# Patient Record
Sex: Female | Born: 1955
Health system: Southern US, Community
[De-identification: ages and names within clinical notes are randomized; demographics above are authoritative.]

## PROBLEM LIST (undated history)

## (undated) DIAGNOSIS — F329 Major depressive disorder, single episode, unspecified: Secondary | ICD-10-CM

## (undated) DIAGNOSIS — R51 Headache: Secondary | ICD-10-CM

## (undated) DIAGNOSIS — Z9889 Other specified postprocedural states: Secondary | ICD-10-CM

## (undated) DIAGNOSIS — F419 Anxiety disorder, unspecified: Secondary | ICD-10-CM

## (undated) DIAGNOSIS — Z9861 Coronary angioplasty status: Principal | ICD-10-CM

## (undated) DIAGNOSIS — Z8489 Family history of other specified conditions: Secondary | ICD-10-CM

## (undated) DIAGNOSIS — J381 Polyp of vocal cord and larynx: Secondary | ICD-10-CM

## (undated) DIAGNOSIS — E785 Hyperlipidemia, unspecified: Secondary | ICD-10-CM

## (undated) DIAGNOSIS — T4145XA Adverse effect of unspecified anesthetic, initial encounter: Secondary | ICD-10-CM

## (undated) DIAGNOSIS — R112 Nausea with vomiting, unspecified: Secondary | ICD-10-CM

## (undated) DIAGNOSIS — K219 Gastro-esophageal reflux disease without esophagitis: Secondary | ICD-10-CM

## (undated) DIAGNOSIS — G4700 Insomnia, unspecified: Secondary | ICD-10-CM

## (undated) DIAGNOSIS — T8859XA Other complications of anesthesia, initial encounter: Secondary | ICD-10-CM

## (undated) DIAGNOSIS — I2510 Atherosclerotic heart disease of native coronary artery without angina pectoris: Secondary | ICD-10-CM

## (undated) DIAGNOSIS — M1990 Unspecified osteoarthritis, unspecified site: Secondary | ICD-10-CM

## (undated) DIAGNOSIS — R519 Headache, unspecified: Secondary | ICD-10-CM

## (undated) DIAGNOSIS — D509 Iron deficiency anemia, unspecified: Secondary | ICD-10-CM

## (undated) DIAGNOSIS — G629 Polyneuropathy, unspecified: Secondary | ICD-10-CM

## (undated) DIAGNOSIS — I10 Essential (primary) hypertension: Secondary | ICD-10-CM

## (undated) DIAGNOSIS — I2119 ST elevation (STEMI) myocardial infarction involving other coronary artery of inferior wall: Secondary | ICD-10-CM

## (undated) HISTORY — DX: Insomnia, unspecified: G47.00

## (undated) HISTORY — DX: Essential (primary) hypertension: I10

## (undated) HISTORY — DX: Hyperlipidemia, unspecified: E78.5

## (undated) HISTORY — DX: ST elevation (STEMI) myocardial infarction involving other coronary artery of inferior wall: I21.19

## (undated) HISTORY — PX: BACK SURGERY: SHX140

## (undated) HISTORY — DX: Polyp of vocal cord and larynx: J38.1

## (undated) HISTORY — PX: JOINT REPLACEMENT: SHX530

## (undated) HISTORY — DX: Coronary angioplasty status: Z98.61

## (undated) HISTORY — PX: ABDOMINAL HYSTERECTOMY: SHX81

## (undated) HISTORY — DX: Atherosclerotic heart disease of native coronary artery without angina pectoris: I25.10

## (undated) HISTORY — PX: EYE SURGERY: SHX253

## (undated) HISTORY — PX: BREAST EXCISIONAL BIOPSY: SUR124

---

## 1998-03-25 HISTORY — PX: CHOLECYSTECTOMY: SHX55

## 1999-01-31 HISTORY — PX: BREAST BIOPSY: SHX20

## 1999-09-11 HISTORY — PX: OTHER SURGICAL HISTORY: SHX169

## 2000-07-04 HISTORY — PX: APPENDECTOMY: SHX54

## 2003-02-22 HISTORY — PX: UPPER GI ENDOSCOPY: SHX6162

## 2003-04-10 DIAGNOSIS — J381 Polyp of vocal cord and larynx: Secondary | ICD-10-CM

## 2003-04-10 HISTORY — DX: Polyp of vocal cord and larynx: J38.1

## 2003-04-28 ENCOUNTER — Other Ambulatory Visit: Payer: Self-pay

## 2004-01-12 ENCOUNTER — Ambulatory Visit: Payer: Self-pay | Admitting: General Surgery

## 2004-01-14 ENCOUNTER — Ambulatory Visit: Payer: Self-pay | Admitting: General Surgery

## 2004-09-07 ENCOUNTER — Ambulatory Visit: Payer: Self-pay | Admitting: Physician Assistant

## 2004-09-20 ENCOUNTER — Other Ambulatory Visit: Payer: Self-pay

## 2004-09-21 ENCOUNTER — Inpatient Hospital Stay: Payer: Self-pay | Admitting: Unknown Physician Specialty

## 2005-01-10 ENCOUNTER — Ambulatory Visit: Payer: Self-pay | Admitting: General Surgery

## 2006-01-11 ENCOUNTER — Ambulatory Visit: Payer: Self-pay | Admitting: General Surgery

## 2007-02-11 ENCOUNTER — Ambulatory Visit: Payer: Self-pay | Admitting: General Surgery

## 2007-11-03 ENCOUNTER — Emergency Department: Payer: Self-pay | Admitting: Emergency Medicine

## 2007-11-04 ENCOUNTER — Emergency Department: Payer: Self-pay | Admitting: Emergency Medicine

## 2008-02-11 ENCOUNTER — Ambulatory Visit: Payer: Self-pay | Admitting: General Surgery

## 2008-04-09 HISTORY — PX: KNEE SURGERY: SHX244

## 2008-12-14 DIAGNOSIS — IMO0002 Reserved for concepts with insufficient information to code with codable children: Secondary | ICD-10-CM | POA: Insufficient documentation

## 2008-12-14 DIAGNOSIS — M9979 Connective tissue and disc stenosis of intervertebral foramina of abdomen and other regions: Secondary | ICD-10-CM | POA: Insufficient documentation

## 2009-01-05 DIAGNOSIS — E7800 Pure hypercholesterolemia, unspecified: Secondary | ICD-10-CM | POA: Insufficient documentation

## 2009-01-05 DIAGNOSIS — I10 Essential (primary) hypertension: Secondary | ICD-10-CM | POA: Insufficient documentation

## 2009-01-05 DIAGNOSIS — K219 Gastro-esophageal reflux disease without esophagitis: Secondary | ICD-10-CM | POA: Insufficient documentation

## 2009-01-05 DIAGNOSIS — Z0000 Encounter for general adult medical examination without abnormal findings: Secondary | ICD-10-CM | POA: Insufficient documentation

## 2009-02-11 ENCOUNTER — Ambulatory Visit: Payer: Self-pay | Admitting: General Surgery

## 2009-02-28 ENCOUNTER — Ambulatory Visit: Payer: Self-pay | Admitting: Unknown Physician Specialty

## 2009-03-07 DIAGNOSIS — L080 Pyoderma: Secondary | ICD-10-CM | POA: Insufficient documentation

## 2009-03-10 ENCOUNTER — Ambulatory Visit: Payer: Self-pay | Admitting: Unknown Physician Specialty

## 2009-03-17 ENCOUNTER — Ambulatory Visit: Payer: Self-pay | Admitting: Unknown Physician Specialty

## 2009-09-19 DIAGNOSIS — L03319 Cellulitis of trunk, unspecified: Secondary | ICD-10-CM | POA: Insufficient documentation

## 2009-10-15 DIAGNOSIS — B999 Unspecified infectious disease: Secondary | ICD-10-CM | POA: Insufficient documentation

## 2010-04-09 DIAGNOSIS — E785 Hyperlipidemia, unspecified: Secondary | ICD-10-CM

## 2010-04-09 HISTORY — DX: Hyperlipidemia, unspecified: E78.5

## 2011-03-07 ENCOUNTER — Other Ambulatory Visit: Payer: Self-pay

## 2011-03-07 ENCOUNTER — Encounter: Payer: Self-pay | Admitting: *Deleted

## 2011-03-07 ENCOUNTER — Inpatient Hospital Stay (HOSPITAL_COMMUNITY)
Admission: EM | Admit: 2011-03-07 | Discharge: 2011-03-11 | DRG: 853 | Disposition: A | Discharge status: Home / Self Care | Payer: BC Managed Care – PPO | Source: Ambulatory Visit | Attending: Cardiology | Admitting: Cardiology

## 2011-03-07 ENCOUNTER — Emergency Department (HOSPITAL_COMMUNITY): Payer: BC Managed Care – PPO

## 2011-03-07 ENCOUNTER — Encounter (HOSPITAL_COMMUNITY)
Admission: EM | Disposition: A | Discharge status: Home / Self Care | Payer: Self-pay | Source: Ambulatory Visit | Attending: Cardiology

## 2011-03-07 DIAGNOSIS — I2510 Atherosclerotic heart disease of native coronary artery without angina pectoris: Secondary | ICD-10-CM | POA: Diagnosis present

## 2011-03-07 DIAGNOSIS — I2119 ST elevation (STEMI) myocardial infarction involving other coronary artery of inferior wall: Principal | ICD-10-CM | POA: Diagnosis present

## 2011-03-07 DIAGNOSIS — F32A Depression, unspecified: Secondary | ICD-10-CM | POA: Diagnosis present

## 2011-03-07 DIAGNOSIS — I10 Essential (primary) hypertension: Secondary | ICD-10-CM | POA: Diagnosis present

## 2011-03-07 DIAGNOSIS — F329 Major depressive disorder, single episode, unspecified: Secondary | ICD-10-CM | POA: Diagnosis present

## 2011-03-07 DIAGNOSIS — Z862 Personal history of diseases of the blood and blood-forming organs and certain disorders involving the immune mechanism: Secondary | ICD-10-CM | POA: Diagnosis present

## 2011-03-07 DIAGNOSIS — E785 Hyperlipidemia, unspecified: Secondary | ICD-10-CM | POA: Diagnosis present

## 2011-03-07 DIAGNOSIS — D509 Iron deficiency anemia, unspecified: Secondary | ICD-10-CM | POA: Diagnosis present

## 2011-03-07 DIAGNOSIS — I219 Acute myocardial infarction, unspecified: Secondary | ICD-10-CM | POA: Insufficient documentation

## 2011-03-07 DIAGNOSIS — F17200 Nicotine dependence, unspecified, uncomplicated: Secondary | ICD-10-CM | POA: Diagnosis present

## 2011-03-07 DIAGNOSIS — I213 ST elevation (STEMI) myocardial infarction of unspecified site: Secondary | ICD-10-CM

## 2011-03-07 DIAGNOSIS — Z23 Encounter for immunization: Secondary | ICD-10-CM

## 2011-03-07 DIAGNOSIS — F3289 Other specified depressive episodes: Secondary | ICD-10-CM | POA: Diagnosis present

## 2011-03-07 DIAGNOSIS — Z955 Presence of coronary angioplasty implant and graft: Secondary | ICD-10-CM

## 2011-03-07 HISTORY — DX: Major depressive disorder, single episode, unspecified: F32.9

## 2011-03-07 HISTORY — DX: Unspecified osteoarthritis, unspecified site: M19.90

## 2011-03-07 HISTORY — DX: Gastro-esophageal reflux disease without esophagitis: K21.9

## 2011-03-07 HISTORY — DX: Atherosclerotic heart disease of native coronary artery without angina pectoris: I25.10

## 2011-03-07 HISTORY — DX: Polyneuropathy, unspecified: G62.9

## 2011-03-07 HISTORY — PX: CORONARY ANGIOPLASTY WITH STENT PLACEMENT: SHX49

## 2011-03-07 HISTORY — DX: Anxiety disorder, unspecified: F41.9

## 2011-03-07 HISTORY — DX: Other specified postprocedural states: Z98.890

## 2011-03-07 HISTORY — PX: LEFT HEART CATHETERIZATION WITH CORONARY ANGIOGRAM: SHX5451

## 2011-03-07 HISTORY — DX: ST elevation (STEMI) myocardial infarction involving other coronary artery of inferior wall: I21.19

## 2011-03-07 HISTORY — DX: Iron deficiency anemia, unspecified: D50.9

## 2011-03-07 HISTORY — DX: Essential (primary) hypertension: I10

## 2011-03-07 SURGERY — LEFT HEART CATHETERIZATION WITH CORONARY ANGIOGRAM
Anesthesia: LOCAL

## 2011-03-07 MED ORDER — MIDAZOLAM HCL 2 MG/2ML IJ SOLN
INTRAMUSCULAR | Status: AC
  Filled 2011-03-07: qty 2

## 2011-03-07 MED ORDER — NITROGLYCERIN 0.2 MG/ML ON CALL CATH LAB
INTRAVENOUS | Status: AC
  Filled 2011-03-07: qty 1

## 2011-03-07 MED ORDER — HEPARIN SODIUM (PORCINE) 5000 UNIT/ML IJ SOLN
4000.0000 [IU] | INTRAMUSCULAR | Status: AC
  Administered 2011-03-07: 4000 [IU] via INTRAVENOUS

## 2011-03-07 MED ORDER — FENTANYL CITRATE 0.05 MG/ML IJ SOLN
INTRAMUSCULAR | Status: AC
  Filled 2011-03-07: qty 2

## 2011-03-07 MED ORDER — HEPARIN SODIUM (PORCINE) 5000 UNIT/ML IJ SOLN
INTRAMUSCULAR | Status: AC
  Filled 2011-03-07: qty 1

## 2011-03-07 MED ORDER — LIDOCAINE HCL (PF) 1 % IJ SOLN
INTRAMUSCULAR | Status: AC
  Filled 2011-03-07: qty 30

## 2011-03-07 MED ORDER — PRASUGREL HCL 10 MG PO TABS
ORAL_TABLET | ORAL | Status: AC
  Filled 2011-03-07: qty 6

## 2011-03-07 MED ORDER — BIVALIRUDIN 250 MG IV SOLR
INTRAVENOUS | Status: AC
  Filled 2011-03-07: qty 250

## 2011-03-07 MED ORDER — HEPARIN (PORCINE) IN NACL 2-0.9 UNIT/ML-% IJ SOLN
INTRAMUSCULAR | Status: AC
  Filled 2011-03-07: qty 2000

## 2011-03-07 MED ORDER — SODIUM CHLORIDE 0.9 % IV SOLN
30.0000 mL | INTRAVENOUS | Status: DC
  Administered 2011-03-07: 30 mL via INTRAVENOUS

## 2011-03-07 NOTE — ED Notes (Signed)
Pt transported to cath lab at this time.

## 2011-03-07 NOTE — H&P (Signed)
Jessica Landry is an 55 y.o. female.   Chief Complaint: Chest pain. HPI: 55 year old white, married, female presents from [facility] with STEMI.  With EKG changes II,III,AVF and reciprocal changes I, AVL, V2 & V3. Brought emergently to the cath lab for rescue PTCA/Stent.  No prior history of cardiac disease.  Chest pain began @ 2030 after feeding the dogs.  Initially diaphoretic, then  Developed nausea and SOB.   EMS was called once her significant other arrived home and the chest pain had increased in severity.    Other PMH: spinal surgery.  Past Medical History  Diagnosis Date  . Hypertension   . STEMI (ST elevation myocardial infarction) 03/07/2011  . HTN (hypertension) 03/07/2011  . Neuropathy     Past Surgical History  Procedure Date  . Cholecystectomy   . Abdominal hysterectomy     Family History  Problem Relation Age of Onset  . Heart attack Mother   . Coronary artery disease Mother    Social History:  reports that she has been smoking.  She does not have any smokeless tobacco history on file. She reports that she drinks alcohol. She reports that she does not use illicit drugs. She is to have divorce this week.  Currently lives with significant other.  No children.  Allergies:  Allergies  Allergen Reactions  . Sulfa Antibiotics Other (See Comments)    unknown    Medications Prior to Admission  Medication Dose Route Frequency Provider Last Rate Last Dose  . 0.9 %  sodium chloride infusion  30 mL Intravenous Continuous Vida Roller, MD 150 mL/hr at 03/07/11 2307 30 mL at 03/07/11 2307  . bivalirudin (ANGIOMAX) 250 MG injection           . fentaNYL (SUBLIMAZE) 0.05 MG/ML injection           . heparin 2-0.9 UNIT/ML-% infusion           . heparin injection 60 Units/kg  60 Units/kg Intravenous STAT Vida Roller, MD   4,000 Units at 03/07/11 2307  . lidocaine (XYLOCAINE) 1 % injection           . midazolam (VERSED) 2 MG/2ML injection           . nitroGLYCERIN  (NTG ON-CALL) 0.2 mg/mL injection            No current outpatient prescriptions on file as of 03/07/2011.    No results found for this or any previous visit (from the past 48 hour(s)). No results found.  ROS: General: No colds or fevers. Skin:No rashes or ulcers. HEENT: No blurred vision. Cardiovascular:No chest pain until tonight, despite regular exercise. Endocrine:no DM, No thyroid disease. Pulmonary: tobacco use, used to smoke 2ppd now down to half a pack. GI:no diarrhea, constipation, no melena. GU:No dysuria, no hematuria. ZO:XWRU pain, with spinal surgery Neuro:No syncope. PSYCH:  Hx. Of depression, treated.  Blood pressure 152/101, pulse 86, resp. rate 20, height 5\' 8"  (1.727 m), weight 79.379 kg (175 lb), SpO2 99.00%. PE:  GENERAL:Alert, oriented WF, Sleepy from medications, but pleasant affect. SKIN:W & D, brisk capillary refill. HEENT:normocephalic, sclera clear. NECK:supple, no jvd, no carotid bruits. HEART:S1S2, RRR. No obvious Murmur, gallup, rub or click.  LUNGS:clear ant., without rales or wheezes. ABD:+ BS, soft, non tender. EXT:Rt. Groin with sheath, no lower ext. Edema.  1+ pedal pulses bil. NEURO:A & O X 3, MAE, follows commands.    Assessment/Plan Patient Active Problem List  Diagnoses  .  STEMI (ST elevation myocardial infarction)  . HTN (hypertension)    PLAN:  To cath lab emergently. Will admit to 2900 A.  Serial cardiac enzymes, check CXR.  Serial EKGS.  Changed Atenolol to lopressor, is on Crestor, will continue.  Will add low dose ACE.   INGOLD,LAURA R 03/07/2011, 11:45 PM  [name] presented with acute onset lower substernal chest pain that began at ~2030hrs that she though was indigestion.  Sx got progressively worse over the next few hrs.  Called [facility] - who checked ECG on the scene - noted Inferior STEMI.  Code STEMI called.  Paged @[phone] .   Arrived @ [facility] - 22:58 --> cath lab 23:13 with >75mm STE in II,III,aVF with reciprocal  ST depressions c/w Inferior STEMI.  Upon arrival was hypertensive - ~200s/100s with HR ~110s.  10/10 chest pain & nausea. Minimal LCA disease (very small non-dominant LCx.  Mid RCA 100% occluded - PCTA - Promus DES 3.5 mm x 28 mm (post dilated to 3.75 mm distally & 4.12 proximally).  Reperfusion vasovagal response with drop in SBP to 60s & HR to 50s -- bolus given & BP in ~100s upon transfer to CCU in stable condition on Fast-track status.  The patient was examined post-procedure.  I agree with the H&P as outlined by Nada Boozer, FNP.  Marykay Lex, M.D., M.S. [facility] [address] [place], [place]  [address]  [phone]  03/08/2011 1:54 AM

## 2011-03-07 NOTE — ED Notes (Signed)
Per EMS - pt from home - c/o substernal chest pain that began 20:00 after walking her dog - pt w/o hx of cardiac hx

## 2011-03-07 NOTE — ED Provider Notes (Signed)
History     CSN: [id number] Arrival date & time: 03/07/2011 10:58 PM   First MD Initiated Contact with Patient 03/07/11 2304      Chief Complaint  Patient presents with  . Chest Pain    (Consider location/radiation/quality/duration/timing/severity/associated sxs/prior treatment) Patient is a 55 y.o. female presenting with chest pain. The history is provided by the patient and the EMS personnel.  Chest Pain The chest pain began 3 - 5 hours ago. Duration of episode(s) is 3 hours. Chest pain occurs constantly. The chest pain is improving. Associated with: nothing. The pain is currently at 7/10. The severity of the pain is moderate. The quality of the pain is described as heavy. The pain does not radiate. Exacerbated by: nothing. Primary symptoms include nausea. Pertinent negatives for primary symptoms include no shortness of breath.  Pertinent negatives for associated symptoms include no diaphoresis, no lower extremity edema, no near-syncope, no numbness, no orthopnea and no weakness. Treatments tried: EMS gave ASA, nitro, fentanyl. Risk factors: hypertension, smoking.     Past Medical History  Diagnosis Date  . Hypertension     Past Surgical History  Procedure Date  . Cholecystectomy   . Abdominal hysterectomy     Family History  Problem Relation Age of Onset  . Heart attack Mother     History  Substance Use Topics  . Smoking status: Current Some Day Smoker  . Smokeless tobacco: Not on file  . Alcohol Use: Yes     occasional    OB History    Grav Para Term Preterm Abortions TAB SAB Ect Mult Living                  Review of Systems  Constitutional: Negative for diaphoresis.  Respiratory: Negative for shortness of breath.   Cardiovascular: Positive for chest pain. Negative for orthopnea and near-syncope.  Gastrointestinal: Positive for nausea.  Neurological: Negative for weakness and numbness.  All other systems reviewed and are negative.    Allergies    Sulfa antibiotics  Home Medications  No current outpatient prescriptions on file.  There were no vitals taken for this visit.  Physical Exam  Nursing note and vitals reviewed. Constitutional: She appears well-developed and well-nourished. She appears distressed.       Uncomfortable appearing   HENT:  Head: Normocephalic and atraumatic.  Mouth/Throat: Oropharynx is clear and moist. No oropharyngeal exudate.  Eyes: Conjunctivae and EOM are normal. Pupils are equal, round, and reactive to light. Right eye exhibits no discharge. Left eye exhibits no discharge. No scleral icterus.  Neck: Normal range of motion. Neck supple. No JVD present. No thyromegaly present.  Cardiovascular: Normal rate, regular rhythm, normal heart sounds and intact distal pulses.  Exam reveals no gallop and no friction rub.   No murmur heard. Pulmonary/Chest: Effort normal and breath sounds normal. No respiratory distress. She has no wheezes. She has no rales.  Abdominal: Soft. Bowel sounds are normal. She exhibits no distension and no mass. There is no tenderness.  Musculoskeletal: Normal range of motion. She exhibits no edema and no tenderness.  Lymphadenopathy:    She has no cervical adenopathy.  Neurological: She is alert. Coordination normal.  Skin: Skin is warm and dry. No rash noted. No erythema.  Psychiatric: She has a normal mood and affect. Her behavior is normal.    ED Course  Procedures (including critical care time)  Labs Reviewed - No data to display No results found.   No diagnosis found.  MDM  STEMI - code activated from field, Cardiology has been paged prior to pt arrival - confirmed ECG on our tracings.  ASA given pta, heparin ordered, pt uncomfortable.  ED ECG REPORT   Date: 03/07/2011   Rate: 88  Rhythm: normal sinus rhythm  QRS Axis: normal  Intervals: normal  ST/T Wave abnormalities: ST elevations inferiorly  Conduction Disutrbances:none  Narrative Interpretation:   Old  EKG Reviewed: none available  Pt has left ED at this time.  totatl time in ED 11 minutes.       Vida Roller, MD 03/07/11 [phone]

## 2011-03-08 ENCOUNTER — Encounter (HOSPITAL_COMMUNITY)
Admission: EM | Disposition: A | Discharge status: Home / Self Care | Payer: Self-pay | Source: Ambulatory Visit | Attending: Cardiology

## 2011-03-08 ENCOUNTER — Encounter (HOSPITAL_COMMUNITY): Payer: Self-pay | Admitting: Cardiology

## 2011-03-08 ENCOUNTER — Other Ambulatory Visit: Payer: Self-pay

## 2011-03-08 ENCOUNTER — Inpatient Hospital Stay (HOSPITAL_COMMUNITY): Payer: BC Managed Care – PPO

## 2011-03-08 DIAGNOSIS — I2510 Atherosclerotic heart disease of native coronary artery without angina pectoris: Secondary | ICD-10-CM | POA: Diagnosis present

## 2011-03-08 DIAGNOSIS — F32A Depression, unspecified: Secondary | ICD-10-CM | POA: Diagnosis present

## 2011-03-08 DIAGNOSIS — E785 Hyperlipidemia, unspecified: Secondary | ICD-10-CM | POA: Diagnosis present

## 2011-03-08 DIAGNOSIS — Z9889 Other specified postprocedural states: Secondary | ICD-10-CM | POA: Insufficient documentation

## 2011-03-08 DIAGNOSIS — F329 Major depressive disorder, single episode, unspecified: Secondary | ICD-10-CM | POA: Diagnosis present

## 2011-03-08 HISTORY — DX: Depression, unspecified: F32.A

## 2011-03-08 HISTORY — PX: LEFT HEART CATH AND CORONARY ANGIOGRAPHY: CATH118249

## 2011-03-08 HISTORY — PX: LEFT HEART CATHETERIZATION WITH CORONARY ANGIOGRAM: SHX5451

## 2011-03-08 HISTORY — PX: CARDIAC CATHETERIZATION: SHX172

## 2011-03-08 LAB — COMPREHENSIVE METABOLIC PANEL
ALT: 15 U/L (ref 0–35)
AST: 13 U/L (ref 0–37)
AST: 70 U/L — ABNORMAL HIGH (ref 0–37)
Alkaline Phosphatase: 91 U/L (ref 39–117)
Alkaline Phosphatase: 94 U/L (ref 39–117)
BUN: 12 mg/dL (ref 6–23)
CO2: 22 mEq/L (ref 19–32)
CO2: 26 mEq/L (ref 19–32)
Calcium: 9.1 mg/dL (ref 8.4–10.5)
Chloride: 94 mEq/L — ABNORMAL LOW (ref 96–112)
Chloride: 101 mEq/L (ref 96–112)
Creatinine, Ser: 0.65 mg/dL (ref 0.50–1.10)
GFR calc Af Amer: >90 mL/min (ref >90)
GFR calc non Af Amer: >90 mL/min (ref >90)
GFR calc non Af Amer: >90 mL/min (ref >90)
Glucose, Bld: 105 mg/dL — ABNORMAL HIGH (ref 70–99)
Sodium: 136 mEq/L (ref 135–145)
Total Bilirubin: 0.1 mg/dL — ABNORMAL LOW (ref 0.3–1.2)
Total Bilirubin: 0.2 mg/dL — ABNORMAL LOW (ref 0.3–1.2)

## 2011-03-08 LAB — BASIC METABOLIC PANEL
Chloride: 98 mEq/L (ref 96–112)
Creatinine, Ser: 0.67 mg/dL (ref 0.50–1.10)
GFR calc Af Amer: >90 mL/min (ref >90)
GFR calc non Af Amer: >90 mL/min (ref >90)
Potassium: 4 mEq/L (ref 3.5–5.1)

## 2011-03-08 LAB — CBC
HCT: 31.5 % — ABNORMAL LOW (ref 36.0–46.0)
MCV: 87.3 fL (ref 78.0–100.0)
MCV: 87.6 fL (ref 78.0–100.0)
Platelets: 273 10*3/uL (ref 150–400)
Platelets: 268 10*3/uL (ref 150–400)
RBC: 3.61 MIL/uL — ABNORMAL LOW (ref 3.87–5.11)
RDW: 14.2 % (ref 11.5–15.5)
WBC: 9.7 10*3/uL (ref 4.0–10.5)
WBC: 9.1 10*3/uL (ref 4.0–10.5)

## 2011-03-08 LAB — URINALYSIS, ROUTINE W REFLEX MICROSCOPIC
Bilirubin Urine: NEGATIVE
Glucose, UA: NEGATIVE mg/dL
Ketones, ur: NEGATIVE mg/dL
Protein, ur: NEGATIVE mg/dL
pH: 6 (ref 5.0–8.0)

## 2011-03-08 LAB — CARDIAC PANEL(CRET KIN+CKTOT+MB+TROPI)
CK, MB: 110.2 ng/mL (ref 0.3–4.0)
Relative Index: 9.8 — ABNORMAL HIGH (ref 0.0–2.5)
Relative Index: 11.3 — ABNORMAL HIGH (ref 0.0–2.5)
Relative Index: 11.1 — ABNORMAL HIGH (ref 0.0–2.5)
Total CK: 517 U/L — ABNORMAL HIGH (ref 7–177)
Troponin I: >25 ng/mL (ref <0.30)

## 2011-03-08 LAB — LIPID PANEL
Cholesterol: 266 mg/dL — ABNORMAL HIGH (ref 0–200)
LDL Cholesterol: 169 mg/dL — ABNORMAL HIGH (ref 0–99)
VLDL: 23 mg/dL (ref 0–40)

## 2011-03-08 LAB — IRON AND TIBC
Saturation Ratios: 6 % — ABNORMAL LOW (ref 20–55)
TIBC: 386 ug/dL (ref 250–470)
UIBC: 362 ug/dL (ref 125–400)

## 2011-03-08 LAB — PROTIME-INR
INR: 6.81 (ref 0.00–1.49)
INR: 1.52 — ABNORMAL HIGH (ref 0.00–1.49)

## 2011-03-08 LAB — MRSA PCR SCREENING: MRSA by PCR: NEGATIVE

## 2011-03-08 LAB — VITAMIN B12: Vitamin B-12: 308 pg/mL (ref 211–911)

## 2011-03-08 LAB — FOLATE: Folate: 5.2 ng/mL

## 2011-03-08 SURGERY — LEFT HEART CATHETERIZATION WITH CORONARY ANGIOGRAM
Anesthesia: LOCAL

## 2011-03-08 MED ORDER — ASPIRIN EC 81 MG PO TBEC
81.0000 mg | DELAYED_RELEASE_TABLET | Freq: Every day | ORAL | Status: DC
  Filled 2011-03-08: qty 1

## 2011-03-08 MED ORDER — SODIUM CHLORIDE 0.9 % IV SOLN: 250.0000 mL | INTRAVENOUS | Status: DC | PRN

## 2011-03-08 MED ORDER — LIDOCAINE HCL (PF) 1 % IJ SOLN
INTRAMUSCULAR | Status: AC
  Filled 2011-03-08: qty 30

## 2011-03-08 MED ORDER — VENLAFAXINE HCL ER 150 MG PO CP24
150.0000 mg | ORAL_CAPSULE | Freq: Every day | ORAL | Status: DC
  Administered 2011-03-09: 150 mg via ORAL
  Filled 2011-03-08 (×2): qty 1

## 2011-03-08 MED ORDER — MIDAZOLAM HCL 2 MG/2ML IJ SOLN
INTRAMUSCULAR | Status: AC
  Filled 2011-03-08: qty 2

## 2011-03-08 MED ORDER — SODIUM CHLORIDE 0.9 % IV SOLN: 250.0000 mL | INTRAVENOUS | Status: DC

## 2011-03-08 MED ORDER — POLYSACCHARIDE IRON 150 MG PO CAPS
150.0000 mg | ORAL_CAPSULE | Freq: Every day | ORAL | Status: DC
  Administered 2011-03-08 – 2011-03-11 (×4): 150 mg via ORAL
  Filled 2011-03-08 (×4): qty 1

## 2011-03-08 MED ORDER — POTASSIUM CHLORIDE CRYS ER 20 MEQ PO TBCR
40.0000 meq | EXTENDED_RELEASE_TABLET | Freq: Once | ORAL | Status: AC
  Administered 2011-03-08: 40 meq via ORAL
  Filled 2011-03-08: qty 2

## 2011-03-08 MED ORDER — ALPRAZOLAM 0.25 MG PO TABS
0.2500 mg | ORAL_TABLET | Freq: Two times a day (BID) | ORAL | Status: DC | PRN
  Administered 2011-03-08 – 2011-03-10 (×5): 0.25 mg via ORAL
  Filled 2011-03-08 (×5): qty 1

## 2011-03-08 MED ORDER — NITROGLYCERIN 0.2 MG/ML ON CALL CATH LAB
INTRAVENOUS | Status: AC
  Filled 2011-03-08: qty 1

## 2011-03-08 MED ORDER — HEPARIN SOD (PORCINE) IN D5W 100 UNIT/ML IV SOLN: 1100.0000 [IU]/h | INTRAVENOUS | Status: DC

## 2011-03-08 MED ORDER — METOPROLOL TARTRATE 1 MG/ML IV SOLN
INTRAVENOUS | Status: AC
  Administered 2011-03-08: 2.5 mg via INTRAVENOUS
  Filled 2011-03-08: qty 5

## 2011-03-08 MED ORDER — SODIUM CHLORIDE 0.9 % IV SOLN: INTRAVENOUS | Status: DC

## 2011-03-08 MED ORDER — NITROGLYCERIN IN D5W 200-5 MCG/ML-% IV SOLN
5.0000 ug/min | INTRAVENOUS | Status: DC
  Administered 2011-03-08: 5 ug/min via INTRAVENOUS

## 2011-03-08 MED ORDER — PANTOPRAZOLE SODIUM 40 MG PO TBEC
40.0000 mg | DELAYED_RELEASE_TABLET | Freq: Every day | ORAL | Status: DC
  Administered 2011-03-08 – 2011-03-11 (×4): 40 mg via ORAL
  Filled 2011-03-08 (×2): qty 1

## 2011-03-08 MED ORDER — ONDANSETRON HCL 4 MG/2ML IJ SOLN
4.0000 mg | Freq: Four times a day (QID) | INTRAMUSCULAR | Status: DC | PRN
  Administered 2011-03-08: 4 mg via INTRAVENOUS
  Filled 2011-03-08: qty 2

## 2011-03-08 MED ORDER — ROSUVASTATIN CALCIUM 5 MG PO TABS
5.0000 mg | ORAL_TABLET | ORAL | Status: DC
  Filled 2011-03-08: qty 1

## 2011-03-08 MED ORDER — PRASUGREL HCL 10 MG PO TABS
10.0000 mg | ORAL_TABLET | Freq: Every day | ORAL | Status: DC
  Administered 2011-03-09: 10 mg via ORAL
  Filled 2011-03-08: qty 1

## 2011-03-08 MED ORDER — SODIUM CHLORIDE 0.9 % IV SOLN
INTRAVENOUS | Status: DC
  Administered 2011-03-08: 100 mL/h via INTRAVENOUS

## 2011-03-08 MED ORDER — SODIUM CHLORIDE 0.9 % IJ SOLN: 3.0000 mL | INTRAMUSCULAR | Status: DC | PRN

## 2011-03-08 MED ORDER — MORPHINE SULFATE 2 MG/ML IJ SOLN: 1.0000 mg | INTRAMUSCULAR | Status: DC | PRN

## 2011-03-08 MED ORDER — ONDANSETRON HCL 4 MG/2ML IJ SOLN: 4.0000 mg | Freq: Four times a day (QID) | INTRAMUSCULAR | Status: DC | PRN

## 2011-03-08 MED ORDER — SODIUM CHLORIDE 0.9 % IV SOLN
INTRAVENOUS | Status: AC
  Administered 2011-03-08: 12:00:00 via INTRAVENOUS

## 2011-03-08 MED ORDER — FENTANYL CITRATE 0.05 MG/ML IJ SOLN: 25.0000 ug | Freq: Once | INTRAMUSCULAR | Status: DC

## 2011-03-08 MED ORDER — ACETAMINOPHEN 325 MG PO TABS: 650.0000 mg | ORAL_TABLET | ORAL | Status: DC | PRN

## 2011-03-08 MED ORDER — ASPIRIN 300 MG RE SUPP: 300.0000 mg | RECTAL | Status: AC

## 2011-03-08 MED ORDER — METOPROLOL TARTRATE 25 MG PO TABS
25.0000 mg | ORAL_TABLET | Freq: Two times a day (BID) | ORAL | Status: DC
  Administered 2011-03-08 – 2011-03-11 (×8): 25 mg via ORAL
  Filled 2011-03-08 (×9): qty 1

## 2011-03-08 MED ORDER — DIAZEPAM 2 MG PO TABS: 2.0000 mg | ORAL_TABLET | ORAL | Status: DC | PRN

## 2011-03-08 MED ORDER — SODIUM CHLORIDE 0.9 % IJ SOLN
3.0000 mL | Freq: Two times a day (BID) | INTRAMUSCULAR | Status: DC
  Administered 2011-03-08 – 2011-03-10 (×6): 3 mL via INTRAVENOUS

## 2011-03-08 MED ORDER — SODIUM CHLORIDE 0.9 % IJ SOLN: 3.0000 mL | Freq: Two times a day (BID) | INTRAMUSCULAR | Status: DC

## 2011-03-08 MED ORDER — ROSUVASTATIN CALCIUM 5 MG PO TABS
5.0000 mg | ORAL_TABLET | ORAL | Status: DC
  Administered 2011-03-09: 5 mg via ORAL
  Filled 2011-03-08 (×2): qty 1

## 2011-03-08 MED ORDER — ASPIRIN 81 MG PO CHEW: 324.0000 mg | CHEWABLE_TABLET | ORAL | Status: DC

## 2011-03-08 MED ORDER — BUPROPION HCL ER (XL) 150 MG PO TB24
150.0000 mg | ORAL_TABLET | Freq: Every day | ORAL | Status: DC
  Filled 2011-03-08: qty 1

## 2011-03-08 MED ORDER — BUPROPION HCL ER (XL) 150 MG PO TB24
150.0000 mg | ORAL_TABLET | Freq: Every day | ORAL | Status: DC
  Administered 2011-03-08 – 2011-03-11 (×4): 150 mg via ORAL
  Filled 2011-03-08 (×4): qty 1

## 2011-03-08 MED ORDER — VENLAFAXINE HCL ER 150 MG PO CP24
150.0000 mg | ORAL_CAPSULE | Freq: Every day | ORAL | Status: DC
  Filled 2011-03-08: qty 1

## 2011-03-08 MED ORDER — ASPIRIN 81 MG PO CHEW
324.0000 mg | CHEWABLE_TABLET | ORAL | Status: AC
  Administered 2011-03-08: 324 mg via ORAL
  Filled 2011-03-08: qty 4

## 2011-03-08 MED ORDER — PRASUGREL HCL 10 MG PO TABS: 10.0000 mg | ORAL_TABLET | ORAL | Status: DC

## 2011-03-08 MED ORDER — OXYCODONE-ACETAMINOPHEN 5-325 MG PO TABS: 1.0000 | ORAL_TABLET | ORAL | Status: DC | PRN

## 2011-03-08 MED ORDER — INFLUENZA VIRUS VACC SPLIT PF IM SUSP
0.5000 mL | INTRAMUSCULAR | Status: AC
  Administered 2011-03-08: 0.5 mL via INTRAMUSCULAR
  Filled 2011-03-08 (×2): qty 0.5

## 2011-03-08 MED ORDER — GABAPENTIN 300 MG PO CAPS
300.0000 mg | ORAL_CAPSULE | Freq: Two times a day (BID) | ORAL | Status: DC
Start: 2011-03-08 — End: 2011-03-08
  Filled 2011-03-08 (×2): qty 1

## 2011-03-08 MED ORDER — NITROGLYCERIN 0.4 MG SL SUBL
0.4000 mg | SUBLINGUAL_TABLET | SUBLINGUAL | Status: DC | PRN
  Administered 2011-03-08 – 2011-03-10 (×3): 0.4 mg via SUBLINGUAL
  Filled 2011-03-08 (×3): qty 25

## 2011-03-08 MED ORDER — PNEUMOCOCCAL VAC POLYVALENT 25 MCG/0.5ML IJ INJ
0.5000 mL | INJECTION | INTRAMUSCULAR | Status: AC
  Administered 2011-03-08: 0.5 mL via INTRAMUSCULAR
  Filled 2011-03-08 (×2): qty 0.5

## 2011-03-08 MED ORDER — OXYCODONE-ACETAMINOPHEN 5-325 MG PO TABS
1.0000 | ORAL_TABLET | ORAL | Status: DC | PRN
  Administered 2011-03-08: 1 via ORAL
  Filled 2011-03-08: qty 1

## 2011-03-08 MED ORDER — PRASUGREL HCL 10 MG PO TABS
10.0000 mg | ORAL_TABLET | Freq: Every day | ORAL | Status: DC
  Filled 2011-03-08: qty 1

## 2011-03-08 MED ORDER — EZETIMIBE 10 MG PO TABS
10.0000 mg | ORAL_TABLET | Freq: Every day | ORAL | Status: DC
  Administered 2011-03-08 – 2011-03-11 (×4): 10 mg via ORAL
  Filled 2011-03-08 (×4): qty 1

## 2011-03-08 MED ORDER — BIOTENE DRY MOUTH MT LIQD: 15.0000 mL | OROMUCOSAL | Status: DC | PRN

## 2011-03-08 MED ORDER — GABAPENTIN 300 MG PO CAPS
300.0000 mg | ORAL_CAPSULE | Freq: Two times a day (BID) | ORAL | Status: DC
  Administered 2011-03-08 – 2011-03-11 (×7): 300 mg via ORAL
  Filled 2011-03-08 (×8): qty 1

## 2011-03-08 MED ORDER — ALPRAZOLAM 0.25 MG PO TABS: 0.2500 mg | ORAL_TABLET | Freq: Two times a day (BID) | ORAL | Status: DC | PRN

## 2011-03-08 MED ORDER — OMEPRAZOLE MAGNESIUM 20 MG PO TBEC: 20.0000 mg | DELAYED_RELEASE_TABLET | Freq: Every day | ORAL | Status: DC

## 2011-03-08 MED ORDER — ROSUVASTATIN CALCIUM 10 MG PO TABS
10.0000 mg | ORAL_TABLET | Freq: Every day | ORAL | Status: DC
  Administered 2011-03-08: 10 mg via ORAL
  Filled 2011-03-08: qty 1

## 2011-03-08 MED ORDER — NITROGLYCERIN IN D5W 200-5 MCG/ML-% IV SOLN
INTRAVENOUS | Status: AC
  Administered 2011-03-08: 5 ug/min via INTRAVENOUS
  Filled 2011-03-08: qty 250

## 2011-03-08 MED ORDER — METOPROLOL TARTRATE 1 MG/ML IV SOLN
2.5000 mg | Freq: Once | INTRAVENOUS | Status: AC
  Administered 2011-03-08: 2.5 mg via INTRAVENOUS

## 2011-03-08 MED ORDER — HEPARIN (PORCINE) IN NACL 2-0.9 UNIT/ML-% IJ SOLN
INTRAMUSCULAR | Status: AC
  Filled 2011-03-08: qty 1000

## 2011-03-08 MED ORDER — MORPHINE SULFATE 2 MG/ML IJ SOLN
2.0000 mg | INTRAMUSCULAR | Status: DC | PRN
  Administered 2011-03-08: 2 mg via INTRAVENOUS
  Filled 2011-03-08: qty 1

## 2011-03-08 MED ORDER — ATROPINE SULFATE 1 MG/ML IJ SOLN
INTRAMUSCULAR | Status: AC
  Filled 2011-03-08: qty 1

## 2011-03-08 MED ORDER — FENTANYL CITRATE 0.05 MG/ML IJ SOLN
INTRAMUSCULAR | Status: AC
  Filled 2011-03-08: qty 2

## 2011-03-08 MED ORDER — ACETAMINOPHEN 325 MG PO TABS
650.0000 mg | ORAL_TABLET | ORAL | Status: DC | PRN
  Administered 2011-03-09: 650 mg via ORAL
  Filled 2011-03-08: qty 1
  Filled 2011-03-08: qty 2

## 2011-03-08 MED ORDER — ASPIRIN 81 MG PO CHEW
81.0000 mg | CHEWABLE_TABLET | Freq: Every day | ORAL | Status: DC
  Administered 2011-03-09: 81 mg via ORAL
  Filled 2011-03-08: qty 1

## 2011-03-08 MED ORDER — ZOLPIDEM TARTRATE 5 MG PO TABS: 10.0000 mg | ORAL_TABLET | Freq: Every evening | ORAL | Status: DC | PRN

## 2011-03-08 MED FILL — Dextrose Inj 5%: INTRAVENOUS | Qty: 50 | Status: AC

## 2011-03-08 NOTE — Progress Notes (Signed)
Subjective: Chest pain. Off & on since 6AM  Objective: Vital signs in last 24 hours: Temp:  [97.8 F (36.6 C)-98.4 F (36.9 C)] 98.4 F (36.9 C) (11/29 0400) Pulse Rate:  [80-86] 82  (11/29 0500) Resp:  [13-20] 15  (11/29 0500) BP: (107-152)/(66-101) 113/70 mmHg (11/29 0500) SpO2:  [96 %-99 %] 97 % (11/29 0500) Arterial Line BP: (120-138)/(68-80) 124/70 mmHg (11/29 0500) Weight:  [79 kg (174 lb 2.6 oz)-82.3 kg (181 lb 7 oz)] 181 lb 7 oz (82.3 kg) (11/29 0500) Weight change:    Intake/Output from previous day: 11/28 0701 - 11/29 0700 In: 620 [P.O.:120; I.V.:500] Out: 430 [Urine:430] Intake/Output this shift: Total I/O In: 620 [P.O.:120; I.V.:500] Out: 430 [Urine:430]  PE: GENERAL:alert, oriented, but in obvious pain.  HEART:S1S2, rrr, no obvious murmur. LUNGS:clear without rales, rhonchi or wheezes. ABD:+BS, soft, non-tender. EXT:Rt. Groin, sheath in place.no hematoma. 2+ post tib pulses bil.   Lab Results:  [facility] 03/08/11 0215 03/07/11 2305  WBC 9.1 9.7  HGB 9.8* 10.0*  HCT 30.3* 31.5*  PLT 268 273   BMET  Basename 03/08/11 0215 03/07/11 2305  NA 132* 128*  K 4.0 3.2*  CL 98 94*  CO2 25 22  GLUCOSE 142* 142*  BUN 11 12  CREATININE 0.67 0.65  CALCIUM 8.5 7.7*    Basename 03/08/11 0137  TROPONINI 9.69*    Lab Results  Component Value Date   CHOL 266* 03/08/2011   HDL 74 03/08/2011   LDLCALC 169* 03/08/2011   TRIG 113 03/08/2011   CHOLHDL 3.6 03/08/2011   No results found for this basename: HGBA1C     No results found for this basename: TSH    Hepatic Function Panel  Basename 03/07/11 2305  PROT 5.6*  ALBUMIN 2.8*  AST 13  ALT 10  ALKPHOS 91  BILITOT 0.1*  BILIDIR --  IBILI --    Basename 03/08/11 0217  CHOL 266*   No results found for this basename: PROTIME in the last 72 hours    EKG: Orders placed during the hospital encounter of 03/07/11  . EKG 12-LEAD  . EKG 12-LEAD  . EKG 12-LEAD  . EKG 12-LEAD  . EKG 12-LEAD  . EKG  12-LEAD  . EKG 12-LEAD    Studies/Results: No results found.  Medications: I have reviewed the patient's current medications.  Assessment/Plan: Patient Active Problem List  Diagnoses  . STEMI (ST elevation myocardial infarction)  . HTN (hypertension)  . CAD (coronary artery disease), native coronary artery  . Hyperlipemia  . Previous back surgery  . Depression  NSVT  PLAN: EKG much improved from admit. Will keep sheath in place, discussed with Dr. Herbie Baltimore. IV heparin to start.  IV Lopressor given. Will send back to cath lab.     LOS: 1 day   INGOLD,LAURA R 03/08/2011, 6:53 AM  I have seen and examined the patient along with Nada Boozer, NP).  I have reviewed the chart, notes and new data.  I agree with Laura's note.  Key new complaints: recurrent chest ; began @ ~0600 just prior to sheath pull.   Key examination changes: no new exam findings Key new findings / data: ECG with evolving Q waves, minimal inferior ST elevation which could easily be evolving.  Compared to pre-PCI ECG - unremarkable. Several short burst of NSVT followed by one longer run.  PLAN: Based more upon recurrence of severe pain, than ECG findings, I have discussed with her the options & we have agreed to go  back to the cath lab for angiographic evaluation.  The procedure with Risks/Benefits/Alternatives and Indications was reviewed with the patient.  All questions were answered.    Risks / Complications include, but not limited to: Death, MI, CVA/TIA, VF/VT (with defibrillation), Bradycardia (need for temporary pacer placement), contrast induced nephropathy, bleeding / bruising / hematoma / pseudoaneurysm, vascular or coronary injury (with possible emergent CT or Vascular Surgery), adverse medication reactions, infection.    The patient voices understanding and agree to proceed.   I have signed the consent form and placed it on the chart for patient signature and RN witness.     Marykay Lex, M.D.,  M.S. [facility] [address] [place], [place]  [address]  [phone]  03/08/2011 7:17 AM      Marykay Lex, M.D., M.S. [facility] [address] [place], [place]  [address]  [phone]  03/08/2011 7:12 AM

## 2011-03-08 NOTE — Progress Notes (Signed)
Pt. Refusing higher dose of crestor. She stated all statins have caused her to have leg pain.  Will discuss with MD.  Her LDL was 169.   We can discuss lipid therapy as an outpatient.  Marykay Lex, M.D., M.S. [facility] [address] [place], [place]  [address]  [phone]  03/08/2011 4:42 PM

## 2011-03-08 NOTE — Progress Notes (Signed)
Chaplain had brief visit with patient and friend; patient stated that she was exhausted from being awake all night from her admittance to the ED through her catherization procedure. Chaplain introduced self and offered emotional support. Chaplain was not able to identify any particular needs other than her need for rest. Follow up as needed.

## 2011-03-08 NOTE — Progress Notes (Signed)
UR Completed.   Jessica Landry. 03/08/2011 [phone]  

## 2011-03-08 NOTE — Progress Notes (Signed)
ANTICOAGULATION CONSULT NOTE - Initial Consult  Pharmacy Consult for heparin Indication: STEMI  Allergies  Allergen Reactions  . Sulfa Antibiotics Other (See Comments)    unknown    Patient Measurements: Height: 5\' 8"  (172.7 cm) Weight: 181 lb 7 oz (82.3 kg) IBW/kg (Calculated) : 63.9  Adjusted Body Weight: 80.6 kg  Vital Signs: Temp: 98.4 F (36.9 C) (11/29 0400) Temp src: Oral (11/29 0400) BP: 113/70 mmHg (11/29 0500) Pulse Rate: 82  (11/29 0500)  Labs:  Basename 03/08/11 0246 03/08/11 0215 03/08/11 0137 03/07/11 2305  HGB -- 9.8* -- 10.0*  HCT -- 30.3* -- 31.5*  PLT -- 268 -- 273  APTT -- -- -- >200*  LABPROT 18.6* -- -- 60.0*  INR 1.52* -- -- 6.81*  HEPARINUNFRC -- -- -- --  CREATININE -- 0.67 -- 0.65  CKTOTAL -- -- 517* --  CKMB -- -- 50.7* --  TROPONINI -- -- 9.69* --   Estimated Creatinine Clearance: 89.4 ml/min (by C-G formula based on Cr of 0.67).  Medical History: Past Medical History  Diagnosis Date  . Hypertension   . STEMI (ST elevation myocardial infarction) 03/07/2011  . HTN (hypertension) 03/07/2011  . Neuropathy   . CAD (coronary artery disease), native coronary artery 03/08/2011  . Hyperlipemia 03/08/2011  . History of back surgery   . GERD (gastroesophageal reflux disease)   . Anxiety   . Arthritis   . Depression   . Depression 03/08/2011   Medications:  Prescriptions prior to admission  Medication Sig Dispense Refill  . atenolol (TENORMIN) 25 MG tablet Take 25 mg by mouth daily.        Marland Kitchen buPROPion (WELLBUTRIN XL) 150 MG 24 hr tablet Take 150 mg by mouth daily.        Marland Kitchen estrogens, conjugated, (PREMARIN) 0.625 MG tablet Take 0.625 mg by mouth daily. Take daily for 21 days then do not take for 7 days.       Marland Kitchen gabapentin (NEURONTIN) 300 MG capsule Take 300 mg by mouth 2 (two) times daily.        . meloxicam (MOBIC) 15 MG tablet Take 15 mg by mouth daily.        Marland Kitchen omeprazole (PRILOSEC OTC) 20 MG tablet Take 20 mg by mouth daily.          . rosuvastatin (CRESTOR) 5 MG tablet Take 5 mg by mouth 2 (two) times a week. Takes on Tues and Thurs       . venlafaxine (EFFEXOR-XR) 150 MG 24 hr capsule Take 150 mg by mouth daily.         Scheduled:    . aspirin  324 mg Oral NOW   Or  . aspirin  300 mg Rectal NOW  . aspirin EC  81 mg Oral Daily  . atropine      . bivalirudin      . buPROPion  150 mg Oral Daily  . fentaNYL      . gabapentin  300 mg Oral BID  . heparin      . heparin  4,000 Units Intravenous STAT  . influenza  inactive virus vaccine  0.5 mL Intramuscular Tomorrow-1000  . lidocaine      . metoprolol  2.5 mg Intravenous Once  . metoprolol tartrate  25 mg Oral BID  . midazolam      . midazolam      . nitroGLYCERIN      . pantoprazole  40 mg Oral Q1200  . pneumococcal 23 valent  vaccine  0.5 mL Intramuscular Tomorrow-1000  . potassium chloride  40 mEq Oral Once  . prasugrel      . prasugrel  10 mg Oral Daily  . rosuvastatin  5 mg Oral 2 times weekly  . sodium chloride  3 mL Intravenous Q12H  . venlafaxine  150 mg Oral Daily  . DISCONTD: omeprazole  20 mg Oral Daily   Assessment: 55yo female transferred from [facility] w/ STEMI for emergent cath, now experiencing recurrent CP with runs of Vtach, to begin heparin.  Goal of Therapy:  Heparin level 0.3-0.7 units/ml   Plan:  Will begin heparin gtt at 1100 units/hr without bolus given recent cath and Angiomax.  Monitor heparin levels and CBC.  Colleen Can PharmD BCPS 03/08/2011,7:15 AM

## 2011-03-08 NOTE — Progress Notes (Signed)
Pt started complaining of 10/10 chest pain at 0600.  EKG obtained. 1mm ST elevation noted in lead 2,3 and vF. Gave pt 3 sublingual nitro's which did not relieve pain. Pt then given 2mg  I morphine which reduced chest pain to 4/10. During this time, pt had to runs of VTach, 30 beats, where she became SOB. Nada Boozer notified of all above events and came to bedside. Dr. Herbie Baltimore called and decided to re-cath pt to make sure vessels were clean. Ordered to give 2.5mg  IV metoprolol and start nitro drip at and tiltrate to . Pt on way to cath lab now.  [place], [place]

## 2011-03-08 NOTE — Op Note (Signed)
[facility]     CARDIAC CATHETERIZATION REPORT  NAME:  Jessica Landry    MRN: [id number] DATE OF BIRTH:  Sep 15, 1955    ADMIT DATE:  03/07/2011  Performing Cardiologist: Jessica Landry, M.D., M.S. Primary Physician: No primary provider on file. Primary Cardiologist:  Jessica Landry, M.D., M.S  Procedures Performed:  Left Heart Catheterization via 6 Fr Right Common Femoral Artery access  Left Ventriculography, (RAO)  10 ml/sec for  30 ml total contrast  Native Coronary Angiography   Percutaneous Coronary Artery Intervention on  mid RCA with a  3.5 mm x  28 mm  Promus drug-eluting stent (DES); final diameter  3.75 mm proximal,  4.12 mm distal  Indication(s): Inferior ST elevation MI  Hypertension  Smoking history  History: 55 y.o. female  with a history of long-term smoking and hypertension who had been in her usual state of health until he began to develop substernal/epigastric pain after walking her dog this evening roughly 2030 hrs.  The pain initially was mild and associated with nausea that she thought was indigestion. He then began to get progressively worse over the next hour she didn't have diaphoresis and worsening pain, so she called EMS.  Upon arrival [facility] checked ECG that showed significant Inferior ST elevations in II, III, aVF with reciprocal changes anterolateral leads.  Code STEMI was activated by EMS, and the cath lab team was paced at 2239 hours.   She arrived [facility] emergency room at 2258 hours, and was brought to [facility] at 2313 hrs.  Consent:    Risks of procedure as well as the alternatives and risks of each were explained to the (patient/caregiver).  Consent for procedure obtained. Emergency verbal consent  was obtained and witnessed by Cath Lab staff and EMS.   Procedure: The patient was brought to the 2nd Floor [facility] Lab in the fasting state and prepped and draped in the usual  sterile fashion for Right groin access.  Sterile technique was used including antiseptics, cap, gloves, gown, hand hygiene, mask and sheet.  Skin prep: Chlorhexidine;  Time Out: Verified patient identification, verified procedure, site/side was marked, verified correct patient position, special equipment/implants available, medications/allergies/relevent history reviewed, required imaging and test results available.  Performed  The right femoral head was identified using tactile and fluoroscopic technique.  The right groin was anesthetized with 1% subcutaneous Lidocaine.  The initial arterial puncture with a needle lead to the patient bearing-down and the needle exiting the artery while the wire was being advanced. Manual pressure was held for 3 minutes to ensure hemostasis prior to all access. The difficulty in obtaining access proved to be a 5 minute non-system delay.  The right Common Femoral Artery was accessed using the Modified Seldinger Technique with placement of a antimicrobial bonded/coated single lumen (6 Fr) sheath was placed in the Right Common Femoral Artery.  . The sheath was aspirated and flushed.    A 6 Fr JL4 Catheter was advanced of over a Versicore  wire into the ascending Aorta.  The catheter was used to engage the left coronary artery.  Multiple cineangiographic views of the left coronary artery system were performed.   A 6 Fr JR 4 Guide Catheter was advanced of over a standard J-wire into the ascending Aorta.  Attempts to engage the right coronary artery with this catheter were unsuccessful.  The guide cath was exchanged for a 6 [place] No Torque Guiding catheter which was  also not successful in obtaining coaxial engagement of the right coronary artery however a nonselective cineangiographic view demonstrated 100% occluded mid RCA as the culprit lesion.  Hemodynamics:  Central Aortic Pressure / Mean Aortic Pressure: Initial 162/101 mmHg; mean 125 mmHg.  Post intervention:  103/1mmHg; 82 mmHg  LV Pressure / LV End diastolic Pressure:  Post intervention: 101/20 mmHg; LVEDP 24 mmHg  Left Ventriculography:  EF:  55%  Wall Motion: Mild inferobasal hypokinesis  Coronary Angiographic Data: Dominance: Right coronary artery  Left Main:  Moderate to large caliber, bifurcates into a small nondominant circumflex and LAD, angiographically normal  Left Anterior Descending (LAD):  Moderately large caliber vessel courses down to and around the apex. Long tubular 30-40% mid vessel just distal to D1  1st diagonal (D1):  Moderate-caliber branching vessel; angiographically normal  2nd diagonal (D2):  Small vessel, angiographically normal  Circumflex (LCx):  Very small nondominant bifurcated vessel with several small obtuse marginal branches, no significant angiographic evidence disease.  Right Coronary Artery: Large caliber dominant vessel, that begins to taper just at the proximal portion and is 100% occluded, thrombic between 2 small RV marginal branches.; TIMI 0 flow Post intervention - 0% stenosis, TIMI-3 flow- on artery is as follows:  right ventricle branch of right coronary artery: Several small branches  Distal RCA post stenosis Continues to be a large caliber vessel tapering to the bifurcation between the right posterior descending and right posterior atrioventricular groove artery; angiographically normal  posterior descending artery:  Moderate caliber vessel That reaches down to the apex; angiographically normal  posterior lateral branch:  Large-caliber posterior atrioventricular groove artery gives off one small branching posterolateral branch and then terminates into a moderate caliber vessel it tapers to several branches along the posterior lateral margin; angiographically normal  PERCUTANEOUS CORONARY INTERVENTION PROCEDURE  After reviewing the initial cineangiography images, the culprit lesion(s) in the mid RCA was identified, and the decision was made to  proceed with percutaneous coronary intervention.  A weight based bolus of IV Angiomax was administered and the drip was continued until completion of the procedure (with the plan to continue for 2 hours).   Oral 60 mg, Prasugrel was administered.  The No Torque Guide catheter was exchanged for a 6 French Amplatz Left 1 Guide catheter which was used to easily engaged the high anterior takeoff RCA.   An ACT of > 200 Sec was confirmed prior to advancing the Guidewire.  Lesion #1:  Mid RCA, tapering lesion from a 4+ millimeter vessel to 100% occlusion at A 90% stenotic lesion. Total lesion length 24 mm  Pre-PCI Stenosis:  100 % Post-PCI Stenosis:  0 %     TIMI  0 flow       TIMI  3 flow -- restored after wire-passing, therefore thrombectomy was not performed at the 90% stenotic lesion.  Guide Catheter:  6 Fr AL-1    Guidewire:  [place]ater  Pre-Dilitation Balloon: 2.0 mm x 12 mm Emerge compliant balloon.   1st Inflation:    8 Atm for 45 Sec   2nd Inflation:  10 Atm for 50 Sec  Scout angiography did not reveal evidence of dissection or perforation;  TIMI-3 flow preserved  Stent: Promus element DES 3.5 mm x 28 mm   Deployment:  14 Atm for 45 Sec   2nd Inflation:  16 Atm for 45 Sec Scout angiography did not reveal evidence of dissection or perforation; TIMI-3 flow preserved.  Post-Dilitation Balloon: [place] Quantum Apex 4.0 mm x 15 mm;  noncompliant balloon    1st Inflation:   12 Atm for  45 Sec; distal portion of the stent; final distal diameter 3.75 mm   2nd Inflation:  16 Atm for  60 Sec; mid stent at the most significant stenosis -- final diameter 4.12 mm    3rd Inflation:  16 Atm for  60 Sec; proximal stent --  final diameter 4.12 mm    Post deployment angiography did not reveal evidence of dissection or perforation.  There is excellent stent expansion with brisk TIMI-3 flow down a large dominant right coronary artery .  The guide catheter was then exchanged over the standard J wire for an angled  Pigtail catheter That was advanced across the Aortic Valve.  LV hemodynamics were measured and Left Ventriculography was performed in the RAO projection.  LV hemodynamics were then re-sampled, and the catheter was pulled back across the Aortic Valve for measurement of "pull-back" gradient.  The catheter and the wire was removed completely out of the body.  With the wire reinserted through the sheath a selective right common femoral angiogram was performed showing excellent sheath position. The sheath a 6 French sheath was upsized to a 7 [place] sheath to obtain better hemostasis as there was prasugrel moderate amount of tract ooze noted.  With this upsized there was no further oozing and no hematoma.  The patient was transported the  CCU in  Stable condition.   The patient  was stable before, during and following the procedure.   Patient did tolerate procedure well. There were not complications.  EBL: Less than 20 mL  20 mL for blood sample;    Medications:  Sedation:  3 mg IV Versed,  100  mcg IV Fentanyl  Contrast:  250 Omnipaque  Angiomax bolus and drip, drip rate decreased to reduced rate to complete current bag (1-1/2-2 hours) Prasugrel 60 mg by EMS - aspirin 325 mg,  Sublingual nitroglycerin  Impression: 1.  Inferior ST elevation MI, with a 100% mid RCA occlusion as the culprit lesion -reduced to 90% with wire passage. 2.  Successful percutaneous coronary intervention of the mid RCA with A Promus DES 2.5 mm x 28 mm (postdilated proximally to 4.1 2 mm, and distal to 3.75 mm). 3.  Preserved Left Ventricular Ejection Fraction - 55% with mild inferior hypokinesis.  Plan: 1.  Admit to CCU on step-down status for fast-track protocol.  With anticipation of discharge In on December 1st if stable. 2.  Continue Angiomax drip until current back complete. 3.  Dual antiplatelet therapy with aspirin and Prasugrel for A minimum of one year. 4.  Aggressive risk factor modification --smoking cessation  counseling, statin therapy. 5.  Cardiac Rehabilitation Phase I and Phase II consultation.  The case and results was discussed with the patient and significant other. The case and results was not discussed with the patient's PCP. The case and results was discussed with the patient's Cardiologist.  Time Spend Directly with Patient:  90 minutes  Keia Rask W, M.D., M.S. [facility] [address] [place], [place]  [address]  [phone]

## 2011-03-08 NOTE — Progress Notes (Signed)
Provided support to pt upon arrival to ED and transfer to cath lab.  Provided support to pt's sig. Other of 2 years Jonette Pesa) during procedure, meeting with physician and transfer to 2900.    Significant other concerned about privacy / boundaries in who can visit pt.  Pt in final stages of long divorce process.  Sig other mentioned he would be angry if pt's ex-husband were to come to the hospital.     03/07/11 0115  Clinical Encounter Type  Visited With Patient;Family  Visit Type Spiritual support;Psychological support (STEMI)  Referral From (CODE STEMI)  Consult/Referral To Chaplain;Nurse  Spiritual Encounters  Spiritual Needs Emotional  Stress Factors  Patient Stress Factors Family relationships

## 2011-03-08 NOTE — Op Note (Signed)
[facility]     CARDIAC CATHETERIZATION REPORT  Jessica Landry   MRN: [id number] Dec 13, 1955    ADMIT DATE:  03/07/2011  Performing Cardiologist: Marykay Lex, M.D., M.S. Primary Physician: No primary provider on file. Primary Cardiologist:  Marykay Lex, M.D., M.S.  Procedures Performed:  Left Heart Catheterization via 7 Fr Right Femoral Artery access  Native Coronary Angiography  Indication(s): Recurrent angina s/p PCI for Inferior STEMI earlier today.  HTN, Chronic Smoker.  History: 55 y.o. female was admitted early this AM for and received PTCA/stent: right coronary artery treatment.  Chest pain (without additional ECG changes).   We discussed potential options, and decided the best plan would be to perform "re-look" coronary angiography.  Consent: The procedure with Risks/Benefits/Alternatives and Indications was reviewed with the patient.  All questions were answered.    Risks / Complications include, but not limited to: Death, MI, CVA/TIA, VF/VT (with defibrillation), Bradycardia (need for temporary pacer placement), contrast induced nephropathy, bleeding / bruising / hematoma / pseudoaneurysm, vascular or coronary injury (with possible emergent CT or Vascular Surgery), adverse medication reactions, infection.    The patient voice understanding and agree to proceed.    Risks of procedure as well as the alternatives and risks of each were explained to the (patient/caregiver).  Consent for procedure obtained. Consent for signed by MD and patient with RN witness -- placed on chart.  Procedure: The patient was brought to the 2nd Floor [facility] Lab in the fasting state and prepped and draped in the usual sterile fashion for Right groin access. Sterile technique was used including antiseptics, cap, gloves, gown, hand hygiene, mask and sheet.  Skin prep: Chlorhexidine;  Time Out: Verified patient identification, verified  procedure, site/side was marked, verified correct patient position, special equipment/implants available, medications/allergies/relevent history reviewed, required imaging and test results available.  Performed  The right femoral head was identified using tactile and fluoroscopic technique.  The right groin was anesthetized with 1% subcutaneous Lidocaine.  The 7Fr right Common Femoral Artery Sheath was exchanged over a short wire for a new 7Fr sheath using separate sterile draping.  The old sheath & soiled towel drape & gloves were removed and discarded.  With new gloves, the sheath was aspirated and flushed.    A 6 Fr AL1 Catheter was advanced of over a Standard J wire into the ascending Aorta.  The catheter was used to engage the Right Coronary Artery.  Multiple cineangiographic views of the RCA system were performed demonstrating a widely patent stent with a jailed small RV marginal branch. A 6 Fr FL4 Catheter was advanced of over a Standard J wire into the ascending Aorta.  The catheter was used to engage the Left Coronary Artery.  Multiple cineangiographic views of the Left Coronary Artery system were performed.  This catheter was then exchanged over the Standard J wire for 6Fr AL1 catheter that was advanced across the Aortic Valve to sample LV hemodynamics, and the catheter was pulled back across the Aortic Valve for measurement of "pull-back" gradient.  The catheter and the wire was removed completely out of the body.     The patient  was stable before, during and following the procedure and was transported to the holding area in stable condition. Patient did tolerate procedure well. There were not complications.  EBL: <1ml  Medications:  Sedation:  2 mg IV Versed, 25 mcg IV Fentanyl  Contrast:  65 Omnipaque  Hemodynamics: LV End diastolic Pressure:  20 mmHg  Coronary Angiographic Data:  Dominance: Right coronary artery  Left Main: Moderate to large caliber, bifurcates into a small  nondominant circumflex and LAD, angiographically normal  Left Anterior Descending (LAD): Moderately large caliber vessel courses down to and around the apex. Long tubular 30-40% mid vessel just distal to D1  1st diagonal (D1): Moderate-caliber branching vessel; angiographically normal  2nd diagonal (D2): Small vessel, angiographically normal  Circumflex (LCx): Very small nondominant bifurcated vessel with several small obtuse marginal branches, no significant angiographic evidence disease.  Right Coronary Artery: Large caliber dominant vessel, with WIDELY PATENT STENT. right ventricle branch of right coronary artery: Several very small branches -- 2 branches come off from within the stent, the second branch is jailed with a ~90% ostial stenosis with "sluggish" flow that may be the culprit for her pain. Distal RCA post stenosis Continues to be a large caliber vessel tapering to the bifurcation between the right posterior descending and right posterior atrioventricular groove artery; angiographically normal  posterior descending artery: Moderate caliber vessel That reaches down to the apex; angiographically normal  posterior lateral branch: Large-caliber posterior atrioventricular groove artery gives off one small branching posterolateral branch and then terminates into a moderate caliber vessel it tapers to several branches along the posterior lateral margin; angiographically normal  Impression: 1.  Widely patent new stent to Mid RCA -- potential culprit for recurrent angina is small RV Marginal branch that is jailed by stent. 2.  EDP improved post cath.  Plan: 1.  Return to CCU/TCU after sheath pull. 2.  Continue NTG gtt today - wean off as Sx resolve 3.  Anxiolytic. 4.  Remainder of plan per previous note.  The case and results was discussed with the patient (and family). The case and results was not discussed with the patient's PCP. The case and results was discussed with the patient's  Cardiologist.  Time Spend Directly with Patient:  30 minutes  HARDING,DAVID W, M.D., M.S. [facility] [address] [place], [place]  [address]  [phone]  03/08/2011 8:37 AM

## 2011-03-08 NOTE — Progress Notes (Signed)
Spoke with patient, who states she is not aware of new medication to be prescribed (Effient). I did let her know that we can assist her with medications as the case may be. Patient states she has insurance through her x-husband until Monday, and then goes on another insurance on Tuesday. Patient a bit groggy from procedure and unable to specify insurance carriers. Will need to speak again late today or tomorrow to ascertain insurance specifics so can best serve patient. Expected discharge is on Saturday.

## 2011-03-08 NOTE — Consult Note (Signed)
Pt smokes 1/4 ppd and is in action stage eager to quit. Recommended 14 mg patch x 2 weeks, 7 mg patch x 2 weeks. Discussed patch use instructions. Referred to 1-800 quit now for f/u and support. Discussed oral fixation substitutes, second hand smoke and in home smoking policy. Reviewed and gave pt Written education/contact information.

## 2011-03-09 ENCOUNTER — Encounter (HOSPITAL_COMMUNITY): Payer: Self-pay | Admitting: Cardiology

## 2011-03-09 DIAGNOSIS — Z862 Personal history of diseases of the blood and blood-forming organs and certain disorders involving the immune mechanism: Secondary | ICD-10-CM | POA: Diagnosis present

## 2011-03-09 DIAGNOSIS — D509 Iron deficiency anemia, unspecified: Secondary | ICD-10-CM

## 2011-03-09 HISTORY — DX: Iron deficiency anemia, unspecified: D50.9

## 2011-03-09 LAB — CBC
HCT: 33.9 % — ABNORMAL LOW (ref 36.0–46.0)
Hemoglobin: 10.6 g/dL — ABNORMAL LOW (ref 12.0–15.0)
MCH: 27.5 pg (ref 26.0–34.0)
MCV: 87.8 fL (ref 78.0–100.0)
RBC: 3.86 MIL/uL — ABNORMAL LOW (ref 3.87–5.11)

## 2011-03-09 LAB — BASIC METABOLIC PANEL
CO2: 28 mEq/L (ref 19–32)
Calcium: 9 mg/dL (ref 8.4–10.5)
Creatinine, Ser: 0.77 mg/dL (ref 0.50–1.10)
Glucose, Bld: 95 mg/dL (ref 70–99)

## 2011-03-09 MED ORDER — ALUM & MAG HYDROXIDE-SIMETH 200-200-20 MG/5ML PO SUSP
30.0000 mL | ORAL | Status: DC | PRN
  Administered 2011-03-09: 30 mL via ORAL
  Filled 2011-03-09: qty 30

## 2011-03-09 MED ORDER — VENLAFAXINE HCL ER 150 MG PO CP24
150.0000 mg | ORAL_CAPSULE | Freq: Every day | ORAL | Status: DC
  Administered 2011-03-09 – 2011-03-10 (×2): 150 mg via ORAL
  Filled 2011-03-09 (×4): qty 1

## 2011-03-09 MED ORDER — SODIUM CHLORIDE 0.9 % IV BOLUS (SEPSIS)
500.0000 mL | Freq: Once | INTRAVENOUS | Status: AC
  Administered 2011-03-09: 500 mL via INTRAVENOUS

## 2011-03-09 NOTE — Progress Notes (Signed)
CARDIAC REHAB PHASE I   PRE:  Rate/Rhythm: 95 SR    BP: sitting 100/70    SaO2:  RA  MODE:  Ambulation: 400 ft   POST:  Rate/Rhythm: 90    BP: sitting 116/70     SaO2: 100 RA  Tolerated walk well however pt c/o 4/10 chest tightness after walk.  Then sts it has been there most of the day (except when she slept).  Ed completed, chest tightness still there.  Notified RN and applied 2L O2. Now in the care of RN.  Pt reqeusts her name be sent to [facility]. Motivated to quit smoking.  [phone]  Harriet Masson CES, ACSM

## 2011-03-09 NOTE — Progress Notes (Signed)
Pt started experiencing chestpain 5/10 after walking with cardiac rehab around 1545. Pt given 1 sl nitro. Pt's chestpain reassessed after five minutes. B/P 93/57. Pt given 1 xanax. Pt reassessed at 1600. Pt reports being chestpain free at time of reassessment.

## 2011-03-09 NOTE — Progress Notes (Signed)
Clinical Social Worker met with patient and friend Brett Canales) at bedside. Patient stated that she currently does not have any concerns regarding her financial responsibility of her hospital stay. Patient stated "my ex-husband was instructed not to take me off his insurance until everything had been resolved here." CSW will sign off for now as social work intervention is no longer needed. Please consult [name] again if new needs arise.   Rozetta Nunnery MSW, [employer] [phone]

## 2011-03-09 NOTE — Progress Notes (Signed)
Subjective:  No chest pain  Objective:  Vital Signs in the last 24 hours: Temp:  [97.4 F (36.3 C)-98.3 F (36.8 C)] 98.3 F (36.8 C) (11/30 0400) Pulse Rate:  [66-78] 72  (11/30 0600) Resp:  [13-19] 16  (11/30 0400) BP: (85-110)/(52-78) 92/52 mmHg (11/30 0600) SpO2:  [91 %-99 %] 91 % (11/30 0600) Arterial Line BP: (118)/(71) 118/71 mmHg (11/29 0700) Weight:  [78.8 kg (173 lb 11.6 oz)] 173 lb 11.6 oz (78.8 kg) (11/30 0546)  Intake/Output from previous day: 11/29 0701 - 11/30 0700 In: 1380 [P.O.:480; I.V.:900] Out: 4750 [Urine:4750]  Physical Exam: General appearance: alert, cooperative and no distress Lungs: clear to auscultation bilaterally Heart: regular rate and rhythm, S1, S2 normal, no murmur, click, rub or gallop Extremities: some echymosis, no hematoma Rt groin   Rate: 72  Rhythm: normal sinus rhythm  Lab Results:  Basename 03/09/11 0552 03/08/11 0215  WBC 8.5 9.1  HGB 10.6* 9.8*  PLT 276 268    Basename 03/08/11 0743 03/08/11 0215  NA 136 132*  K 4.3 4.0  CL 101 98  CO2 26 25  GLUCOSE 105* 142*  BUN 9 11  CREATININE 0.65 0.67    Basename 03/08/11 1244 03/08/11 0730  TROPONINI >25.00* >25.00*   Hepatic Function Panel  Basename 03/08/11 0743  PROT 5.8*  ALBUMIN 2.7*  AST 70*  ALT 15  ALKPHOS 94  BILITOT 0.2*  BILIDIR --  IBILI --    Basename 03/08/11 0217  CHOL 266*    Basename 03/08/11 0246  INR 1.52*    Imaging: Dg Chest Port 1 View  03/08/2011  *RADIOLOGY REPORT*  Clinical Data: Myocardial infarction.  Tobacco abuse.  PORTABLE CHEST - 1 VIEW  Comparison: None  Findings: Midline trachea.  Normal heart size.  No pleural effusion or pneumothorax.  Minimal right hemidiaphragm elevation. No congestive failure.  Mild bibasilar subsegmental atelectasis.  IMPRESSION: No acute findings.  Original Report Authenticated By: Consuello Bossier, M.D.    Cardiac Studies:  Assessment/Plan:   Principal Problem:  *STEMI (ST elevation myocardial  infarction), RCA DES 11/29 with relook 6hrs later showing jailed RV marginal branch, stent OK Active Problems:   Hypotension  Hyperlipemia, pt says she is statin intol, (leg pain)  Smoking  Depression   Plan-mobilize, transfer to telemetry if B/P stable. She is will ing to try Crestor 5mg  3x week. Cardiac rehab consult.   Corine Shelter PA-C 03/09/2011, 6:53 AM  I have seen and examined the patient along with Corine Shelter PAc.  I have reviewed the chart, notes and new data.  I agree with PA's note.  Key new complaints: No further angina Key examination changes: No rub on exam Key new findings / data: rare runs of AIVR; ECG changes c/w inferior evolving ST elevation MI.  PLAN: Transfer to telemetry, ambulate.  Thurmon Fair, MD, [facility] [phone]  03/09/2011, 8:32 AM

## 2011-03-10 LAB — BASIC METABOLIC PANEL
BUN: 11 mg/dL (ref 6–23)
Chloride: 99 mEq/L (ref 96–112)
GFR calc Af Amer: >90 mL/min (ref >90)
Glucose, Bld: 144 mg/dL — ABNORMAL HIGH (ref 70–99)
Potassium: 3.9 mEq/L (ref 3.5–5.1)
Sodium: 135 mEq/L (ref 135–145)

## 2011-03-10 LAB — CBC
HCT: 31.4 % — ABNORMAL LOW (ref 36.0–46.0)
MCHC: 31.5 g/dL (ref 30.0–36.0)
MCV: 88.2 fL (ref 78.0–100.0)
RDW: 14.2 % (ref 11.5–15.5)

## 2011-03-10 LAB — CARDIAC PANEL(CRET KIN+CKTOT+MB+TROPI): Troponin I: 6.02 ng/mL (ref <0.30)

## 2011-03-10 MED ORDER — VENLAFAXINE HCL ER 150 MG PO CP24
150.0000 mg | ORAL_CAPSULE | Freq: Every day | ORAL | Status: DC
  Administered 2011-03-11: 150 mg via ORAL
  Filled 2011-03-10: qty 1

## 2011-03-10 MED ORDER — PRASUGREL HCL 10 MG PO TABS
10.0000 mg | ORAL_TABLET | Freq: Every day | ORAL | Status: DC
  Administered 2011-03-10 – 2011-03-11 (×2): 10 mg via ORAL
  Filled 2011-03-10 (×2): qty 1

## 2011-03-10 MED ORDER — ASPIRIN 81 MG PO TBEC
81.0000 mg | DELAYED_RELEASE_TABLET | Freq: Every day | ORAL | Status: DC
  Administered 2011-03-10 – 2011-03-11 (×2): 81 mg via ORAL
  Filled 2011-03-10 (×2): qty 1

## 2011-03-10 NOTE — Progress Notes (Signed)
At 1030 after walk with cardiac rehab patient c/o chest discomfort of 4/10, relieved somewhat with rest. 1 sl nitro given, bp 98/65. Chest discomfort 0/10  5 min after 1 sl nitro and bp 93/65. Xanax 0.25 mg also given.

## 2011-03-10 NOTE — Progress Notes (Signed)
Subjective: Chest pain after walking yesterday.  Objective: Vital signs in last 24 hours: Temp:  [98.3 F (36.8 C)-98.7 F (37.1 C)] 98.7 F (37.1 C) (11/30 2100) Pulse Rate:  [68-82] 80  (11/30 2127) Resp:  [18-20] 18  (11/30 2100) BP: (91-113)/(58-74) 113/74 mmHg (11/30 2127) SpO2:  [92 %-97 %] 97 % (11/30 2100) Weight change:  Last BM Date: 03/06/11 Intake/Output from previous day: -3270 11/30 0701 - 12/01 0700 In: 863 [P.O.:360; I.V.:503] Out: -  Intake/Output this shift:    PE: General: A & O X# Pleasant affect Heart: S1S2, RRR Lungs: Clear. ABD: soft, nontender. +BS. Ext. No edema.  Lab Results:  Basename 03/10/11 0500 03/09/11 0552  WBC 6.1 8.5  HGB 9.9* 10.6*  HCT 31.4* 33.9*  PLT 233 276   BMET  Basename 03/09/11 0552 03/08/11 0743  NA 136 136  K 4.0 4.3  CL 98 101  CO2 28 26  GLUCOSE 95 105*  BUN 9 9  CREATININE 0.77 0.65  CALCIUM 9.0 9.1    Basename 03/10/11 0500 03/08/11 1244  TROPONINI 6.02* >25.00*    Lab Results  Component Value Date   CHOL 266* 03/08/2011   HDL 74 03/08/2011   LDLCALC 169* 03/08/2011   TRIG 113 03/08/2011   CHOLHDL 3.6 03/08/2011   Lab Results  Component Value Date   HGBA1C 5.9* 03/08/2011     Lab Results  Component Value Date   TSH 3.800 03/08/2011    Hepatic Function Panel  Basename 03/08/11 0743  PROT 5.8*  ALBUMIN 2.7*  AST 70*  ALT 15  ALKPHOS 94  BILITOT 0.2*  BILIDIR --  IBILI --    Basename 03/08/11 0217  CHOL 266*   No results found for this basename: PROTIME in the last 72 hours    EKG: Orders placed during the hospital encounter of 03/07/11  . EKG 12-LEAD  . EKG 12-LEAD  . EKG 12-LEAD    Studies/Results: No results found.  Medications: I have reviewed the patient's current medications.  Assessment/Plan: Patient Active Problem List  Diagnoses  . STEMI (ST elevation myocardial infarction)  . CAD (coronary artery disease), native coronary artery  . Hyperlipemia  . Previous  back surgery  . Depression  . Smoking  . Iron deficiency anemia   PLAN:  Inf. Wall MI with DES stent to 100% occluded RCA.  Recurrent chest pain with sm. Branch occlusion. Hypotension yest. Improved with fluids. Ambulate today.   LOS: 3 days   Jessica Landry,Jessica Landry 03/10/2011, 8:00 AM  Agree with note written by Jessica Landry RNP  Day #2 STEMI IMI. S/P PCI and stent by Dr. Dwaine Deter. Recurrent CP and NSVT with relook cath showing an occluded acute marginal branch but a widely patent stent. Ambulating without difficulty. Exam benign. Groin with moderate ecchymosis. Had short run of NSVT last night. Will keep one more day. D/C home tomorrow. ROV with Dr. Dwaine Deter 1-2 weeks.  Jessica Landry 03/10/2011 8:08 AM

## 2011-03-10 NOTE — Progress Notes (Signed)
CARDIAC REHAB PHASE I   PRE:  Rate/Rhythm: 71 SR  BP:  Supine:  Sitting: 90/66  Standing:    SaO2: 97% RA  MODE:  Ambulation: 500 ft   POST:  Rate/Rhythem: 99  BP:  Supine:   Sitting: 112/82  Standing:    SaO2: 98% RA  2130-8657 Pt tolerated ambulation fair, gait slightly unsteady. Pt c/o hip "weakness" mid-way through walk. After walk, pt c/o mild chest discomfort, which she states began about mid-way through walk. Symptoms resolved with rest, but is intermittent. Notified RN of symptoms.  Annetta Maw

## 2011-03-11 ENCOUNTER — Encounter (HOSPITAL_COMMUNITY): Payer: Self-pay | Admitting: Cardiology

## 2011-03-11 DIAGNOSIS — Z955 Presence of coronary angioplasty implant and graft: Secondary | ICD-10-CM

## 2011-03-11 LAB — BASIC METABOLIC PANEL
Calcium: 8.9 mg/dL (ref 8.4–10.5)
GFR calc Af Amer: >90 mL/min (ref >90)
GFR calc non Af Amer: >90 mL/min (ref >90)
Glucose, Bld: 107 mg/dL — ABNORMAL HIGH (ref 70–99)
Sodium: 132 mEq/L — ABNORMAL LOW (ref 135–145)

## 2011-03-11 LAB — CBC
MCH: 28 pg (ref 26.0–34.0)
MCHC: 31.8 g/dL (ref 30.0–36.0)
Platelets: 273 10*3/uL (ref 150–400)
RBC: 3.71 MIL/uL — ABNORMAL LOW (ref 3.87–5.11)

## 2011-03-11 MED ORDER — METOPROLOL TARTRATE 25 MG PO TABS: 25.0000 mg | ORAL_TABLET | Freq: Two times a day (BID) | ORAL | Status: DC

## 2011-03-11 MED ORDER — POLYSACCHARIDE IRON 150 MG PO CAPS: 150.0000 mg | ORAL_CAPSULE | Freq: Every day | ORAL | Status: DC

## 2011-03-11 MED ORDER — ROSUVASTATIN CALCIUM 5 MG PO TABS: 5.0000 mg | ORAL_TABLET | ORAL | Status: DC

## 2011-03-11 MED ORDER — PRASUGREL HCL 10 MG PO TABS: 10.0000 mg | ORAL_TABLET | Freq: Every day | ORAL | Status: DC

## 2011-03-11 MED ORDER — ASPIRIN 81 MG PO TBEC: 81.0000 mg | DELAYED_RELEASE_TABLET | Freq: Every day | ORAL | Status: AC

## 2011-03-11 MED ORDER — EZETIMIBE 10 MG PO TABS: 10.0000 mg | ORAL_TABLET | Freq: Every day | ORAL | Status: DC

## 2011-03-11 MED ORDER — NITROGLYCERIN 0.4 MG SL SUBL: 0.4000 mg | SUBLINGUAL_TABLET | SUBLINGUAL | Status: DC | PRN

## 2011-03-11 NOTE — Discharge Instructions (Signed)
Take 1 NTG, under your tongue, while sitting.  If no relief of pain may repeat NTG, one tab every 5 minutes up to 3 tablets total over 15 minutes.  If no relief CALL 911.  If you have dizziness/lightheadness  while taking NTG, stop taking and call 911.       Call [facility] if any bleeding, swelling or drainage at cath site.  May shower, no tub baths for 48 hours for groin sticks.   Do not stop Effient, stopping could cause a heart attack   .Acute Coronary Syndrome    Acute coronary syndrome (ACS) is an urgent problem in which the blood and oxygen supply to the heart is critically deficient. ACS requires hospitalization because one or more coronary arteries may be blocked. ACS represents a range of conditions including:  Previous angina that is now unstable, lasts longer, happens at rest, or is more intense.   A heart attack, with heart muscle cell injury and death.  There are three vital coronary arteries that supply the heart muscle with blood and oxygen so that it can pump blood effectively. If blockages to these arteries develop, blood flow to the heart muscle is reduced. If the heart does not get enough blood, angina may occur as the first warning sign. SYMPTOMS   The most common signs of angina include:   Tightness or squeezing in the chest.   Feeling of heaviness on the chest.   Discomfort in the arms, neck, or jaw.   Shortness of breath and nausea.   Cold, wet skin.   Angina is usually brought on by physical effort or excitement which increase the oxygen needs of the heart. These states increase the blood flow needs of the heart beyond what can be delivered.  TREATMENT   Medicines to help discomfort may include nitroglycerin (nitro) in the form of tablets or a spray for rapid relief, or longer-acting forms such as cream, patches, or capsules. (Be aware that there are many side effects and possible interactions with other drugs).   Other  medicines may be used to help the heart pump better.   Procedures to open blocked arteries including angioplasty or stent placement to keep the arteries open.   Open heart surgery may be needed when there are many blockages or they are in critical locations that are best treated with surgery.  HOME CARE INSTRUCTIONS   Avoid smoking.   Take one baby or adult aspirin daily, if your caregiver advises. This helps reduce the risk of a heart attack.   It is very important that you follow the angina treatment prescribed by your caregiver. Make arrangements for proper follow-up care.   Eat a heart healthy diet with salt and fat restrictions as advised.   Regular exercise is good for you as long as it does not cause discomfort. Do not begin any new type of exercise until you check with your caregiver.   If you are overweight, you should lose weight.   Try to maintain normal blood lipid levels.   Keep your blood pressure under control as recommended by your caregiver.   You should tell your caregiver right away about any increase in the severity or frequency of your chest discomfort or angina attacks. When you have angina, you should stop what you are doing and sit down. This may bring relief in 3 to 5 minutes. If your caregiver has prescribed nitro, take it as directed.   If your caregiver has  given you a follow-up appointment, it is very important to keep that appointment. Not keeping the appointment could result in a chronic or permanent injury, pain, and disability. If there is any problem keeping the appointment, you must call back to this facility for assistance.  SEEK IMMEDIATE MEDICAL CARE IF:   You develop nausea, vomiting, or shortness of breath.   You feel faint, lightheaded, or pass out.   Your chest discomfort gets worse.   You are sweating or experience sudden profound fatigue.   You do not get relief of your chest pain after 3 doses of nitro.   Your discomfort lasts longer  than 15 minutes.  MAKE SURE YOU:   Understand these instructions.   Will watch your condition.   Will get help right away if you are not doing well or get worse.  Document Released: 03/26/2005 Document Revised: 12/06/2010 Document Reviewed: 10/28/2007 [facility] Patient Information 2012 [place], [place].

## 2011-03-11 NOTE — Progress Notes (Signed)
Case Management:   03/11/11 1145 Physician please write 1 month supply of Effient without refill and write second prescription for Effient with refills.   Spoke with pt. about Effient co-pay card and 30day free prescription.  Gave 30 day free prescription to [facility], [employer] fund[employer].  Gave pt. co-pay card and answered questions about Effient program.  Tera Mater, RN, BSN Case Manager # [phone].

## 2011-03-11 NOTE — Progress Notes (Signed)
Subjective:  No CP/SOB  Objective:  Temp:  [98 F (36.7 C)-98.5 F (36.9 C)] 98.5 F (36.9 C) (12/02 0521) Pulse Rate:  [67-76] 76  (12/02 0521) Resp:  [18-20] 18  (12/02 0521) BP: (102-129)/(70-80) 113/75 mmHg (12/02 0521) SpO2:  [95 %-100 %] 96 % (12/02 0521) Weight change:   Intake/Output from previous day: 12/01 0701 - 12/02 0700 In: 360 [P.O.:360] Out: -   Intake/Output from this shift:    Physical Exam: General appearance: alert, cooperative and appears stated age Neck: no adenopathy, no carotid bruit, no JVD, supple, symmetrical, trachea midline and thyroid not enlarged, symmetric, no tenderness/mass/nodules Lungs: clear to auscultation bilaterally Heart: regular rate and rhythm, S1, S2 normal, no murmur, click, rub or gallop  Lab Results: Results for orders placed during the hospital encounter of 03/07/11 (from the past 48 hour(s))  CBC     Status: Abnormal   Collection Time   03/10/11  5:00 AM      Component Value Range Comment   WBC 6.1  4.0 - 10.5 (K/uL)    RBC 3.56 (*) 3.87 - 5.11 (MIL/uL)    Hemoglobin 9.9 (*) 12.0 - 15.0 (g/dL)    HCT 16.1 (*) 09.6 - 46.0 (%)    MCV 88.2  78.0 - 100.0 (fL)    MCH 27.8  26.0 - 34.0 (pg)    MCHC 31.5  30.0 - 36.0 (g/dL)    RDW 04.5  40.9 - 81.1 (%)    Platelets 233  150 - 400 (K/uL)   CARDIAC PANEL(CRET KIN+CKTOT+MB+TROPI)     Status: Abnormal   Collection Time   03/10/11  5:00 AM      Component Value Range Comment   Total CK 127  7 - 177 (U/L)    CK, MB 6.5 (*) 0.3 - 4.0 (ng/mL) CRITICAL VALUE NOTED.  VALUE IS CONSISTENT WITH PREVIOUSLY REPORTED AND CALLED VALUE.   Troponin I 6.02 (*) <0.30 (ng/mL)    Relative Index 5.1 (*) 0.0 - 2.5    BASIC METABOLIC PANEL     Status: Abnormal   Collection Time   03/10/11  5:00 AM      Component Value Range Comment   Sodium 135  135 - 145 (mEq/L)    Potassium 3.9  3.5 - 5.1 (mEq/L)    Chloride 99  96 - 112 (mEq/L)    CO2 26  19 - 32 (mEq/L)    Glucose, Bld 144 (*) 70 - 99  (mg/dL)    BUN 11  6 - 23 (mg/dL)    Creatinine, Ser 9.14  0.50 - 1.10 (mg/dL)    Calcium 8.6  8.4 - 10.5 (mg/dL)    GFR calc non Af Amer >90  >90 (mL/min)    GFR calc Af Amer >90  >90 (mL/min)   BASIC METABOLIC PANEL     Status: Abnormal   Collection Time   03/11/11  5:30 AM      Component Value Range Comment   Sodium 132 (*) 135 - 145 (mEq/L)    Potassium 4.4  3.5 - 5.1 (mEq/L)    Chloride 97  96 - 112 (mEq/L)    CO2 28  19 - 32 (mEq/L)    Glucose, Bld 107 (*) 70 - 99 (mg/dL)    BUN 12  6 - 23 (mg/dL)    Creatinine, Ser 7.82  0.50 - 1.10 (mg/dL)    Calcium 8.9  8.4 - 10.5 (mg/dL)    GFR calc non Af Amer >90  >  90 (mL/min)    GFR calc Af Amer >90  >90 (mL/min)   CBC     Status: Abnormal   Collection Time   03/11/11  5:30 AM      Component Value Range Comment   WBC 6.7  4.0 - 10.5 (K/uL)    RBC 3.71 (*) 3.87 - 5.11 (MIL/uL)    Hemoglobin 10.4 (*) 12.0 - 15.0 (g/dL)    HCT 16.1 (*) 09.6 - 46.0 (%)    MCV 88.1  78.0 - 100.0 (fL)    MCH 28.0  26.0 - 34.0 (pg)    MCHC 31.8  30.0 - 36.0 (g/dL)    RDW 04.5  40.9 - 81.1 (%)    Platelets 273  150 - 400 (K/uL)     Imaging: Imaging results have been reviewed  Assessment/Plan:   1. Principal Problem: 2.  *STEMI (ST elevation myocardial infarction) 3. Active Problems: 4.  CAD (coronary artery disease), native coronary artery 5.  Hyperlipemia 6.  Depression 7.  Smoking 8.  Iron deficiency anemia 9.   Time Spent Directly with Patient:  20 minutes  Length of Stay:  LOS: 4 days    Maryruth Apple J 03/11/2011, 8:58 AM  Day #4 Inferior STEMI S/P PCI/Stent by Dr. [facility] with DES. Had some NSVT with relook cath showing a widely patent stent with an occluded AM branch. Had some mild CP yesterday. Meds adjusted. Labs OK. Exam benign. OK for DC home on ASA and effient, BB, statin. ROV with Dr. Dwaine Deter 1-2 weeks.

## 2011-03-11 NOTE — Discharge Summary (Signed)
Physician Discharge Summary  Patient ID: Jessica Landry MRN: [id number] DOB/AGE: June 21, 1955 55 y.o.  Admit date: 03/07/2011 Discharge date: 03/11/2011  Discharge Diagnoses:  Principal Problem:  *STEMI (ST elevation myocardial infarction) Active Problems:  CAD (coronary artery disease), native coronary artery  Presence of stent in right coronary artery  Hyperlipemia  Smoking  Depression  Iron deficiency anemia   Discharged Condition: good  Hospital Course: 55 year old white, separated female presented to the Cath Lab with ST elevation MI 03/07/2011 just prior to midnight. 10 acute inferior lateral EKG changes with ST elevation in leads 2, 3, aVF and reciprocal changes in leads 1, aVL, V2 and V3. She is brought emergently to the cath lab for rescue PTCA and stent. She had no prior history of cardiac disease.  The patient had been feeding her dogs developed chest pain initially diaphoretic and then developed nausea and shortness of breath. EMS was called with her significant other arrived home and by that time the chest pain is actually increased in severity.  In the Cath Lab she was found to have 100 percent occlusion of the RCA, she then underwent PTCA and stent deployment with a Promus drug-eluting stent..  She was then admitted to the cardiac intensive care unit.  The next morning she began having chest pain again as well as nonsustained ventricular tachycardia. Her pain did not resolve with IV heparin nitroglycerin sublingual and then IV and finally morphine. She was taken back to the cardiac Cath Lab by Dr. Herbie Baltimore and was found to have widely patent RCA stent and several very small branches of the right ventricle branch of the coronary artery had 2 branches that came off from within the stent the second branch was jailed with approximately 90% ostial stenosis and sluggish flow which was felt to be the culprit for her recurrent chest pain.  She continued with occasional runs of  nonsustained ventricular tachycardia her beta blocker was adjusted. On the second day she also became hypotensive with systolic blood pressure in the 80s  And fluid boluses brought her blood pressure back up.  She was transferred to the telemetry bed he relayed with cardiac rehabilitation and was feeling much improved.  Her LDL cholesterol was very elevated at 169 despite Crestor 5 mg 2 times a week. We attempted to increase the dose but patient refused stating she had tried all statins and they all cause her to have significant leg aching. She agreed 2 increase the Crestor to 3 times a week. We also added that he had to her medical regimen.  She was discharged 03/11/2011 day for inferior ST elevation MI. She was given 30 day free of effient card.  Consults: none  Significant Diagnostic Studies: laboratory data: Cardiac enzymes initial CK was 517 with an MB of 50.7 and a troponin of 9.6, followup CK was 1000 112 MB 125.8 and troponin greater than 25. On December 1 CK was 127 with MB 6.5 and troponin of 6.02.  Chemistry at discharge sodium 132 potassium 4.4 chloride 97 CO2 28 BUN 12 creatinine 0.77 glucose 107  Hemoglobin 10.4 hematocrit 32.7 CBC 6.7 and platelets 273, patient's hemoglobin on admission was 10 with hematocrit of 31.5 we did do iron studies iron was found to be 24 units IBC 362 TIBC 386 saturation ratio was 6 ferritin was 11 and folate was 5.2 he was started on iron.  Total cholesterol 266 triglycerides 113 HDL 74 and LDL 169 UA was clear, MRSA screen was negative, TSH was 3.8 echo,  hemoglobin A1c was 5.9.  Initial EKG as described previously with ST elevation followup EKGs were improved with resolution of ST elevation in resolution of reciprocal changes.  Chest x-ray revealed no acute findings.  Discharge Exam: Blood pressure 113/75, pulse 76, temperature 98.5 F (36.9 C), temperature source Oral, resp. rate 18, height 5\' 8"  (1.727 m), weight 78.8 kg (173 lb 11.6 oz), SpO2  96.00%.  Exam at discharge was stable she was seen and examined by Dr. York Ram please see his progress note.   Disposition: Home or Self Care   Discharge Medication List as of 03/11/2011  2:57 PM    START taking these medications   Details  aspirin 81 MG EC tablet Take 1 tablet (81 mg total) by mouth daily. Swallow whole., Starting 03/11/2011, Until Mon 03/10/12, Print    ezetimibe (ZETIA) 10 MG tablet Take 1 tablet (10 mg total) by mouth daily., Starting 03/11/2011, Until Mon 03/10/12, Print    metoprolol tartrate (LOPRESSOR) 25 MG tablet Take 1 tablet (25 mg total) by mouth 2 (two) times daily., Starting 03/11/2011, Until Mon 03/10/12, Print    nitroGLYCERIN (NITROSTAT) 0.4 MG SL tablet Place 1 tablet (0.4 mg total) under the tongue every 5 (five) minutes as needed for chest pain., Starting 03/11/2011, Until Mon 03/10/12, Print    polysaccharide iron (NIFEREX) 150 MG CAPS capsule Take 1 capsule (150 mg total) by mouth daily., Starting 03/11/2011, Until Discontinued, Print    prasugrel (EFFIENT) 10 MG TABS Take 1 tablet (10 mg total) by mouth daily., Starting 03/11/2011, Until Discontinued, Print      CONTINUE these medications which have CHANGED   Details  rosuvastatin (CRESTOR) 5 MG tablet Take 1 tablet (5 mg total) by mouth 3 (three) times a week. Takes on Tues and Thurs, Starting 03/11/2011, Until Discontinued, Print      CONTINUE these medications which have NOT CHANGED   Details  buPROPion (WELLBUTRIN XL) 150 MG 24 hr tablet Take 150 mg by mouth daily.  , Until Discontinued, Historical Med    estrogens, conjugated, (PREMARIN) 0.625 MG tablet Take 0.625 mg by mouth daily. Take daily for 21 days then do not take for 7 days. , Until Discontinued, Historical Med    gabapentin (NEURONTIN) 300 MG capsule Take 300 mg by mouth 2 (two) times daily.  , Until Discontinued, Historical Med    meloxicam (MOBIC) 15 MG tablet Take 15 mg by mouth daily.  , Until Discontinued, Historical Med      omeprazole (PRILOSEC OTC) 20 MG tablet Take 20 mg by mouth daily.  , Until Discontinued, Historical Med    venlafaxine (EFFEXOR-XR) 150 MG 24 hr capsule Take 150 mg by mouth daily.  , Until Discontinued, Historical Med      STOP taking these medications     atenolol (TENORMIN) 25 MG tablet        Follow-up Information    Follow up with HARDING,DAVID W. (We will see you in 1 week the office will call Monday with date and time.)    Contact information:   [facility] [address] [place] [address] [phone]          Signed: Leone Brand 03/11/2011, 6:38 PM

## 2011-03-27 HISTORY — PX: DOPPLER ECHOCARDIOGRAPHY: SHX263

## 2011-03-27 HISTORY — PX: TRANSTHORACIC ECHOCARDIOGRAM: SHX275

## 2011-11-23 ENCOUNTER — Emergency Department (HOSPITAL_COMMUNITY)
Admission: EM | Admit: 2011-11-23 | Discharge: 2011-11-23 | Disposition: A | Discharge status: Home / Self Care | Payer: BC Managed Care – PPO | Attending: Emergency Medicine | Admitting: Emergency Medicine

## 2011-11-23 ENCOUNTER — Encounter (HOSPITAL_COMMUNITY): Payer: Self-pay | Admitting: *Deleted

## 2011-11-23 ENCOUNTER — Emergency Department (HOSPITAL_COMMUNITY): Payer: BC Managed Care – PPO

## 2011-11-23 DIAGNOSIS — I2510 Atherosclerotic heart disease of native coronary artery without angina pectoris: Secondary | ICD-10-CM | POA: Insufficient documentation

## 2011-11-23 DIAGNOSIS — Z87891 Personal history of nicotine dependence: Secondary | ICD-10-CM

## 2011-11-23 DIAGNOSIS — Z955 Presence of coronary angioplasty implant and graft: Secondary | ICD-10-CM

## 2011-11-23 DIAGNOSIS — K219 Gastro-esophageal reflux disease without esophagitis: Secondary | ICD-10-CM | POA: Insufficient documentation

## 2011-11-23 DIAGNOSIS — I10 Essential (primary) hypertension: Secondary | ICD-10-CM | POA: Insufficient documentation

## 2011-11-23 DIAGNOSIS — Z79899 Other long term (current) drug therapy: Secondary | ICD-10-CM | POA: Insufficient documentation

## 2011-11-23 DIAGNOSIS — F329 Major depressive disorder, single episode, unspecified: Secondary | ICD-10-CM | POA: Insufficient documentation

## 2011-11-23 DIAGNOSIS — F17200 Nicotine dependence, unspecified, uncomplicated: Secondary | ICD-10-CM | POA: Insufficient documentation

## 2011-11-23 DIAGNOSIS — Z9089 Acquired absence of other organs: Secondary | ICD-10-CM | POA: Insufficient documentation

## 2011-11-23 DIAGNOSIS — Z862 Personal history of diseases of the blood and blood-forming organs and certain disorders involving the immune mechanism: Secondary | ICD-10-CM | POA: Diagnosis present

## 2011-11-23 DIAGNOSIS — E785 Hyperlipidemia, unspecified: Secondary | ICD-10-CM | POA: Insufficient documentation

## 2011-11-23 DIAGNOSIS — F3289 Other specified depressive episodes: Secondary | ICD-10-CM | POA: Insufficient documentation

## 2011-11-23 DIAGNOSIS — R110 Nausea: Secondary | ICD-10-CM | POA: Insufficient documentation

## 2011-11-23 DIAGNOSIS — Z7982 Long term (current) use of aspirin: Secondary | ICD-10-CM | POA: Insufficient documentation

## 2011-11-23 DIAGNOSIS — Z8739 Personal history of other diseases of the musculoskeletal system and connective tissue: Secondary | ICD-10-CM | POA: Insufficient documentation

## 2011-11-23 DIAGNOSIS — R079 Chest pain, unspecified: Secondary | ICD-10-CM

## 2011-11-23 DIAGNOSIS — Z9861 Coronary angioplasty status: Secondary | ICD-10-CM | POA: Diagnosis present

## 2011-11-23 LAB — TROPONIN I
Troponin I: <0.3 ng/mL (ref <0.30)
Troponin I: <0.3 ng/mL (ref <0.30)

## 2011-11-23 LAB — CBC WITH DIFFERENTIAL/PLATELET
Basophils Absolute: 0.1 10*3/uL (ref 0.0–0.1)
HCT: 33.6 % — ABNORMAL LOW (ref 36.0–46.0)
Lymphocytes Relative: 32 % (ref 12–46)
Neutro Abs: 3.3 10*3/uL (ref 1.7–7.7)
Platelets: 311 10*3/uL (ref 150–400)
RDW: 14.1 % (ref 11.5–15.5)
WBC: 5.7 10*3/uL (ref 4.0–10.5)

## 2011-11-23 LAB — BASIC METABOLIC PANEL
CO2: 25 mEq/L (ref 19–32)
Chloride: 98 mEq/L (ref 96–112)
Sodium: 133 mEq/L — ABNORMAL LOW (ref 135–145)

## 2011-11-23 LAB — URINALYSIS, ROUTINE W REFLEX MICROSCOPIC
Leukocytes, UA: NEGATIVE
Nitrite: NEGATIVE
Specific Gravity, Urine: 1.007 (ref 1.005–1.030)
pH: 6 (ref 5.0–8.0)

## 2011-11-23 MED ORDER — ASPIRIN 81 MG PO CHEW
324.0000 mg | CHEWABLE_TABLET | Freq: Once | ORAL | Status: AC
  Administered 2011-11-23: 324 mg via ORAL
  Filled 2011-11-23: qty 4

## 2011-11-23 NOTE — H&P (Signed)
Jessica Landry is an 56 y.o. female.   Chief Complaint: Chest pain HPI:   The patient is a 56 year old obese Caucasian female with a history of ST elevation myocardial infarction 03/07/2011 at which time she's on occlusion of the mid RCA. This was treated with Promus drug-eluting stent. She subsequent had a relook catheterization the next morning which showed widely patent stent. She also had a stress test in January 2013 at which time she exercised for 10 METS. It was negative for ischemia.  Her history also includes tobacco abuse. She quit in November 2012. She also has hyperlipidemia hypertension, borderline diabetes, significant anxiety, insomnia, iron deficiency anemia, chronic musculoskeletal pain. Vocal cord polyp removed in 2005.  Patient presents today to [facility] after developing chest pain last Wednesday. At that time was approximately 8-9/10 in intensity. She describes it "as a fist and grinding in her chest" and kind of radiated around to the left axilla and under her breasts. She tried 4 nitroglycerin sublingual which provided no relief. On Thursday the pain was considerably better she rated as a 1-3/10 in intensity and then today it is barely noticeable.   She came in because she was anxious that the pain might be heart related.  She denies diaphoresis, shortness of breath, lower extremity edema, nausea, vomiting, cough, congestion, orthopnea, PND.    Past Medical History  Diagnosis Date  . Hypertension   . STEMI (ST elevation myocardial infarction) 03/07/2011  . HTN (hypertension) 03/07/2011  . Neuropathy   . CAD (coronary artery disease), native coronary artery 03/08/2011  . Hyperlipemia 03/08/2011  . History of back surgery   . GERD (gastroesophageal reflux disease)   . Anxiety   . Arthritis   . Depression   . Depression 03/08/2011  . Iron deficiency anemia 03/09/2011  . Presence of stent in right coronary artery 03/11/2011    Past Surgical History  Procedure Date    . Cholecystectomy   . Abdominal hysterectomy   . Back surgery     Family History  Problem Relation Age of Onset  . Heart attack Mother   . Coronary artery disease Mother   . Stroke Mother   . Alzheimer's disease Father    Social History:  reports that she has been smoking Cigarettes.  She has a 10 pack-year smoking history. She does not have any smokeless tobacco history on file. She reports that she drinks alcohol. She reports that she does not use illicit drugs.  Allergies:  Allergies  Allergen Reactions  . Bee Venom Anaphylaxis  . Etodolac   . Sulfa Antibiotics Other (See Comments)    unknown     (Not in a hospital admission)  Results for orders placed during the hospital encounter of 11/23/11 (from the past 48 hour(s))  CBC WITH DIFFERENTIAL     Status: Abnormal   Collection Time   11/23/11 12:50 PM      Component Value Range Comment   WBC 5.7  4.0 - 10.5 K/uL    RBC 3.89  3.87 - 5.11 MIL/uL    Hemoglobin 10.7 (*) 12.0 - 15.0 g/dL    HCT 16.1 (*) 09.6 - 46.0 %    MCV 86.4  78.0 - 100.0 fL    MCH 27.5  26.0 - 34.0 pg    MCHC 31.8  30.0 - 36.0 g/dL    RDW 04.5  40.9 - 81.1 %    Platelets 311  150 - 400 K/uL    Neutrophils Relative 58  43 - 77 %    Neutro Abs 3.3  1.7 - 7.7 K/uL    Lymphocytes Relative 32  12 - 46 %    Lymphs Abs 1.8  0.7 - 4.0 K/uL    Monocytes Relative 6  3 - 12 %    Monocytes Absolute 0.3  0.1 - 1.0 K/uL    Eosinophils Relative 3  0 - 5 %    Eosinophils Absolute 0.2  0.0 - 0.7 K/uL    Basophils Relative 1  0 - 1 %    Basophils Absolute 0.1  0.0 - 0.1 K/uL   BASIC METABOLIC PANEL     Status: Abnormal   Collection Time   11/23/11 12:50 PM      Component Value Range Comment   Sodium 133 (*) 135 - 145 mEq/L    Potassium 4.4  3.5 - 5.1 mEq/L    Chloride 98  96 - 112 mEq/L    CO2 25  19 - 32 mEq/L    Glucose, Bld 96  70 - 99 mg/dL    BUN 17  6 - 23 mg/dL    Creatinine, Ser 2.13  0.50 - 1.10 mg/dL    Calcium 9.3  8.4 - 08.6 mg/dL    GFR calc  non Af Amer 73 (*) >90 mL/min    GFR calc Af Amer 85 (*) >90 mL/min   TROPONIN I     Status: Normal   Collection Time   11/23/11 12:50 PM      Component Value Range Comment   Troponin I <0.30  <0.30 ng/mL   URINALYSIS, ROUTINE W REFLEX MICROSCOPIC     Status: Normal   Collection Time   11/23/11  1:01 PM      Component Value Range Comment   Color, Urine YELLOW  YELLOW    APPearance CLEAR  CLEAR    Specific Gravity, Urine 1.007  1.005 - 1.030    pH 6.0  5.0 - 8.0    Glucose, UA NEGATIVE  NEGATIVE mg/dL    Hgb urine dipstick NEGATIVE  NEGATIVE    Bilirubin Urine NEGATIVE  NEGATIVE    Ketones, ur NEGATIVE  NEGATIVE mg/dL    Protein, ur NEGATIVE  NEGATIVE mg/dL    Urobilinogen, UA 0.2  0.0 - 1.0 mg/dL    Nitrite NEGATIVE  NEGATIVE    Leukocytes, UA NEGATIVE  NEGATIVE MICROSCOPIC NOT DONE ON URINES WITH NEGATIVE PROTEIN, BLOOD, LEUKOCYTES, NITRITE, OR GLUCOSE <1000 mg/dL.  TROPONIN I     Status: Normal   Collection Time   11/23/11  2:57 PM      Component Value Range Comment   Troponin I <0.30  <0.30 ng/mL    Dg Chest Port 1 View  11/23/2011  *RADIOLOGY REPORT*  Clinical Data: Chest pain, hypertension  PORTABLE CHEST - 1 VIEW  Comparison: Portable chest x-ray of 03/08/2011  Findings: The lungs are clear.  Mediastinal contours appear normal. The heart is within upper limits of normal in size.  No bony abnormality is seen.  IMPRESSION: No active lung disease.  Original Report Authenticated By: Juline Patch, M.D.    Review of Systems  Constitutional: Negative for fever and diaphoresis.  HENT: Positive for neck pain (She describes neck pain left side). Negative for congestion and sore throat.   Eyes: Negative for blurred vision and double vision.  Respiratory: Negative for cough, shortness of breath and wheezing.   Cardiovascular: Positive for chest pain. Negative for palpitations, orthopnea, claudication, leg swelling and  PND.  Gastrointestinal: Positive for constipation. Negative for  nausea, vomiting, abdominal pain, diarrhea, blood in stool and melena.  Genitourinary: Negative for dysuria and hematuria.  Neurological: Negative for dizziness and headaches.    Blood pressure 109/59, pulse 77, temperature 98.9 F (37.2 C), temperature source Oral, resp. rate 18, SpO2 98.00%. Physical Exam  Constitutional: She is oriented to person, place, and time. No distress.       Obese, deconditioned female  HENT:  Head: Normocephalic and atraumatic.  Mouth/Throat: Oropharynx is clear and moist. No oropharyngeal exudate.  Eyes: EOM are normal. Pupils are equal, round, and reactive to light. No scleral icterus.  Neck: Normal range of motion. Neck supple. No JVD present. No thyromegaly present.       Nontender  Cardiovascular: Normal rate, regular rhythm, S1 normal and S2 normal.   No murmur heard. Pulses:      Radial pulses are 2+ on the right side, and 2+ on the left side.       Dorsalis pedis pulses are 2+ on the right side, and 2+ on the left side.       No carotid Bruit.  Respiratory: Effort normal and breath sounds normal. She has no wheezes. She has no rales.  GI: Soft. Bowel sounds are normal. She exhibits no distension. There is no tenderness.  Musculoskeletal: She exhibits no edema.       No LEE   Lymphadenopathy:    She has no cervical adenopathy.  Neurological: She is alert and oriented to person, place, and time. She exhibits normal muscle tone.  Skin: Skin is warm and dry. Rash (Both feet.  Extremely dry, flakey skin ) noted.  Psychiatric: She has a normal mood and affect.     Assessment/Plan Patient Active Hospital Problem List: Chest pain (11/23/2011) CAD (coronary artery disease), native coronary artery (03/08/2011) Hyperlipemia (03/08/2011) Iron deficiency anemia (03/09/2011) Presence of stent in right coronary artery (03/11/2011) History of tobacco abuse: Quit in November 2012 (11/23/2011)  Plan: Cardiac markers have been negative x2.  EKG shows normal  sinus rhythm without acute changes.  Non ischemic exercise stress test in Jan 2013.   She quit smoking when she had the STEMI.  She is taking all meds as prescribed and since April had the crestor titrated up to 10mg  daily.   CP appears atypical.  It was worse when she laid on her side and did not respond to four SL NTG Q32min.  BP and HR well controlled.  Recommend DC home.   Tahje Borawski 11/23/2011, 3:51 PM

## 2011-11-23 NOTE — ED Notes (Signed)
Pt reports left sided chest pain since Wednesday, reports took nitro at home and eased it off for a little wednesday. Pt reports pain has been constant associated with nausea. Pt very tearful in triage.

## 2011-11-23 NOTE — ED Provider Notes (Signed)
History     CSN: [id number]  Arrival date & time 11/23/11  6295   First MD Initiated Contact with Patient 11/23/11 1004      Chief Complaint  Patient presents with  . Chest Pain  . Nausea    (Consider location/radiation/quality/duration/timing/severity/associated sxs/prior treatment) HPI Comments: Jessica Landry is a 56 y.o. Female who's had persistent chest pain that waxes and wanes for 4 days. She took nitroglycerin 3 or 4 days ago, but none today. She took aspirin this morning at 5:30. She has a vague sensation in her chest. That has been 8/10, at the worst, and now is 4/10. She had STEMI with PTCA, RCA last year. No recent fever, chills, nausea, vomiting, cough, shortness of breath, change in bowel or urinary habits. She's been able to work these last several days.  Patient is a 56 y.o. female presenting with chest pain. The history is provided by the patient.  Chest Pain     Past Medical History  Diagnosis Date  . Hypertension   . STEMI (ST elevation myocardial infarction) 03/07/2011  . HTN (hypertension) 03/07/2011  . Neuropathy   . CAD (coronary artery disease), native coronary artery 03/08/2011  . Hyperlipemia 03/08/2011  . History of back surgery   . GERD (gastroesophageal reflux disease)   . Anxiety   . Arthritis   . Depression   . Depression 03/08/2011  . Iron deficiency anemia 03/09/2011  . Presence of stent in right coronary artery 03/11/2011    Past Surgical History  Procedure Date  . Cholecystectomy   . Abdominal hysterectomy   . Back surgery     Family History  Problem Relation Age of Onset  . Heart attack Mother   . Coronary artery disease Mother   . Stroke Mother   . Alzheimer's disease Father     History  Substance Use Topics  . Smoking status: Current Some Day Smoker -- 0.2 packs/day for 40 years    Types: Cigarettes  . Smokeless tobacco: Not on file  . Alcohol Use: Yes     occasional    OB History    Grav Para Term Preterm  Abortions TAB SAB Ect Mult Living                  Review of Systems  Cardiovascular: Positive for chest pain.  All other systems reviewed and are negative.    Allergies  Bee venom; Etodolac; and Sulfa antibiotics  Home Medications   Current Outpatient Rx  Name Route Sig Dispense Refill  . ASPIRIN 81 MG PO TBEC Oral Take 1 tablet (81 mg total) by mouth daily. Swallow whole. 30 tablet 1  . BUPROPION HCL ER (XL) 150 MG PO TB24 Oral Take 150 mg by mouth daily.      Marland Kitchen ESTROGENS CONJUGATED 0.625 MG PO TABS Oral Take 0.625 mg by mouth daily. Take daily for 21 days then do not take for 7 days.     Marland Kitchen EZETIMIBE 10 MG PO TABS Oral Take 1 tablet (10 mg total) by mouth daily. 30 tablet 11  . GABAPENTIN 300 MG PO CAPS Oral Take 300 mg by mouth every evening.     . ISOSORBIDE MONONITRATE ER 30 MG PO TB24 Oral Take 30 mg by mouth every evening.    Marland Kitchen MELOXICAM 15 MG PO TABS Oral Take 15 mg by mouth every evening.     Marland Kitchen METOPROLOL TARTRATE 25 MG PO TABS Oral Take 1 tablet (25 mg total) by mouth 2 (  two) times daily. 60 tablet 11  . NITROGLYCERIN 0.4 MG SL SUBL Sublingual Place 1 tablet (0.4 mg total) under the tongue every 5 (five) minutes as needed for chest pain. 25 tablet 11  . OMEPRAZOLE MAGNESIUM 20 MG PO TBEC Oral Take 20 mg by mouth daily.      Marland Kitchen PRASUGREL HCL 10 MG PO TABS Oral Take 1 tablet (10 mg total) by mouth daily. 30 tablet 11  . ROSUVASTATIN CALCIUM 5 MG PO TABS Oral Take 5 mg by mouth every evening.    . VENLAFAXINE HCL ER 150 MG PO CP24 Oral Take 150 mg by mouth daily. Brand only      BP 109/59  Pulse 77  Temp 98.9 F (37.2 C) (Oral)  Resp 18  SpO2 98%  Physical Exam  Nursing note and vitals reviewed. Constitutional: She is oriented to person, place, and time. She appears well-developed and well-nourished.  HENT:  Head: Normocephalic and atraumatic.  Eyes: Conjunctivae and EOM are normal. Pupils are equal, round, and reactive to light.  Neck: Normal range of motion and  phonation normal. Neck supple.  Cardiovascular: Normal rate, regular rhythm and intact distal pulses.   Pulmonary/Chest: Effort normal and breath sounds normal. She exhibits no tenderness.  Abdominal: Soft. She exhibits no distension. There is no tenderness. There is no guarding.  Musculoskeletal: Normal range of motion.  Neurological: She is alert and oriented to person, place, and time. She has normal strength. She exhibits normal muscle tone.  Skin: Skin is warm and dry.  Psychiatric: Her behavior is normal. Judgment and thought content normal.       Appears anxious    ED Course  Procedures (including critical care time)    Date: 11/23/2011  Rate: 68  Rhythm: normal sinus rhythm  QRS Axis: normal  Intervals: normal  ST/T Wave abnormalities: normal  Conduction Disutrbances:none  Narrative Interpretation:   Old EKG Reviewed: unchanged  Case was discussed with her cardiologist, Dr. Herbie Baltimore; his service evaluated the patient. In emergency department, and elected to send her home.    Labs Reviewed  CBC WITH DIFFERENTIAL - Abnormal; Notable for the following:    Hemoglobin 10.7 (*)     HCT 33.6 (*)     All other components within normal limits  BASIC METABOLIC PANEL - Abnormal; Notable for the following:    Sodium 133 (*)     GFR calc non Af Amer 73 (*)     GFR calc Af Amer 85 (*)     All other components within normal limits  TROPONIN I  URINALYSIS, ROUTINE W REFLEX MICROSCOPIC  TROPONIN I  URINE CULTURE   Dg Chest Port 1 View  11/23/2011  *RADIOLOGY REPORT*  Clinical Data: Chest pain, hypertension  PORTABLE CHEST - 1 VIEW  Comparison: Portable chest x-ray of 03/08/2011  Findings: The lungs are clear.  Mediastinal contours appear normal. The heart is within upper limits of normal in size.  No bony abnormality is seen.  IMPRESSION: No active lung disease.  Original Report Authenticated By: Juline Patch, M.D.     1. Chest pain       MDM  Nonspecific chest pain,  with negative evaluation in emergency department. Doubt ACS, PE, pneumonia, or metabolic instability.   Plan: Home Medications- usual; Home Treatments- rest; Recommended follow up- Cardiology and PCP prn        Flint Melter, MD 11/24/11 1705

## 2011-11-23 NOTE — H&P (Signed)
Pt. Seen and examined. Agree with the NP/PA-C note as written. 56 yo female with a history of STEMI in 02/2011 and promus DES to the mid-RCA. She subsequently had chest pain shortly thereafter and a second look cath which was performed was negative. A myoview was performed in early 2013 which was negative and she achieved 10 METS of exercise. She now had a chest pain event in the night on Wednesday, it did not radiate and was under the left breast. It was not relieved by 4 nitroglycerin. Eventually the pain subsided on Thursday. This morning it was very mild and she thought she would call the office who directed her to the ER. Cardiac enzymes are negative x 2. She is having no chest, neck, back or shoulder pain or dyspnea. Her EKG shows normal sinus rhythm without ischemic changes. She says she has felt "progressively better about everything since her tests have been coming back normal".  I don't believe this was anginal chest pain. If she had suffered an MI, her cardiac markers or EKG would likely reflect that. I think she can be safely discharged home. She has follow-up with Dr. Herbie Baltimore in our office. She was instructed to call us back if her pain re-develops.  Chrystie Nose, MD, [facility] Attending Cardiologist [facility]

## 2011-11-23 NOTE — ED Notes (Signed)
Paged [phone] 

## 2011-11-24 LAB — URINE CULTURE

## 2012-09-29 ENCOUNTER — Other Ambulatory Visit: Payer: Self-pay | Admitting: *Deleted

## 2012-09-29 MED ORDER — BENAZEPRIL HCL 5 MG PO TABS: 5.0000 mg | ORAL_TABLET | Freq: Every day | ORAL | Status: DC

## 2012-11-09 ENCOUNTER — Encounter: Payer: Self-pay | Admitting: *Deleted

## 2012-11-11 ENCOUNTER — Ambulatory Visit (INDEPENDENT_AMBULATORY_CARE_PROVIDER_SITE_OTHER): Payer: BC Managed Care – PPO | Admitting: Cardiology

## 2012-11-11 ENCOUNTER — Encounter: Payer: Self-pay | Admitting: Cardiology

## 2012-11-11 VITALS — BP 118/72 | HR 67 | Ht 67.5 in | Wt 180.5 lb

## 2012-11-11 DIAGNOSIS — E663 Overweight: Secondary | ICD-10-CM

## 2012-11-11 DIAGNOSIS — Z6825 Body mass index (BMI) 25.0-25.9, adult: Secondary | ICD-10-CM

## 2012-11-11 DIAGNOSIS — I252 Old myocardial infarction: Secondary | ICD-10-CM

## 2012-11-11 DIAGNOSIS — F17200 Nicotine dependence, unspecified, uncomplicated: Secondary | ICD-10-CM

## 2012-11-11 DIAGNOSIS — Z79899 Other long term (current) drug therapy: Secondary | ICD-10-CM

## 2012-11-11 DIAGNOSIS — Z9861 Coronary angioplasty status: Secondary | ICD-10-CM

## 2012-11-11 DIAGNOSIS — E785 Hyperlipidemia, unspecified: Secondary | ICD-10-CM

## 2012-11-11 DIAGNOSIS — I2510 Atherosclerotic heart disease of native coronary artery without angina pectoris: Secondary | ICD-10-CM

## 2012-11-11 DIAGNOSIS — E782 Mixed hyperlipidemia: Secondary | ICD-10-CM

## 2012-11-11 MED ORDER — CLOPIDOGREL BISULFATE 75 MG PO TABS: 75.0000 mg | ORAL_TABLET | Freq: Every day | ORAL | Status: DC

## 2012-11-11 NOTE — Patient Instructions (Addendum)
Stop Effient  Start clopidogrel (PLAVIX) 75 MG one tablet by mouth once aday  HAVE Labs done CMP,LIPID  Your physician wants you to follow-up in 12 month Dr Herbie Baltimore.  You will receive a reminder letter in the mail two months in advance. If you don't receive a letter, please call our office to schedule the follow-up appointment.

## 2012-11-19 LAB — COMPREHENSIVE METABOLIC PANEL
Albumin/Globulin Ratio: 1.8 (ref 1.1–2.5)
Albumin: 4.2 g/dL (ref 3.5–5.5)
BUN: 17 mg/dL (ref 6–24)
Chloride: 101 mmol/L (ref 97–108)
Creatinine, Ser: 1.01 mg/dL — ABNORMAL HIGH (ref 0.57–1.00)
GFR calc Af Amer: 71 mL/min/{1.73_m2} (ref >59)
GFR calc non Af Amer: 62 mL/min/{1.73_m2} (ref >59)
Globulin, Total: 2.3 g/dL (ref 1.5–4.5)
Glucose: 95 mg/dL (ref 65–99)
Total Bilirubin: 0.2 mg/dL (ref 0.0–1.2)
Total Protein: 6.5 g/dL (ref 6.0–8.5)

## 2012-11-19 LAB — CBC
MCH: 26.9 pg (ref 26.6–33.0)
Platelets: 304 10*3/uL (ref 150–379)
RBC: 4.01 x10E6/uL (ref 3.77–5.28)

## 2012-11-19 LAB — LIPID PANEL
Cholesterol, Total: 250 mg/dL — ABNORMAL HIGH (ref 100–199)
LDL Calculated: 149 mg/dL — ABNORMAL HIGH (ref 0–99)
VLDL Cholesterol Cal: 15 mg/dL (ref 5–40)

## 2012-11-23 ENCOUNTER — Encounter: Payer: Self-pay | Admitting: Cardiology

## 2012-11-23 DIAGNOSIS — E663 Overweight: Secondary | ICD-10-CM | POA: Insufficient documentation

## 2012-11-23 NOTE — Assessment & Plan Note (Signed)
She is doing outstandingly well from CAD perspective. No anginal symptoms and no heart failure symptoms.  Plan: Continue beta blocker and aspirin.  D/C Effient upon completion of current pill bottle; switch to Plavix 75 mg daily using the [facility] on-line pharmacy you a prescription  Continue routine exercise.

## 2012-11-23 NOTE — Assessment & Plan Note (Signed)
She reports to me that she stopped smoking in the time of her MI. She may have neglected to mention that every now and then she will go back to smoking every now and then. We talked about the importance of staying completely off cigarettes. She can make sure she works on giving him up for good.

## 2012-11-23 NOTE — Assessment & Plan Note (Signed)
Doing well with diet next size, try to lose back the weight she gained which quit smoking. She is 10 pounds down from last visit. I congratulated her on her efforts.

## 2012-11-23 NOTE — Progress Notes (Signed)
Patient ID: Jessica Landry, female   DOB: 02/29/1956, 57 y.o.   MRN: [id number] PCP: Lorie Phenix, MD  Clinic Note: Chief Complaint  Patient presents with  . Follow-up    CAD status post inferior STEMI with RCA PCI   HPI: Jessica Landry is a 57 y.o. female with a PMH below who presents today for routine followup over coronary disease and risk factors. She is a very pleasant woman who I met in November 2012 when she presented his inferior STEMI, with an occluded RCA treated with a Promus Element DES 2.5 mm x 20 mm postdilated to 3.7 mm distally and 4.2 mm proximally. Initially post MI she had a rocky start with atypical sounding chest discomfort that was evaluated with a stress test in January 2013 which had no reversibility. She did have a fixed inferior defect that was thought to be due to scar. She was a former smoker but quit smoking and have her MI. She is doing relatively well ever since her MI with dedicated exercise and dietary modification. She's lost 10 pounds since her last visit in November.  Interval History: I last saw her in November 2013, she was doing fairly well then and continues to be doing so now. The biggest concern she has is due to financial constraints with her current medications including Effient and Crestor plus Zetia.   She continues to be very active with her walking and in doing so denies any symptoms of chest discomfort or shortness of breath with either rest or exertion. No PND, orthopnea or edema. No palpitations or lightheadedness, syncope or near syncope. She does note relatively significant constipation, but denies any nausea or vomiting. No melena, hematochezia or hematuria. She denies any TIA or amaurosis fugax symptoms. No claudication symptoms.  Past Medical History  Diagnosis Date  . Hypertension   . STEMI (ST elevation myocardial infarction) 03/07/2011  . HTN (hypertension) 03/07/2011  . Neuropathy   . CAD (coronary artery disease), native coronary  artery 03/08/2011  . History of back surgery   . GERD (gastroesophageal reflux disease)   . Anxiety   . Arthritis   . Depression   . Depression 03/08/2011  . Iron deficiency anemia 03/09/2011  . Presence of stent in right coronary artery 03/11/2011  . Hyperlipemia 03/08/2011    stain intolerance but doing okay on Crestor  . Insomnia     without significant sleeping disorders   Prior Cardiac Evaluation and Past Surgical History: Past Surgical History  Procedure Laterality Date  . Cholecystectomy    . Abdominal hysterectomy    . Back surgery    . Doppler echocardiography  03/27/2011    LV cavity is small,EF =>55%,MILD INFERIOR HYPOKINESIS; Mild Aortic Sclerosis  . Nm myocar perf wall motion  04/20/2011    EF 81% (due to small LVcavity),fixed basal to mid inferior INFARCT - NO ISCHEMIA; 10 METs  . Coronary angioplasty with stent placement  03/07/2011    inferior STEMI - RCA PROMUS 3.5 mm x 28 mm DES  (post Dil 3.7 distal to 4.2 prox)  . Cardiac catheterization  03/08/2011    improved flow from the PCI    Allergies  Allergen Reactions  . Bee Venom Anaphylaxis  . Etodolac   . Sulfa Antibiotics Other (See Comments)    unknown    Current Outpatient Prescriptions  Medication Sig Dispense Refill  . aspirin EC 81 MG tablet Take 81 mg by mouth daily.      . benazepril (LOTENSIN) 5  MG tablet Take 1 tablet (5 mg total) by mouth daily.  30 tablet  5  . buPROPion (WELLBUTRIN XL) 150 MG 24 hr tablet Take 150 mg by mouth daily.       Marland Kitchen ezetimibe (ZETIA) 10 MG tablet Take 10 mg by mouth daily.      Marland Kitchen gabapentin (NEURONTIN) 300 MG capsule Take 300 mg by mouth every evening.       . meloxicam (MOBIC) 15 MG tablet Take 15 mg by mouth every evening.       . metoprolol (LOPRESSOR) 50 MG tablet Take 50 mg by mouth 2 (two) times daily.      . nitroGLYCERIN (NITROSTAT) 0.4 MG SL tablet Place 0.4 mg under the tongue every 5 (five) minutes as needed for chest pain.      Marland Kitchen omeprazole (PRILOSEC OTC)  20 MG tablet Take 20 mg by mouth daily.        . prasugrel (EFFIENT) 10 MG TABS Take 1 tablet (10 mg total) by mouth daily.  30 tablet  11  . venlafaxine (EFFEXOR-XR) 150 MG 24 hr capsule Take 150 mg by mouth daily. Brand only       No current facility-administered medications for this visit.   not currently taking Crestor due to financial constraints.   History   Social History Narrative   Divorced woman with a now long-term partner exercises routinely on a daily basis roughly 45 minutes a day walking.  Quit smoking at the time of her MI. Does not drink.   ROS: A comprehensive Review of Systems - Negative except  pertinent positives and negatives noted above. Gastrointestinal ROS: positive for - constipation  PHYSICAL EXAM BP 118/72  Pulse 67  Ht 5' 7.5" (1.715 m)  Wt 180 lb 8 oz (81.874 kg)  BMI 27.84 kg/m2 General appearance: alert, cooperative, appears stated age, no distress and Pleasant mood and affect. Well-nourished and well-groomed. HEENT: [place]/AT, EOMI, MMM, anicteric sclera Neck: no adenopathy, no JVD, supple, symmetrical, trachea midline, thyroid not enlarged, symmetric, no tenderness/mass/nodules and Soft right-sided bruit versus referred aortic sclerosis murmur. Lungs: clear to auscultation bilaterally, normal percussion bilaterally and Nonlabored, good air movement Heart: normal apical impulse, regular rate and rhythm, S1, S2 normal, no S3 or S4, systolic murmur: systolic ejection 1/6, crescendo and decrescendo at 2nd right intercostal space, radiates to carotids, no click and no rub Abdomen: soft, non-tender; bowel sounds normal; no masses,  no organomegaly and No masses or firm fecalith palpated Extremities: extremities normal, atraumatic, no cyanosis or edema, no edema, redness or tenderness in the calves or thighs and no ulcers, gangrene or trophic changes Pulses: 2+ and symmetric Skin: Skin color, texture, turgor normal. No rashes or lesions Neurologic: Grossly  normal  [id number] today: Yes Rate: 67 , Rhythm: Normal sinus, normal ECG Recent Labs: None  ASSESSMENT / PLAN:  CAD (coronary artery disease), native coronary artery She is doing outstandingly well from CAD perspective. No anginal symptoms and no heart failure symptoms.  Plan: Continue beta blocker and aspirin.  D/C Effient upon completion of current pill bottle; switch to Plavix 75 mg daily using the [facility] on-line pharmacy you a prescription  Continue routine exercise.  Hyperlipemia She been doing very well on Crestor, however this was stopped do to financial constraints. I really would like for her to be on it, but understand to financial constraints today. She does continue to exercise, so hopefully her lipids will be better controlled.  Plan: Continue Zetia, diet and exercise  Check CMP and lipid panel  Pending results, would like to restart Crestor if possible, has been intolerant of other statins, if unable to afford Crestor, would consider using fenofibrate   Smoking She reports to me that she stopped smoking in the time of her MI. She may have neglected to mention that every now and then she will go back to smoking every now and then. We talked about the importance of staying completely off cigarettes. She can make sure she works on giving him up for good.  Overweight (BMI 25.0-29.9) Doing well with diet next size, try to lose back the weight she gained which quit smoking. She is 10 pounds down from last visit. I congratulated her on her efforts.   Orders Placed This Encounter  Procedures  . Comprehensive metabolic panel    Order Specific Question:  Has the patient fasted?    Answer:  Yes  . Lipid panel    Order Specific Question:  Has the patient fasted?    Answer:  Yes  . CBC  . EKG 12-Lead   Meds ordered this encounter  Medications  . ezetimibe (ZETIA) 10 MG tablet    Sig: Take 10 mg by mouth daily.  . metoprolol (LOPRESSOR) 50 MG tablet    Sig:  Take 50 mg by mouth 2 (two) times daily.  . nitroGLYCERIN (NITROSTAT) 0.4 MG SL tablet    Sig: Place 0.4 mg under the tongue every 5 (five) minutes as needed for chest pain.  Marland Kitchen clopidogrel (PLAVIX) 75 MG tablet    Sig: Take 1 tablet (75 mg total) by mouth daily.    Dispense:  365 tablet    Refill:  0    Please contact patient on cell phone [phone]    Followup: 12 month  DAVID W. Herbie Baltimore, M.D., M.S. [facility] [address] [place], [place]  [address]  [phone] Pager # [phone]

## 2012-11-23 NOTE — Assessment & Plan Note (Signed)
She been doing very well on Crestor, however this was stopped do to financial constraints. I really would like for her to be on it, but understand to financial constraints today. She does continue to exercise, so hopefully her lipids will be better controlled.  Plan: Continue Zetia, diet and exercise   Check CMP and lipid panel  Pending results, would like to restart Crestor if possible, has been intolerant of other statins, if unable to afford Crestor, would consider using fenofibrate

## 2012-11-26 ENCOUNTER — Telehealth: Payer: Self-pay | Admitting: *Deleted

## 2012-11-26 NOTE — Telephone Encounter (Signed)
Message copied by Tobin Chad on Wed Nov 26, 2012  2:49 PM ------      Message from: [facility], DAVID      Created: Sun Nov 23, 2012  2:53 PM       Cholestero levels look a bit better.  Still have a long way to go.  Need to stay on some sort of statin & Zetia.            Marykay Lex, MD       ------

## 2012-11-26 NOTE — Telephone Encounter (Signed)
Results given to patient.  Verbalized understanding.

## 2013-01-19 ENCOUNTER — Other Ambulatory Visit: Payer: Self-pay | Admitting: Cardiology

## 2013-01-19 NOTE — Telephone Encounter (Signed)
Rx was sent to pharmacy electronically. 

## 2013-02-23 ENCOUNTER — Other Ambulatory Visit: Payer: Self-pay | Admitting: Cardiology

## 2013-03-22 ENCOUNTER — Other Ambulatory Visit: Payer: Self-pay | Admitting: Pharmacist Clinician (PhC)/ Clinical Pharmacy Specialist

## 2013-03-23 NOTE — Telephone Encounter (Signed)
Rx was sent to pharmacy electronically. 

## 2013-04-09 HISTORY — PX: KNEE SURGERY: SHX244

## 2013-06-02 ENCOUNTER — Telehealth: Payer: Self-pay | Admitting: Pharmacist Clinician (PhC)/ Clinical Pharmacy Specialist

## 2013-06-02 NOTE — Telephone Encounter (Signed)
Pt LMOM - cannot afford zetia, almost $100/month.    Spoke with patient this am.  She has Nurse, learning disabilitycommercial insurance and needs assistance with price of Zetia.  We now have copay assistance cards and 30 day free trial cards, mailed both to patient.

## 2013-06-16 ENCOUNTER — Telehealth: Payer: Self-pay | Admitting: Cardiology

## 2013-06-16 MED ORDER — NITROGLYCERIN 0.4 MG SL SUBL: 0.4000 mg | SUBLINGUAL_TABLET | SUBLINGUAL | Status: DC | PRN

## 2013-06-16 NOTE — Telephone Encounter (Signed)
Need new prescription for her nitrostat 0.4mg . Please cal to [phone]

## 2013-06-16 NOTE — Telephone Encounter (Signed)
Refills sent to pharmacy. 

## 2013-12-21 ENCOUNTER — Other Ambulatory Visit: Payer: Self-pay | Admitting: Cardiology

## 2013-12-21 NOTE — Telephone Encounter (Signed)
Rx was sent to pharmacy electronically. 

## 2014-01-11 ENCOUNTER — Telehealth: Payer: Self-pay | Admitting: Cardiology

## 2014-01-11 MED ORDER — METOPROLOL TARTRATE 50 MG PO TABS: 50.0000 mg | ORAL_TABLET | Freq: Two times a day (BID) | ORAL | Status: DC

## 2014-01-11 NOTE — Telephone Encounter (Signed)
Pt called in stating that she is completely out of Benazepril and Metoprolol and would like it called in to the [facility] on [address] For a 30 day supply  thanks

## 2014-01-11 NOTE — Telephone Encounter (Signed)
30 days refill on metoprolol given.  Appt 01/28/14 with Dr. Herbie BaltimoreHarding.  She does have 1 refill on benazepril.

## 2014-01-21 ENCOUNTER — Telehealth: Payer: Self-pay | Admitting: Cardiology

## 2014-01-21 MED ORDER — EZETIMIBE 10 MG PO TABS: 10.0000 mg | ORAL_TABLET | Freq: Every day | ORAL | Status: DC

## 2014-01-21 MED ORDER — CLOPIDOGREL BISULFATE 75 MG PO TABS: 75.0000 mg | ORAL_TABLET | Freq: Every day | ORAL | Status: DC

## 2014-01-21 NOTE — Telephone Encounter (Signed)
Rx(s) for Plavix and Zetia sent to pharmacy electronically. Patient should not be taking both Effient and Plavix. Effient was d/c'd at last office visit with Dr Herbie BaltimoreHarding (11/11/2012). Patient MUST keep appointment for future refills.

## 2014-01-21 NOTE — Telephone Encounter (Signed)
Pt called in stating that she is out of her Clopidegrel , Zetia,and Prasugrel and she would like them called in to the [facility] on [address]   Thanks

## 2014-01-22 NOTE — Telephone Encounter (Signed)
Close encounter 

## 2014-01-28 ENCOUNTER — Ambulatory Visit: Payer: BC Managed Care – PPO | Admitting: Cardiology

## 2014-02-08 ENCOUNTER — Ambulatory Visit: Payer: Self-pay | Admitting: Nurse Practitioner

## 2014-02-12 ENCOUNTER — Telehealth: Payer: Self-pay | Admitting: Cardiology

## 2014-02-12 NOTE — Telephone Encounter (Signed)
Heather from [facility] wanted us to fax office note and EKG to [phone] on 02/12/2014. cbr

## 2014-02-15 ENCOUNTER — Telehealth: Payer: Self-pay | Admitting: Cardiology

## 2014-02-15 ENCOUNTER — Ambulatory Visit: Payer: Self-pay | Admitting: Anesthesiology

## 2014-02-15 NOTE — Telephone Encounter (Signed)
[facility] she stated patient needs cardiac clearance from Dr.Harding for rt knee surgery tomorrow 02/16/14. Stated patient has been off plavix since 01/2014.Spoke to Dr.Harding he advised ok to have surgery as long as patient has not been having any chest pain or sob.Spoke to patient she stated she has not had any chest pain or sob.Stated she has been feeling good heart wise.[facility] [phone].

## 2014-02-16 ENCOUNTER — Ambulatory Visit: Payer: Self-pay | Admitting: Orthopedic Surgery

## 2014-02-22 ENCOUNTER — Other Ambulatory Visit: Payer: Self-pay | Admitting: Cardiology

## 2014-02-23 NOTE — Telephone Encounter (Signed)
Pt still have not received her  Metoprolol. Would you please call today [phone].

## 2014-02-23 NOTE — Telephone Encounter (Signed)
[facility] [address] [place].Metoprolol refilled x 1 month.Advised to keep appointment with Dr.Harding 03/01/14.

## 2014-03-01 ENCOUNTER — Ambulatory Visit (INDEPENDENT_AMBULATORY_CARE_PROVIDER_SITE_OTHER): Payer: 59 | Admitting: Cardiology

## 2014-03-01 ENCOUNTER — Encounter: Payer: Self-pay | Admitting: Cardiology

## 2014-03-01 VITALS — BP 124/90 | HR 83 | Ht 67.0 in | Wt 178.4 lb

## 2014-03-01 DIAGNOSIS — Z79899 Other long term (current) drug therapy: Secondary | ICD-10-CM

## 2014-03-01 DIAGNOSIS — I2119 ST elevation (STEMI) myocardial infarction involving other coronary artery of inferior wall: Secondary | ICD-10-CM

## 2014-03-01 DIAGNOSIS — Z9861 Coronary angioplasty status: Secondary | ICD-10-CM

## 2014-03-01 DIAGNOSIS — E663 Overweight: Secondary | ICD-10-CM

## 2014-03-01 DIAGNOSIS — D509 Iron deficiency anemia, unspecified: Secondary | ICD-10-CM

## 2014-03-01 DIAGNOSIS — E785 Hyperlipidemia, unspecified: Secondary | ICD-10-CM

## 2014-03-01 DIAGNOSIS — I2510 Atherosclerotic heart disease of native coronary artery without angina pectoris: Secondary | ICD-10-CM

## 2014-03-01 NOTE — Patient Instructions (Addendum)
Your physician wants you to follow-up in 1 year with Dr. Herbie BaltimoreHarding. You will receive a reminder letter in the mail 2 months in advance. If you do not receive a letter, please call our office to schedule the follow-up appointment.  Dr. Herbie BaltimoreHarding has ordered for you to have lab work done, please be FASTING (No food or drink).  STOP taking your Aspirin

## 2014-03-02 ENCOUNTER — Other Ambulatory Visit: Payer: Self-pay | Admitting: *Deleted

## 2014-03-02 ENCOUNTER — Encounter: Payer: Self-pay | Admitting: Cardiology

## 2014-03-02 MED ORDER — EZETIMIBE 10 MG PO TABS: 10.0000 mg | ORAL_TABLET | Freq: Every day | ORAL | Status: DC

## 2014-03-02 MED ORDER — CLOPIDOGREL BISULFATE 75 MG PO TABS: 75.0000 mg | ORAL_TABLET | Freq: Every day | ORAL | Status: DC

## 2014-03-02 MED ORDER — BENAZEPRIL HCL 5 MG PO TABS: 5.0000 mg | ORAL_TABLET | Freq: Every day | ORAL | Status: DC

## 2014-03-02 MED ORDER — METOPROLOL TARTRATE 50 MG PO TABS: 50.0000 mg | ORAL_TABLET | Freq: Two times a day (BID) | ORAL | Status: DC

## 2014-03-02 NOTE — Progress Notes (Signed)
PCP: MALONEY,NANCY, MD  Clinic Note: Chief Complaint  Patient presents with  . Annual Exam    recent knee operation; NO cardiac complaints  . Coronary Artery Disease    s/p Inf STEMI with PCI to 100% RCA  . Hyperlipidemia    Statin intolerant   HPI: Jessica Landry is a 58 y.o. female with a PMH below who presents today for 1 yr f/u of CAD-PCI in setting of Inf STEMI.  Past Medical History  Diagnosis Date  . ST-segment elevation myocardial infarction (STEMI) of inferior wall 03/07/2011    RCA Occlusion --> PCI of mRCA; ECHO 03/2011: EF > 55%, MIld Inferior HK, Mild AoV Sclerosis.  Marland Kitchen. CAD S/P percutaneous coronary angioplasty 03/07/2011    a) PCI of mRCA with Promus Element DES 3.5 mm x 28 mm (4.2 -- 3.7 mm); b) Myoview 04/2011: EF 81% (small LV Cavity), fixed basal-mid Inferior Infarct, No Ischemia, 10 METS  . Essential hypertension 03/07/2011  . Hyperlipidemia with target LDL less than 70 2012     Statin Intolerance (tried Lipitor & Crestor)  . History of back surgery   . GERD (gastroesophageal reflux disease)   . Anxiety   . Depression 03/08/2011  . Iron deficiency anemia 03/09/2011  . Insomnia     without significant sleeping disorders  . Neuropathy   . Arthritis    Interval History:  Jessica Landry, Jessica MilfordFerguson) presents today for what amounts to be a 15 month followup with no major cardiac complaints. She seems to be no great mood, and is felt in the house that she is finally completed her divorce and is Jessica married her long-time partner.  She continues to be very active with her routine exercise. She has not had any other symptoms of anginal chest pain/tightness or dyspnea with rest or exertion.  She denies any heart failure symptoms of PND, orthopnea or edema. No palpitations, lightheadedness, dizziness, weakness, syncope/near syncope, or TIA/amaurosis fugax symptoms.  Unfortunately, the one thing that is concerning is that she is no longer able to tolerate  Crestor. She stopped it several months ago. She is also currently out of her Plavix prescription.  ROS: A comprehensive was performed. Review of Systems  Constitutional: Negative for weight loss.  HENT: Negative for nosebleeds.   Cardiovascular: Negative for palpitations and claudication.  Gastrointestinal: Negative for blood in stool and melena.  Genitourinary: Negative for hematuria.  Musculoskeletal: Positive for myalgias. Negative for joint pain and falls.       Had myalgias with Crestor.  Neurological: Negative for dizziness, sensory change, speech change, focal weakness, seizures and loss of consciousness.  Endo/Heme/Allergies: Does not bruise/bleed easily.  Psychiatric/Behavioral: Negative for depression and memory loss. The patient is not nervous/anxious and does not have insomnia.   All other systems reviewed and are negative.   Current Outpatient Prescriptions on File Prior to Visit  Medication Sig Dispense Refill  . aspirin EC 81 MG tablet Take 81 mg by mouth daily.    Marland Kitchen. gabapentin (NEURONTIN) 300 MG capsule Take 300 mg by mouth every evening.     . nitroGLYCERIN (NITROSTAT) 0.4 MG SL tablet Place 1 tablet (0.4 mg total) under the tongue every 5 (five) minutes as needed for chest pain (up to 3 tabs in 15 mins and then call 911). 25 tablet 1  . omeprazole (PRILOSEC OTC) 20 MG tablet Take 20 mg by mouth daily.      Marland Kitchen. venlafaxine (EFFEXOR-XR) 150 MG 24 hr capsule Take 150 mg by  mouth daily. Brand only    . meloxicam (MOBIC) 15 MG tablet Take 15 mg by mouth every evening.      No current facility-administered medications on file prior to visit.   ALLERGIES REVIEWED IN EPIC -- Jessica intolerant of Crestor as well SOCIAL AND FAMILY HISTORY REVIEWED IN EPIC -- Her mother is in poor health & close to death, Terra's heart is heavy with sadness.  Wt Readings from Last 3 Encounters:  03/01/14 178 lb 6.4 oz (80.922 kg)  11/11/12 180 lb 8 oz (81.874 kg)  03/09/11 173 lb 11.6 oz (78.8 kg)     PHYSICAL EXAM BP 124/90 mmHg  Pulse 83  Ht 5\' 7"  (1.702 m)  Wt 178 lb 6.4 oz (80.922 kg)  BMI 27.93 kg/m2 General appearance: alert, cooperative, appears stated age, no distress and Pleasant mood and affect. Well-nourished and well-groomed. HEENT: [place]/AT, EOMI, MMM, anicteric sclera Neck: no adenopathy, no JVD, supple, symmetrical, trachea midline, thyroid not enlarged, symmetric, no tenderness/mass/nodules and Soft right-sided bruit versus referred aortic sclerosis murmur. Lungs: clear to auscultation bilaterally, normal percussion bilaterally and Nonlabored, good air movement Heart: normal apical impulse, regular rate and rhythm, S1&S2 normal, no S3 or S4; 1/6 early-peaking harsh c-d SEM @ RUSB--> carotids, no click and no rub Abdomen: soft, non-tender; bowel sounds normal; no masses, no organomegaly and No masses or firm fecalith palpated Extremities: extremities normal, atraumatic, no cyanosis or edema, no edema, redness or tenderness in the calves or thighs and no ulcers, gangrene or trophic changes Pulses: 2+ and symmetric Skin: Skin color, texture, turgor normal. No rashes or lesions Neurologic: Grossly normal   Adult ECG Report  Rate: 83 ;  Rhythm: normal sinus rhythm and Non-specific ST-T wave cahnges  Narrative Interpretation: Stable EKG  Recent Labs:  None recently checked    ASSESSMENT / PLAN: ST elevation myocardial infarction (STEMI) of inferior wall, subsequent episode of care No signs or symptoms of heart failure or angina. Well-preserved EF by echo and nuclear stress test. On a stable regimen.  CAD S/P PCI mRCA: Promus Element DES 3.5 mm x 28 mm (4.2-37 mm)) She continues to be doing an outstanding well with no active angina symptoms. She is very active with no significant symptoms.  Continue Plavix, but OK to stop ASA  On BB & ACE-I  Statin Intolerant  Stay active / exercise  Hyperlipidemia with target LDL less than 70; Statin Intolerant She initially  was on atorvastatin, but did not do well. We switched him to Crestor which initially she tolerated. She was able to get back onto it, but of late has been noticing more intolerance with myalgias on Crestor as well. Plan:  Continue Zetia  Refer to Clinical Pharmacist team Phillips Hay(Kristin Alvstad) for evaluation for possible PCK9-Inhibitor therapy   If not, consider fenofibrate  Check f/u Lipid panel & CMP since  Iron deficiency anemia On Plavix - will check CBC along with Lipid panel & CMP  Overweight (BMI 25.0-29.9) No furhter weight loss, but remains stable with modified diet & continued exercise.    Orders Placed This Encounter  Procedures  . Comprehensive metabolic panel  . CBC  . Lipid panel  . EKG 12-Lead   Meds ordered this encounter  Medications  . senna-docusate (SENOKOT-S) 8.6-50 MG per tablet    Sig: Take by mouth.  Marland Kitchen. buPROPion (WELLBUTRIN XL) 300 MG 24 hr tablet    Sig: Take 300 mg by mouth daily.    Followup: one year   HARDING,DAVID W,  M.D., M.S. Interventional Cardiologist   Pager # [phone]

## 2014-03-02 NOTE — Assessment & Plan Note (Addendum)
She continues to be doing an outstanding well with no active angina symptoms. She is very active with no significant symptoms.  Continue Plavix, but OK to stop ASA  On BB & ACE-I  Statin Intolerant  Stay active / exercise

## 2014-03-02 NOTE — Assessment & Plan Note (Signed)
On Plavix - will check CBC along with Lipid panel & CMP

## 2014-03-02 NOTE — Assessment & Plan Note (Signed)
No furhter weight loss, but remains stable with modified diet & continued exercise.

## 2014-03-02 NOTE — Assessment & Plan Note (Signed)
No signs or symptoms of heart failure or angina. Well-preserved EF by echo and nuclear stress test. On a stable regimen.

## 2014-03-02 NOTE — Assessment & Plan Note (Addendum)
She initially was on atorvastatin, but did not do well. We switched him to Crestor which initially she tolerated. She was able to get back onto it, but of late has been noticing more intolerance with myalgias on Crestor as well. Plan:  Continue Zetia  Refer to Clinical Pharmacist team Phillips Hay(Kristin Alvstad) for evaluation for possible PCK9-Inhibitor therapy   If not, consider fenofibrate  Check f/u Lipid panel & CMP since

## 2014-03-02 NOTE — Progress Notes (Signed)
Refills sent to [facility] for 4 medications.

## 2014-03-03 ENCOUNTER — Ambulatory Visit: Payer: Self-pay | Admitting: Nurse Practitioner

## 2014-03-17 ENCOUNTER — Encounter (HOSPITAL_COMMUNITY): Payer: Self-pay | Admitting: Cardiology

## 2014-04-09 HISTORY — PX: DENTAL SURGERY: SHX609

## 2014-06-29 LAB — COMPREHENSIVE METABOLIC PANEL
A/G RATIO: 1.9 (ref 1.1–2.5)
ALT: 17 IU/L (ref 0–32)
AST: 22 IU/L (ref 0–40)
Albumin: 4.1 g/dL (ref 3.5–5.5)
Alkaline Phosphatase: 121 IU/L — ABNORMAL HIGH (ref 39–117)
BILIRUBIN TOTAL: 0.3 mg/dL (ref 0.0–1.2)
BUN/Creatinine Ratio: 13 (ref 9–23)
BUN: 13 mg/dL (ref 6–24)
CO2: 24 mmol/L (ref 18–29)
CREATININE: 1.02 mg/dL — AB (ref 0.57–1.00)
Calcium: 9.2 mg/dL (ref 8.7–10.2)
Chloride: 101 mmol/L (ref 97–108)
GFR calc Af Amer: 70 mL/min/{1.73_m2} (ref >59)
GFR, EST NON AFRICAN AMERICAN: 60 mL/min/{1.73_m2} (ref >59)
GLOBULIN, TOTAL: 2.2 g/dL (ref 1.5–4.5)
Glucose: 95 mg/dL (ref 65–99)
Potassium: 5 mmol/L (ref 3.5–5.2)
SODIUM: 140 mmol/L (ref 134–144)
Total Protein: 6.3 g/dL (ref 6.0–8.5)

## 2014-06-29 LAB — CBC
HCT: 32.9 % — ABNORMAL LOW (ref 34.0–46.6)
HEMOGLOBIN: 10.2 g/dL — AB (ref 11.1–15.9)
MCH: 25.8 pg — AB (ref 26.6–33.0)
MCHC: 31 g/dL — AB (ref 31.5–35.7)
MCV: 83 fL (ref 79–97)
Platelets: 319 10*3/uL (ref 150–379)
RBC: 3.96 x10E6/uL (ref 3.77–5.28)
RDW: 16.2 % — ABNORMAL HIGH (ref 12.3–15.4)
WBC: 4.8 10*3/uL (ref 3.4–10.8)

## 2014-06-29 LAB — LIPID PANEL
CHOLESTEROL TOTAL: 246 mg/dL — AB (ref 100–199)
Chol/HDL Ratio: 3.2 ratio units (ref 0.0–4.4)
HDL: 77 mg/dL (ref >39)
LDL Calculated: 152 mg/dL — ABNORMAL HIGH (ref 0–99)
Triglycerides: 84 mg/dL (ref 0–149)
VLDL Cholesterol Cal: 17 mg/dL (ref 5–40)

## 2014-07-30 ENCOUNTER — Encounter: Payer: Self-pay | Admitting: *Deleted

## 2014-07-31 NOTE — Op Note (Signed)
PATIENT NAME:  Jessica Landry, Jessica Landry MR#:  [id number] DATE OF BIRTH:  10/18/1955  DATE OF PROCEDURE:  02/16/2014  PREOPERATIVE DIAGNOSIS: Left knee arthritis, medial and lateral meniscus tears.   POSTOPERATIVE DIAGNOSIS: Left knee arthritis, medial and lateral meniscus tears.  PROCEDURE: Arthroscopy, left knee; partial medial and lateral meniscectomies.   ANESTHESIA: General.   SURGEON: Kennedy BuckerMichael Collins Dimaria, MD   DESCRIPTION OF PROCEDURE: The patient was brought to the operating room and after adequate anesthesia was obtained, the left leg was placed in the arthroscopic leg holder and prepped and draped in the usual sterile fashion. After patient identification and timeout procedures were completed, the inferolateral portal was made and the arthroscope was introduced. Initial inspection revealed moderate patellofemoral degenerative change with normal patellofemoral tracking with slight synovitis in the suprapatellar pouch. No loose bodies noted. Coming out of the medial compartment, an inferomedial portal was made. There was a complex tear of the posterior third of the meniscus with exposed bone along the medial rim of the medial compartment on the tibial side. The femur had associated matching lesions but it did not go all the way to the bone, but near full-thickness cartilage loss. In the notch, the ACL was intact. Going laterally, there were large tears of the posterior middle and anterior thirds of the meniscus displaced into the joint as well as an area approximately 1 x 5 mm in diameter in the posterior center aspect of the tibial condyle with exposed bone. A meniscal punch was used to address the meniscal tears initially, followed by an arthroscopic shaver, and then an ArthroCare wand to get back to stable margins, preprocedure and post procedure pictures having been obtained. After this was completed, the gutters were checked for any loose bodies. There was some old loose cartilage that was attached to the  synovium which was debrided from the lateral gutter; they are not really loose bodies however, but it was felt that removing them might lessen synovitis. No actual loose bodies present within the knee. After thorough irrigation of the knee, all instrumentation was withdrawn, preprocedure and post procedure pictures having been obtained. The wounds were closed with simple interrupted 4-0 nylon followed by 20 mL of 0.5% Sensorcaine with epinephrine. Xeroform, 4 x 4, Webril and Ace wrap applied and the patient was sent to the recovery room in stable condition.   ESTIMATED BLOOD LOSS: Minimal.   COMPLICATIONS: None.   SPECIMEN: None.    ____________________________ Leitha SchullerMichael Landry. Sacha Topor, MD mjm:TT D: 02/16/2014 21:44:47 ET T: 02/16/2014 21:55:21 ET JOB#: [id number]  cc: Leitha SchullerMichael Landry. Dhrithi Riche, MD, <Dictator> [name] MD ELECTRONICALLY SIGNED 02/17/2014 7:15

## 2014-08-03 ENCOUNTER — Other Ambulatory Visit: Payer: Self-pay

## 2014-08-03 DIAGNOSIS — Z1231 Encounter for screening mammogram for malignant neoplasm of breast: Secondary | ICD-10-CM

## 2014-08-13 ENCOUNTER — Ambulatory Visit
Admission: RE | Admit: 2014-08-13 | Discharge: 2014-08-13 | Disposition: A | Discharge status: Home / Self Care | Payer: 59 | Source: Ambulatory Visit | Attending: Family Medicine | Admitting: Family Medicine

## 2014-08-13 DIAGNOSIS — Z1231 Encounter for screening mammogram for malignant neoplasm of breast: Secondary | ICD-10-CM | POA: Diagnosis not present

## 2014-08-16 ENCOUNTER — Ambulatory Visit: Payer: Self-pay | Admitting: General Surgery

## 2014-08-23 ENCOUNTER — Encounter: Payer: Self-pay | Admitting: General Surgery

## 2014-08-24 ENCOUNTER — Encounter: Payer: Self-pay | Admitting: General Surgery

## 2014-08-24 ENCOUNTER — Ambulatory Visit (INDEPENDENT_AMBULATORY_CARE_PROVIDER_SITE_OTHER): Payer: 59 | Admitting: General Surgery

## 2014-08-24 VITALS — BP 118/74 | HR 68 | Resp 12 | Ht 68.0 in | Wt 164.0 lb

## 2014-08-24 DIAGNOSIS — Z1211 Encounter for screening for malignant neoplasm of colon: Secondary | ICD-10-CM | POA: Diagnosis not present

## 2014-08-24 NOTE — Progress Notes (Signed)
Patient ID: Jessica Landry, female   DOB: 1955-11-28, 59 y.o.   MRN: [id number]  Chief Complaint  Patient presents with  . Colonoscopy    HPI Jessica Landry is a 59 y.o. female.  Here today to discuss having a colonoscopy. She does have trouble with constipation and bowels move every other day with the help of Senna for about a year. She states her iron is low and has recenty stated a iron supplement. Here lately she has changed her diet and had been trying to eat more fruit and vegetables. She does have an external hemorrhoid that occasionally bleeds. Denies family history of colon cancer.   HPI  Past Medical History  Diagnosis Date  . ST-segment elevation myocardial infarction (STEMI) of inferior wall 03/07/2011    RCA Occlusion --> PCI of mRCA; ECHO 03/2011: EF > 55%, MIld Inferior HK, Mild AoV Sclerosis.  Marland Kitchen CAD S/P percutaneous coronary angioplasty 03/07/2011    a) PCI of mRCA with Promus Element DES 3.5 mm x 28 mm (4.2 -- 3.7 mm); b) Myoview 04/2011: EF 81% (small LV Cavity), fixed basal-mid Inferior Infarct, No Ischemia, 10 METS  . Essential hypertension 03/07/2011  . Hyperlipidemia with target LDL less than 70 2012     Statin Intolerance (tried Lipitor & Crestor)  . History of back surgery   . GERD (gastroesophageal reflux disease)   . Anxiety   . Depression 03/08/2011  . Iron deficiency anemia 03/09/2011  . Insomnia     without significant sleeping disorders  . Neuropathy   . Arthritis   . Vocal cord polyp 2005    Past Surgical History  Procedure Laterality Date  . Abdominal hysterectomy    . Back surgery    . Doppler echocardiography  03/27/2011    LV cavity is small,EF =>55%,MILD INFERIOR HYPOKINESIS; Mild Aortic Sclerosis  . Nm myocar perf wall motion  04/20/2011    EF 81% (due to small LVcavity),fixed basal to mid inferior INFARCT - NO ISCHEMIA; 10 METs  . Coronary angioplasty with stent placement  03/07/2011    inferior STEMI - RCA PROMUS 3.5 mm x 28 mm DES   (post Dil 3.7 distal to 4.2 prox)  . Cardiac catheterization  03/08/2011    improved flow from the PCI  . Left heart catheterization with coronary angiogram N/A 03/07/2011    Procedure: LEFT HEART CATHETERIZATION WITH CORONARY ANGIOGRAM;  Surgeon: Marykay Lex, MD;  Location: [facility] CATH LAB;  Service: Cardiovascular;  Laterality: N/A;  . Left heart catheterization with coronary angiogram N/A 03/08/2011    Procedure: LEFT HEART CATHETERIZATION WITH CORONARY ANGIOGRAM;  Surgeon: Marykay Lex, MD;  Location: [facility] CATH LAB;  Service: Cardiovascular;  Laterality: N/A;  . Breast biopsy Right 2001  . Cholecystectomy  2000    Dr. Lemar Livings  . Knee surgery Left 2015  . Knee surgery Right 2010    Baker cyst  . Appendectomy  2002    with adhesions, Dr Lemar Livings  . Dental surgery  2016  . Upper gi endoscopy  02-22-03    Dr. Lemar Livings    Family History  Problem Relation Age of Onset  . Heart attack Mother   . Coronary artery disease Mother   . Stroke Mother   . Alzheimer's disease Father   . Breast cancer Sister 31    Social History History  Substance Use Topics  . Smoking status: Former Smoker -- 0.25 packs/day for 40 years    Types: Cigarettes  . Smokeless tobacco: Never Used  .  Alcohol Use: 0.0 oz/week    0 Standard drinks or equivalent per week     Comment: occasional    Allergies  Allergen Reactions  . Bee Venom Anaphylaxis  . Statins Other (See Comments)    Myalgias & fatigue  . Etodolac   . Prednisone Other (See Comments)    HR increase; increased energy   . Sulfa Antibiotics Other (See Comments)    Elevated B/P, skin turns red    Current Outpatient Prescriptions  Medication Sig Dispense Refill  . benazepril (LOTENSIN) 5 MG tablet Take 1 tablet (5 mg total) by mouth daily. 90 tablet 3  . buPROPion (WELLBUTRIN XL) 300 MG 24 hr tablet Take 300 mg by mouth daily.    . clopidogrel (PLAVIX) 75 MG tablet Take 1 tablet (75 mg total) by mouth daily. 90 tablet 3  . ezetimibe  (ZETIA) 10 MG tablet Take 1 tablet (10 mg total) by mouth daily. 90 tablet 3  . gabapentin (NEURONTIN) 300 MG capsule Take 300 mg by mouth every evening.     . metoprolol (LOPRESSOR) 50 MG tablet Take 1 tablet (50 mg total) by mouth 2 (two) times daily. 180 tablet 3  . nitroGLYCERIN (NITROSTAT) 0.4 MG SL tablet Place 1 tablet (0.4 mg total) under the tongue every 5 (five) minutes as needed for chest pain (up to 3 tabs in 15 mins and then call 911). 25 tablet 1  . omeprazole (PRILOSEC OTC) 20 MG tablet Take 20 mg by mouth daily.      Marland Kitchen. senna-docusate (SENOKOT-S) 8.6-50 MG per tablet Take by mouth.    . venlafaxine (EFFEXOR-XR) 150 MG 24 hr capsule Take 150 mg by mouth daily. Brand only     No current facility-administered medications for this visit.    Review of Systems Review of Systems  Constitutional: Negative.   Respiratory: Negative.   Cardiovascular: Negative.   Gastrointestinal: Positive for constipation. Negative for nausea, vomiting and diarrhea.    Blood pressure 118/74, pulse 68, resp. rate 12, height 5\' 8"  (1.727 m), weight 164 lb (74.39 kg).  Physical Exam Physical Exam  Constitutional: She is oriented to person, place, and time. She appears well-developed and well-nourished.  Neck: Neck supple.  Cardiovascular: Normal rate, regular rhythm and normal heart sounds.   Pulmonary/Chest: Effort normal and breath sounds normal.  Abdominal: Soft. Normal appearance. There is no tenderness.  Lymphadenopathy:    She has no cervical adenopathy.  Neurological: She is alert and oriented to person, place, and time.  Skin: Skin is warm and dry.    Data Reviewed Dr. Santiago BurMaloney's records and laboratory studies from 07/29/2014 were reviewed.  Laboratory studies were notable for hemoglobin of 10.5 with an MCV of 81. MCH minimally low at 25.9. RDW elevated at 15.7. Normal differential. Platelet count 3 and 41,000. Iron saturation low at 13%, serum iron 54, normal. TIBC 421.  Assessment     Mild anemia, borderline normal chromic.  One year history mild change in stool function.    Plan    The patient had begun iron supplements before submitting samples for Hemoccult testing.  We will begin with a colonoscopy and work from that point. There were no medication changes prior to the noted changes in her stool frequency one year ago. The patient does report consuming I minimal amount of free water, and this may be contributing to her constipation.  Recognizing that she's been on a PPI after she had left the office, I do think he would be appropriate  for her to have an upper endoscopy at the same time. She may have just developed a slow onset anemia from chronic gastritis. A phone call has been placed to the patient in this regard.      Colonoscopy with possible biopsy/polypectomy prn: Information regarding the procedure, including its potential risks and complications (including but not limited to perforation of the bowel, which may require emergency surgery to repair, and bleeding) was verbally given to the patient. Educational information regarding lower instestinal endoscopy was given to the patient. Written instructions for how to complete the bowel prep using Miralax were provided. The importance of drinking ample fluids to avoid dehydration as a result of the prep emphasized.  She will need to be off Plavix for one week and take an 81 mg aspirin instead.  PCP:  Lorie PhenixMaloney, Nancy Cardiologist: Dr. Conchita ParisHarding  Byrnett, Merrily PewJeffrey W 08/24/2014, 1:44 PM

## 2014-08-24 NOTE — Patient Instructions (Addendum)
Colonoscopy A colonoscopy is an exam to look at the entire large intestine (colon). This exam can help find problems such as tumors, polyps, inflammation, and areas of bleeding. The exam takes about 1 hour.  LET [facility] Surgery[facility]OUR HEALTH CARE PROVIDER KNOW ABOUT:   Any allergies you have.  All medicines you are taking, including vitamins, herbs, eye drops, creams, and over-the-counter medicines.  Previous problems you or members of your family have had with the use of anesthetics.  Any blood disorders you have.  Previous surgeries you have had.  Medical conditions you have. RISKS AND COMPLICATIONS  Generally, this is a safe procedure. However, as with any procedure, complications can occur. Possible complications include:  Bleeding.  Tearing or rupture of the colon wall.  Reaction to medicines given during the exam.  Infection (rare). BEFORE THE PROCEDURE   Ask your health care provider about changing or stopping your regular medicines.  You may be prescribed an oral bowel prep. This involves drinking a large amount of medicated liquid, starting the day before your procedure. The liquid will cause you to have multiple loose stools until your stool is almost clear or light green. This cleans out your colon in preparation for the procedure.  Do not eat or drink anything else once you have started the bowel prep, unless your health care provider tells you it is safe to do so.  Arrange for someone to drive you home after the procedure. PROCEDURE   You will be given medicine to help you relax (sedative).  You will lie on your side with your knees bent.  A long, flexible tube with a light and camera on the end (colonoscope) will be inserted through the rectum and into the colon. The camera sends video back to a computer screen as it moves through the colon. The colonoscope also releases carbon dioxide gas to inflate the colon. This helps your health care provider see the area better.  During  the exam, your health care provider may take a small tissue sample (biopsy) to be examined under a microscope if any abnormalities are found.  The exam is finished when the entire colon has been viewed. AFTER THE PROCEDURE   Do not drive for 24 hours after the exam.  You may have a small amount of blood in your stool.  You may pass moderate amounts of gas and have mild abdominal cramping or bloating. This is caused by the gas used to inflate your colon during the exam.  Ask when your test results will be ready and how you will get your results. Make sure you get your test results. Document Released: 03/23/2000 Document Revised: 01/14/2013 Document Reviewed: 12/01/2012 [facility] Patient Information 2015 [employer], [place]. This information is not intended to replace advice given to you by your health care provider. Make sure you discuss any questions you have with your health care provider.   She will need to be off Plavix for one week and take an 81 mg aspirin instead.

## 2014-08-25 ENCOUNTER — Telehealth: Payer: Self-pay | Admitting: *Deleted

## 2014-08-25 ENCOUNTER — Encounter: Payer: Self-pay | Admitting: General Surgery

## 2014-08-25 MED ORDER — POLYETHYLENE GLYCOL 3350 17 GM/SCOOP PO POWD: ORAL | Status: DC

## 2014-08-25 NOTE — Telephone Encounter (Signed)
Patient has been scheduled for an upper and lower end[facility].   Miralax prescription has been sent to patient's pharmacy.  She will discontinue Plavix one week prior to procedure and begin an 81 mg aspirin once Plavix is stopped.

## 2014-09-23 ENCOUNTER — Telehealth: Payer: Self-pay | Admitting: *Deleted

## 2014-09-23 ENCOUNTER — Other Ambulatory Visit: Payer: Self-pay | Admitting: General Surgery

## 2014-09-23 DIAGNOSIS — R194 Change in bowel habit: Secondary | ICD-10-CM

## 2014-09-23 NOTE — H&P (Signed)
Patient ID: Jessica Landry, female DOB: 06-Jul-1955, 59 y.o. MRN: [id number]  Chief Complaint  Patient presents with  . Colonoscopy    HPI Jessica Landry is a 59 y.o. female. Here today to discuss having a colonoscopy. She does have trouble with constipation and bowels move every other day with the help of Senna for about a year. She states her iron is low and has recenty stated a iron supplement. Here lately she has changed her diet and had been trying to eat more fruit and vegetables. She does have an external hemorrhoid that occasionally bleeds. Denies family history of colon cancer.   HPI  Past Medical History  Diagnosis Date  . ST-segment elevation myocardial infarction (STEMI) of inferior wall 03/07/2011    RCA Occlusion --> PCI of mRCA; ECHO 03/2011: EF > 55%, MIld Inferior HK, Mild AoV Sclerosis.  Marland Kitchen CAD S/P percutaneous coronary angioplasty 03/07/2011    a) PCI of mRCA with Promus Element DES 3.5 mm x 28 mm (4.2 -- 3.7 mm); b) Myoview 04/2011: EF 81% (small LV Cavity), fixed basal-mid Inferior Infarct, No Ischemia, 10 METS  . Essential hypertension 03/07/2011  . Hyperlipidemia with target LDL less than 70 2012     Statin Intolerance (tried Lipitor & Crestor)  . History of back surgery   . GERD (gastroesophageal reflux disease)   . Anxiety   . Depression 03/08/2011  . Iron deficiency anemia 03/09/2011  . Insomnia     without significant sleeping disorders  . Neuropathy   . Arthritis   . Vocal cord polyp 2005    Past Surgical History  Procedure Laterality Date  . Abdominal hysterectomy    . Back surgery    . Doppler echocardiography  03/27/2011    LV cavity is small,EF =>55%,MILD INFERIOR HYPOKINESIS; Mild Aortic Sclerosis  . Nm myocar perf wall motion  04/20/2011    EF 81% (due to small LVcavity),fixed basal to mid inferior INFARCT - NO ISCHEMIA; 10 METs  . Coronary  angioplasty with stent placement  03/07/2011    inferior STEMI - RCA PROMUS 3.5 mm x 28 mm DES (post Dil 3.7 distal to 4.2 prox)  . Cardiac catheterization  03/08/2011    improved flow from the PCI  . Left heart catheterization with coronary angiogram N/A 03/07/2011    Procedure: LEFT HEART CATHETERIZATION WITH CORONARY ANGIOGRAM; Surgeon: Marykay Lex, MD; Location: [facility] CATH LAB; Service: Cardiovascular; Laterality: N/A;  . Left heart catheterization with coronary angiogram N/A 03/08/2011    Procedure: LEFT HEART CATHETERIZATION WITH CORONARY ANGIOGRAM; Surgeon: Marykay Lex, MD; Location: [facility] CATH LAB; Service: Cardiovascular; Laterality: N/A;  . Breast biopsy Right 2001  . Cholecystectomy  2000    Dr. Lemar Livings  . Knee surgery Left 2015  . Knee surgery Right 2010    Baker cyst  . Appendectomy  2002    with adhesions, Dr Lemar Livings  . Dental surgery  2016  . Upper gi endoscopy  02-22-03    Dr. Lemar Livings    Family History  Problem Relation Age of Onset  . Heart attack Mother   . Coronary artery disease Mother   . Stroke Mother   . Alzheimer's disease Father   . Breast cancer Sister 48    Social History History  Substance Use Topics  . Smoking status: Former Smoker -- 0.25 packs/day for 40 years    Types: Cigarettes  . Smokeless tobacco: Never Used  . Alcohol Use: 0.0 oz/week    0 Standard drinks or equivalent per  week     Comment: occasional    Allergies  Allergen Reactions  . Bee Venom Anaphylaxis  . Statins Other (See Comments)    Myalgias & fatigue  . Etodolac   . Prednisone Other (See Comments)    HR increase; increased energy   . Sulfa Antibiotics Other (See Comments)    Elevated B/P, skin turns red    Current Outpatient Prescriptions  Medication Sig Dispense Refill  . benazepril (LOTENSIN) 5 MG  tablet Take 1 tablet (5 mg total) by mouth daily. 90 tablet 3  . buPROPion (WELLBUTRIN XL) 300 MG 24 hr tablet Take 300 mg by mouth daily.    . clopidogrel (PLAVIX) 75 MG tablet Take 1 tablet (75 mg total) by mouth daily. 90 tablet 3  . ezetimibe (ZETIA) 10 MG tablet Take 1 tablet (10 mg total) by mouth daily. 90 tablet 3  . gabapentin (NEURONTIN) 300 MG capsule Take 300 mg by mouth every evening.     . metoprolol (LOPRESSOR) 50 MG tablet Take 1 tablet (50 mg total) by mouth 2 (two) times daily. 180 tablet 3  . nitroGLYCERIN (NITROSTAT) 0.4 MG SL tablet Place 1 tablet (0.4 mg total) under the tongue every 5 (five) minutes as needed for chest pain (up to 3 tabs in 15 mins and then call 911). 25 tablet 1  . omeprazole (PRILOSEC OTC) 20 MG tablet Take 20 mg by mouth daily.     Marland Kitchen senna-docusate (SENOKOT-S) 8.6-50 MG per tablet Take by mouth.    . venlafaxine (EFFEXOR-XR) 150 MG 24 hr capsule Take 150 mg by mouth daily. Brand only     No current facility-administered medications for this visit.    Review of Systems Review of Systems  Constitutional: Negative.  Respiratory: Negative.  Cardiovascular: Negative.  Gastrointestinal: Positive for constipation. Negative for nausea, vomiting and diarrhea.    Blood pressure 118/74, pulse 68, resp. rate 12, height 5\' 8"  (1.727 m), weight 164 lb (74.39 kg).  Physical Exam Physical Exam  Constitutional: She is oriented to person, place, and time. She appears well-developed and well-nourished.  Neck: Neck supple.  Cardiovascular: Normal rate, regular rhythm and normal heart sounds.  Pulmonary/Chest: Effort normal and breath sounds normal.  Abdominal: Soft. Normal appearance. There is no tenderness.  Lymphadenopathy:   She has no cervical adenopathy.  Neurological: She is alert and oriented to person, place, and time.  Skin: Skin is warm and dry.    Data Reviewed Dr. Santiago Bur records and  laboratory studies from 07/29/2014 were reviewed.  Laboratory studies were notable for hemoglobin of 10.5 with an MCV of 81. MCH minimally low at 25.9. RDW elevated at 15.7. Normal differential. Platelet count 3 and 41,000. Iron saturation low at 13%, serum iron 54, normal. TIBC 421.  Assessment    Mild anemia, borderline normal chromic.  One year history mild change in stool function.    Plan    The patient had begun iron supplements before submitting samples for Hemoccult testing.  We will begin with a colonoscopy and work from that point. There were no medication changes prior to the noted changes in her stool frequency one year ago. The patient does report consuming a minimal amount of free water, and this may be contributing to her constipation.  Recognizing that she's been on a PPI after she had left the office, I do think he would be appropriate for her to have an upper endoscopy at the same time. She may have just developed a slow onset  anemia from chronic gastritis. A phone call has been placed to the patient in this regard.      Colonoscopy with possible biopsy/polypectomy prn: Information regarding the procedure, including its potential risks and complications (including but not limited to perforation of the bowel, which may require emergency surgery to repair, and bleeding) was verbally given to the patient. Educational information regarding lower instestinal endoscopy was given to the patient. Written instructions for how to complete the bowel prep using Miralax were provided. The importance of drinking ample fluids to avoid dehydration as a result of the prep emphasized.  She will need to be off Plavix for one week and take an 81 mg aspirin instead.  PCP: Lorie Phenix

## 2014-09-23 NOTE — Telephone Encounter (Signed)
Patient was contacted today to verify no medication changes since last office visit. She confirms.   However, when asked if she had stopped her Plavix, patient states she forgot. Patient states she did take Plavix today. Dr. Lemar Livings was informed and said we could proceed with upper and lower endoscopy as scheduled.   We will proceed with upper and lower endoscopy on 09-28-14 at [facility].   Patient instructed to call the office should she have further questions.

## 2014-09-27 ENCOUNTER — Encounter: Payer: Self-pay | Admitting: *Deleted

## 2014-09-28 ENCOUNTER — Ambulatory Visit
Admission: RE | Admit: 2014-09-28 | Discharge: 2014-09-28 | Disposition: A | Discharge status: Home / Self Care | Payer: 59 | Source: Ambulatory Visit | Attending: General Surgery | Admitting: General Surgery

## 2014-09-28 ENCOUNTER — Ambulatory Visit: Payer: 59 | Admitting: Anesthesiology

## 2014-09-28 ENCOUNTER — Encounter: Payer: Self-pay | Admitting: *Deleted

## 2014-09-28 ENCOUNTER — Encounter
Admission: RE | Disposition: A | Discharge status: Home / Self Care | Payer: Self-pay | Source: Ambulatory Visit | Attending: General Surgery

## 2014-09-28 DIAGNOSIS — M1990 Unspecified osteoarthritis, unspecified site: Secondary | ICD-10-CM | POA: Diagnosis not present

## 2014-09-28 DIAGNOSIS — F418 Other specified anxiety disorders: Secondary | ICD-10-CM | POA: Insufficient documentation

## 2014-09-28 DIAGNOSIS — Z87891 Personal history of nicotine dependence: Secondary | ICD-10-CM | POA: Diagnosis not present

## 2014-09-28 DIAGNOSIS — K219 Gastro-esophageal reflux disease without esophagitis: Secondary | ICD-10-CM | POA: Diagnosis not present

## 2014-09-28 DIAGNOSIS — K317 Polyp of stomach and duodenum: Secondary | ICD-10-CM | POA: Insufficient documentation

## 2014-09-28 DIAGNOSIS — I252 Old myocardial infarction: Secondary | ICD-10-CM | POA: Insufficient documentation

## 2014-09-28 DIAGNOSIS — I2510 Atherosclerotic heart disease of native coronary artery without angina pectoris: Secondary | ICD-10-CM | POA: Insufficient documentation

## 2014-09-28 DIAGNOSIS — R194 Change in bowel habit: Secondary | ICD-10-CM

## 2014-09-28 DIAGNOSIS — D509 Iron deficiency anemia, unspecified: Secondary | ICD-10-CM | POA: Insufficient documentation

## 2014-09-28 HISTORY — PX: COLONOSCOPY WITH PROPOFOL: SHX5780

## 2014-09-28 HISTORY — PX: ESOPHAGOGASTRODUODENOSCOPY: SHX5428

## 2014-09-28 SURGERY — COLONOSCOPY WITH PROPOFOL
Anesthesia: General

## 2014-09-28 MED ORDER — LIDOCAINE HCL (PF) 1 % IJ SOLN
INTRAMUSCULAR | Status: AC
  Administered 2014-09-28: 0.3 mL
  Filled 2014-09-28: qty 2

## 2014-09-28 MED ORDER — PROPOFOL INFUSION 10 MG/ML OPTIME
INTRAVENOUS | Status: DC | PRN
  Administered 2014-09-28: 140 ug/kg/min via INTRAVENOUS

## 2014-09-28 MED ORDER — SODIUM CHLORIDE 0.45 % IV SOLN
INTRAVENOUS | Status: DC
  Administered 2014-09-28: 1000 mL via INTRAVENOUS

## 2014-09-28 MED ORDER — SODIUM CHLORIDE 0.9 % IV SOLN
INTRAVENOUS | Status: DC | PRN
  Administered 2014-09-28 (×2): via INTRAVENOUS

## 2014-09-28 MED ORDER — SODIUM CHLORIDE 0.9 % IV SOLN: 1000.0000 mL | Freq: Once | INTRAVENOUS | Status: DC

## 2014-09-28 MED ORDER — EPHEDRINE SULFATE 50 MG/ML IJ SOLN
INTRAMUSCULAR | Status: DC | PRN
  Administered 2014-09-28 (×2): 10 mg via INTRAVENOUS

## 2014-09-28 MED ORDER — MIDAZOLAM HCL 5 MG/5ML IJ SOLN
INTRAMUSCULAR | Status: DC | PRN
  Administered 2014-09-28: 2 mg via INTRAVENOUS

## 2014-09-28 MED ORDER — LIDOCAINE HCL (PF) 1 % IJ SOLN: 5.0000 mL | Freq: Once | INTRAMUSCULAR | Status: DC

## 2014-09-28 MED ORDER — EPHEDRINE SULFATE 50 MG/ML IJ SOLN: INTRAMUSCULAR | Status: DC | PRN

## 2014-09-28 MED ORDER — PROPOFOL 10 MG/ML IV BOLUS
INTRAVENOUS | Status: DC | PRN
  Administered 2014-09-28: 20 mg via INTRAVENOUS
  Administered 2014-09-28: 50 mg via INTRAVENOUS

## 2014-09-28 MED ORDER — FENTANYL CITRATE (PF) 100 MCG/2ML IJ SOLN
INTRAMUSCULAR | Status: DC | PRN
  Administered 2014-09-28: 50 ug via INTRAVENOUS

## 2014-09-28 MED ORDER — SODIUM CHLORIDE 0.9 % IV SOLN: INTRAVENOUS | Status: DC

## 2014-09-28 NOTE — Transfer of Care (Signed)
Immediate Anesthesia Transfer of Care Note  Patient: Jessica Landry  Procedure(s) Performed: Procedure(s): COLONOSCOPY WITH PROPOFOL (N/A) ESOPHAGOGASTRODUODENOSCOPY (EGD) (N/A)  Patient Location: PACU  Anesthesia Type:General  Level of Consciousness: awake  Airway & Oxygen Therapy: Patient Spontanous Breathing and Patient connected to face mask oxygen  Post-op Assessment: Report given to RN and Post -op Vital signs reviewed and stable  Post vital signs: Reviewed and stable  Last Vitals:  Filed Vitals:   09/28/14 1249  BP: 116/77  Pulse: 62  Temp: 36.3 C  Resp: 18    Complications: No apparent anesthesia complications

## 2014-09-28 NOTE — Anesthesia Postprocedure Evaluation (Signed)
  Anesthesia Post-op Note  Patient: Jessica Landry  Procedure(s) Performed: Procedure(s): COLONOSCOPY WITH PROPOFOL (N/A) ESOPHAGOGASTRODUODENOSCOPY (EGD) (N/A)  Anesthesia type:General  Patient location: PACU  Post pain: Pain level controlled  Post assessment: Post-op Vital signs reviewed, Patient's Cardiovascular Status Stable, Respiratory Function Stable, Patent Airway and No signs of Nausea or vomiting  Post vital signs: Reviewed and stable  Last Vitals:  Filed Vitals:   09/28/14 1430  BP: 123/76  Pulse: 63  Temp: 36.2 C  Resp: 17    Level of consciousness: awake, alert  and patient cooperative  Complications: No apparent anesthesia complications

## 2014-09-28 NOTE — Discharge Instructions (Signed)
Do Not Resume Plavix until Monday, June 27.  Continue all other regular medications.  You will be contacted with pathology results.  We will arrange for an xray of the small intestine to check for any other possible sources of bleeding.

## 2014-09-28 NOTE — Anesthesia Preprocedure Evaluation (Addendum)
Anesthesia Evaluation  Patient identified by MRN, date of birth, ID band Patient awake    Reviewed: Allergy & Precautions, NPO status , Patient's Chart, lab work & pertinent test results, reviewed documented beta blocker date and time   History of Anesthesia Complications Negative for: history of anesthetic complications  Airway Mallampati: I  TM Distance: >3 FB Neck ROM: Full    Dental no notable dental hx.    Pulmonary neg pulmonary ROS, former smoker,  breath sounds clear to auscultation  Pulmonary exam normal       Cardiovascular Exercise Tolerance: Good hypertension, Pt. on medications and Pt. on home beta blockers + CAD and + Past MI Normal cardiovascular examRhythm:Regular Rate:Normal     Neuro/Psych Anxiety Depression negative neurological ROS     GI/Hepatic Neg liver ROS, GERD-  Medicated,  Endo/Other  negative endocrine ROS  Renal/GU negative Renal ROS  negative genitourinary   Musculoskeletal  (+) Arthritis -, Osteoarthritis,    Abdominal   Peds negative pediatric ROS (+)  Hematology  (+) anemia ,   Anesthesia Other Findings   Reproductive/Obstetrics negative OB ROS                            Anesthesia Physical Anesthesia Plan  ASA: III  Anesthesia Plan: General   Post-op Pain Management:    Induction: Intravenous  Airway Management Planned: Nasal Cannula  Additional Equipment:   Intra-op Plan:   Post-operative Plan:   Informed Consent: I have reviewed the patients History and Physical, chart, labs and discussed the procedure including the risks, benefits and alternatives for the proposed anesthesia with the patient or authorized representative who has indicated his/her understanding and acceptance.     Plan Discussed with: CRNA and Surgeon  Anesthesia Plan Comments:         Anesthesia Quick Evaluation

## 2014-09-28 NOTE — Op Note (Signed)
[facility] Patient Name: Jessica Landry Procedure Date: 09/28/2014 1:37 PM MRN: [id number] Account #: [id number] Date of Birth: Mar 23, 1956 Admit Type: Outpatient Age: 59 Room: [facility] ENDO ROOM 1 Gender: Female Note Status: Finalized Procedure:         Colonoscopy Indications:       Iron deficiency anemia Providers:         Earline Mayotte, MD Referring MD:      Leo Grosser, MD (Referring MD) Medicines:         Monitored Anesthesia Care Complications:     No immediate complications. Procedure:         Pre-Anesthesia Assessment:                    - Prior to the procedure, a History and Physical was                     performed, and patient medications, allergies and                     sensitivities were reviewed. The patient's tolerance of                     previous anesthesia was reviewed.                    - The risks and benefits of the procedure and the sedation                     options and risks were discussed with the patient. All                     questions were answered and informed consent was obtained.                    After obtaining informed consent, the colonoscope was                     passed under direct vision. Throughout the procedure, the                     patient's blood pressure, pulse, and oxygen saturations                     were monitored continuously. The Colonoscope was                     introduced through the anus and advanced to the the cecum,                     identified by the appendiceal orifice, IC valve and                     transillumination. The colonoscopy was performed with                     moderate difficulty due to significant looping. Successful                     completion of the procedure was aided by using manual                     pressure. The patient tolerated the procedure well. Findings:      The entire examined colon appeared normal on direct and retroflexion  views. Impression:        - The entire examined colon is normal on direct and                     retroflexion views.                    - No specimens collected. Recommendation:    - Perform a small bowel follow through at appointment to                     be scheduled. Procedure Code(s): --- Professional ---                    [id number], Colonoscopy, flexible; diagnostic, including                     collection of specimen(s) by brushing or washing, when                     performed (separate procedure) Diagnosis Code(s): --- Professional ---                    D50.9, Iron deficiency anemia, unspecified CPT copyright 2014 American Medical Association. All rights reserved. The codes documented in this report are preliminary and upon coder review may  be revised to meet current compliance requirements. Earline Mayotte, MD 09/28/2014 2:17:15 PM This report has been signed electronically. Number of Addenda: 0 Note Initiated On: 09/28/2014 1:37 PM Scope Withdrawal Time: 0 hours 6 minutes 10 seconds  Total Procedure Duration: 0 hours 20 minutes 54 seconds       [facility]

## 2014-09-28 NOTE — Op Note (Addendum)
[facility]astroenterology Patient Name: Jessica Landry Procedure Date: 09/28/2014 1:37 PM MRN: [id number] Account #: [id number] Date of Birth: [birth year] Admit Type: Outpatient Age: 59 Room: [facility] ENDO ROOM 1 Gender: Female Note Status: Supervisor Override Procedure:         Upper GI endoscopy Indications:       Iron deficiency anemia Providers:         Earline Mayotte, MD Referring MD:      Leo Grosser, MD (Referring MD) Medicines:         Monitored Anesthesia Care Complications:     No immediate complications. Procedure:         Pre-Anesthesia Assessment:                    - Prior to the procedure, a History and Physical was                     performed, and patient medications, allergies and                     sensitivities were reviewed. The patient's tolerance of                     previous anesthesia was reviewed.                    - The risks and benefits of the procedure and the sedation                     options and risks were discussed with the patient. All                     questions were answered and informed consent was obtained.                    After obtaining informed consent, the endoscope was passed                     under direct vision. Throughout the procedure, the                     patient's blood pressure, pulse, and oxygen saturations                     were monitored continuously. The Olympus GIF-160 endoscope                     (S#. [id number] [id number]) was introduced through the mouth, and                     advanced to the second part of duodenum. The upper GI                     endoscopy was accomplished without difficulty. The patient                     tolerated the procedure well. Findings:      The esophagus was normal.      Multiple 7 mm semi-sessile polyps with no bleeding and no stigmata of       recent bleeding were found in the stomach. Biopsies x 4 were taken with       a cold forceps for histology.  The examined duodenum was normal. Impression:        -  Normal esophagus.                    - Multiple gastric polyps. Biopsied.                    - Normal examined duodenum. Recommendation:    - Perform a colonoscopy today. Procedure Code(s): --- Professional ---                    [phone], Esophagogastroduodenoscopy, flexible, transoral;                     with biopsy, single or multiple Diagnosis Code(s): --- Professional ---                    K31.7, Polyp of stomach and duodenum                    D50.9, Iron deficiency anemia, unspecified CPT copyright 2014 American Medical Association. All rights reserved. The codes documented in this report are preliminary and upon coder review may  be revised to meet current compliance requirements. Earline Mayotte, MD 09/28/2014 1:52:48 PM This report has been signed electronically. Number of Addenda: 0 Note Initiated On: 09/28/2014 1:37 PM      Jessica Landry

## 2014-09-28 NOTE — H&P (Signed)
Feeling well. Tolerated prep. No cardiopulmonary symptoms. Lungs: Clear. Cardio: RR. HEENT: Neg. For EGD/ colonoscopy.

## 2014-09-29 ENCOUNTER — Encounter: Payer: Self-pay | Admitting: General Surgery

## 2014-09-30 LAB — SURGICAL PATHOLOGY

## 2014-10-01 ENCOUNTER — Telehealth: Payer: Self-pay

## 2014-10-01 ENCOUNTER — Other Ambulatory Visit: Payer: Self-pay

## 2014-10-01 DIAGNOSIS — D509 Iron deficiency anemia, unspecified: Secondary | ICD-10-CM

## 2014-10-01 NOTE — Telephone Encounter (Signed)
Spoke with patient about scheduling a small bowel follow through and she is amendable to this. Patient is scheduled for this at [facility] on 10/07/14 at 8:30 am. She is to arrive at 8:00 am and have nothing to eat or drink after midnight the night before. Patient is aware of date, time, and instructions.

## 2014-10-01 NOTE — Telephone Encounter (Signed)
-----   Message from Earline Mayotte, MD sent at 09/30/2014  8:10 PM EDT ----- Please arrange for a small bowel follow-through: Indication anemia. Thank you

## 2014-10-07 ENCOUNTER — Ambulatory Visit
Admission: RE | Admit: 2014-10-07 | Discharge: 2014-10-07 | Disposition: A | Discharge status: Home / Self Care | Payer: 59 | Source: Ambulatory Visit | Attending: General Surgery | Admitting: General Surgery

## 2014-10-07 DIAGNOSIS — D509 Iron deficiency anemia, unspecified: Secondary | ICD-10-CM | POA: Diagnosis present

## 2014-10-13 ENCOUNTER — Telehealth: Payer: Self-pay | Admitting: *Deleted

## 2014-10-13 NOTE — Telephone Encounter (Signed)
Notified patient as instructed, patient pleased. Discussed stool collection, patient agrees. Will pick up cards.

## 2014-10-13 NOTE — Telephone Encounter (Signed)
-----   Message from Earline MayotteJeffrey W Byrnett, MD sent at 10/13/2014  7:34 AM EDT ----- Please notify the patient that the upper GI series was normal. I would like her to complete a set of sick stool Hemoccults slides. This should be initiated after one week off her iron supplements. ----- Message -----    From: Rad Results In Interface    Sent: 10/07/2014  10:06 AM      To: Earline MayotteJeffrey W Byrnett, MD

## 2014-10-18 ENCOUNTER — Ambulatory Visit (INDEPENDENT_AMBULATORY_CARE_PROVIDER_SITE_OTHER): Payer: 59 | Admitting: Family Medicine

## 2014-10-18 ENCOUNTER — Encounter: Payer: Self-pay | Admitting: Family Medicine

## 2014-10-18 VITALS — BP 125/70 | HR 70 | Temp 98.3°F | Resp 16 | Wt 159.0 lb

## 2014-10-18 DIAGNOSIS — R253 Fasciculation: Secondary | ICD-10-CM

## 2014-10-18 DIAGNOSIS — F32A Depression, unspecified: Secondary | ICD-10-CM

## 2014-10-18 DIAGNOSIS — M62838 Other muscle spasm: Secondary | ICD-10-CM | POA: Diagnosis not present

## 2014-10-18 DIAGNOSIS — R258 Other abnormal involuntary movements: Secondary | ICD-10-CM

## 2014-10-18 DIAGNOSIS — F329 Major depressive disorder, single episode, unspecified: Secondary | ICD-10-CM

## 2014-10-18 NOTE — Progress Notes (Signed)
Subjective:    Patient ID: Lucio EdwardSarah Minniear, female    DOB: 12/17/1955, 59 y.o.   MRN: [id number]  HPI Depression screen PHQ 2/9 10/18/2014  Decreased Interest 2  Down, Depressed, Hopeless 2  PHQ - 2 Score 4  Altered sleeping 0  Tired, decreased energy 2  Change in appetite 3  Feeling bad or failure about yourself  3  Trouble concentrating 2  Moving slowly or fidgety/restless 0  Suicidal thoughts 0  PHQ-9 Score 14  Difficult doing work/chores Somewhat difficult   Just not getting better.  Thinks not going as well as previous.  May need a medication adjustment.  Feels self conscious about her teeth. Hoping that will make her feel some better. Is seeing Jae DireKate every 4 weeks. Scheduled to see Jae DireKate next week.  Is also having some muscle twitching.  Patient Active Problem List   Diagnosis Date Noted  . Overweight (BMI 25.0-29.9) 11/23/2012  . History of tobacco abuse: Quit in November 2012 11/23/2011  . Presence of stent in right coronary artery 03/11/2011  . Iron deficiency anemia 03/09/2011  . CAD S/P PCI mRCA: Promus Element DES 3.5 mm x 28 mm (4.2-37 mm)) 03/08/2011    Class: Diagnosis of  . Hyperlipidemia with target LDL less than 70; Statin Intolerant 03/08/2011  . Previous back surgery 03/08/2011  . Depression 03/08/2011  . ST elevation myocardial infarction (STEMI) of inferior wall, subsequent episode of care 03/07/2011    Class: Acute   Past Medical History  Diagnosis Date  . ST-segment elevation myocardial infarction (STEMI) of inferior wall 03/07/2011    RCA Occlusion --> PCI of mRCA; ECHO 03/2011: EF > 55%, MIld Inferior HK, Mild AoV Sclerosis.  Marland Kitchen. CAD S/P percutaneous coronary angioplasty 03/07/2011    a) PCI of mRCA with Promus Element DES 3.5 mm x 28 mm (4.2 -- 3.7 mm); b) Myoview 04/2011: EF 81% (small LV Cavity), fixed basal-mid Inferior Infarct, No Ischemia, 10 METS  . Essential hypertension 03/07/2011  . Hyperlipidemia with target LDL less than 70 2012     Statin  Intolerance (tried Lipitor & Crestor)  . History of back surgery   . GERD (gastroesophageal reflux disease)   . Anxiety   . Depression 03/08/2011  . Iron deficiency anemia 03/09/2011  . Insomnia     without significant sleeping disorders  . Neuropathy   . Arthritis   . Vocal cord polyp 2005   Current Outpatient Prescriptions on File Prior to Visit  Medication Sig  . benazepril (LOTENSIN) 5 MG tablet Take 1 tablet (5 mg total) by mouth daily.  Marland Kitchen. buPROPion (WELLBUTRIN XL) 300 MG 24 hr tablet Take 300 mg by mouth daily.  Marland Kitchen. ezetimibe (ZETIA) 10 MG tablet Take 1 tablet (10 mg total) by mouth daily.  . ferrous fumarate (FERRO-SEQUELS) 50 MG CR tablet Take 50 mg by mouth 3 (three) times daily with meals.  . gabapentin (NEURONTIN) 300 MG capsule Take 300 mg by mouth every evening.   . metoprolol (LOPRESSOR) 50 MG tablet Take 1 tablet (50 mg total) by mouth 2 (two) times daily.  . nitroGLYCERIN (NITROSTAT) 0.4 MG SL tablet Place 1 tablet (0.4 mg total) under the tongue every 5 (five) minutes as needed for chest pain (up to 3 tabs in 15 mins and then call 911).  Marland Kitchen. omeprazole (PRILOSEC OTC) 20 MG tablet Take 20 mg by mouth daily.    . polyethylene glycol powder (GLYCOLAX/MIRALAX) powder 255 grams one bottle for colonoscopy prep  . senna-docusate (SENOKOT-S)  8.6-50 MG per tablet Take by mouth.  . venlafaxine (EFFEXOR-XR) 150 MG 24 hr capsule Take 150 mg by mouth daily. Brand only   No current facility-administered medications on file prior to visit.   Allergies  Allergen Reactions  . Bee Venom Anaphylaxis  . Statins Other (See Comments)    Myalgias & fatigue  . Etodolac   . Prednisone Other (See Comments)    HR increase; increased energy   . Sulfa Antibiotics Other (See Comments)    Elevated B/P, skin turns red   Past Surgical History  Procedure Laterality Date  . Abdominal hysterectomy    . Back surgery    . Doppler echocardiography  03/27/2011    LV cavity is small,EF =>55%,MILD  INFERIOR HYPOKINESIS; Mild Aortic Sclerosis  . Nm myocar perf wall motion  04/20/2011    EF 81% (due to small LVcavity),fixed basal to mid inferior INFARCT - NO ISCHEMIA; 10 METs  . Coronary angioplasty with stent placement  03/07/2011    inferior STEMI - RCA PROMUS 3.5 mm x 28 mm DES  (post Dil 3.7 distal to 4.2 prox)  . Cardiac catheterization  03/08/2011    improved flow from the PCI  . Left heart catheterization with coronary angiogram N/A 03/07/2011    Procedure: LEFT HEART CATHETERIZATION WITH CORONARY ANGIOGRAM;  Surgeon: Marykay Lex, MD;  Location: [facility] CATH LAB;  Service: Cardiovascular;  Laterality: N/A;  . Left heart catheterization with coronary angiogram N/A 03/08/2011    Procedure: LEFT HEART CATHETERIZATION WITH CORONARY ANGIOGRAM;  Surgeon: Marykay Lex, MD;  Location: [facility] CATH LAB;  Service: Cardiovascular;  Laterality: N/A;  . Knee surgery Left 2015  . Knee surgery Right 2010    Baker cyst  . Dental surgery  2016  . Upper gi endoscopy  02-22-03    Dr. Lemar Livings, normal  . Breast biopsy Right 01/31/1999    fibrocystic changes, ductal adenosis, focal atypical ductal epithelium.  . Cholecystectomy  03/25/1998    Dr. Lemar Livings, chronic cholecystitis.  . Skin nodule Left 09/11/1999    dermatofibroma left lower leg, positive lateral margin.  Marland Kitchen Appendectomy  07/04/2000    Normal appendix, adhesions noted.  . Colonoscopy with propofol N/A 09/28/2014    Procedure: COLONOSCOPY WITH PROPOFOL;  Surgeon: Earline Mayotte, MD;  Location: [facility] ENDOSCOPY;  Service: Endoscopy;  Laterality: N/A;  . Esophagogastroduodenoscopy N/A 09/28/2014    Procedure: ESOPHAGOGASTRODUODENOSCOPY (EGD);  Surgeon: Earline Mayotte, MD;  Location: [facility] ENDOSCOPY;  Service: Endoscopy;  Laterality: N/A;   History   Social History  . Marital Status: Married    Spouse Name: N/A  . Number of Children: N/A  . Years of Education: N/A   Occupational History  . Not on file.   Social History Main Topics   . Smoking status: Former Smoker -- 0.25 packs/day for 40 years    Types: Cigarettes    Quit date: 03/07/2011  . Smokeless tobacco: Never Used  . Alcohol Use: 0.0 oz/week    0 Standard drinks or equivalent per week     Comment: occasional  . Drug Use: No  . Sexual Activity: Yes   Other Topics Concern  . Not on file   Social History Narrative   Divorced woman -- Now Remarried to her long-term partner.   Exercises routinely on a daily basis roughly 45 minutes a day walking.  Quit smoking at the time of her MI. Does not drink.   Family History  Problem Relation Age of Onset  .  Heart attack Mother   . Coronary artery disease Mother   . Stroke Mother   . Alzheimer's disease Father   . Breast cancer Sister 27    Review of Systems  Constitutional: Positive for activity change and appetite change. Negative for fatigue.  HENT: Positive for dental problem.   Neurological: Positive for tremors (really more muscle twitches. ).  Psychiatric/Behavioral: Positive for behavioral problems, dysphoric mood and decreased concentration. Negative for suicidal ideas.       Objective:   Physical Exam  Constitutional: She is oriented to person, place, and time. She appears well-developed and well-nourished.  Neurological: She is alert and oriented to person, place, and time.  Skin: Skin is warm and dry.  Psychiatric: She has a normal mood and affect. Her behavior is normal. Judgment and thought content normal.  Does have flat affect today. Tearful at times.     BP 125/70 mmHg  Pulse 70  Temp(Src) 98.3 F (36.8 C)  Resp 16  Wt 159 lb (72.122 kg)       Assessment & Plan:  1. Muscle twitching New. Will check labs.  - Ferritin - Sedimentation rate - Magnesium - Comprehensive metabolic panel - TSH  2. Muscle spasms of both lower extremities Will check labs.  - Ferritin - Sedimentation rate - Magnesium - Comprehensive metabolic panel - TSH  3. Depression Not improved with  medication increase. Will refer.  - Ambulatory referral to Psychiatry  Lorie Phenix, MD

## 2014-10-19 ENCOUNTER — Telehealth: Payer: Self-pay

## 2014-10-19 LAB — COMPREHENSIVE METABOLIC PANEL
ALBUMIN: 4.1 g/dL (ref 3.5–5.5)
ALT: 17 IU/L (ref 0–32)
AST: 16 IU/L (ref 0–40)
Albumin/Globulin Ratio: 2 (ref 1.1–2.5)
Alkaline Phosphatase: 94 IU/L (ref 39–117)
BUN/Creatinine Ratio: 13 (ref 9–23)
BUN: 11 mg/dL (ref 6–24)
Bilirubin Total: 0.3 mg/dL (ref 0.0–1.2)
CO2: 27 mmol/L (ref 18–29)
CREATININE: 0.85 mg/dL (ref 0.57–1.00)
Calcium: 9.5 mg/dL (ref 8.7–10.2)
Chloride: 100 mmol/L (ref 97–108)
GFR calc Af Amer: 87 mL/min/{1.73_m2} (ref >59)
GFR, EST NON AFRICAN AMERICAN: 75 mL/min/{1.73_m2} (ref >59)
Globulin, Total: 2.1 g/dL (ref 1.5–4.5)
Glucose: 86 mg/dL (ref 65–99)
Potassium: 4.5 mmol/L (ref 3.5–5.2)
Sodium: 141 mmol/L (ref 134–144)
Total Protein: 6.2 g/dL (ref 6.0–8.5)

## 2014-10-19 LAB — MAGNESIUM: MAGNESIUM: 1.9 mg/dL (ref 1.6–2.3)

## 2014-10-19 LAB — SEDIMENTATION RATE: Sed Rate: 3 mm/hr (ref 0–40)

## 2014-10-19 LAB — FERRITIN: FERRITIN: 35 ng/mL (ref 15–150)

## 2014-10-19 LAB — TSH: TSH: 3.52 u[IU]/mL (ref 0.450–4.500)

## 2014-10-19 NOTE — Telephone Encounter (Signed)
-----   Message from Lorie PhenixNancy Maloney, MD sent at 10/19/2014 10:30 AM EDT ----- Labs stable. No reason for twitching identified. Make sure to stay will hydrated and proceed with referral as discussed. Thanks.

## 2014-10-19 NOTE — Telephone Encounter (Signed)
Pt advised as directed below.   Thanks,   -Laura  

## 2014-12-07 ENCOUNTER — Encounter: Payer: Self-pay | Admitting: Psychiatry

## 2014-12-07 ENCOUNTER — Ambulatory Visit (INDEPENDENT_AMBULATORY_CARE_PROVIDER_SITE_OTHER): Payer: 59 | Admitting: Psychiatry

## 2014-12-07 VITALS — BP 118/78 | HR 73 | Temp 98.1°F | Ht 65.0 in | Wt 157.2 lb

## 2014-12-07 DIAGNOSIS — M109 Gout, unspecified: Secondary | ICD-10-CM | POA: Insufficient documentation

## 2014-12-07 DIAGNOSIS — F331 Major depressive disorder, recurrent, moderate: Secondary | ICD-10-CM | POA: Diagnosis not present

## 2014-12-07 DIAGNOSIS — Z634 Disappearance and death of family member: Secondary | ICD-10-CM

## 2014-12-07 DIAGNOSIS — Z91030 Bee allergy status: Secondary | ICD-10-CM | POA: Insufficient documentation

## 2014-12-07 DIAGNOSIS — F419 Anxiety disorder, unspecified: Secondary | ICD-10-CM | POA: Insufficient documentation

## 2014-12-07 MED ORDER — ARIPIPRAZOLE 5 MG PO TABS: 5.0000 mg | ORAL_TABLET | Freq: Every day | ORAL | Status: DC
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
## 2015-01-07 ENCOUNTER — Ambulatory Visit (INDEPENDENT_AMBULATORY_CARE_PROVIDER_SITE_OTHER): Payer: 59 | Admitting: Psychiatry

## 2015-01-07 ENCOUNTER — Encounter: Payer: Self-pay | Admitting: Psychiatry

## 2015-01-07 VITALS — BP 118/82 | HR 60 | Temp 97.6°F | Ht 65.0 in | Wt 159.6 lb

## 2015-01-07 DIAGNOSIS — F331 Major depressive disorder, recurrent, moderate: Secondary | ICD-10-CM

## 2015-01-07 DIAGNOSIS — Z634 Disappearance and death of family member: Secondary | ICD-10-CM | POA: Diagnosis not present

## 2015-01-07 MED ORDER — ARIPIPRAZOLE 5 MG PO TABS: 5.0000 mg | ORAL_TABLET | Freq: Every day | ORAL | Status: DC

## 2015-01-07 NOTE — Progress Notes (Signed)
BH MD/PA/NP OP Progress Note  01/07/2015 10:40 AM Jessica Landry  MRN:  [id number]  Subjective:  Patient returns for follow-up of her major depressive disorder, recurrent moderate and bereavement. She states she continues to have thoughts about her late mother and subsequent sadness. She states that she does engage in activities such as selling. States this is something that her mother taught her and she is appreciative of that "gift." She states that the Abilify that was added to her Wellbutrin and Effexor at the last visit did help her. She states that she feels like it stopped her from obsessing about things and that anxiety still affect her make her down and have a prolonged effect on her are more brief in duration. She states that she was taking in the morning but noticed that it was causing her some daytime sedation so she switched to taking it at bedtime. However she states that the effects on her mood are less when she takes it at bedtime. I did discuss with her that we could give her a smaller dose and she can resume taking it in the morning. Patient states she would prefer to see if she could return to the morning at the same dose hoping that her body is not used to the medication. I said this wasn't okay plan and that if she resumed having the daytime sedation to contact me and I could write her for a lower dose of Abilify. She could take 2 mg in the morning as opposed to the 5 mg in the morning which she is taking.  She stated she was able to enjoy the cruise to [place]. Chief Complaint: The Abilify helps because it "scrambles" by depressive thoughts Chief Complaint    Medication Refill; Follow-up; Depression; Insomnia     Visit Diagnosis:  No diagnosis found.  Past Medical History:  Past Medical History  Diagnosis Date  . ST-segment elevation myocardial infarction (STEMI) of inferior wall 03/07/2011    RCA Occlusion --> PCI of mRCA; ECHO 03/2011: EF > 55%, MIld Inferior HK, Mild AoV  Sclerosis.  Marland Kitchen CAD S/P percutaneous coronary angioplasty 03/07/2011    a) PCI of mRCA with Promus Element DES 3.5 mm x 28 mm (4.2 -- 3.7 mm); b) Myoview 04/2011: EF 81% (small LV Cavity), fixed basal-mid Inferior Infarct, No Ischemia, 10 METS  . Essential hypertension 03/07/2011  . Hyperlipidemia with target LDL less than 70 2012     Statin Intolerance (tried Lipitor & Crestor)  . History of back surgery   . GERD (gastroesophageal reflux disease)   . Anxiety   . Depression 03/08/2011  . Iron deficiency anemia 03/09/2011  . Insomnia     without significant sleeping disorders  . Neuropathy   . Arthritis   . Vocal cord polyp 2005    Past Surgical History  Procedure Laterality Date  . Abdominal hysterectomy    . Back surgery    . Doppler echocardiography  03/27/2011    LV cavity is small,EF =>55%,MILD INFERIOR HYPOKINESIS; Mild Aortic Sclerosis  . Nm myocar perf wall motion  04/20/2011    EF 81% (due to small LVcavity),fixed basal to mid inferior INFARCT - NO ISCHEMIA; 10 METs  . Coronary angioplasty with stent placement  03/07/2011    inferior STEMI - RCA PROMUS 3.5 mm x 28 mm DES  (post Dil 3.7 distal to 4.2 prox)  . Cardiac catheterization  03/08/2011    improved flow from the PCI  . Left heart catheterization with coronary angiogram  N/A 03/07/2011    Procedure: LEFT HEART CATHETERIZATION WITH CORONARY ANGIOGRAM;  Surgeon: Marykay Lex, MD;  Location: [facility] CATH LAB;  Service: Cardiovascular;  Laterality: N/A;  . Left heart catheterization with coronary angiogram N/A 03/08/2011    Procedure: LEFT HEART CATHETERIZATION WITH CORONARY ANGIOGRAM;  Surgeon: Marykay Lex, MD;  Location: [facility] CATH LAB;  Service: Cardiovascular;  Laterality: N/A;  . Knee surgery Left 2015  . Knee surgery Right 2010    Baker cyst  . Dental surgery  2016  . Upper gi endoscopy  02-22-03    Dr. Lemar Livings, normal  . Breast biopsy Right 01/31/1999    fibrocystic changes, ductal adenosis, focal atypical ductal  epithelium.  . Cholecystectomy  03/25/1998    Dr. Lemar Livings, chronic cholecystitis.  . Skin nodule Left 09/11/1999    dermatofibroma left lower leg, positive lateral margin.  Marland Kitchen Appendectomy  07/04/2000    Normal appendix, adhesions noted.  . Colonoscopy with propofol N/A 09/28/2014    Procedure: COLONOSCOPY WITH PROPOFOL;  Surgeon: Earline Mayotte, MD;  Location: [facility] ENDOSCOPY;  Service: Endoscopy;  Laterality: N/A;  . Esophagogastroduodenoscopy N/A 09/28/2014    Procedure: ESOPHAGOGASTRODUODENOSCOPY (EGD);  Surgeon: Earline Mayotte, MD;  Location: [facility] ENDOSCOPY;  Service: Endoscopy;  Laterality: N/A;   Family History:  Family History  Problem Relation Age of Onset  . Heart attack Mother   . Coronary artery disease Mother   . Stroke Mother   . Depression Mother   . Anxiety disorder Mother   . Alzheimer's disease Father   . Stroke Father   . Breast cancer Sister 47  . Depression Brother   . Drug abuse Brother   . Bipolar disorder Sister    Social History:  Social History   Social History  . Marital Status: Married    Spouse Name: N/A  . Number of Children: N/A  . Years of Education: N/A   Social History Main Topics  . Smoking status: Former Smoker -- 0.25 packs/day for 40 years    Types: Cigarettes    Quit date: 03/07/2011  . Smokeless tobacco: Never Used  . Alcohol Use: 0.0 oz/week    0 Standard drinks or equivalent per week     Comment: occasional social  . Drug Use: No  . Sexual Activity: Yes   Other Topics Concern  . None   Social History Narrative   Divorced woman -- Now Remarried to her long-term partner.   Exercises routinely on a daily basis roughly 45 minutes a day walking.  Quit smoking at the time of her MI. Does not drink.   Additional History:   Assessment:   Musculoskeletal: Strength & Muscle Tone: within normal limits Gait & Station: normal Patient leans: N/A  Psychiatric Specialty Exam: HPI  Review of Systems  Psychiatric/Behavioral:  Positive for depression (Also like it is slightly better in terms of rumination of depressive thoughts since Abilify was added). Negative for suicidal ideas, hallucinations, memory loss and substance abuse. The patient is not nervous/anxious and does not have insomnia.     Blood pressure 118/82, pulse 60, temperature 97.6 F (36.4 C), temperature source Tympanic, height 5\' 5"  (1.651 m), weight 159 lb 9.6 oz (72.394 kg), SpO2 97 %.Body mass index is 26.56 kg/(m^2).  General Appearance: Neat and Well Groomed  Eye Contact:  Good  Speech:  Normal Rate  Volume:  Normal  Mood:  Okay  Affect:  slightly brighter than the last appointment  Thought Process:  Linear  Orientation:  Full (Time, Place, and Person)  Thought Content:  Negative  Suicidal Thoughts:  No  Homicidal Thoughts:  No  Memory:  Immediate;   Good Recent;   Good Remote;   Good  Judgement:  Good  Insight:  Good  Psychomotor Activity:  Negative  Concentration:  Good  Recall:  Good  Fund of Knowledge: Negative  Language: Good  Akathisia:  Negative  Handed:  Right unknown   AIMS (if indicated):  Not done today, done 12/07/14  Assets:  Communication Skills Desire for Improvement  ADL's:  Intact  Cognition: WNL  Sleep:  good   Is the patient at risk to self?  No. Has the patient been a risk to self in the past 6 months?  No. Has the patient been a risk to self within the distant past?  No. Is the patient a risk to others?  No. Has the patient been a risk to others in the past 6 months?  No. Has the patient been a risk to others within the distant past?  No.  Current Medications: Current Outpatient Prescriptions  Medication Sig Dispense Refill  . ARIPiprazole (ABILIFY) 5 MG tablet Take 1 tablet (5 mg total) by mouth daily. 30 tablet 1  . benazepril (LOTENSIN) 5 MG tablet Take 1 tablet (5 mg total) by mouth daily. 90 tablet 3  . buPROPion (WELLBUTRIN XL) 300 MG 24 hr tablet Take 300 mg by mouth daily.    . clopidogrel  (PLAVIX) 75 MG tablet     . ezetimibe (ZETIA) 10 MG tablet Take 1 tablet (10 mg total) by mouth daily. 90 tablet 3  . ferrous fumarate (FERRO-SEQUELS) 50 MG CR tablet Take 50 mg by mouth 3 (three) times daily with meals.    . gabapentin (NEURONTIN) 300 MG capsule Take 300 mg by mouth every evening.     . metoprolol (LOPRESSOR) 50 MG tablet Take 1 tablet (50 mg total) by mouth 2 (two) times daily. 180 tablet 3  . nitroGLYCERIN (NITROSTAT) 0.4 MG SL tablet Place 1 tablet (0.4 mg total) under the tongue every 5 (five) minutes as needed for chest pain (up to 3 tabs in 15 mins and then call 911). 25 tablet 1  . omeprazole (PRILOSEC OTC) 20 MG tablet Take 20 mg by mouth daily.      . polyethylene glycol powder (GLYCOLAX/MIRALAX) powder 255 grams one bottle for colonoscopy prep 255 g 0  . senna-docusate (SENOKOT-S) 8.6-50 MG per tablet Take by mouth.    . venlafaxine (EFFEXOR-XR) 150 MG 24 hr capsule Take 150 mg by mouth daily. Brand only    . venlafaxine XR (EFFEXOR-XR) 75 MG 24 hr capsule      No current facility-administered medications for this visit.    Medical Decision Making:  Established Problem, Stable/Improving (1) and Review of Medication Regimen & Side Effects (2)  Treatment Plan Summary:Medication management and Plan   Major depressive disorder, recurrent, moderate-continue Wellbutrin XL 300 mg daily, continue Effexor XR 225 mg daily. Continue Abilify 5 mg in the morning. Patient will contact writer if the daytime sedation continues to be an issue.  Bereavement-patient is involved in grief counseling. Patient will return in 1 month. She's been encouraged colony questions as prior to her next point.   Patient has been receiving 90 day supplies from her mail order pharmacy. She was not clear on how much she has left of her Effexor XR and Wellbutrin XL. She indicates she would assess her supply at home and contacted Clinical research associate  if she should need more.  Patient did have metabolic labs drawn in  [facility].   Wallace Going 01/07/2015, 10:40 AM

## 2015-01-27 ENCOUNTER — Encounter: Payer: Self-pay | Admitting: Psychiatry

## 2015-01-27 ENCOUNTER — Ambulatory Visit (INDEPENDENT_AMBULATORY_CARE_PROVIDER_SITE_OTHER): Payer: 59 | Admitting: Psychiatry

## 2015-01-27 VITALS — BP 122/82 | HR 62 | Temp 97.9°F | Ht 65.0 in | Wt 159.0 lb

## 2015-01-27 DIAGNOSIS — F331 Major depressive disorder, recurrent, moderate: Secondary | ICD-10-CM

## 2015-01-27 DIAGNOSIS — Z634 Disappearance and death of family member: Secondary | ICD-10-CM | POA: Diagnosis not present

## 2015-01-27 MED ORDER — BUPROPION HCL ER (XL) 150 MG PO TB24: 450.0000 mg | ORAL_TABLET | Freq: Every day | ORAL | Status: DC

## 2015-01-27 NOTE — Progress Notes (Signed)
BH MD/PA/NP OP Progress Note  01/27/2015 1:50 PM Jessica Landry  MRN:  [id number]  Subjective:  Patient returns for follow-up of her major depressive disorder, recurrent moderate and bereavement. Patient indicates she is doing fairly well. She did get metabolic labs done on 12/28/2014. Her lipids were all within normal limits. Her glucose was elevated at 141. Patient indicates that she did fast and she denies drinking or eating anything. I indicated that we should recheck these things but I was concerned about any impact of the Abilify. As such we will discontinue the Abilify for now and try to maximize her current medications. She is artery on the maximum dose of Effexor XR and thus she has the option to increase her Wellbutrin XL from 300 mg to forge a 50 mg daily. The case she felt the Abilify helped her not obsess about things that she is worried about his much.  However she did appear to have some objective signs of improved mood. She discussed wanting to take action to address wrinkles on her chin and she had a consultation with a Engineer, petroleum. However she indicated that because the plastic surgeon indicated that the wrinkles were secondary to weight loss she would need a more extensive facelift as opposed to just targeting the wrinkles. Patient case she does not want to have that extensive of her procedure. She also brought in a picture of a shirt that she had made herself.   Chief Complaint:  Chief Complaint    Follow-up; Medication Refill; Medication Problem     Visit Diagnosis:  No diagnosis found.  Past Medical History:  Past Medical History  Diagnosis Date  . ST-segment elevation myocardial infarction (STEMI) of inferior wall (HCC) 03/07/2011    RCA Occlusion --> PCI of mRCA; ECHO 03/2011: EF > 55%, MIld Inferior HK, Mild AoV Sclerosis.  Marland Kitchen CAD S/P percutaneous coronary angioplasty 03/07/2011    a) PCI of mRCA with Promus Element DES 3.5 mm x 28 mm (4.2 -- 3.7 mm); b) Myoview  04/2011: EF 81% (small LV Cavity), fixed basal-mid Inferior Infarct, No Ischemia, 10 METS  . Essential hypertension 03/07/2011  . Hyperlipidemia with target LDL less than 70 2012     Statin Intolerance (tried Lipitor & Crestor)  . History of back surgery   . GERD (gastroesophageal reflux disease)   . Anxiety   . Depression 03/08/2011  . Iron deficiency anemia 03/09/2011  . Insomnia     without significant sleeping disorders  . Neuropathy (HCC)   . Arthritis   . Vocal cord polyp 2005    Past Surgical History  Procedure Laterality Date  . Abdominal hysterectomy    . Back surgery    . Doppler echocardiography  03/27/2011    LV cavity is small,EF =>55%,MILD INFERIOR HYPOKINESIS; Mild Aortic Sclerosis  . Nm myocar perf wall motion  04/20/2011    EF 81% (due to small LVcavity),fixed basal to mid inferior INFARCT - NO ISCHEMIA; 10 METs  . Coronary angioplasty with stent placement  03/07/2011    inferior STEMI - RCA PROMUS 3.5 mm x 28 mm DES  (post Dil 3.7 distal to 4.2 prox)  . Cardiac catheterization  03/08/2011    improved flow from the PCI  . Left heart catheterization with coronary angiogram N/A 03/07/2011    Procedure: LEFT HEART CATHETERIZATION WITH CORONARY ANGIOGRAM;  Surgeon: Marykay Lex, MD;  Location: [facility] CATH LAB;  Service: Cardiovascular;  Laterality: N/A;  . Left heart catheterization with coronary angiogram N/A 03/08/2011  Procedure: LEFT HEART CATHETERIZATION WITH CORONARY ANGIOGRAM;  Surgeon: Marykay Lexavid W Harding, MD;  [facility] LAB;  Service: Cardiovascular;  Laterality: N/A;  . Knee surgery Left 2015  . Knee surgery Right 2010    Baker cyst  . Dental surgery  2016  . Upper gi endoscopy  02-22-03    Dr. Lemar LivingsByrnett, normal  . Breast biopsy Right 01/31/1999    fibrocystic changes, ductal adenosis, focal atypical ductal epithelium.  . Cholecystectomy  03/25/1998    Dr. Lemar LivingsByrnett, chronic cholecystitis.  . Skin nodule Left 09/11/1999    dermatofibroma left lower  leg, positive lateral margin.  Marland Kitchen. Appendectomy  07/04/2000    Normal appendix, adhesions noted.  . Colonoscopy with propofol N/A 09/28/2014    Procedure: COLONOSCOPY WITH PROPOFOL;  Surgeon: Earline MayotteJeffrey W Byrnett, MD;  [facility] ENDOSCOPY;  Service: Endoscopy;  Laterality: N/A;  . Esophagogastroduodenoscopy N/A 09/28/2014    Procedure: ESOPHAGOGASTRODUODENOSCOPY (EGD);  Surgeon: Earline MayotteJeffrey W Byrnett, MD;  [facility];  Service: Endoscopy;  Laterality: N/A;   Family History:  Family History  Problem Relation Age of Onset  . Heart attack Mother   . Coronary artery disease Mother   . Stroke Mother   . Depression Mother   . Anxiety disorder Mother   . Alzheimer's disease Father   . Stroke Father   . Breast cancer Sister [id number]  . Depression Brother   . Drug abuse Brother   . Bipolar disorder Sister    Social History:  Social History   Social History  . Marital Status: Married    Spouse Name: N/A  . Number of Children: N/A  . Years of Education: N/A   Social History Main Topics  . Smoking status: Former Smoker -- 0.25 packs/day for 40 years    Types: Cigarettes    Quit date: 03/07/2011  . Smokeless tobacco: Never Used  . Alcohol Use: No     Comment: occasional social  . Drug Use: No  . Sexual Activity: Yes   Other Topics Concern  . None   Social History Narrative   Divorced woman -- Now Remarried to her long-term partner.   Exercises routinely on a daily basis roughly 45 minutes a day walking.  Quit smoking at the time of her MI. Does not drink.   Additional History:   Assessment:   Musculoskeletal: Strength & Muscle Tone: within normal limits Gait & Station: normal Patient leans: N/A  Psychiatric Specialty Exam: HPI  Review of Systems  Psychiatric/Behavioral: Positive for depression (patient's mood is brighter but she is still  porting some obsessive thoughts related to grief and depression). Negative for suicidal ideas, hallucinations, memory loss and  substance abuse. The patient is not nervous/anxious and does not have insomnia.   All other systems reviewed and are negative.   Blood pressure 122/82, pulse 62, temperature 97.9 F (36.6 C), temperature source Tympanic, height 5\' 5"  (1.651 m), weight 159 lb (72.122 kg), SpO2 99 %.Body mass index is 26.46 kg/(m^2).  General Appearance: Neat and Well Groomed  Eye Contact:  Good  Speech:  Normal Rate  Volume:  Normal  Mood:  Okay  Affect:  Bright  Thought Process:  Linear  Orientation:  Full (Time, Place, and Person)  Thought Content:  Negative  Suicidal Thoughts:  No  Homicidal Thoughts:  No  Memory:  Immediate;   Good Recent;   Good Remote;   Good  Judgement:  Good  Insight:  Good  Psychomotor Activity:  Negative  Concentration:  Good  Recall:  Good  Fund of Knowledge: Negative  Language: Good  Akathisia:  Negative  Handed:  Right unknown   AIMS (if indicated):  Not done today, done 12/07/14  Assets:  Communication Skills Desire for Improvement  ADL's:  Intact  Cognition: WNL  Sleep:  good   Is the patient at risk to self?  No. Has the patient been a risk to self in the past 6 months?  No. Has the patient been a risk to self within the distant past?  No. Is the patient a risk to others?  No. Has the patient been a risk to others in the past 6 months?  No. Has the patient been a risk to others within the distant past?  No.  Current Medications: Current Outpatient Prescriptions  Medication Sig Dispense Refill  . benazepril (LOTENSIN) 5 MG tablet Take 1 tablet (5 mg total) by mouth daily. 90 tablet 3  . buPROPion (WELLBUTRIN XL) 150 MG 24 hr tablet Take 3 tablets (450 mg total) by mouth daily. 270 tablet 0  . clopidogrel (PLAVIX) 75 MG tablet     . ezetimibe (ZETIA) 10 MG tablet Take 1 tablet (10 mg total) by mouth daily. 90 tablet 3  . ferrous fumarate (FERRO-SEQUELS) 50 MG CR tablet Take 50 mg by mouth 3 (three) times daily with meals.    . gabapentin (NEURONTIN) 300  MG capsule Take 300 mg by mouth every evening.     . metoprolol (LOPRESSOR) 50 MG tablet Take 1 tablet (50 mg total) by mouth 2 (two) times daily. 180 tablet 3  . nitroGLYCERIN (NITROSTAT) 0.4 MG SL tablet Place 1 tablet (0.4 mg total) under the tongue every 5 (five) minutes as needed for chest pain (up to 3 tabs in 15 mins and then call 911). 25 tablet 1  . omeprazole (PRILOSEC OTC) 20 MG tablet Take 20 mg by mouth daily.      . polyethylene glycol powder (GLYCOLAX/MIRALAX) powder 255 grams one bottle for colonoscopy prep 255 g 0  . senna-docusate (SENOKOT-S) 8.6-50 MG per tablet Take by mouth.    . venlafaxine (EFFEXOR-XR) 150 MG 24 hr capsule Take 150 mg by mouth daily. Brand only    . venlafaxine XR (EFFEXOR-XR) 75 MG 24 hr capsule      No current facility-administered medications for this visit.    Medical Decision Making:  Established Problem, Stable/Improving (1) and Review of Medication Regimen & Side Effects (2)  Treatment Plan Summary:Medication management and Plan   Major depressive disorder, recurrent, moderate-we will increase her Wellbutrin XL from 300 mg daily to 450 mg daily, continue Effexor XR 225 mg daily. At the next visit we can assess a repeat glucose since we have discontinued the Abilify. Depending on the results of that we may consider restarting Abilify or another agent such as Lamictal. Bereavement-patient is involved in grief counseling. Patient will return in 1 month.   Patient has been receiving 90 day supplies from her mail order pharmacy. She was not clear on how much she has left of her Effexor XR and Wellbutrin XL. She indicates she would assess her supply at home and contacted writer if she should need more.  Patient did have metabolic labs drawn in the [facility].   Wallace Going 01/27/2015, 1:50 PM

## 2015-02-22 ENCOUNTER — Ambulatory Visit: Payer: 59 | Admitting: Psychiatry

## 2015-02-23 ENCOUNTER — Ambulatory Visit (INDEPENDENT_AMBULATORY_CARE_PROVIDER_SITE_OTHER): Payer: 59 | Admitting: Cardiology

## 2015-02-23 ENCOUNTER — Encounter: Payer: Self-pay | Admitting: Cardiology

## 2015-02-23 VITALS — BP 120/87 | HR 61 | Ht 65.0 in | Wt 154.2 lb

## 2015-02-23 DIAGNOSIS — R079 Chest pain, unspecified: Secondary | ICD-10-CM

## 2015-02-23 DIAGNOSIS — I2510 Atherosclerotic heart disease of native coronary artery without angina pectoris: Secondary | ICD-10-CM

## 2015-02-23 DIAGNOSIS — E785 Hyperlipidemia, unspecified: Secondary | ICD-10-CM | POA: Diagnosis not present

## 2015-02-23 DIAGNOSIS — R0602 Shortness of breath: Secondary | ICD-10-CM

## 2015-02-23 DIAGNOSIS — I2119 ST elevation (STEMI) myocardial infarction involving other coronary artery of inferior wall: Secondary | ICD-10-CM

## 2015-02-23 DIAGNOSIS — Z9861 Coronary angioplasty status: Secondary | ICD-10-CM

## 2015-02-23 MED ORDER — METOPROLOL TARTRATE 50 MG PO TABS: 50.0000 mg | ORAL_TABLET | Freq: Two times a day (BID) | ORAL | Status: DC

## 2015-02-23 MED ORDER — ATORVASTATIN CALCIUM 10 MG PO TABS: 10.0000 mg | ORAL_TABLET | Freq: Every day | ORAL | Status: DC

## 2015-02-23 MED ORDER — CLOPIDOGREL BISULFATE 75 MG PO TABS: 75.0000 mg | ORAL_TABLET | Freq: Every day | ORAL | Status: DC

## 2015-02-23 MED ORDER — BENAZEPRIL HCL 5 MG PO TABS: 5.0000 mg | ORAL_TABLET | Freq: Every day | ORAL | Status: DC

## 2015-02-23 NOTE — Patient Instructions (Addendum)
Medication Instructions:  Your physician has recommended you make the following change in your medication:  START lipitor 10mg  every other day    Labwork: Lipid and liver panel in three months. Nothing to eat or drink after midnight the evening before your labs.  Testing/Procedures: none  Follow-Up: Your physician wants you to follow-up in: one year with Dr. Leola BrazilHardingYou will receive a reminder letter in the mail two months in advance. If you don't receive a letter, please call our office to schedule the follow-up appointment.   Any Other Special Instructions Will Be Listed Below (If Applicable). Follow up with Jessica Landry, Thursday, November 17 at 2pm for lipid evaluation.  [facility]   [address] [place] [phone]   If you need a refill on your cardiac medications before your next appointment, please call your pharmacy.

## 2015-02-23 NOTE — Progress Notes (Signed)
PCP: Lorie Phenix, MD  Clinic Note: Chief Complaint  Patient presents with  . other    1 year follow up. Pt. c/o chest pain and shortness of breath. Meds reviewed by the patient's med list.   . Coronary Artery Disease    HPI: Jessica Landry is a 59 y.o. female with a PMH below who presents today for 1 yr f/u of CAD-PCI in setting of Inf STEMI.  Jessica Landry was last seen in Nov 2015.she was doing well.  Recent Hospitalizations: none ? [facility].  Studies Reviewed: none  Interval History: Jessica Landry returns today for her annual followup without any major complaints. She had one episode a couple weeks ago that lasted maybe 10 minutes of some discomfort in her chest that she felt was probably more related to anxiety. Otherwise the main thing is normal in the last several months his internist-her psychiatric care she is to return to get her anxiety and depression in order so that she can try to get back into doing some exercise. She really is not doing any activity whatsoever. She was posterior was started on Zetia but that never started. Initially was supposed to be seen Phillips Hay in our lipid clinic, after seeing her one time distal to the cracks and she then returned  In addition to her depression/anxiety issues her last few months she has really been limited by knee pain. She never really got improvement from the surgery on her knee before, and is thinking that she may need to have redo surgery.  Besides the one episode of chest pain above, she is not in any additional episodes of chest tightness/pressure with rest or exertion. Portable because she's out of shape she is alert dyspneic on exertion but nothing consistent with an MI type symptom. No PND, orthopnea or edema.  She does have occasional palpitations but nothing long lasting. No lightheadedness, dizziness, weakness or syncope/near syncope. No TIA/amaurosis fugax symptoms. No melena, hematochezia, hematuria, or epstaxis. No  claudication.  ROS: A comprehensive was performed. Review of Systems  Constitutional: Positive for malaise/fatigue (Probably from depression).  Respiratory: Negative for cough.   Cardiovascular: Negative for claudication.  Musculoskeletal: Positive for joint pain (both knee). Negative for falls.  Neurological: Positive for dizziness. Negative for headaches.  Psychiatric/Behavioral: Positive for depression. Negative for memory loss. The patient is nervous/anxious.   All other systems reviewed and are negative.   Past Medical History  Diagnosis Date  . ST-segment elevation myocardial infarction (STEMI) of inferior wall (HCC) 03/07/2011    RCA Occlusion --> PCI of mRCA; ECHO 03/2011: EF > 55%, MIld Inferior HK, Mild AoV Sclerosis.  Marland Kitchen CAD S/P percutaneous coronary angioplasty 03/07/2011    a) PCI of mRCA with Promus Element DES 3.5 mm x 28 mm (4.2 -- 3.7 mm); b) Myoview 04/2011: EF 81% (small LV Cavity), fixed basal-mid Inferior Infarct, No Ischemia, 10 METS  . Essential hypertension 03/07/2011  . Hyperlipidemia with target LDL less than 70 2012     Statin Intolerance (tried Lipitor & Crestor)  . History of back surgery   . GERD (gastroesophageal reflux disease)   . Anxiety   . Depression 03/08/2011  . Iron deficiency anemia 03/09/2011  . Insomnia     without significant sleeping disorders  . Neuropathy (HCC)   . Arthritis   . Vocal cord polyp 2005    Past Surgical History  Procedure Laterality Date  . Abdominal hysterectomy    . Back surgery    . Doppler echocardiography  03/27/2011    LV cavity is small,EF =>55%,MILD INFERIOR HYPOKINESIS; Mild Aortic Sclerosis  . Nm myocar perf wall motion  04/20/2011    EF 81% (due to small LVcavity),fixed basal to mid inferior INFARCT - NO ISCHEMIA; 10 METs  . Coronary angioplasty with stent placement  03/07/2011    inferior STEMI - RCA PROMUS 3.5 mm x 28 mm DES  (post Dil 3.7 distal to 4.2 prox)  . Cardiac catheterization  03/08/2011     improved flow from the PCI  . Left heart catheterization with coronary angiogram N/A 03/07/2011    Procedure: LEFT HEART CATHETERIZATION WITH CORONARY ANGIOGRAM;  Surgeon: Marykay Lex, MD;  Location: [facility] CATH LAB;  Service: Cardiovascular;  Laterality: N/A;  . Left heart catheterization with coronary angiogram N/A 03/08/2011    Procedure: LEFT HEART CATHETERIZATION WITH CORONARY ANGIOGRAM;  Surgeon: Marykay Lex, MD;  Location: [facility] CATH LAB;  Service: Cardiovascular;  Laterality: N/A;  . Knee surgery Left 2015  . Knee surgery Right 2010    Baker cyst  . Dental surgery  2016  . Upper gi endoscopy  02-22-03    Dr. Lemar Livings, normal  . Breast biopsy Right 01/31/1999    fibrocystic changes, ductal adenosis, focal atypical ductal epithelium.  . Cholecystectomy  03/25/1998    Dr. Lemar Livings, chronic cholecystitis.  . Skin nodule Left 09/11/1999    dermatofibroma left lower leg, positive lateral margin.  Marland Kitchen Appendectomy  07/04/2000    Normal appendix, adhesions noted.  . Colonoscopy with propofol N/A 09/28/2014    Procedure: COLONOSCOPY WITH PROPOFOL;  Surgeon: Earline Mayotte, MD;  Location: [facility] ENDOSCOPY;  Service: Endoscopy;  Laterality: N/A;  . Esophagogastroduodenoscopy N/A 09/28/2014    Procedure: ESOPHAGOGASTRODUODENOSCOPY (EGD);  Surgeon: Earline Mayotte, MD;  Location: [facility] ENDOSCOPY;  Service: Endoscopy;  Laterality: N/A;   Prior to Admission medications   Medication Sig Start Date End Date Taking? Authorizing Provider  benazepril (LOTENSIN) 5 MG tablet Take 1 tablet (5 mg total) by mouth daily. 03/02/14  Yes Marykay Lex, MD  buPROPion (WELLBUTRIN XL) 300 MG 24 hr tablet Take 300 mg by mouth daily.   Yes Historical Provider, MD  clopidogrel (PLAVIX) 75 MG tablet Take 75 mg by mouth daily.  08/22/14  Yes Historical Provider, MD  gabapentin (NEURONTIN) 600 MG tablet Take 600 mg by mouth at bedtime.   Yes Historical Provider, MD  metoprolol (LOPRESSOR) 50 MG tablet Take 1 tablet  (50 mg total) by mouth 2 (two) times daily. 03/02/14  Yes Marykay Lex, MD  nitroGLYCERIN (NITROSTAT) 0.4 MG SL tablet Place 1 tablet (0.4 mg total) under the tongue every 5 (five) minutes as needed for chest pain (up to 3 tabs in 15 mins and then call 911). 06/16/13  Yes Marykay Lex, MD  omeprazole (PRILOSEC OTC) 20 MG tablet Take 20 mg by mouth daily.     Yes Historical Provider, MD  venlafaxine (EFFEXOR-XR) 150 MG 24 hr capsule Take 150 mg by mouth daily. Brand only   Yes Historical Provider, MD  venlafaxine XR (EFFEXOR-XR) 75 MG 24 hr capsule Take 75 mg by mouth daily with breakfast.  08/11/14  Yes Historical Provider, MD    Allergies  Allergen Reactions  . Bee Venom Anaphylaxis  . Statins Other (See Comments)    Myalgias & fatigue  . Etodolac   . Prednisone Other (See Comments)    HR increase; increased energy   . Sulfa Antibiotics Other (See Comments)    Elevated B/P,  skin turns red    Social History   Social History  . Marital Status: Married    Spouse Name: N/A  . Number of Children: N/A  . Years of Education: N/A   Social History Main Topics  . Smoking status: Former Smoker -- 0.25 packs/day for 40 years    Types: Cigarettes    Quit date: 03/07/2011  . Smokeless tobacco: Never Used  . Alcohol Use: No     Comment: occasional social  . Drug Use: No  . Sexual Activity: Yes   Other Topics Concern  . None   Social History Narrative   Divorced woman -- Now Remarried to her long-term partner.   Exercises routinely on a daily basis roughly 45 minutes a day walking.  Quit smoking at the time of her MI. Does not drink.   Family History  Problem Relation Age of Onset  . Heart attack Mother   . Coronary artery disease Mother   . Stroke Mother   . Depression Mother   . Anxiety disorder Mother   . Alzheimer's disease Father   . Stroke Father   . Breast cancer Sister [id number]  . Depression Brother   . Drug abuse Brother   . Bipolar disorder Sister     Wt Readings  from Last 3 Encounters:  02/25/15 159 lb 1.6 oz (72.167 kg)  02/23/15 154 lb 4 oz (69.967 kg)  01/27/15 159 lb (72.122 kg)    PHYSICAL EXAM BP 120/87 mmHg  Pulse 61  Ht 5\' 5"  (1.651 m)  Wt 154 lb 4 oz (69.967 kg)  BMI 25.67 kg/m2  General appearance: alert, cooperative, appears stated age, no distress and Pleasant mood and affect. Well-nourished and well-groomed. HEENT: [place]/AT, EOMI, MMM, anicteric sclera Neck: no adenopathy, no JVD, supple, symmetrical, trachea midline, thyroid not enlarged, symmetric, no tenderness/mass/nodules and Soft right-sided bruit versus referred aortic sclerosis murmur. Lungs: clear to auscultation bilaterally, normal percussion bilaterally and Nonlabored, good air movement Heart: normal apical impulse, regular rate and rhythm, S1&S2 normal, no S3 or S4; 1/6 early-peaking harsh c-d SEM @ RUSB--> carotids, no click and no rub Abdomen: soft, non-tender; bowel sounds normal; no masses, no organomegaly and No masses or firm fecalith palpated Extremities: extremities normal, atraumatic, no cyanosis or edema, no edema, redness or tenderness in the calves or thighs and no ulcers, gangrene or trophic changes Pulses: 2+ and symmetric Skin: Skin color, texture, turgor normal. No rashes or lesions Neurologic: Grossly normal    Adult ECG Report  Rate: 61 ;  Rhythm: normal sinus rhythm and NS ST-T abnormality.; mild flattening slight depression of ST segments in inferolateral leads  Narrative Interpretation: normal axis and intervals and durations. Otherwise normal EKG. No change from 2050   Other studies Reviewed: Additional studies/ records that were reviewed today include:  Recent Labs:   Lab Results  Component Value Date   CHOL 246* 06/28/2014   HDL 77 06/28/2014   LDLCALC 152* 06/28/2014   TRIG 84 06/28/2014   CHOLHDL 3.2 06/28/2014    Lab Results  Component Value Date   HGBA1C 5.9* 03/08/2011    ASSESSMENT / PLAN: Problem List Items Addressed This  Visit    ST elevation myocardial infarction (STEMI) of inferior wall, subsequent episode of care (HCC) (Chronic)    No current signs or symptoms of heart failure or angina. Well preserved EF by both echo and nuclear stress test. Continues to be on Lasix monotherapy along with stable dose of beta blocker and ACE inhibitor.  We are working on getting her back onto her lipid management regimen. -- Has not been able to take statins in the past.      Relevant Medications   benazepril (LOTENSIN) 5 MG tablet   metoprolol (LOPRESSOR) 50 MG tablet   atorvastatin (LIPITOR) 10 MG tablet   SOB (shortness of breath)    Most likely consistent with deconditioning. She is not having other heart failure symptoms and has preserved EF. She needs to have her knees repaired, so she can go back to exercising      Relevant Orders   EKG 12-Lead (Completed)   Pain in the chest - Primary    One episode in one year or lasting several minutes. Not been recurring with rest or exercise. Unlikely to be cardiac in nature.      Relevant Orders   EKG 12-Lead (Completed)   Hyperlipidemia with target LDL less than 70; Statin Intolerant (Chronic)    Last laboratory [place]. I ordered labs to recheck her lipid level. LDL was clearly not at goal. Never had Zetia refilled. I would like her to take a statin even if it is every other day. We'll try Lipitor 10 mg every other day. Refer back to Phillips Hay, RPH-CCP lipid clinic --> We'll need to assess maximum tolerable statin dose - in order to potentially consider [facility]-9 treatment      Relevant Medications   benazepril (LOTENSIN) 5 MG tablet   metoprolol (LOPRESSOR) 50 MG tablet   atorvastatin (LIPITOR) 10 MG tablet   CAD S/P PCI mRCA: Promus Element DES 3.5 mm x 28 mm (4.2-37 mm)) (Chronic)    Doing well without any active anginal symptoms. On Plavix aspirin. On good dose of beta blocker and ACE inhibitor. Quit smoking.      Relevant Medications   benazepril  (LOTENSIN) 5 MG tablet   metoprolol (LOPRESSOR) 50 MG tablet   atorvastatin (LIPITOR) 10 MG tablet    Other Visit Diagnoses    Hyperlipemia        Relevant Medications    benazepril (LOTENSIN) 5 MG tablet    metoprolol (LOPRESSOR) 50 MG tablet    atorvastatin (LIPITOR) 10 MG tablet    Other Relevant Orders    Hepatic function panel    Lipid Profile       Current medicines are reviewed at length with the patient today. (+/- concerns) never did take Zetia. Stopped statin The following changes have been made: try adding Lipitor 10 mg every other day - Refer to the clinic Studies Ordered:   Orders Placed This Encounter  Procedures  . Hepatic function panel  . Lipid Profile  . EKG 12-Lead      Marykay Lex, M.D., M.S. Interventional Cardiologist   Pager # [phone]

## 2015-02-24 ENCOUNTER — Telehealth: Payer: Self-pay

## 2015-02-24 ENCOUNTER — Ambulatory Visit (INDEPENDENT_AMBULATORY_CARE_PROVIDER_SITE_OTHER): Payer: 59 | Admitting: Pharmacist Clinician (PhC)/ Clinical Pharmacy Specialist

## 2015-02-24 VITALS — Ht 65.0 in | Wt 159.1 lb

## 2015-02-24 DIAGNOSIS — E785 Hyperlipidemia, unspecified: Secondary | ICD-10-CM

## 2015-02-24 NOTE — Telephone Encounter (Signed)
Pharmacist needs clarification on directions for Lipitor Reference # [id number]

## 2015-02-24 NOTE — Patient Instructions (Signed)
Start Zetia 10 mg tablets once daily, after 2 weeks you can start the Lipitor (atorvastatain) 10 mg tablets.  If Lipitor causes muscle pains, please call and let me know.  [phone] [name]([name])  Repeat your cholesterol blood work in early January.  See me about 1 week after labs for a review.    Cholesterol Cholesterol is a fat. Your body needs a small amount of cholesterol. Cholesterol may build up in your blood vessels. This increases your chance of having a heart attack or stroke. You cannot feel your cholesterol levels. The only way to know your cholesterol level is high is with a blood test. Keep your test results. Work with your doctor to keep your cholesterol at a good level. WHAT DO THE TEST RESULTS MEAN?  Total cholesterol is how much cholesterol is in your blood.  LDL is bad cholesterol. This is the type that can build up. You want LDL to be low.  HDL is good cholesterol. It cleans your blood vessels and carries LDL away. You want HDL to be high.  Triglycerides are fat that the body can burn for energy or store. WHAT ARE GOOD LEVELS OF CHOLESTEROL?  Total cholesterol below 200.  LDL below 100 for people at risk. Below 70 for those at very high risk.  HDL above 50 is good. Above 60 is best.  Triglycerides below 150. HOW CAN I LOWER MY CHOLESTEROL?  Diet. Follow your diet programs as told by your doctor.  Choose fish, white meat chicken, roasted [place], or baked [place]. Try not to eat red meat, fried foods, or processed meats such as sausage and lunch meats.  Eat lots of fresh fruits and vegetables.  Choose whole grains, beans, pasta, potatoes, and cereals.  Use only small amounts of olive, corn, or canola oils.  Try not to eat butter, mayonnaise, shortening, or palm kernel oils.  Try not to eat foods with trans fats.  Drink skim or nonfat milk. Eat low-fat or nonfat yogurt and cheeses. Try not to drink whole milk or cream. Try not to eat ice cream, egg yolks, and  full-fat cheeses.  Healthy desserts include angel food cake, ginger snaps, animal crackers, hard candy, popsicles, and low-fat or nonfat frozen yogurt. Try not to eat pastries, cakes, pies, and cookies.  Exercise. Follow your exercise programs as told by your doctor.  Be more active. You can try gardening, walking, or taking the stairs. Ask your doctor about how you can be more active.  Medicine. Take medicine as told by your doctor.   This information is not intended to replace advice given to you by your health care provider. Make sure you discuss any questions you have with your health care provider.   Document Released: 06/22/2008 Document Revised: 04/16/2014 Document Reviewed: 01/07/2013 Elsevier Interactive Patient Education [employer]2016 Elsevier Inc.

## 2015-02-24 NOTE — Telephone Encounter (Signed)
S/w Annice [facility]ie [facility] lipitor prescription. Verified to be taken every other day. Annice PihJackie verbalized understanding

## 2015-02-25 ENCOUNTER — Encounter: Payer: Self-pay | Admitting: Cardiology

## 2015-02-25 ENCOUNTER — Telehealth: Payer: Self-pay | Admitting: Psychiatry

## 2015-02-25 DIAGNOSIS — R079 Chest pain, unspecified: Secondary | ICD-10-CM | POA: Insufficient documentation

## 2015-02-25 DIAGNOSIS — R0602 Shortness of breath: Secondary | ICD-10-CM | POA: Insufficient documentation

## 2015-02-25 MED ORDER — ARIPIPRAZOLE 5 MG PO TABS: 5.0000 mg | ORAL_TABLET | Freq: Every day | ORAL | Status: DC

## 2015-02-25 MED ORDER — EZETIMIBE 10 MG PO TABS: 10.0000 mg | ORAL_TABLET | Freq: Every day | ORAL | Status: DC

## 2015-02-25 NOTE — Assessment & Plan Note (Signed)
Most likely consistent with deconditioning. She is not having other heart failure symptoms and has preserved EF. She needs to have her knees repaired, so she can go back to exercising

## 2015-02-25 NOTE — Telephone Encounter (Signed)
Spoke with patient about her repeated blood sugar labs. She had labs done on 02/23/2015 her hemoglobin A1c was 5.5 and her glucose was 93. As such she is reporting that the Abilify did help her mood and thus were going to restart this now that we see her blood sugar is within normal limits. We will restart her Abilify 5 mg in the morning daily. Patient really has follow-up. I discussed the results and the plan with patient who is in agreement. AW

## 2015-02-25 NOTE — Assessment & Plan Note (Signed)
Pt most recent labs are 8 months old at this time. Dr. Herbie BaltimoreHarding ordered labs for her yesterday.  Since she does not recall problems with the Zetia, other than price, she would like to see if that is more affordable now that available generically.  I advised that she hold off on the atorvastatin for two week, start the Zetia first, then add atorvastatin to avoid problems should a side effect occur.  Also advised that she hold off on repeating lipid labs until first of January, so we can see the impact of these two medications.  I will see her back in January after the blood work, to review any improvements and potentially set her up for PCSK-9 treatment.

## 2015-02-25 NOTE — Assessment & Plan Note (Signed)
No current signs or symptoms of heart failure or angina. Well preserved EF by both echo and nuclear stress test. Continues to be on Lasix monotherapy along with stable dose of beta blocker and ACE inhibitor. We are working on getting her back onto her lipid management regimen. -- Has not been able to take statins in the past.

## 2015-02-25 NOTE — Assessment & Plan Note (Signed)
One episode in one year or lasting several minutes. Not been recurring with rest or exercise. Unlikely to be cardiac in nature.

## 2015-02-25 NOTE — Progress Notes (Signed)
02/25/2015 Jessica Landry [birth year] [id number]   HPI:  Jessica Landry is a 59 y.o. female patient of Dr Herbie Baltimore, who presents today for a lipid clinic evaluation.  She actually saw Dr. Herbie Baltimore yesterday in our [facility] office and he started her on atorvastatin 10 mg.  She has not yet picked up that prescription.    RF: STEMI 02/2011, CAD with DES, hypertension    Meds: none currently    Intolerant: failed rosuvastatin 5 mg twice weekly in 2012 after MI; also previously tried Zetia, but not covered under her insurance plan  Family history: mother had first MI in her 46s, died recently at age 60; sisters all without any noted disease  Diet: does admit to high carb diet, lots of pasta, potatoes; eats chicken, broccoil and salad regularly as well; does not drink alcohol, or use tobacco  Exercise: unable due to knee problems, is hoping to have double TKA soon    Labs:  06/2014:  TC 246, TG 84, HDL 77, LDL 152 (no meds)   Current Outpatient Prescriptions  Medication Sig Dispense Refill  . atorvastatin (LIPITOR) 10 MG tablet Take 1 tablet (10 mg total) by mouth daily. Take one tablet every other day 90 tablet 3  . benazepril (LOTENSIN) 5 MG tablet Take 1 tablet (5 mg total) by mouth daily. 90 tablet 3  . buPROPion (WELLBUTRIN XL) 300 MG 24 hr tablet Take 300 mg by mouth daily.    . clopidogrel (PLAVIX) 75 MG tablet Take 1 tablet (75 mg total) by mouth daily. 90 tablet 3  . gabapentin (NEURONTIN) 600 MG tablet Take 600 mg by mouth at bedtime.    . metoprolol (LOPRESSOR) 50 MG tablet Take 1 tablet (50 mg total) by mouth 2 (two) times daily. 180 tablet 3  . nitroGLYCERIN (NITROSTAT) 0.4 MG SL tablet Place 1 tablet (0.4 mg total) under the tongue every 5 (five) minutes as needed for chest pain (up to 3 tabs in 15 mins and then call 911). 25 tablet 1  . omeprazole (PRILOSEC OTC) 20 MG tablet Take 20 mg by mouth daily.      Marland Kitchen venlafaxine (EFFEXOR-XR) 150 MG 24 hr capsule Take 150 mg by mouth  daily. Brand only    . venlafaxine XR (EFFEXOR-XR) 75 MG 24 hr capsule Take 75 mg by mouth daily with breakfast.      No current facility-administered medications for this visit.    Allergies  Allergen Reactions  . Bee Venom Anaphylaxis  . Statins Other (See Comments)    Myalgias & fatigue  . Etodolac   . Prednisone Other (See Comments)    HR increase; increased energy   . Sulfa Antibiotics Other (See Comments)    Elevated B/P, skin turns red    Past Medical History  Diagnosis Date  . ST-segment elevation myocardial infarction (STEMI) of inferior wall (HCC) 03/07/2011    RCA Occlusion --> PCI of mRCA; ECHO 03/2011: EF > 55%, MIld Inferior HK, Mild AoV Sclerosis.  Marland Kitchen CAD S/P percutaneous coronary angioplasty 03/07/2011    a) PCI of mRCA with Promus Element DES 3.5 mm x 28 mm (4.2 -- 3.7 mm); b) Myoview 04/2011: EF 81% (small LV Cavity), fixed basal-mid Inferior Infarct, No Ischemia, 10 METS  . Essential hypertension 03/07/2011  . Hyperlipidemia with target LDL less than 70 2012     Statin Intolerance (tried Lipitor & Crestor)  . History of back surgery   . GERD (gastroesophageal reflux disease)   . Anxiety   .  Depression 03/08/2011  . Iron deficiency anemia 03/09/2011  . Insomnia     without significant sleeping disorders  . Neuropathy (HCC)   . Arthritis   . Vocal cord polyp 2005    Height 5\' 5"  (1.651 m), weight 159 lb 1.6 oz (72.167 kg).    Phillips HayKristin Carola Viramontes PharmD CPP [facility]

## 2015-02-25 NOTE — Assessment & Plan Note (Signed)
Doing well without any active anginal symptoms. On Plavix aspirin. On good dose of beta blocker and ACE inhibitor. Quit smoking.

## 2015-02-25 NOTE — Assessment & Plan Note (Signed)
Last laboratory [place]. I ordered labs to recheck her lipid level. LDL was clearly not at goal. Never had Zetia refilled. I would like her to take a statin even if it is every other day. We'll try Lipitor 10 mg every other day. Refer back to Phillips HayKristin Alvstad, RPH-CCP lipid clinic --> We'll need to assess maximum tolerable statin dose - in order to potentially consider PCSK-9 treatment

## 2015-03-01 ENCOUNTER — Encounter: Payer: Self-pay | Admitting: Psychiatry

## 2015-03-01 ENCOUNTER — Ambulatory Visit (INDEPENDENT_AMBULATORY_CARE_PROVIDER_SITE_OTHER): Payer: 59 | Admitting: Psychiatry

## 2015-03-01 VITALS — BP 122/80 | HR 67 | Temp 97.2°F | Ht 65.0 in | Wt 161.2 lb

## 2015-03-01 DIAGNOSIS — F331 Major depressive disorder, recurrent, moderate: Secondary | ICD-10-CM | POA: Diagnosis not present

## 2015-03-01 NOTE — Progress Notes (Signed)
BH MD/PA/NP OP Progress Note  03/01/2015 10:49 AM Jessica Landry  MRN:  [id number]  Subjective:  Patient returns for follow-up of her major depressive disorder, recurrent moderate and bereavement. We had repeated her metabolic labs and thus restarted her back on Abilify last week. Patient states she is now been on the Abilify for about 4 days. As in the past she states that she's noticed that helps her not obsess on negative thoughts as much. She states that she still feels somewhat depressed. She states she simply not as outgoing and funny she used to be. She states her husband notices this. I discussed with her that her options at this time would be to begin to switch out her antidepressants which are largely at maximum doses. We had tried to increase her Wellbutrin to 450 mg daily she was not able to tolerate that. Alternatively we discussed we could wait to see about her response to Abilify as she had reported that she felt it was helpful. I did discuss with her that we might need to taper her off of the Effexor and this can be a challenging process and that she is elected to not adjust her antidepressants at this time into continue on the Abilify.    Chief Complaint:  Chief Complaint    Follow-up; Medication Refill     Visit Diagnosis:     ICD-9-CM ICD-10-CM   1. Major depressive disorder, recurrent episode, moderate (HCC) 296.32 F33.1     Past Medical History:  Past Medical History  Diagnosis Date  . ST-segment elevation myocardial infarction (STEMI) of inferior wall (HCC) 03/07/2011    RCA Occlusion --> PCI of mRCA; ECHO 03/2011: EF > 55%, MIld Inferior HK, Mild AoV Sclerosis.  Marland Kitchen CAD S/P percutaneous coronary angioplasty 03/07/2011    a) PCI of mRCA with Promus Element DES 3.5 mm x 28 mm (4.2 -- 3.7 mm); b) Myoview 04/2011: EF 81% (small LV Cavity), fixed basal-mid Inferior Infarct, No Ischemia, 10 METS  . Essential hypertension 03/07/2011  . Hyperlipidemia with target LDL less than  70 2012     Statin Intolerance (tried Lipitor & Crestor)  . History of back surgery   . GERD (gastroesophageal reflux disease)   . Anxiety   . Depression 03/08/2011  . Iron deficiency anemia 03/09/2011  . Insomnia     without significant sleeping disorders  . Neuropathy (HCC)   . Arthritis   . Vocal cord polyp 2005    Past Surgical History  Procedure Laterality Date  . Abdominal hysterectomy    . Back surgery    . Doppler echocardiography  03/27/2011    LV cavity is small,EF =>55%,MILD INFERIOR HYPOKINESIS; Mild Aortic Sclerosis  . Nm myocar perf wall motion  04/20/2011    EF 81% (due to small LVcavity),fixed basal to mid inferior INFARCT - NO ISCHEMIA; 10 METs  . Coronary angioplasty with stent placement  03/07/2011    inferior STEMI - RCA PROMUS 3.5 mm x 28 mm DES  (post Dil 3.7 distal to 4.2 prox)  . Cardiac catheterization  03/08/2011    improved flow from the PCI  . Left heart catheterization with coronary angiogram N/A 03/07/2011    Procedure: LEFT HEART CATHETERIZATION WITH CORONARY ANGIOGRAM;  Surgeon: Marykay Lex, MD;  Location: [facility] CATH LAB;  Service: Cardiovascular;  Laterality: N/A;  . Left heart catheterization with coronary angiogram N/A 03/08/2011    Procedure: LEFT HEART CATHETERIZATION WITH CORONARY ANGIOGRAM;  Surgeon: Marykay Lex, MD;  Location[facility]  CATH LAB;  Service: Cardiovascular;  Laterality: N/A;  . Knee surgery Left 2015  . Knee surgery Right 2010    Baker cyst  . Dental surgery  2016  . Upper gi endoscopy  02-22-03    Dr. Lemar Livings, normal  . Breast biopsy Right 01/31/1999    fibrocystic changes, ductal adenosis, focal atypical ductal epithelium.  . Cholecystectomy  03/25/1998    Dr. Lemar Livings, chronic cholecystitis.  . Skin nodule Left 09/11/1999    dermatofibroma left lower leg, positive lateral margin.  Marland Kitchen Appendectomy  07/04/2000    Normal appendix, adhesions noted.  . Colonoscopy with propofol N/A 09/28/2014    Procedure: COLONOSCOPY WITH  PROPOFOL;  Surgeon: Earline Mayotte, MD;  Location: [facility] ENDOSCOPY;  Service: Endoscopy;  Laterality: N/A;  . Esophagogastroduodenoscopy N/A 09/28/2014    Procedure: ESOPHAGOGASTRODUODENOSCOPY (EGD);  Surgeon: Earline Mayotte, MD;  Location: [facility] ENDOSCOPY;  Service: Endoscopy;  Laterality: N/A;   Family History:  Family History  Problem Relation Age of Onset  . Heart attack Mother   . Coronary artery disease Mother   . Stroke Mother   . Depression Mother   . Anxiety disorder Mother   . Alzheimer's disease Father   . Stroke Father   . Breast cancer Sister 6  . Depression Brother   . Drug abuse Brother   . Bipolar disorder Sister    Social History:  Social History   Social History  . Marital Status: Married    Spouse Name: N/A  . Number of Children: N/A  . Years of Education: N/A   Social History Main Topics  . Smoking status: Former Smoker -- 0.25 packs/day for 40 years    Types: Cigarettes    Quit date: 03/07/2011  . Smokeless tobacco: Never Used  . Alcohol Use: No     Comment: occasional social  . Drug Use: No  . Sexual Activity: Yes   Other Topics Concern  . None   Social History Narrative   Divorced woman -- Now Remarried to her long-term partner.   Exercises routinely on a daily basis roughly 45 minutes a day walking.  Quit smoking at the time of her MI. Does not drink.   Additional History:   Assessment:   Musculoskeletal: Strength & Muscle Tone: within normal limits Gait & Station: normal Patient leans: N/A  Psychiatric Specialty Exam: HPI  Review of Systems  Psychiatric/Behavioral: Positive for depression (patient's mood is brighter but she is still  porting some obsessive thoughts related to grief and depression). Negative for suicidal ideas, hallucinations, memory loss and substance abuse. The patient is not nervous/anxious and does not have insomnia.   All other systems reviewed and are negative.   Blood pressure 122/80, pulse 67, temperature  97.2 F (36.2 C), temperature source Tympanic, height  (1.651 m), weight 161 lb 3.2 oz (73.12 kg), SpO2 98 %.Body mass index is 26.83 kg/(m^2).  General Appearance: Neat and Well Groomed  Eye Contact:  Good  Speech:  Normal Rate  Volume:  Normal  Mood:  Okay  Affect:  Constricted  Thought Process:  Linear  Orientation:  Full (Time, Place, and Person)  Thought Content:  Negative  Suicidal Thoughts:  No  Homicidal Thoughts:  No  Memory:  Immediate;   Good Recent;   Good Remote;   Good  Judgement:  Good  Insight:  Good  Psychomotor Activity:  Negative  Concentration:  Good  Recall:  Good  Fund of Knowledge: Negative  Language: Good  Akathisia:  Negative  Handed:  Right unknown   AIMS (if indicated):  Not done today, done 12/07/14  Assets:  Communication Skills Desire for Improvement  ADL's:  Intact  Cognition: WNL  Sleep:  good   Is the patient at risk to self?  No. Has the patient been a risk to self in the past 6 months?  No. Has the patient been a risk to self within the distant past?  No. Is the patient a risk to others?  No. Has the patient been a risk to others in the past 6 months?  No. Has the patient been a risk to others within the distant past?  No.  Current Medications: Current Outpatient Prescriptions  Medication Sig Dispense Refill  . ARIPiprazole (ABILIFY) 5 MG tablet Take 1 tablet (5 mg total) by mouth daily. 30 tablet 1  . atorvastatin (LIPITOR) 10 MG tablet Take 1 tablet (10 mg total) by mouth daily. Take one tablet every other day 90 tablet 3  . benazepril (LOTENSIN) 5 MG tablet Take 1 tablet (5 mg total) by mouth daily. 90 tablet 3  . buPROPion (WELLBUTRIN XL) 300 MG 24 hr tablet Take 300 mg by mouth daily.    . clopidogrel (PLAVIX) 75 MG tablet Take 1 tablet (75 mg total) by mouth daily. 90 tablet 3  . ezetimibe (ZETIA) 10 MG tablet Take 1 tablet (10 mg total) by mouth daily. 30 tablet 5  . gabapentin (NEURONTIN) 300 MG capsule     . metoprolol  (LOPRESSOR) 50 MG tablet Take 1 tablet (50 mg total) by mouth 2 (two) times daily. 180 tablet 3  . nitroGLYCERIN (NITROSTAT) 0.4 MG SL tablet Place 1 tablet (0.4 mg total) under the tongue every 5 (five) minutes as needed for chest pain (up to 3 tabs in 15 mins and then call 911). 25 tablet 1  . omeprazole (PRILOSEC OTC) 20 MG tablet Take 20 mg by mouth daily.      Marland Kitchen. venlafaxine (EFFEXOR-XR) 150 MG 24 hr capsule Take 150 mg by mouth daily. Brand only    . venlafaxine XR (EFFEXOR-XR) 75 MG 24 hr capsule Take 75 mg by mouth daily with breakfast.      No current facility-administered medications for this visit.    Medical Decision Making:  Established Problem, Stable/Improving (1) and Review of Medication Regimen & Side Effects (2)  Treatment Plan Summary:Medication management and Plan   Major depressive disorder, recurrent, moderate-we will  continue Effexor XR 225 mg daily and Wellbutrin XL 300 mg daily. Continue her Abilify 5 mg daily for augmentation. We repeated her metabolic labs which were within normal limits on 02/23/2015 of  hemoglobin A1c was 5.5 and her glucose was 93. We will assess her response to the Abilify for additional time and thus at the next visit make a decision about changing her antidepressant regimen.  Bereavement-patient is involved in grief counseling.   Patient will return in 1 month.    Patient did have [facility] in the [facility].   Wallace Goinglton Dariah Mcsorley 03/01/2015, 10:49 AM

## 2015-03-14 ENCOUNTER — Other Ambulatory Visit: Payer: Self-pay | Admitting: Family Medicine

## 2015-03-14 DIAGNOSIS — M9979 Connective tissue and disc stenosis of intervertebral foramina of abdomen and other regions: Secondary | ICD-10-CM

## 2015-03-15 DIAGNOSIS — G4700 Insomnia, unspecified: Secondary | ICD-10-CM | POA: Insufficient documentation

## 2015-03-15 NOTE — Telephone Encounter (Signed)
Last OV 08/2014  Thanks,   -Jessica Landry  

## 2015-03-16 ENCOUNTER — Encounter: Payer: Self-pay | Admitting: Psychiatry

## 2015-03-30 ENCOUNTER — Encounter: Payer: Self-pay | Admitting: Psychiatry

## 2015-03-30 ENCOUNTER — Ambulatory Visit (INDEPENDENT_AMBULATORY_CARE_PROVIDER_SITE_OTHER): Payer: 59 | Admitting: Psychiatry

## 2015-03-30 VITALS — BP 128/78 | HR 70 | Temp 97.2°F | Ht 65.0 in | Wt 162.6 lb

## 2015-03-30 DIAGNOSIS — K5900 Constipation, unspecified: Secondary | ICD-10-CM | POA: Insufficient documentation

## 2015-03-30 DIAGNOSIS — R599 Enlarged lymph nodes, unspecified: Secondary | ICD-10-CM | POA: Insufficient documentation

## 2015-03-30 DIAGNOSIS — Z87891 Personal history of nicotine dependence: Secondary | ICD-10-CM | POA: Insufficient documentation

## 2015-03-30 DIAGNOSIS — I2510 Atherosclerotic heart disease of native coronary artery without angina pectoris: Secondary | ICD-10-CM | POA: Insufficient documentation

## 2015-03-30 DIAGNOSIS — F331 Major depressive disorder, recurrent, moderate: Secondary | ICD-10-CM

## 2015-03-30 DIAGNOSIS — D649 Anemia, unspecified: Secondary | ICD-10-CM | POA: Insufficient documentation

## 2015-03-30 DIAGNOSIS — N951 Menopausal and female climacteric states: Secondary | ICD-10-CM | POA: Insufficient documentation

## 2015-03-30 DIAGNOSIS — Z634 Disappearance and death of family member: Secondary | ICD-10-CM

## 2015-03-30 DIAGNOSIS — L03039 Cellulitis of unspecified toe: Secondary | ICD-10-CM | POA: Insufficient documentation

## 2015-03-30 DIAGNOSIS — R591 Generalized enlarged lymph nodes: Secondary | ICD-10-CM | POA: Insufficient documentation

## 2015-03-30 DIAGNOSIS — R413 Other amnesia: Secondary | ICD-10-CM | POA: Insufficient documentation

## 2015-03-30 DIAGNOSIS — J309 Allergic rhinitis, unspecified: Secondary | ICD-10-CM | POA: Insufficient documentation

## 2015-03-30 DIAGNOSIS — G43909 Migraine, unspecified, not intractable, without status migrainosus: Secondary | ICD-10-CM | POA: Insufficient documentation

## 2015-03-30 MED ORDER — BREXPIPRAZOLE 1 MG PO TABS: 1.0000 mg | ORAL_TABLET | ORAL | Status: DC

## 2015-03-30 NOTE — Progress Notes (Signed)
BH MD/PA/NP OP Progress Note  03/30/2015 11:23 AM Jessica Landry  MRN:  [id number]  Subjective:  Patient returns for follow-up of her major depressive disorder, recurrent moderate and bereavement. She presents to the appointment with her husband. It she indicates she is largely been the same. She states that she does not feel like herself in terms of being happy. Her husband also concurs with this. We had given her a trial of augmentation with Abilify with her Effexor XR and Wellbutrin. I spent time discussing with both of them the options at this point would be to change one of her antidepressants entirely or perhaps try to augment again with say Rexulti. Given possible withdrawal from Effexor in the fact that she has found benefit from it in the past and might make sense to try an augmentation strategy again prior to discontinuing Effexor. Patient indicated she's been seeing her therapist to refer to patient as "flat."  She did indicate she has several stressful things going on in her life such as she has an upcoming knee surgery and has just experienced a one year anniversary of her mother's death.  Patient did come in wearing a blouse/jacket that she pointed out she made. She states sewing has been a hobby of hers.   Chief Complaint:  Chief Complaint    Follow-up; Medication Refill; Depression     Visit Diagnosis:     ICD-9-CM ICD-10-CM   1. Major depressive disorder, recurrent episode, moderate (HCC) 296.32 F33.1   2. Bereavement V62.82 Z63.4     Past Medical History:  Past Medical History  Diagnosis Date  . ST-segment elevation myocardial infarction (STEMI) of inferior wall (HCC) 03/07/2011    RCA Occlusion --> PCI of mRCA; ECHO 03/2011: EF > 55%, MIld Inferior HK, Mild AoV Sclerosis.  Marland Kitchen CAD S/P percutaneous coronary angioplasty 03/07/2011    a) PCI of mRCA with Promus Element DES 3.5 mm x 28 mm (4.2 -- 3.7 mm); b) Myoview 04/2011: EF 81% (small LV Cavity), fixed basal-mid  Inferior Infarct, No Ischemia, 10 METS  . Essential hypertension 03/07/2011  . Hyperlipidemia with target LDL less than 70 2012     Statin Intolerance (tried Lipitor & Crestor)  . History of back surgery   . GERD (gastroesophageal reflux disease)   . Anxiety   . Depression 03/08/2011  . Iron deficiency anemia 03/09/2011  . Insomnia     without significant sleeping disorders  . Neuropathy (HCC)   . Arthritis   . Vocal cord polyp 2005    Past Surgical History  Procedure Laterality Date  . Abdominal hysterectomy    . Back surgery    . Doppler echocardiography  03/27/2011    LV cavity is small,EF =>55%,MILD INFERIOR HYPOKINESIS; Mild Aortic Sclerosis  . Nm myocar perf wall motion  04/20/2011    EF 81% (due to small LVcavity),fixed basal to mid inferior INFARCT - NO ISCHEMIA; 10 METs  . Coronary angioplasty with stent placement  03/07/2011    inferior STEMI - RCA PROMUS 3.5 mm x 28 mm DES  (post Dil 3.7 distal to 4.2 prox)  . Cardiac catheterization  03/08/2011    improved flow from the PCI  . Left heart catheterization with coronary angiogram N/A 03/07/2011    Procedure: LEFT HEART CATHETERIZATION WITH CORONARY ANGIOGRAM;  Surgeon: Marykay Lex, MD;  Location: [facility] CATH LAB;  Service: Cardiovascular;  Laterality: N/A;  . Left heart catheterization with coronary angiogram N/A 03/08/2011    Procedure: LEFT HEART CATHETERIZATION  WITH CORONARY ANGIOGRAM;  Surgeon: Marykay Lexavid W Harding, MD;  [facility];  Service: Cardiovascular;  Laterality: N/A;  . Knee surgery Left 2015  . Knee surgery Right 2010    Baker cyst  . Dental surgery  2016  . Upper gi endoscopy  02-22-03    Dr. Lemar LivingsByrnett, normal  . Breast biopsy Right 01/31/1999    fibrocystic changes, ductal adenosis, focal atypical ductal epithelium.  . Cholecystectomy  03/25/1998    Dr. Lemar LivingsByrnett, chronic cholecystitis.  . Skin nodule Left 09/11/1999    dermatofibroma left lower leg, positive lateral margin.  Marland Kitchen. Appendectomy   07/04/2000    Normal appendix, adhesions noted.  . Colonoscopy with propofol N/A 09/28/2014    Procedure: COLONOSCOPY WITH PROPOFOL;  Surgeon: Earline MayotteJeffrey W Byrnett, MD;  [facility];  Service: Endoscopy;  Laterality: N/A;  . Esophagogastroduodenoscopy N/A 09/28/2014    Procedure: ESOPHAGOGASTRODUODENOSCOPY (EGD);  Surgeon: Earline MayotteJeffrey W Byrnett, MD;  [facility];  Service: Endoscopy;  Laterality: N/A;   Family History:  Family History  Problem Relation Age of Onset  . Heart attack Mother   . Coronary artery disease Mother   . Stroke Mother   . Depression Mother   . Anxiety disorder Mother   . Alzheimer's disease Father   . Stroke Father   . Breast cancer Sister [id number]  . Depression Brother   . Drug abuse Brother   . Bipolar disorder Sister    Social History:  Social History   Social History  . Marital Status: Married    Spouse Name: N/A  . Number of Children: N/A  . Years of Education: N/A   Social History Main Topics  . Smoking status: Former Smoker -- 0.25 packs/day for 40 years    Types: Cigarettes    Quit date: 03/07/2011  . Smokeless tobacco: Never Used  . Alcohol Use: No     Comment: occasional social  . Drug Use: No  . Sexual Activity: Yes   Other Topics Concern  . None   Social History Narrative   Divorced woman -- Now Remarried to her long-term partner.   Exercises routinely on a daily basis roughly 45 minutes a day walking.  Quit smoking at the time of her MI. Does not drink.   Additional History:   Assessment:   Musculoskeletal: Strength & Muscle Tone: within normal limits Gait & Station: normal Patient leans: N/A  Psychiatric Specialty Exam: Depression        Associated symptoms include does not have insomnia and no suicidal ideas.   Review of Systems  Psychiatric/Behavioral: Positive for depression. Negative for suicidal ideas, hallucinations, memory loss and substance abuse. The patient is not nervous/anxious and does not have  insomnia.   All other systems reviewed and are negative.   Blood pressure 128/78, pulse 70, temperature 97.2 F (36.2 C), temperature source Tympanic, height 5\' 5"  (1.651 m), weight 162 lb 9.6 oz (73.755 kg), SpO2 98 %.Body mass index is 27.06 kg/(m^2).  General Appearance: Neat and Well Groomed  Eye Contact:  Good  Speech:  Normal Rate  Volume:  Normal  Mood:  Okay  Affect:  Constricted but able to small laugh when her husband provided supportive comments   Thought Process:  Linear  Orientation:  Full (Time, Place, and Person)  Thought Content:  Negative  Suicidal Thoughts:  No  Homicidal Thoughts:  No  Memory:  Immediate;   Good Recent;   Good Remote;   Good  Judgement:  Good  Insight:  Good  Psychomotor Activity:  Negative  Concentration:  Good  Recall:  Good  Fund of Knowledge: Negative  Language: Good  Akathisia:  Negative  Handed:  Right unknown   AIMS (if indicated):  Not done today, done 12/07/14  Assets:  Communication Skills Desire for Improvement  ADL's:  Intact  Cognition: WNL  Sleep:  good   Is the patient at risk to self?  No. Has the patient been a risk to self in the past 6 months?  No. Has the patient been a risk to self within the distant past?  No. Is the patient a risk to others?  No. Has the patient been a risk to others in the past 6 months?  No. Has the patient been a risk to others within the distant past?  No.  Current Medications: Current Outpatient Prescriptions  Medication Sig Dispense Refill  . benazepril (LOTENSIN) 5 MG tablet Take 1 tablet (5 mg total) by mouth daily. 90 tablet 3  . buPROPion (WELLBUTRIN XL) 300 MG 24 hr tablet Take 300 mg by mouth daily.    . clopidogrel (PLAVIX) 75 MG tablet Take 1 tablet (75 mg total) by mouth daily. 90 tablet 3  . gabapentin (NEURONTIN) 300 MG capsule TAKE 2 CAPSULES EVERY NIGHT 180 capsule 2  . metoprolol (LOPRESSOR) 50 MG tablet Take 1 tablet (50 mg total) by mouth 2 (two) times daily. 180 tablet 3   . nitroGLYCERIN (NITROSTAT) 0.4 MG SL tablet Place 1 tablet (0.4 mg total) under the tongue every 5 (five) minutes as needed for chest pain (up to 3 tabs in 15 mins and then call 911). 25 tablet 1  . omeprazole (PRILOSEC OTC) 20 MG tablet Take 20 mg by mouth daily.      Marland Kitchen venlafaxine (EFFEXOR-XR) 150 MG 24 hr capsule Take 150 mg by mouth daily. Brand only    . venlafaxine XR (EFFEXOR-XR) 75 MG 24 hr capsule Take 75 mg by mouth daily with breakfast.     . Brexpiprazole (REXULTI) 1 MG TABS Take 1 mg by mouth every morning. 30 tablet 1   No current facility-administered medications for this visit.    Medical Decision Making:  Established Problem, Stable/Improving (1) and Review of Medication Regimen & Side Effects (2)  Treatment Plan Summary:Medication management and Plan   Major depressive disorder, recurrent, moderate-we will  continue Effexor XR 225 mg daily and Wellbutrin XL 300 mg daily. We are going to discontinue the Abilify. We will start some exalting 0.5 mg in the morning for 7 days and then she'll go to 1 mg in the morning. Risk and benefits have been discussed in patient's able to consent.  Bereavement-patient is involved in grief counseling.  Patient will return in 1 month. I have discussed my departure from the practice in February but made it aware she will be able to transition to another provider within [facility].  He indicates she is going to have a set of fasting labs as part of her preop in that she would try to have those labs sent to writer. However we do already have metabolic labs for her.     Wallace Going 03/30/2015, 11:23 AM

## 2015-03-30 NOTE — Patient Instructions (Signed)
TAKE REXULTI 0.5 mg in the morning for seven days then increase to 1 mg in the morning.

## 2015-04-05 ENCOUNTER — Ambulatory Visit: Payer: Self-pay | Admitting: Psychiatry

## 2015-04-06 NOTE — Progress Notes (Signed)
pharmacy notified.

## 2015-04-13 ENCOUNTER — Telehealth: Payer: Self-pay

## 2015-04-13 NOTE — Telephone Encounter (Signed)
Spoke with patient today and we have decided that she will discontinue the rexulti. Patient reports it made her cry more. She states she did not notice as much a problem and she was taking 0.5 mg but otherwise it became worse when she went to the 1 mg dose. I discussed with her that her option at this time would be to taper off the Effexor so we could start another antidepressant in conjunction with her Wellbutrin. Patient did raise the issue that this change would be occurring a close of the time that writer is departing and she did not display want to be in the midst of a transition of her medications. She states she would prefer to just continue on her Wellbutrin and Effexor and perhaps make a change when she sees the new psychiatrist in this clinic. AW

## 2015-04-13 NOTE — Telephone Encounter (Signed)
pt called states that the rexulti is not working for her, she "crying all the time, everything seens ugly and bad,"  pt states that there was a couple of night where she could not sleep and was up all night,  Pt states "I just dont feel right on this medication."

## 2015-04-13 NOTE — Telephone Encounter (Signed)
Faxed cardiac clearance to [facility], [phone], attn: Roanna RaiderSherri

## 2015-04-14 ENCOUNTER — Encounter: Payer: Self-pay | Admitting: Pharmacist Clinician (PhC)/ Clinical Pharmacy Specialist

## 2015-04-14 ENCOUNTER — Ambulatory Visit (INDEPENDENT_AMBULATORY_CARE_PROVIDER_SITE_OTHER): Payer: 59 | Admitting: Pharmacist Clinician (PhC)/ Clinical Pharmacy Specialist

## 2015-04-14 VITALS — Ht 65.0 in | Wt 166.0 lb

## 2015-04-14 DIAGNOSIS — E785 Hyperlipidemia, unspecified: Secondary | ICD-10-CM

## 2015-04-14 NOTE — Assessment & Plan Note (Addendum)
Pt with inability to tolerate statins and LDL at 188.  We discussed her options of Praluent [facility]([employer] [facility])) or joining a study thru the [employer].  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  Will submit paperwork to [facility] for approval.

## 2015-04-14 NOTE — Progress Notes (Signed)
04/14/2015 Jessica BrookingSarah A Landry 01/03/1956 [id number]   HPI:  Jessica Landry is a [age] y.o. female patient of Dr Herbie BaltimoreHarding, who presents today for a lipid clinic follow up.  She has tried and failed multiple statins, mostly in the years prior to her MI in 2012.  We do not have any records of doses, but she states having tried Lipitor and Zocor.  These caused myalgias to the point where she had difficulty walking.  Since her MI she tried rosuvastatin 5 mg twice weekly with similar results.     RF: STEMI 02/2011, CAD with DES, hypertension    Meds: none currently    Intolerant: failed rosuvastatin 5 mg twice weekly in 2012 after MI; also previously tried Zetia, but not covered under her insurance plan and could not afford  Family history: mother had first MI in her 2750s, died recently at age 60; sisters all without any noted disease  Diet: does admit to high carb diet, lots of pasta, potatoes; eats chicken, broccoil and salad regularly as well; does not drink alcohol, or use tobacco  Exercise: unable due to knee problems, is having double TKA on March 1 by [facility]    Labs:  04/2014  -  TC 313, TG 145, HDL 96, LDL 188  (no meds) 06/2014:  TC 246, TG 84, HDL 77, LDL 152 (no meds)   Current Outpatient Prescriptions  Medication Sig Dispense Refill  . benazepril (LOTENSIN) 5 MG tablet Take 1 tablet (5 mg total) by mouth daily. 90 tablet 3  . Brexpiprazole (REXULTI) 1 MG TABS Take 1 mg by mouth every morning. 30 tablet 1  . buPROPion (WELLBUTRIN XL) 300 MG 24 hr tablet Take 300 mg by mouth daily.    . clopidogrel (PLAVIX) 75 MG tablet Take 1 tablet (75 mg total) by mouth daily. 90 tablet 3  . gabapentin (NEURONTIN) 300 MG capsule TAKE 2 CAPSULES EVERY NIGHT 180 capsule 2  . metoprolol (LOPRESSOR) 50 MG tablet Take 1 tablet (50 mg total) by mouth 2 (two) times daily. 180 tablet 3  . nitroGLYCERIN (NITROSTAT) 0.4 MG SL tablet Place 1 tablet (0.4 mg total) under the tongue every 5 (five) minutes as  needed for chest pain (up to 3 tabs in 15 mins and then call 911). 25 tablet 1  . omeprazole (PRILOSEC OTC) 20 MG tablet Take 20 mg by mouth daily.      Marland Kitchen. venlafaxine (EFFEXOR-XR) 150 MG 24 hr capsule Take 150 mg by mouth daily. Brand only    . venlafaxine XR (EFFEXOR-XR) 75 MG 24 hr capsule Take 75 mg by mouth daily with breakfast.      No current facility-administered medications for this visit.    Allergies  Allergen Reactions  . Bee Venom Anaphylaxis  . Statins Other (See Comments)    Myalgias & fatigue  . Etodolac   . Prednisone Other (See Comments)    HR increase; increased energy   . Sulfa Antibiotics Other (See Comments)    Elevated B/P, skin turns red    Past Medical History  Diagnosis Date  . ST-segment elevation myocardial infarction (STEMI) of inferior wall (HCC) 03/07/2011    RCA Occlusion --> PCI of mRCA; ECHO 03/2011: EF > 55%, MIld Inferior HK, Mild AoV Sclerosis.  Marland Kitchen. CAD S/P percutaneous coronary angioplasty 03/07/2011    a) PCI of mRCA with Promus Element DES 3.5 mm x 28 mm (4.2 -- 3.7 mm); b) Myoview 04/2011: EF 81% (small LV Cavity), fixed basal-mid Inferior Infarct,  No Ischemia, 10 METS  . Essential hypertension 03/07/2011  . Hyperlipidemia with target LDL less than 70 2012     Statin Intolerance (tried Lipitor & Crestor)  . History of back surgery   . GERD (gastroesophageal reflux disease)   . Anxiety   . Depression 03/08/2011  . Iron deficiency anemia 03/09/2011  . Insomnia     without significant sleeping disorders  . Neuropathy (HCC)   . Arthritis   . Vocal cord polyp 2005    Height 5\' 5"  (1.651 m), weight 166 lb (75.297 kg).    Phillips Hay PharmD CPP [facility]

## 2015-04-14 NOTE — Patient Instructions (Signed)
We will start the process to get Praluent covered by your insurance.  You may hear from a pharmacy called [facility] regarding this.

## 2015-04-15 ENCOUNTER — Ambulatory Visit (INDEPENDENT_AMBULATORY_CARE_PROVIDER_SITE_OTHER): Payer: 59 | Admitting: Family Medicine

## 2015-04-15 ENCOUNTER — Encounter: Payer: Self-pay | Admitting: Family Medicine

## 2015-04-15 VITALS — BP 102/70 | HR 72 | Temp 97.8°F | Resp 16 | Wt 165.0 lb

## 2015-04-15 DIAGNOSIS — N3090 Cystitis, unspecified without hematuria: Secondary | ICD-10-CM

## 2015-04-15 LAB — POCT URINALYSIS DIPSTICK
GLUCOSE UA: NEGATIVE
KETONES UA: NEGATIVE
Nitrite, UA: NEGATIVE
Urobilinogen, UA: 0.2
pH, UA: 6

## 2015-04-15 MED ORDER — NITROFURANTOIN MONOHYD MACRO 100 MG PO CAPS: 100.0000 mg | ORAL_CAPSULE | Freq: Two times a day (BID) | ORAL | Status: DC

## 2015-04-15 NOTE — Progress Notes (Signed)
Subjective:    Patient ID: Jessica Landry, female    DOB: 04/06/1956, 60 y.o.   MRN: [id number]  Urinary Tract Infection  This is a new problem. The current episode started yesterday. The problem has been gradually worsening. The quality of the pain is described as burning. The pain is at a severity of 4/10. There has been no fever. Associated symptoms include flank pain, hesitancy and urgency. Pertinent negatives include no chills, discharge, frequency, hematuria, nausea, sweats or vomiting. She has tried acetaminophen (cranberry) for the symptoms. The treatment provided no relief.      Review of Systems  Constitutional: Negative for chills.  Gastrointestinal: Negative for nausea and vomiting.  Genitourinary: Positive for hesitancy, urgency and flank pain. Negative for frequency and hematuria.   BP 102/70 mmHg  Pulse 72  Temp(Src) 97.8 F (36.6 C) (Oral)  Resp 16  Wt 165 lb (74.844 kg)   Patient Active Problem List   Diagnosis Date Noted  . Allergic rhinitis 03/30/2015  . Absolute anemia 03/30/2015  . CAD in native artery 03/30/2015  . CN (constipation) 03/30/2015  . History of tobacco use 03/30/2015  . Adenopathy 03/30/2015  . Memory loss, short term 03/30/2015  . Headache, migraine 03/30/2015  . Panaritium of toe 03/30/2015  . Post menopausal syndrome 03/30/2015  . Cannot sleep 03/15/2015  . Pain in the chest 02/25/2015  . SOB (shortness of breath) 02/25/2015  . Allergic to bees 12/07/2014  . Anxiety 12/07/2014  . Gout 12/07/2014  . BP (high blood pressure) 12/07/2014  . Overweight (BMI 25.0-29.9) 11/23/2012  . History of tobacco abuse: Quit in November 2012 11/23/2011  . Presence of stent in right coronary artery 03/11/2011  . Iron deficiency anemia 03/09/2011  . CAD S/P PCI mRCA: Promus Element DES 3.5 mm x 28 mm (4.2-37 mm)) 03/08/2011    Class: Diagnosis of  . Hyperlipidemia with target LDL less than 70; Statin Intolerant 03/08/2011  . Previous back surgery  03/08/2011  . Depression 03/08/2011  . ST elevation myocardial infarction (STEMI) of inferior wall, subsequent episode of care (HCC) 03/07/2011    Class: Acute  . Myocardial infarction (HCC) 03/07/2011  . Heart attack (HCC) 03/07/2011  . Contagious disease 10/15/2009  . Cellulitis of trunk 09/19/2009  . Dermatitis, purulent 03/07/2009  . Encounter for general adult medical examination without abnormal findings 01/05/2009  . Pure hypercholesterolemia 01/05/2009  . Benign essential HTN 01/05/2009  . Acid reflux 01/05/2009  . Narrowing of intervertebral disc space 12/14/2008  . Herniation of nucleus pulposus 12/14/2008   Past Medical History  Diagnosis Date  . ST-segment elevation myocardial infarction (STEMI) of inferior wall (HCC) 03/07/2011    RCA Occlusion --> PCI of mRCA; ECHO 03/2011: EF > 55%, MIld Inferior HK, Mild AoV Sclerosis.  Marland Kitchen. CAD S/P percutaneous coronary angioplasty 03/07/2011    a) PCI of mRCA with Promus Element DES 3.5 mm x 28 mm (4.2 -- 3.7 mm); b) Myoview 04/2011: EF 81% (small LV Cavity), fixed basal-mid Inferior Infarct, No Ischemia, 10 METS  . Essential hypertension 03/07/2011  . Hyperlipidemia with target LDL less than 70 2012     Statin Intolerance (tried Lipitor & Crestor)  . History of back surgery   . GERD (gastroesophageal reflux disease)   . Anxiety   . Depression 03/08/2011  . Iron deficiency anemia 03/09/2011  . Insomnia     without significant sleeping disorders  . Neuropathy (HCC)   . Arthritis   . Vocal cord polyp 2005  Current Outpatient Prescriptions on File Prior to Visit  Medication Sig  . benazepril (LOTENSIN) 5 MG tablet Take 1 tablet (5 mg total) by mouth daily.  Marland Kitchen buPROPion (WELLBUTRIN XL) 300 MG 24 hr tablet Take 300 mg by mouth daily.  . clopidogrel (PLAVIX) 75 MG tablet Take 1 tablet (75 mg total) by mouth daily.  Marland Kitchen gabapentin (NEURONTIN) 300 MG capsule TAKE 2 CAPSULES EVERY NIGHT  . metoprolol (LOPRESSOR) 50 MG tablet Take 1  tablet (50 mg total) by mouth 2 (two) times daily.  . nitroGLYCERIN (NITROSTAT) 0.4 MG SL tablet Place 1 tablet (0.4 mg total) under the tongue every 5 (five) minutes as needed for chest pain (up to 3 tabs in 15 mins and then call 911).  Marland Kitchen omeprazole (PRILOSEC OTC) 20 MG tablet Take 20 mg by mouth daily.    Marland Kitchen venlafaxine (EFFEXOR-XR) 150 MG 24 hr capsule Take 150 mg by mouth daily. Brand only  . venlafaxine XR (EFFEXOR-XR) 75 MG 24 hr capsule Take 75 mg by mouth daily with breakfast.   . Brexpiprazole (REXULTI) 1 MG TABS Take 1 mg by mouth every morning. (Patient not taking: Reported on 04/15/2015)   No current facility-administered medications on file prior to visit.   Allergies  Allergen Reactions  . Bee Venom Anaphylaxis  . Statins Other (See Comments)    Myalgias & fatigue  . Etodolac   . Prednisone Other (See Comments)    HR increase; increased energy   . Sulfa Antibiotics Other (See Comments)    Elevated B/P, skin turns red   Past Surgical History  Procedure Laterality Date  . Abdominal hysterectomy    . Back surgery    . Doppler echocardiography  03/27/2011    LV cavity is small,EF =>55%,MILD INFERIOR HYPOKINESIS; Mild Aortic Sclerosis  . Nm myocar perf wall motion  04/20/2011    EF 81% (due to small LVcavity),fixed basal to mid inferior INFARCT - NO ISCHEMIA; 10 METs  . Coronary angioplasty with stent placement  03/07/2011    inferior STEMI - RCA PROMUS 3.5 mm x 28 mm DES  (post Dil 3.7 distal to 4.2 prox)  . Cardiac catheterization  03/08/2011    improved flow from the PCI  . Left heart catheterization with coronary angiogram N/A 03/07/2011    Procedure: LEFT HEART CATHETERIZATION WITH CORONARY ANGIOGRAM;  Surgeon: Marykay Lex, MD;  Location: [facility] CATH LAB;  Service: Cardiovascular;  Laterality: N/A;  . Left heart catheterization with coronary angiogram N/A 03/08/2011    Procedure: LEFT HEART CATHETERIZATION WITH CORONARY ANGIOGRAM;  Surgeon: Marykay Lex, MD;   Location: [facility] CATH LAB;  Service: Cardiovascular;  Laterality: N/A;  . Knee surgery Left 2015  . Knee surgery Right 2010    Baker cyst  . Dental surgery  2016  . Upper gi endoscopy  02-22-03    Dr. Lemar Livings, normal  . Breast biopsy Right 01/31/1999    fibrocystic changes, ductal adenosis, focal atypical ductal epithelium.  . Cholecystectomy  03/25/1998    Dr. Lemar Livings, chronic cholecystitis.  . Skin nodule Left 09/11/1999    dermatofibroma left lower leg, positive lateral margin.  Marland Kitchen Appendectomy  07/04/2000    Normal appendix, adhesions noted.  . Colonoscopy with propofol N/A 09/28/2014    Procedure: COLONOSCOPY WITH PROPOFOL;  Surgeon: Earline Mayotte, MD;  Location: [facility] ENDOSCOPY;  Service: Endoscopy;  Laterality: N/A;  . Esophagogastroduodenoscopy N/A 09/28/2014    Procedure: ESOPHAGOGASTRODUODENOSCOPY (EGD);  Surgeon: Earline Mayotte, MD;  Location: [facility] ENDOSCOPY;  Service: Endoscopy;  Laterality: N/A;   Social History   Social History  . Marital Status: Married    Spouse Name: N/A  . Number of Children: N/A  . Years of Education: N/A   Occupational History  . Not on file.   Social History Main Topics  . Smoking status: Former Smoker -- 0.25 packs/day for 40 years    Types: Cigarettes    Quit date: 03/07/2011  . Smokeless tobacco: Never Used  . Alcohol Use: No  . Drug Use: No  . Sexual Activity: Yes   Other Topics Concern  . Not on file   Social History Narrative   Divorced woman -- Now Remarried to her long-term partner.   Exercises routinely on a daily basis roughly 45 minutes a day walking.  Quit smoking at the time of her MI. Does not drink.   Family History  Problem Relation Age of Onset  . Heart attack Mother   . Coronary artery disease Mother   . Stroke Mother   . Depression Mother   . Anxiety disorder Mother   . Alzheimer's disease Father   . Stroke Father   . Breast cancer Sister 46  . Depression Brother   . Drug abuse Brother   . Bipolar  disorder Sister       Objective:   Physical Exam  Constitutional: She appears well-developed and well-nourished.  Abdominal: Soft. There is no tenderness.  No CVA tenderness  Psychiatric: She has a normal mood and affect. Her behavior is normal.   BP 102/70 mmHg  Pulse 72  Temp(Src) 97.8 F (36.6 C) (Oral)  Resp 16  Wt 165 lb (74.844 kg)     Assessment & Plan:  1. Cystitis New problem. Send urine for cx. FU pending results. Start Macrobid as below. Call if sx do not improve. - POCT urinalysis dipstick - Urine culture - nitrofurantoin, macrocrystal-monohydrate, (MACROBID) 100 MG capsule; Take 1 capsule (100 mg total) by mouth 2 (two) times daily.  Dispense: 14 capsule; Refill: 0   Patient was seen and examined by Leo Grosser, MD, and note scribed by Allene Dillon, CMA. I have reviewed the document for accuracy and completeness and I agree with above. Leo Grosser, MD   Jessica Phenix, MD

## 2015-04-16 LAB — URINE CULTURE

## 2015-04-19 ENCOUNTER — Telehealth: Payer: Self-pay

## 2015-04-19 NOTE — Telephone Encounter (Signed)
-----   Message from Lorie PhenixNancy Maloney, MD sent at 04/16/2015  5:51 PM EST ----- Urine with mixed flora.  No clear pathogen.   Patient was treated.  Please see how she is feeling.  Thanks.

## 2015-04-19 NOTE — Telephone Encounter (Signed)
Advised patient as below. Patient reports that she is feeling much better since starting the antibiotics.

## 2015-04-29 ENCOUNTER — Ambulatory Visit (INDEPENDENT_AMBULATORY_CARE_PROVIDER_SITE_OTHER): Payer: 59 | Admitting: Psychiatry

## 2015-04-29 ENCOUNTER — Encounter: Payer: Self-pay | Admitting: Psychiatry

## 2015-04-29 VITALS — BP 122/78 | HR 67 | Temp 97.0°F | Ht 65.0 in | Wt 162.0 lb

## 2015-04-29 DIAGNOSIS — F331 Major depressive disorder, recurrent, moderate: Secondary | ICD-10-CM

## 2015-04-29 DIAGNOSIS — Z634 Disappearance and death of family member: Secondary | ICD-10-CM | POA: Diagnosis not present

## 2015-04-29 MED ORDER — CLONAZEPAM 0.5 MG PO TABS: 0.5000 mg | ORAL_TABLET | Freq: Two times a day (BID) | ORAL | Status: DC | PRN

## 2015-04-29 MED ORDER — VENLAFAXINE HCL ER 37.5 MG PO CP24: 37.5000 mg | ORAL_CAPSULE | ORAL | Status: DC

## 2015-04-29 MED ORDER — BUPROPION HCL ER (XL) 300 MG PO TB24: 300.0000 mg | ORAL_TABLET | Freq: Every day | ORAL | Status: DC

## 2015-04-29 NOTE — Progress Notes (Signed)
BH MD/PA/NP OP Progress Note  04/29/2015 11:37 AM Jessica Landry  MRN:  [id number]  Subjective:  Patient returns for follow-up of her major depressive disorder, recurrent moderate and bereavement. He presents to the point with her husband. She states she continues to not do well and her depression has been bad. She brings a Beck Depression Inventory that she states was done at her therapist's office and has a score of 38. We have made attempts to try to augment her regimen of Effexor XR and Wellbutrin XL. However Abilify did not work and she was unable to tolerate Rexulti. We are now going to taper her off Effexor so that she can start another antidepressant.  She states she's just not happy, she cries easily and response to things and she has no interest.   Chief Complaint: Depression Chief Complaint    Follow-up; Medication Refill     Visit Diagnosis:   No diagnosis found.  Past Medical History:  Past Medical History  Diagnosis Date  . ST-segment elevation myocardial infarction (STEMI) of inferior wall (HCC) 03/07/2011    RCA Occlusion --> PCI of mRCA; ECHO 03/2011: EF > 55%, MIld Inferior HK, Mild AoV Sclerosis.  Marland Kitchen CAD S/P percutaneous coronary angioplasty 03/07/2011    a) PCI of mRCA with Promus Element DES 3.5 mm x 28 mm (4.2 -- 3.7 mm); b) Myoview 04/2011: EF 81% (small LV Cavity), fixed basal-mid Inferior Infarct, No Ischemia, 10 METS  . Essential hypertension 03/07/2011  . Hyperlipidemia with target LDL less than 70 2012     Statin Intolerance (tried Lipitor & Crestor)  . History of back surgery   . GERD (gastroesophageal reflux disease)   . Anxiety   . Depression 03/08/2011  . Iron deficiency anemia 03/09/2011  . Insomnia     without significant sleeping disorders  . Neuropathy (HCC)   . Arthritis   . Vocal cord polyp 2005    Past Surgical History  Procedure Laterality Date  . Abdominal hysterectomy    . Back surgery    . Doppler echocardiography  03/27/2011    LV  cavity is small,EF =>55%,MILD INFERIOR HYPOKINESIS; Mild Aortic Sclerosis  . Nm myocar perf wall motion  04/20/2011    EF 81% (due to small LVcavity),fixed basal to mid inferior INFARCT - NO ISCHEMIA; 10 METs  . Coronary angioplasty with stent placement  03/07/2011    inferior STEMI - RCA PROMUS 3.5 mm x 28 mm DES  (post Dil 3.7 distal to 4.2 prox)  . Cardiac catheterization  03/08/2011    improved flow from the PCI  . Left heart catheterization with coronary angiogram N/A 03/07/2011    Procedure: LEFT HEART CATHETERIZATION WITH CORONARY ANGIOGRAM;  Surgeon: Marykay Lex, MD;  Location: [facility] CATH LAB;  Service: Cardiovascular;  Laterality: N/A;  . Left heart catheterization with coronary angiogram N/A 03/08/2011    Procedure: LEFT HEART CATHETERIZATION WITH CORONARY ANGIOGRAM;  Surgeon: Marykay Lex, MD;  Location: [facility] CATH LAB;  Service: Cardiovascular;  Laterality: N/A;  . Knee surgery Left 2015  . Knee surgery Right 2010    Baker cyst  . Dental surgery  2016  . Upper gi endoscopy  02-22-03    Dr. Lemar Livings, normal  . Breast biopsy Right 01/31/1999    fibrocystic changes, ductal adenosis, focal atypical ductal epithelium.  . Cholecystectomy  03/25/1998    Dr. Lemar Livings, chronic cholecystitis.  . Skin nodule Left 09/11/1999    dermatofibroma left lower leg, positive lateral margin.  Marland Kitchen  Appendectomy  07/04/2000    Normal appendix, adhesions noted.  . Colonoscopy with propofol N/A 09/28/2014    Procedure: COLONOSCOPY WITH PROPOFOL;  Surgeon: Earline Mayotte, MD;  Location: [facility] ENDOSCOPY;  Service: Endoscopy;  Laterality: N/A;  . Esophagogastroduodenoscopy N/A 09/28/2014    Procedure: ESOPHAGOGASTRODUODENOSCOPY (EGD);  Surgeon: Earline Mayotte, MD;  Location: [facility] ENDOSCOPY;  Service: Endoscopy;  Laterality: N/A;   Family History:  Family History  Problem Relation Age of Onset  . Heart attack Mother   . Coronary artery disease Mother   . Stroke Mother   . Depression Mother   .  Anxiety disorder Mother   . Alzheimer's disease Father   . Stroke Father   . Breast cancer Sister 42  . Depression Brother   . Drug abuse Brother   . Bipolar disorder Sister    Social History:  Social History   Social History  . Marital Status: Married    Spouse Name: N/A  . Number of Children: N/A  . Years of Education: N/A   Social History Main Topics  . Smoking status: Former Smoker -- 0.25 packs/day for 40 years    Types: Cigarettes    Quit date: 03/07/2011  . Smokeless tobacco: Never Used  . Alcohol Use: No  . Drug Use: No  . Sexual Activity: Yes   Other Topics Concern  . None   Social History Narrative   Divorced woman -- Now Remarried to her long-term partner.   Exercises routinely on a daily basis roughly 45 minutes a day walking.  Quit smoking at the time of her MI. Does not drink.   Additional History:   Assessment:   Musculoskeletal: Strength & Muscle Tone: within normal limits Gait & Station: normal Patient leans: N/A  Psychiatric Specialty Exam: Depression        Associated symptoms include does not have insomnia and no suicidal ideas.   Review of Systems  Psychiatric/Behavioral: Positive for depression. Negative for suicidal ideas, hallucinations, memory loss and substance abuse. The patient is not nervous/anxious and does not have insomnia.   All other systems reviewed and are negative.   Blood pressure 122/78, pulse 67, temperature 97 F (36.1 C), temperature source Tympanic, height  (1.651 m), weight 162 lb (73.483 kg), SpO2 98 %.Body mass index is 26.96 kg/(m^2).  General Appearance: Neat and Well Groomed  Eye Contact:  Good  Speech:  Normal Rate  Volume:  Normal  Mood:  Depressed  Affect:  Constricted   Thought Process:  Linear  Orientation:  Full (Time, Place, and Person)  Thought Content:  Negative  Suicidal Thoughts:  No  Homicidal Thoughts:  No  Memory:  Immediate;   Good Recent;   Good Remote;   Good  Judgement:  Good   Insight:  Good  Psychomotor Activity:  Negative  Concentration:  Good  Recall:  Good  Fund of Knowledge: Negative  Language: Good  Akathisia:  Negative  Handed:  Right unknown   AIMS (if indicated):  Not done today, done 12/07/14  Assets:  Communication Skills Desire for Improvement  ADL's:  Intact  Cognition: WNL  Sleep:  good   Is the patient at risk to self?  No. Has the patient been a risk to self in the past 6 months?  No. Has the patient been a risk to self within the distant past?  No. Is the patient a risk to others?  No. Has the patient been a risk to others in the past 6  months?  No. Has the patient been a risk to others within the distant past?  No.  Current Medications: Current Outpatient Prescriptions  Medication Sig Dispense Refill  . benazepril (LOTENSIN) 5 MG tablet Take 1 tablet (5 mg total) by mouth daily. 90 tablet 3  . buPROPion (WELLBUTRIN XL) 300 MG 24 hr tablet Take 1 tablet (300 mg total) by mouth daily. 30 tablet 4  . clopidogrel (PLAVIX) 75 MG tablet Take 1 tablet (75 mg total) by mouth daily. 90 tablet 3  . gabapentin (NEURONTIN) 300 MG capsule TAKE 2 CAPSULES EVERY NIGHT 180 capsule 2  . metoprolol (LOPRESSOR) 50 MG tablet Take 1 tablet (50 mg total) by mouth 2 (two) times daily. 180 tablet 3  . nitroGLYCERIN (NITROSTAT) 0.4 MG SL tablet Place 1 tablet (0.4 mg total) under the tongue every 5 (five) minutes as needed for chest pain (up to 3 tabs in 15 mins and then call 911). 25 tablet 1  . omeprazole (PRILOSEC OTC) 20 MG tablet Take 20 mg by mouth daily.      . clonazePAM (KLONOPIN) 0.5 MG tablet Take 1 tablet (0.5 mg total) by mouth 2 (two) times daily as needed for anxiety. 60 tablet 0  . nitrofurantoin, macrocrystal-monohydrate, (MACROBID) 100 MG capsule Take 1 capsule (100 mg total) by mouth 2 (two) times daily. (Patient not taking: Reported on 04/29/2015) 14 capsule 0   No current facility-administered medications for this visit.    Medical  Decision Making:  Established Problem, Stable/Improving (1) and Review of Medication Regimen & Side Effects (2)  Treatment Plan Summary:Medication management and Plan   Major depressive disorder, recurrent, moderate-We will continue Wellbutrin XL 300 mg daily. Where going to taper her off of Effexor XR. She's been instructed to take 150 mg in the morning for 1 week and then 75 mg morning and for 1 week and then 37.5 mg in the morning for 1 week and then discontinue Effexor XR. Provide her with some Klonopin 0.5 mg take twice a day as needed if she develops some anxiety as she is coming off the Effexor. Risk and benefits discussed and patient able to consent.   Bereavement-patient is involved in grief counseling.  Patient will follow-up with me in 2-3 weeks, at that time we can assess starting another antidepressant. She states that she did well on Prozac for some time in the past but it stopped working and thus we may try this medicine again or start another serotonergic antidepressant in conjunction with her Wellbutrin XL. She's been encouraged call any questions or concerns prior to her next appointment.    Wallace Going 04/29/2015, 11:37 AM

## 2015-04-29 NOTE — Patient Instructions (Signed)
Take Effexor XR 150 mg in the morning for one week, then take 75 mg in the morning for one week, the take 37.5 mg in the morning for one week, then discontinue Effexor XR.

## 2015-05-18 ENCOUNTER — Encounter: Payer: Self-pay | Admitting: Psychiatry

## 2015-05-18 ENCOUNTER — Ambulatory Visit (INDEPENDENT_AMBULATORY_CARE_PROVIDER_SITE_OTHER): Payer: 59 | Admitting: Psychiatry

## 2015-05-18 VITALS — BP 138/88 | HR 66 | Temp 98.2°F | Ht 65.0 in | Wt 161.2 lb

## 2015-05-18 DIAGNOSIS — F331 Major depressive disorder, recurrent, moderate: Secondary | ICD-10-CM | POA: Diagnosis not present

## 2015-05-18 DIAGNOSIS — Z634 Disappearance and death of family member: Secondary | ICD-10-CM

## 2015-05-18 MED ORDER — FLUOXETINE HCL 10 MG PO CAPS: ORAL_CAPSULE | ORAL | Status: DC

## 2015-05-18 NOTE — Patient Instructions (Signed)
START fluoxetine/prozac once OFF the Effexor XR.

## 2015-05-18 NOTE — Progress Notes (Signed)
BH MD/PA/NP OP Progress Note  05/18/2015 12:14 PM Jessica Landry  MRN:  [id number]  Subjective:  Patient returns for follow-up of her major depressive disorder, recurrent moderate and bereavement. She presents today as follow-up during a taper off Effexor XR. She states she has 3 more days at 37.5 mg. She states that she did have some issues with headache but overall she states it is not been too difficult. She has not had to use any of the clonazepam that I gave her to deal with any possible withdrawal from the Effexor.  We discussed that at this time we would plan for her to start Prozac once she is off the Effexor. She'll take that in conjunction with her Wellbutrin XL. Patient reports some past success with Prozac but states it stopped working. She is sure that she did not take Prozac with Wellbutrin in the past.  Chief Complaint: Depression Chief Complaint    Follow-up; Medication Refill     Visit Diagnosis:   No diagnosis found.  Past Medical History:  Past Medical History  Diagnosis Date  . ST-segment elevation myocardial infarction (STEMI) of inferior wall (HCC) 03/07/2011    RCA Occlusion --> PCI of mRCA; ECHO 03/2011: EF > 55%, MIld Inferior HK, Mild AoV Sclerosis.  Marland Kitchen CAD S/P percutaneous coronary angioplasty 03/07/2011    a) PCI of mRCA with Promus Element DES 3.5 mm x 28 mm (4.2 -- 3.7 mm); b) Myoview 04/2011: EF 81% (small LV Cavity), fixed basal-mid Inferior Infarct, No Ischemia, 10 METS  . Essential hypertension 03/07/2011  . Hyperlipidemia with target LDL less than 70 2012     Statin Intolerance (tried Lipitor & Crestor)  . History of back surgery   . GERD (gastroesophageal reflux disease)   . Anxiety   . Depression 03/08/2011  . Iron deficiency anemia 03/09/2011  . Insomnia     without significant sleeping disorders  . Neuropathy (HCC)   . Arthritis   . Vocal cord polyp 2005    Past Surgical History  Procedure Laterality Date  . Abdominal hysterectomy    .  Back surgery    . Doppler echocardiography  03/27/2011    LV cavity is small,EF =>55%,MILD INFERIOR HYPOKINESIS; Mild Aortic Sclerosis  . Nm myocar perf wall motion  04/20/2011    EF 81% (due to small LVcavity),fixed basal to mid inferior INFARCT - NO ISCHEMIA; 10 METs  . Coronary angioplasty with stent placement  03/07/2011    inferior STEMI - RCA PROMUS 3.5 mm x 28 mm DES  (post Dil 3.7 distal to 4.2 prox)  . Cardiac catheterization  03/08/2011    improved flow from the PCI  . Left heart catheterization with coronary angiogram N/A 03/07/2011    Procedure: LEFT HEART CATHETERIZATION WITH CORONARY ANGIOGRAM;  Surgeon: Marykay Lex, MD;  Location[facility] CATH LAB;  Service: Cardiovascular;  Laterality: N/A;  . Left heart catheterization with coronary angiogram N/A 03/08/2011    Procedure: LEFT HEART CATHETERIZATION WITH CORONARY ANGIOGRAM;  Surgeon: Marykay Lex, MD;  Location: [facility] CATH LAB;  Service: Cardiovascular;  Laterality: N/A;  . Knee surgery Left 2015  . Knee surgery Right 2010    Baker cyst  . Dental surgery  2016  . Upper gi endoscopy  02-22-03    Dr. Lemar Livings, normal  . Breast biopsy Right 01/31/1999    fibrocystic changes, ductal adenosis, focal atypical ductal epithelium.  . Cholecystectomy  03/25/1998    Dr. Lemar Livings, chronic cholecystitis.  . Skin nodule Left  09/11/1999    dermatofibroma left lower leg, positive lateral margin.  Marland Kitchen Appendectomy  07/04/2000    Normal appendix, adhesions noted.  . Colonoscopy with propofol N/A 09/28/2014    Procedure: COLONOSCOPY WITH PROPOFOL;  Surgeon: Earline Mayotte, MD;  Location: [facility] ENDOSCOPY;  Service: Endoscopy;  Laterality: N/A;  . Esophagogastroduodenoscopy N/A 09/28/2014    Procedure: ESOPHAGOGASTRODUODENOSCOPY (EGD);  Surgeon: Earline Mayotte, MD;  Location: [facility] ENDOSCOPY;  Service: Endoscopy;  Laterality: N/A;   Family History:  Family History  Problem Relation Age of Onset  . Heart attack Mother   . Coronary artery  disease Mother   . Stroke Mother   . Depression Mother   . Anxiety disorder Mother   . Alzheimer's disease Father   . Stroke Father   . Breast cancer Sister 44  . Depression Brother   . Drug abuse Brother   . Bipolar disorder Sister    Social History:  Social History   Social History  . Marital Status: Married    Spouse Name: N/A  . Number of Children: N/A  . Years of Education: N/A   Social History Main Topics  . Smoking status: Former Smoker -- 0.25 packs/day for 40 years    Types: Cigarettes    Quit date: 03/07/2011  . Smokeless tobacco: Never Used  . Alcohol Use: No  . Drug Use: No  . Sexual Activity: Yes   Other Topics Concern  . None   Social History Narrative   Divorced woman -- Now Remarried to her long-term partner.   Exercises routinely on a daily basis roughly 45 minutes a day walking.  Quit smoking at the time of her MI. Does not drink.   Additional History:   Assessment:   Musculoskeletal: Strength & Muscle Tone: within normal limits Gait & Station: normal Patient leans: N/A  Psychiatric Specialty Exam: Depression        Associated symptoms include does not have insomnia and no suicidal ideas.   Review of Systems  Psychiatric/Behavioral: Positive for depression. Negative for suicidal ideas, hallucinations, memory loss and substance abuse. The patient is not nervous/anxious and does not have insomnia.   All other systems reviewed and are negative.   Blood pressure 138/88, pulse 66, temperature 98.2 F (36.8 C), temperature source Tympanic, height 5\' 5"  (1.651 m), weight 161 lb 3.2 oz (73.12 kg), SpO2 96 %.Body mass index is 26.83 kg/(m^2).  General Appearance: Neat and Well Groomed  Eye Contact:  Good  Speech:  Normal Rate  Volume:  Normal  Mood:  Depressed  Affect:  Constricted   Thought Process:  Linear  Orientation:  Full (Time, Place, and Person)  Thought Content:  Negative  Suicidal Thoughts:  No  Homicidal Thoughts:  No  Memory:   Immediate;   Good Recent;   Good Remote;   Good  Judgement:  Good  Insight:  Good  Psychomotor Activity:  Negative  Concentration:  Good  Recall:  Good  Fund of Knowledge: Negative  Language: Good  Akathisia:  Negative  Handed:  Right unknown   AIMS (if indicated):  Not done today, done 12/07/14  Assets:  Communication Skills Desire for Improvement  ADL's:  Intact  Cognition: WNL  Sleep:  good   Is the patient at risk to self?  No. Has the patient been a risk to self in the past 6 months?  No. Has the patient been a risk to self within the distant past?  No. Is the patient a risk to  others?  No. Has the patient been a risk to others in the past 6 months?  No. Has the patient been a risk to others within the distant past?  No.  Current Medications: Current Outpatient Prescriptions  Medication Sig Dispense Refill  . benazepril (LOTENSIN) 5 MG tablet Take 1 tablet (5 mg total) by mouth daily. 90 tablet 3  . buPROPion (WELLBUTRIN XL) 300 MG 24 hr tablet Take 1 tablet (300 mg total) by mouth daily. 30 tablet 4  . clonazePAM (KLONOPIN) 0.5 MG tablet Take 1 tablet (0.5 mg total) by mouth 2 (two) times daily as needed for anxiety. 60 tablet 0  . clopidogrel (PLAVIX) 75 MG tablet Take 1 tablet (75 mg total) by mouth daily. 90 tablet 3  . FLUoxetine (PROZAC) 10 MG capsule Take one tablet in the morning for seven days then increase to two tablets in the morning. Start the day after off Effexor XR. 60 capsule 1  . gabapentin (NEURONTIN) 300 MG capsule TAKE 2 CAPSULES EVERY NIGHT 180 capsule 2  . metoprolol (LOPRESSOR) 50 MG tablet Take 1 tablet (50 mg total) by mouth 2 (two) times daily. 180 tablet 3  . nitrofurantoin, macrocrystal-monohydrate, (MACROBID) 100 MG capsule Take 1 capsule (100 mg total) by mouth 2 (two) times daily. 14 capsule 0  . nitroGLYCERIN (NITROSTAT) 0.4 MG SL tablet Place 1 tablet (0.4 mg total) under the tongue every 5 (five) minutes as needed for chest pain (up to 3  tabs in 15 mins and then call 911). 25 tablet 1  . omeprazole (PRILOSEC OTC) 20 MG tablet Take 20 mg by mouth daily.       No current facility-administered medications for this visit.    Medical Decision Making:  Established Problem, Stable/Improving (1) and Review of Medication Regimen & Side Effects (2)  Treatment Plan Summary:Medication management and Plan   Major depressive disorder, recurrent, moderate-We will continue Wellbutrin XL 300 mg daily. We will finish 3 more days of Effexor XR 37.5 mg and then it will be discontinued. At that time she's been instructed to start Prozac 10 mg in the morning for 7 days and then she'll go to 20 mg in the morning. And benefits have been discussed and patient is able to consent.   Bereavement-patient is involved in grief counseling.  Patient will hollow up in 1 month. He is aware she'll follow-up with a new provider within this clinic. She's been encouraged call any questions or concerns prior to her next appointment.    Wallace Going 05/18/2015, 12:14 PM

## 2015-05-20 ENCOUNTER — Telehealth: Payer: Self-pay | Admitting: Family Medicine

## 2015-05-20 MED ORDER — OSELTAMIVIR PHOSPHATE 75 MG PO CAPS: 75.0000 mg | ORAL_CAPSULE | Freq: Every day | ORAL | Status: DC

## 2015-05-20 NOTE — Telephone Encounter (Signed)
Advised patient as below.  

## 2015-05-20 NOTE — Telephone Encounter (Signed)
Tried calling patient and no answer. Will try again later.  

## 2015-05-20 NOTE — Telephone Encounter (Signed)
Sent rx. Thanks.  

## 2015-05-20 NOTE — Telephone Encounter (Signed)
Pt states her husband was seen at [facility] today and tested positive for the flu.  Pt is having no symptoms at this time. Pt is requesting  a Rx for Tamiflu.  [facility] [address]  ZO#[phone]/[phone]

## 2015-05-24 ENCOUNTER — Other Ambulatory Visit: Payer: Self-pay | Admitting: Physician Assistant

## 2015-05-24 NOTE — H&P (Signed)
TOTAL KNEE ADMISSION H&P  Patient is being admitted for bilaterally total knee arthroplasty.  Subjective:  Chief Complaint:bilaterally knee pain.  HPI: Jessica Landry, 60 y.o. female, has a history of pain and functional disability in the bilaterally knee due to arthritis and has failed non-surgical conservative treatments for greater than 12 weeks to includeNSAID's and/or analgesics.  Onset of symptoms was gradual, starting 10 years ago with gradually worsening course since that time. The patient noted no past surgery on the bilaterally knee(s).  Patient currently rates pain in the bilaterally knee(s) at 7 out of 10 with activity. Patient has night pain, worsening of pain with activity and weight bearing and pain that interferes with activities of daily living.  Patient has evidence of subchondral sclerosis and joint space narrowing by imaging studies. There is no active infection.  Patient Active Problem List   Diagnosis Date Noted  . Allergic rhinitis 03/30/2015  . Absolute anemia 03/30/2015  . CAD in native artery 03/30/2015  . CN (constipation) 03/30/2015  . History of tobacco use 03/30/2015  . Adenopathy 03/30/2015  . Memory loss, short term 03/30/2015  . Headache, migraine 03/30/2015  . Panaritium of toe 03/30/2015  . Post menopausal syndrome 03/30/2015  . Cannot sleep 03/15/2015  . Pain in the chest 02/25/2015  . SOB (shortness of breath) 02/25/2015  . Allergic to bees 12/07/2014  . Anxiety 12/07/2014  . Gout 12/07/2014  . BP (high blood pressure) 12/07/2014  . Overweight (BMI 25.0-29.9) 11/23/2012  . History of tobacco abuse: Quit in November 2012 11/23/2011  . Presence of stent in right coronary artery 03/11/2011  . Iron deficiency anemia 03/09/2011  . CAD S/P PCI mRCA: Promus Element DES 3.5 mm x 28 mm (4.2-37 mm)) 03/08/2011    Class: Diagnosis of  . Hyperlipidemia with target LDL less than 70; Statin Intolerant 03/08/2011  . Previous back surgery 03/08/2011  .  Depression 03/08/2011  . ST elevation myocardial infarction (STEMI) of inferior wall, subsequent episode of care (HCC) 03/07/2011    Class: Acute  . Myocardial infarction (HCC) 03/07/2011  . Heart attack (HCC) 03/07/2011  . Contagious disease 10/15/2009  . Cellulitis of trunk 09/19/2009  . Dermatitis, purulent 03/07/2009  . Encounter for general adult medical examination without abnormal findings 01/05/2009  . Pure hypercholesterolemia 01/05/2009  . Benign essential HTN 01/05/2009  . Acid reflux 01/05/2009  . Narrowing of intervertebral disc space 12/14/2008  . Herniation of nucleus pulposus 12/14/2008   Past Medical History  Diagnosis Date  . ST-segment elevation myocardial infarction (STEMI) of inferior wall (HCC) 03/07/2011    RCA Occlusion --> PCI of mRCA; ECHO 03/2011: EF > 55%, MIld Inferior HK, Mild AoV Sclerosis.  Marland Kitchen CAD S/P percutaneous coronary angioplasty 03/07/2011    a) PCI of mRCA with Promus Element DES 3.5 mm x 28 mm (4.2 -- 3.7 mm); b) Myoview 04/2011: EF 81% (small LV Cavity), fixed basal-mid Inferior Infarct, No Ischemia, 10 METS  . Essential hypertension 03/07/2011  . Hyperlipidemia with target LDL less than 70 2012     Statin Intolerance (tried Lipitor & Crestor)  . History of back surgery   . GERD (gastroesophageal reflux disease)   . Anxiety   . Depression 03/08/2011  . Iron deficiency anemia 03/09/2011  . Insomnia     without significant sleeping disorders  . Neuropathy (HCC)   . Arthritis   . Vocal cord polyp 2005    Past Surgical History  Procedure Laterality Date  . Abdominal hysterectomy    .  Back surgery    . Doppler echocardiography  03/27/2011    LV cavity is small,EF =>55%,MILD INFERIOR HYPOKINESIS; Mild Aortic Sclerosis  . Nm myocar perf wall motion  04/20/2011    EF 81% (due to small LVcavity),fixed basal to mid inferior INFARCT - NO ISCHEMIA; 10 METs  . Coronary angioplasty with stent placement  03/07/2011    inferior STEMI - RCA PROMUS 3.5  mm x 28 mm DES  (post Dil 3.7 distal to 4.2 prox)  . Cardiac catheterization  03/08/2011    improved flow from the PCI  . Left heart catheterization with coronary angiogram N/A 03/07/2011    Procedure: LEFT HEART CATHETERIZATION WITH CORONARY ANGIOGRAM;  Surgeon: Marykay Lex, MD;  Location: [facility] CATH LAB;  Service: Cardiovascular;  Laterality: N/A;  . Left heart catheterization with coronary angiogram N/A 03/08/2011    Procedure: LEFT HEART CATHETERIZATION WITH CORONARY ANGIOGRAM;  Surgeon: Marykay Lex, MD;  Location: [facility] CATH LAB;  Service: Cardiovascular;  Laterality: N/A;  . Knee surgery Left 2015  . Knee surgery Right 2010    Baker cyst  . Dental surgery  2016  . Upper gi endoscopy  02-22-03    Dr. Lemar Livings, normal  . Breast biopsy Right 01/31/1999    fibrocystic changes, ductal adenosis, focal atypical ductal epithelium.  . Cholecystectomy  03/25/1998    Dr. Lemar Livings, chronic cholecystitis.  . Skin nodule Left 09/11/1999    dermatofibroma left lower leg, positive lateral margin.  Marland Kitchen Appendectomy  07/04/2000    Normal appendix, adhesions noted.  . Colonoscopy with propofol N/A 09/28/2014    Procedure: COLONOSCOPY WITH PROPOFOL;  Surgeon: Earline Mayotte, MD;  Location: [facility] ENDOSCOPY;  Service: Endoscopy;  Laterality: N/A;  . Esophagogastroduodenoscopy N/A 09/28/2014    Procedure: ESOPHAGOGASTRODUODENOSCOPY (EGD);  Surgeon: Earline Mayotte, MD;  Location: [facility] ENDOSCOPY;  Service: Endoscopy;  Laterality: N/A;     (Not in a hospital admission) Allergies  Allergen Reactions  . Bee Venom Anaphylaxis  . Statins Other (See Comments)    Myalgias & fatigue  . Etodolac   . Prednisone Other (See Comments)    HR increase; increased energy   . Sulfa Antibiotics Other (See Comments)    Elevated B/P, skin turns red    Social History  Substance Use Topics  . Smoking status: Former Smoker -- 0.25 packs/day for 40 years    Types: Cigarettes    Quit date: 03/07/2011  . Smokeless  tobacco: Never Used  . Alcohol Use: No    Family History  Problem Relation Age of Onset  . Heart attack Mother   . Coronary artery disease Mother   . Stroke Mother   . Depression Mother   . Anxiety disorder Mother   . Alzheimer's disease Father   . Stroke Father   . Breast cancer Sister 58  . Depression Brother   . Drug abuse Brother   . Bipolar disorder Sister      Review of Systems  Constitutional: Negative.   HENT: Negative.   Eyes: Negative.   Respiratory: Negative.   Cardiovascular: Negative.   Gastrointestinal: Negative.   Genitourinary: Negative.   Musculoskeletal: Positive for joint pain.  Skin: Negative.   Neurological: Negative.   Endo/Heme/Allergies: Negative.   Psychiatric/Behavioral: Negative.     Objective:  Physical Exam  Constitutional: She is oriented to person, place, and time. She appears well-developed and well-nourished.  HENT:  Head: Normocephalic and atraumatic.  Eyes: EOM are normal. Pupils are equal, round, and reactive to  light.  Neck: Normal range of motion. Neck supple.  Cardiovascular: Normal rate and regular rhythm.   Respiratory: Effort normal and breath sounds normal.  GI: Soft. Bowel sounds are normal.  Musculoskeletal:  Markedly antalgic gait with valgus thrust, right greater than left.  Negative straight leg raise, both sides.  Negative log roll, both hips.  Right knee motion 0-125 with 15 or more degrees of valgus which is partially correctable.  On the left her motion is 0-130 with increasing valgus, but not as extreme.  Significant patellofemoral and tibiofemoral crepitus, both sides.  Neurovascularly intact distally  Neurological: She is alert and oriented to person, place, and time.  Skin: Skin is warm and dry.  Psychiatric: She has a normal mood and affect. Her behavior is normal. Judgment and thought content normal.    Vital signs in last 24 hours: @  Labs:   Estimated body mass index is 26.83 kg/(m^2) as  calculated from the following:   Height as of 05/18/15:  (1.651 m).   Weight as of 05/18/15: 73.12 kg (161 lb 3.2 oz).   Imaging Review Plain radiographs demonstrate severe degenerative joint disease of the bilaterally knee(s). The overall alignment ismild valgus. The bone quality appears to be fair for age and reported activity level.  Assessment/Plan:  End stage arthritis, bilaterally knee   The patient history, physical examination, clinical judgment of the provider and imaging studies are consistent with end stage degenerative joint disease of the bilaterally knee(s) and total knee arthroplasty is deemed medically necessary. The treatment options including medical management, injection therapy arthroscopy and arthroplasty were discussed at length. The risks and benefits of total knee arthroplasty were presented and reviewed. The risks due to aseptic loosening, infection, stiffness, patella tracking problems, thromboembolic complications and other imponderables were discussed. The patient acknowledged the explanation, agreed to proceed with the plan and consent was signed. Patient is being admitted for inpatient treatment for surgery, pain control, PT, OT, prophylactic antibiotics, VTE prophylaxis, progressive ambulation and ADL's and discharge planning. The patient is planning to be discharged home with home health services

## 2015-05-26 ENCOUNTER — Other Ambulatory Visit (HOSPITAL_COMMUNITY): Payer: Self-pay | Admitting: *Deleted

## 2015-05-26 ENCOUNTER — Encounter (HOSPITAL_COMMUNITY): Payer: Self-pay

## 2015-05-26 ENCOUNTER — Encounter (HOSPITAL_COMMUNITY)
Admission: RE | Admit: 2015-05-26 | Discharge: 2015-05-26 | Disposition: A | Discharge status: Home / Self Care | Payer: Managed Care, Other (non HMO) | Source: Ambulatory Visit | Attending: Orthopedic Surgery | Admitting: Orthopedic Surgery

## 2015-05-26 ENCOUNTER — Other Ambulatory Visit: Payer: 59

## 2015-05-26 DIAGNOSIS — Z7902 Long term (current) use of antithrombotics/antiplatelets: Secondary | ICD-10-CM | POA: Diagnosis not present

## 2015-05-26 DIAGNOSIS — Z955 Presence of coronary angioplasty implant and graft: Secondary | ICD-10-CM | POA: Insufficient documentation

## 2015-05-26 DIAGNOSIS — E785 Hyperlipidemia, unspecified: Secondary | ICD-10-CM | POA: Insufficient documentation

## 2015-05-26 DIAGNOSIS — Z0183 Encounter for blood typing: Secondary | ICD-10-CM | POA: Insufficient documentation

## 2015-05-26 DIAGNOSIS — I2510 Atherosclerotic heart disease of native coronary artery without angina pectoris: Secondary | ICD-10-CM | POA: Diagnosis not present

## 2015-05-26 DIAGNOSIS — Z87891 Personal history of nicotine dependence: Secondary | ICD-10-CM | POA: Insufficient documentation

## 2015-05-26 DIAGNOSIS — I252 Old myocardial infarction: Secondary | ICD-10-CM | POA: Insufficient documentation

## 2015-05-26 DIAGNOSIS — M170 Bilateral primary osteoarthritis of knee: Secondary | ICD-10-CM | POA: Insufficient documentation

## 2015-05-26 DIAGNOSIS — Z79899 Other long term (current) drug therapy: Secondary | ICD-10-CM | POA: Diagnosis not present

## 2015-05-26 DIAGNOSIS — Z01818 Encounter for other preprocedural examination: Secondary | ICD-10-CM | POA: Diagnosis not present

## 2015-05-26 DIAGNOSIS — K219 Gastro-esophageal reflux disease without esophagitis: Secondary | ICD-10-CM | POA: Diagnosis not present

## 2015-05-26 DIAGNOSIS — D509 Iron deficiency anemia, unspecified: Secondary | ICD-10-CM | POA: Insufficient documentation

## 2015-05-26 DIAGNOSIS — Z01812 Encounter for preprocedural laboratory examination: Secondary | ICD-10-CM | POA: Insufficient documentation

## 2015-05-26 DIAGNOSIS — I10 Essential (primary) hypertension: Secondary | ICD-10-CM | POA: Insufficient documentation

## 2015-05-26 HISTORY — DX: Headache: R51

## 2015-05-26 HISTORY — DX: Headache, unspecified: R51.9

## 2015-05-26 LAB — CBC WITH DIFFERENTIAL/PLATELET
BASOS PCT: 1 %
Basophils Absolute: 0.1 10*3/uL (ref 0.0–0.1)
Eosinophils Absolute: 0.2 10*3/uL (ref 0.0–0.7)
Eosinophils Relative: 3 %
HEMATOCRIT: 40.5 % (ref 36.0–46.0)
HEMOGLOBIN: 12.9 g/dL (ref 12.0–15.0)
LYMPHS ABS: 2.1 10*3/uL (ref 0.7–4.0)
Lymphocytes Relative: 36 %
MCH: 29.5 pg (ref 26.0–34.0)
MCHC: 31.9 g/dL (ref 30.0–36.0)
MCV: 92.7 fL (ref 78.0–100.0)
MONO ABS: 0.3 10*3/uL (ref 0.1–1.0)
MONOS PCT: 6 %
NEUTROS ABS: 3.2 10*3/uL (ref 1.7–7.7)
NEUTROS PCT: 54 %
Platelets: 270 10*3/uL (ref 150–400)
RBC: 4.37 MIL/uL (ref 3.87–5.11)
RDW: 13.7 % (ref 11.5–15.5)
WBC: 5.8 10*3/uL (ref 4.0–10.5)

## 2015-05-26 LAB — TYPE AND SCREEN
ABO/RH(D): A POS
ANTIBODY SCREEN: NEGATIVE

## 2015-05-26 LAB — SURGICAL PCR SCREEN
MRSA, PCR: NEGATIVE
Staphylococcus aureus: NEGATIVE

## 2015-05-26 LAB — APTT: aPTT: 28 seconds (ref 24–37)

## 2015-05-26 LAB — ABO/RH: ABO/RH(D): A POS

## 2015-05-26 LAB — COMPREHENSIVE METABOLIC PANEL
ALBUMIN: 3.9 g/dL (ref 3.5–5.0)
ALK PHOS: 85 U/L (ref 38–126)
ALT: 19 U/L (ref 14–54)
ANION GAP: 12 (ref 5–15)
AST: 23 U/L (ref 15–41)
BILIRUBIN TOTAL: 0.3 mg/dL (ref 0.3–1.2)
BUN: 10 mg/dL (ref 6–20)
CALCIUM: 9.7 mg/dL (ref 8.9–10.3)
CO2: 27 mmol/L (ref 22–32)
Chloride: 103 mmol/L (ref 101–111)
Creatinine, Ser: 1.06 mg/dL — ABNORMAL HIGH (ref 0.44–1.00)
GFR, EST NON AFRICAN AMERICAN: 56 mL/min — AB (ref >60)
Glucose, Bld: 97 mg/dL (ref 65–99)
POTASSIUM: 4.5 mmol/L (ref 3.5–5.1)
Sodium: 142 mmol/L (ref 135–145)
TOTAL PROTEIN: 6.8 g/dL (ref 6.5–8.1)

## 2015-05-26 LAB — PROTIME-INR
INR: 1.02 (ref 0.00–1.49)
PROTHROMBIN TIME: 13.6 s (ref 11.6–15.2)

## 2015-05-26 NOTE — Progress Notes (Signed)
Call to Dr. Jamison Neighbor, requested fax from Card's office, Dr. Herbie Baltimore.  Its noted in the instructions to hold plavix for 5 days prior to surgery.

## 2015-05-26 NOTE — Progress Notes (Signed)
Pt. Told to hold plavix starting 06/03/2015, per Dr. Elissa Hefty report. Pt. Denies any chest concerns &/or flu like symptoms.  She has been on Tamiflu because her husband has recently had the flu. Pt. Takes last dose on 05/27/2015.

## 2015-05-26 NOTE — Pre-Procedure Instructions (Signed)
Jessica Landry  05/26/2015      [facility] [address] [place], [place] - [address] [place] [address] Phone: [phone] Fax: [phone]  [facility] - [name], [place] - [address] AT PORTAL TO REGISTERED [facility] SITES [address] [place] [address] Phone: [phone] Fax: [phone]  [facility] - [place], [place] - [address] [place] [address] Phone: [phone] Fax: [phone]  [facility] DELIVERY - [place], [place] - [address] [place] [address] Phone: [phone] Fax: [phone]  [facility] DELIVERY - [place], [place] - [address] [place] [address] Phone: [phone] Fax: [phone]    Your procedure is scheduled on 06/08/2015  Report to [facility] Admitting at 6:30 A.M.  Call this number if you have problems the morning of surgery:  [phone]   Remember:  Do not eat food or drink liquids after midnight.  On Tuesday night    Take these medicines the morning of surgery with A SIP OF WATER : Bupropion, Clonazepam- if needed, Fluoxetine, Metoprolol, Prilosec   Do not wear jewelry, make-up or nail polish.   Do not wear lotions, powders, or perfumes.  You may wear deodorant.   Do not shave 48 hours prior to surgery.     Do not bring valuables to the hospital.   [facility] is not responsible for any belongings or valuables.  Contacts, dentures or bridgework may not be worn into surgery.  Leave your suitcase in the car.  After surgery it may be brought to your room.  For patients admitted to the hospital, discharge time will be determined by your treatment team.  Patients discharged the day of surgery will not be allowed to drive home.   Name and phone number of your driver:   With spouse   Special instructions:  Special Instructions: [place]  - Preparing for Surgery  Before surgery, you can play an important role.  Because skin is not sterile, your skin needs to be as free of germs as possible.  You can reduce the number of germs on you skin by washing with CHG (chlorahexidine gluconate) soap before surgery.  CHG is an antiseptic cleaner which kills germs and bonds with the skin to continue killing germs even after washing.  Please DO NOT use if you have an allergy to CHG or antibacterial soaps.  If your skin becomes reddened/irritated stop using the CHG and inform your nurse when you arrive at Short Stay.  Do not shave (including legs and underarms) for at least 48 hours prior to the first CHG shower.  You may shave your face.  Please follow these instructions carefully:   1.  Shower with CHG Soap the night before surgery and the  morning of Surgery.  2.  If you choose to wash your hair, wash your hair first as usual with your  normal shampoo.  3.  After you shampoo, rinse your hair and body thoroughly to remove the  Shampoo.  4.  Use CHG as you would any other liquid soap.  You can apply chg directly to the skin and wash gently with scrungie or a clean washcloth.  5.  Apply the CHG Soap to your body ONLY FROM THE NECK DOWN.    Do not use on open wounds or open sores.  Avoid  contact with your eyes, ears, mouth and genitals (private parts).  Wash genitals (private parts)   with your normal soap.  6.  Wash thoroughly, paying special attention to the area where your surgery will be performed.  7.  Thoroughly rinse your body with warm water from the neck down.  8.  DO NOT shower/wash with your normal soap after using and rinsing off   the CHG Soap.  9.  Pat yourself dry with a clean towel.            10.  Wear clean pajamas.            11.  Place clean sheets on your bed the night of your first shower and do not sleep with pets.  Day of Surgery  Do not apply any lotions/deodorants the morning of surgery.  Please wear clean clothes to  the hospital/surgery center.  Please read over the following fact sheets that you were given. Pain Booklet, Coughing and Deep Breathing, Blood Transfusion Information, Total Joint Packet, MRSA Information and Surgical Site Infection Prevention

## 2015-05-27 ENCOUNTER — Encounter (HOSPITAL_COMMUNITY): Payer: Self-pay | Admitting: Emergency Medicine

## 2015-05-27 ENCOUNTER — Telehealth: Payer: Self-pay | Admitting: Cardiology

## 2015-05-27 LAB — URINE CULTURE

## 2015-05-27 NOTE — Progress Notes (Addendum)
Anesthesia Chart Review:  Pt is a 60 year old female scheduled for B total knee arthroplasty on 06/08/2015 with Dr. Jamison Neighbor.   Cardiologist is Dr. Bryan Lemma, last office visit 02/23/15. Pt has cardiac clearance from Dr. Herbie Baltimore on paper chart.   PMH includes:  CAD (STEMI 2012 -> PCI/DES of occluded mRCA), HTN, hyperlipidemia, iron deficiency anemia, GERD. Former smoker. BMI 26.  Medications include: benazepril, plavix, metoprolol, prilosec. Pt to stop plavix 06/03/15.   Preoperative labs reviewed.  Urine culture pending.   EKG 02/23/15: NSR. Nonspecific T wave abnormality.   Nuclear stress test 04/20/11:  - Fixed basal to mid inferior defect which may be artifact vs less likely scar. No reversible ischemia.  - Post stress EF 81%. No significant wall motion abnormalities.  - Exercise capacity 10 METS.  - This is a low risk scan.   Echo 03/27/11:  - LV cavity is small. Moderate concentric LVH. LV systolic function is normal. EF =>55%. Mild inferior wall hypokinesis - LA mildly dilated - RV systolic pressure normal - Small pericardial effusion - No significant valvular disease  Cardiac cath 03/07/11:  1. Inferior ST elevation MI, with a 100% mid RCA occlusion as the culprit lesion -reduced to 90% with wire passage. 2. Successful percutaneous coronary intervention of the mid RCA with A Promus DES 2.5 mm x 28 mm (postdilated proximally to 4.1 2 mm, and distal to 3.75 mm). 3. Preserved Left Ventricular Ejection Fraction - 55% with mild inferior hypokinesis.  Placed a call to Dr. Elissa Hefty office to get permission to stop plavix for 7 days instead of 5.  Awaiting return call.   If no changes, I anticipate pt can proceed with surgery as scheduled.   Rica Mast, FNP-BC [facility]/Anesthesiology Phone: [phone] 05/27/2015 12:46 PM  Addendum:   Dr. Herbie Baltimore has given ok for pt to stop plavix 7 days prior to surgery. Spoke with pt by phone to notify her.     Rica Mast, FNP-BC [facility]/Anesthesiology Phone: [phone] 05/30/2015 12:09 PM

## 2015-05-27 NOTE — Telephone Encounter (Signed)
Left msg - advised OK to hold 7 days per Dr. Herbie Baltimore.

## 2015-05-27 NOTE — Telephone Encounter (Signed)
Returned call. anasthesiologist was requesting 7 day hold for Plavix so that spinal route can be kept as option. Pt already cleared for 5 day hold. This is for procedure upcoming 1st of March.  Routing to Dr. Herbie Baltimore.

## 2015-05-27 NOTE — Telephone Encounter (Signed)
5-7 days is OK.  DH

## 2015-05-27 NOTE — Telephone Encounter (Signed)
Jessica Landry is calling to see if [name] can stop the Plavix for 7 days versus 5 days ( Already cleared to stop for 5days) . Please call   Thanks

## 2015-06-07 ENCOUNTER — Ambulatory Visit (INDEPENDENT_AMBULATORY_CARE_PROVIDER_SITE_OTHER): Payer: Managed Care, Other (non HMO) | Admitting: Psychiatry

## 2015-06-07 ENCOUNTER — Encounter: Payer: Self-pay | Admitting: Psychiatry

## 2015-06-07 DIAGNOSIS — F339 Major depressive disorder, recurrent, unspecified: Secondary | ICD-10-CM

## 2015-06-07 DIAGNOSIS — F329 Major depressive disorder, single episode, unspecified: Secondary | ICD-10-CM | POA: Insufficient documentation

## 2015-06-07 DIAGNOSIS — F32A Depression, unspecified: Secondary | ICD-10-CM | POA: Insufficient documentation

## 2015-06-07 MED ORDER — FLUOXETINE HCL 10 MG PO CAPS: 10.0000 mg | ORAL_CAPSULE | Freq: Two times a day (BID) | ORAL | Status: DC

## 2015-06-07 MED ORDER — TRANEXAMIC ACID 1000 MG/10ML IV SOLN
1000.0000 mg | INTRAVENOUS | Status: DC
  Filled 2015-06-07: qty 10

## 2015-06-07 NOTE — Progress Notes (Signed)
Patient ID: Jessica Landry, female   DOB: 1956/02/03, 60 y.o.   MRN: [id number] [facility] MD/PA/NP OP Progress Note  06/07/2015 9:03 AM Jessica Landry  MRN:  [id number]  Subjective:  Patient returns for follow-up of her major depressive disorder, recurrent moderate and bereavement with her husband.  Patient was previously seen by Dr. Mayford Knife and this is the first visit for this patient with this physician. During her previous visit patient was tapered off Effexor and was started on the plan to titrate Prozac up to 20 mg along with the Wellbutrin XL 300 mg. Patient reports beng able to control her weeping and not as much on edge. Feeling cognitively better, not foggy as before, not having involuntary muscle jerks like before. States her memory is better. States she is however starts getting sad around 7:30, becomes very emotional. She is having hot flashes. She is anxious about upcoming knee replacement surgery tomorrow. Overall reports that she has been doing much better and enjoying her life. Has been also reports that he seeing the old Toi and that she has improved quite a bit.    Chief Complaint: Depression Chief Complaint    Follow-up; Medication Refill     Visit Diagnosis:   No diagnosis found.  Past Medical History:  Past Medical History  Diagnosis Date  . ST-segment elevation myocardial infarction (STEMI) of inferior wall (HCC) 03/07/2011    RCA Occlusion --> PCI of mRCA; ECHO 03/2011: EF > 55%, MIld Inferior HK, Mild AoV Sclerosis.  Marland Kitchen CAD S/P percutaneous coronary angioplasty 03/07/2011    a) PCI of mRCA with Promus Element DES 3.5 mm x 28 mm (4.2 -- 3.7 mm); b) Myoview 04/2011: EF 81% (small LV Cavity), fixed basal-mid Inferior Infarct, No Ischemia, 10 METS  . Essential hypertension 03/07/2011  . Hyperlipidemia with target LDL less than 70 2012     Statin Intolerance (tried Lipitor & Crestor)  . History of back surgery   . GERD (gastroesophageal reflux disease)   . Anxiety   .  Iron deficiency anemia 03/09/2011  . Insomnia     without significant sleeping disorders  . Neuropathy (HCC)   . Arthritis   . Vocal cord polyp 2005  . Depression 03/08/2011    Dr. Leory Plowman- [place], [facility]  . Headache     chocolate & red wine triggers /w bad headaches     Past Surgical History  Procedure Laterality Date  . Abdominal hysterectomy    . Doppler echocardiography  03/27/2011    LV cavity is small,EF =>55%,MILD INFERIOR HYPOKINESIS; Mild Aortic Sclerosis  . Nm myocar perf wall motion  04/20/2011    EF 81% (due to small LVcavity),fixed basal to mid inferior INFARCT - NO ISCHEMIA; 10 METs  . Coronary angioplasty with stent placement  03/07/2011    inferior STEMI - RCA PROMUS 3.5 mm x 28 mm DES  (post Dil 3.7 distal to 4.2 prox)  . Cardiac catheterization  03/08/2011    improved flow from the PCI  . Left heart catheterization with coronary angiogram N/A 03/07/2011    Procedure: LEFT HEART CATHETERIZATION WITH CORONARY ANGIOGRAM;  Surgeon: Marykay Lex, MD;  Location: [facility] CATH LAB;  Service: Cardiovascular;  Laterality: N/A;  . Left heart catheterization with coronary angiogram N/A 03/08/2011    Procedure: LEFT HEART CATHETERIZATION WITH CORONARY ANGIOGRAM;  Surgeon: Marykay Lex, MD;  Location: [facility] CATH LAB;  Service: Cardiovascular;  Laterality: N/A;  . Knee surgery Left 2015  . Knee surgery Right 2010  Baker cyst  . Dental surgery  2016  . Upper gi endoscopy  02-22-03    Dr. Lemar Livings, normal  . Breast biopsy Right 01/31/1999    fibrocystic changes, ductal adenosis, focal atypical ductal epithelium.  . Cholecystectomy  03/25/1998    Dr. Lemar Livings, chronic cholecystitis.  . Skin nodule Left 09/11/1999    dermatofibroma left lower leg, positive lateral margin.  Marland Kitchen Appendectomy  07/04/2000    Normal appendix, adhesions noted.  . Colonoscopy with propofol N/A 09/28/2014    Procedure: COLONOSCOPY WITH PROPOFOL;  Surgeon: Earline Mayotte, MD;  Location: [facility] ENDOSCOPY;   Service: Endoscopy;  Laterality: N/A;  . Esophagogastroduodenoscopy N/A 09/28/2014    Procedure: ESOPHAGOGASTRODUODENOSCOPY (EGD);  Surgeon: Earline Mayotte, MD;  Location: [facility] ENDOSCOPY;  Service: Endoscopy;  Laterality: N/A;  . Back surgery      cyst on spinal cord - lumbar  . Eye surgery      blepheroplasty- bilateral   Family History:  Family History  Problem Relation Age of Onset  . Heart attack Mother   . Coronary artery disease Mother   . Stroke Mother   . Depression Mother   . Anxiety disorder Mother   . Alzheimer's disease Father   . Stroke Father   . Breast cancer Sister 83  . Depression Brother   . Drug abuse Brother   . Bipolar disorder Sister    Social History:  Social History   Social History  . Marital Status: Married    Spouse Name: N/A  . Number of Children: N/A  . Years of Education: N/A   Social History Main Topics  . Smoking status: Former Smoker -- 0.25 packs/day for 40 years    Types: Cigarettes    Quit date: 03/07/2011  . Smokeless tobacco: Never Used  . Alcohol Use: No  . Drug Use: No  . Sexual Activity: Yes   Other Topics Concern  . None   Social History Narrative   Divorced woman -- Now Remarried to her long-term partner.   Exercises routinely on a daily basis roughly 45 minutes a day walking.  Quit smoking at the time of her MI. Does not drink.   Additional History:   Assessment:   Musculoskeletal: Strength & Muscle Tone: within normal limits Gait & Station: normal Patient leans: N/A  Psychiatric Specialty Exam: Depression        Associated symptoms include does not have insomnia and no suicidal ideas.   Review of Systems  Psychiatric/Behavioral: Positive for depression. Negative for suicidal ideas, hallucinations, memory loss and substance abuse. The patient is not nervous/anxious and does not have insomnia.   All other systems reviewed and are negative.   Blood pressure 110/70, pulse 96, temperature 97.7 F (36.5 C),  temperature source Tympanic, height  (1.676 m), weight 161 lb 9.6 oz (73.301 kg), SpO2 99 %.Body mass index is 26.1 kg/(m^2).  General Appearance: Neat and Well Groomed  Eye Contact:  Good  Speech:  Normal Rate  Volume:  Normal  Mood:  Much improved   Affect:  Smiling   Thought Process:  Linear  Orientation:  Full (Time, Place, and Person)  Thought Content:  Negative  Suicidal Thoughts:  No  Homicidal Thoughts:  No  Memory:  Immediate;   Good Recent;   Good Remote;   Good  Judgement:  Good  Insight:  Good  Psychomotor Activity:  Negative  Concentration:  Good  Recall:  Good  Fund of Knowledge: Negative  Language: Good  Akathisia:  Negative  Handed:  Right unknown   AIMS (if indicated):  NA  Assets:  Communication Skills Desire for Improvement  ADL's:  Intact  Cognition: WNL  Sleep:  good   Is the patient at risk to self?  No. Has the patient been a risk to self in the past 6 months?  No. Has the patient been a risk to self within the distant past?  No. Is the patient a risk to others?  No. Has the patient been a risk to others in the past 6 months?  No. Has the patient been a risk to others within the distant past?  No.  Current Medications: Current Outpatient Prescriptions  Medication Sig Dispense Refill  . benazepril (LOTENSIN) 5 MG tablet Take 1 tablet (5 mg total) by mouth daily. 90 tablet 3  . buPROPion (WELLBUTRIN XL) 300 MG 24 hr tablet Take 1 tablet (300 mg total) by mouth daily. 30 tablet 4  . clonazePAM (KLONOPIN) 0.5 MG tablet Take 1 tablet (0.5 mg total) by mouth 2 (two) times daily as needed for anxiety. 60 tablet 0  . clopidogrel (PLAVIX) 75 MG tablet Take 1 tablet (75 mg total) by mouth daily. 90 tablet 3  . FLUoxetine (PROZAC) 10 MG capsule Take one tablet in the morning for seven days then increase to two tablets in the morning. Start the day after off Effexor XR. (Patient taking differently: Take 10 mg by mouth daily. ) 60 capsule 1  . gabapentin  (NEURONTIN) 300 MG capsule TAKE 2 CAPSULES EVERY NIGHT 180 capsule 2  . metoprolol (LOPRESSOR) 50 MG tablet Take 1 tablet (50 mg total) by mouth 2 (two) times daily. 180 tablet 3  . nitroGLYCERIN (NITROSTAT) 0.4 MG SL tablet Place 1 tablet (0.4 mg total) under the tongue every 5 (five) minutes as needed for chest pain (up to 3 tabs in 15 mins and then call 911). 25 tablet 1  . omeprazole (PRILOSEC OTC) 20 MG tablet Take 20 mg by mouth daily.      Marland Kitchen oseltamivir (TAMIFLU) 75 MG capsule Take 1 capsule (75 mg total) by mouth daily. 7 capsule 0   No current facility-administered medications for this visit.    Medical Decision Making:  Established Problem, Stable/Improving (1) and Review of Medication Regimen & Side Effects (2)  Treatment Plan Summary:Medication management and Plan   Major depressive disorder, recurrent, moderate- We will continue Wellbutrin XL 300 mg daily. And benefits have been discussed and patient is able to consent. Change Prozac dosing to  bid. Discontinue Klonopin since patient has not been taking it.  Counselled on different ways to tweak medications, will wait until patient is recovered from surgery.  Bereavement-patient is involved in grief counseling.  Patient will hollow up in 1 month.   Robyn Galati 06/07/2015, 9:03 AM

## 2015-06-08 ENCOUNTER — Inpatient Hospital Stay (HOSPITAL_COMMUNITY): Admission: RE | Admit: 2015-06-08 | Payer: 59 | Source: Ambulatory Visit | Admitting: Orthopedic Surgery

## 2015-06-08 ENCOUNTER — Encounter (HOSPITAL_COMMUNITY): Admission: RE | Payer: Self-pay | Source: Ambulatory Visit

## 2015-06-08 SURGERY — ARTHROPLASTY, KNEE, BILATERAL, TOTAL
Anesthesia: General | Laterality: Bilateral

## 2015-07-05 ENCOUNTER — Ambulatory Visit (INDEPENDENT_AMBULATORY_CARE_PROVIDER_SITE_OTHER): Payer: Managed Care, Other (non HMO) | Admitting: Psychiatry

## 2015-07-05 ENCOUNTER — Encounter: Payer: Self-pay | Admitting: Psychiatry

## 2015-07-05 VITALS — BP 122/72 | HR 70 | Temp 97.0°F | Wt 165.8 lb

## 2015-07-05 DIAGNOSIS — Z634 Disappearance and death of family member: Secondary | ICD-10-CM | POA: Diagnosis not present

## 2015-07-05 DIAGNOSIS — F331 Major depressive disorder, recurrent, moderate: Secondary | ICD-10-CM

## 2015-07-05 MED ORDER — FLUOXETINE HCL 20 MG PO CAPS: 20.0000 mg | ORAL_CAPSULE | Freq: Two times a day (BID) | ORAL | Status: DC

## 2015-07-05 NOTE — Progress Notes (Signed)
Patient ID: Jessica Landry, female   DOB: 04-Aug-1955, 60 y.o.   MRN: [id number]  [facility] MD/PA/NP OP Progress Note  07/05/2015 9:17 AM Jessica Landry  MRN:  [id number]  Subjective:  Patient returns for follow-up of her major depressive disorder, recurrent moderate and bereavement with her husband. Patient reports doing much better overall mood wise. States she is not on edge as much. She started the Prozac at  in the morning and  at 4 pm. States this has really helped with her evening symptoms. Feeling cognitively better, not foggy as before, not having involuntary muscle jerks like before. States her memory is better. States she is however starts getting sad around 7:30, becomes very emotional. Her hot flashes have subsided. She reports that she did not have a knee surgery as planned because of some issues with her health coverage. States that she is a bit distressed at that however realizes that these kinds of things happen. She is feeling much better overall.  Chief Complaint: Doing better Chief Complaint    Follow-up; Medication Refill     Visit Diagnosis:   No diagnosis found.  Past Medical History:  Past Medical History  Diagnosis Date  . ST-segment elevation myocardial infarction (STEMI) of inferior wall (HCC) 03/07/2011    RCA Occlusion --> PCI of mRCA; ECHO 03/2011: EF > 55%, MIld Inferior HK, Mild AoV Sclerosis.  Marland Kitchen CAD S/P percutaneous coronary angioplasty 03/07/2011    a) PCI of mRCA with Promus Element DES 3.5 mm x 28 mm (4.2 -- 3.7 mm); b) Myoview 04/2011: EF 81% (small LV Cavity), fixed basal-mid Inferior Infarct, No Ischemia, 10 METS  . Essential hypertension 03/07/2011  . Hyperlipidemia with target LDL less than 70 2012     Statin Intolerance (tried Lipitor & Crestor)  . History of back surgery   . GERD (gastroesophageal reflux disease)   . Anxiety   . Iron deficiency anemia 03/09/2011  . Insomnia     without significant sleeping disorders  . Neuropathy (HCC)   .  Arthritis   . Vocal cord polyp 2005  . Depression 03/08/2011    Dr. Leory Plowman- [place], [facility]  . Headache     chocolate & red wine triggers /w bad headaches     Past Surgical History  Procedure Laterality Date  . Abdominal hysterectomy    . Doppler echocardiography  03/27/2011    LV cavity is small,EF =>55%,MILD INFERIOR HYPOKINESIS; Mild Aortic Sclerosis  . Nm myocar perf wall motion  04/20/2011    EF 81% (due to small LVcavity),fixed basal to mid inferior INFARCT - NO ISCHEMIA; 10 METs  . Coronary angioplasty with stent placement  03/07/2011    inferior STEMI - RCA PROMUS 3.5 mm x 28 mm DES  (post Dil 3.7 distal to 4.2 prox)  . Cardiac catheterization  03/08/2011    improved flow from the PCI  . Left heart catheterization with coronary angiogram N/A 03/07/2011    Procedure: LEFT HEART CATHETERIZATION WITH CORONARY ANGIOGRAM;  Surgeon: Marykay Lex, MD;  Location: [facility] CATH LAB;  Service: Cardiovascular;  Laterality: N/A;  . Left heart catheterization with coronary angiogram N/A 03/08/2011    Procedure: LEFT HEART CATHETERIZATION WITH CORONARY ANGIOGRAM;  Surgeon: Marykay Lex, MD;  Location: [facility] CATH LAB;  Service: Cardiovascular;  Laterality: N/A;  . Knee surgery Left 2015  . Knee surgery Right 2010    Baker cyst  . Dental surgery  2016  . Upper gi endoscopy  02-22-03    Dr.  Byrnett, normal  . Breast biopsy Right 01/31/1999    fibrocystic changes, ductal adenosis, focal atypical ductal epithelium.  . Cholecystectomy  03/25/1998    Dr. Lemar LivingsByrnett, chronic cholecystitis.  . Skin nodule Left 09/11/1999    dermatofibroma left lower leg, positive lateral margin.  Marland Kitchen. Appendectomy  07/04/2000    Normal appendix, adhesions noted.  . Colonoscopy with propofol N/A 09/28/2014    Procedure: COLONOSCOPY WITH PROPOFOL;  Surgeon: Earline MayotteJeffrey W Byrnett, MD;  [facility];  Service: Endoscopy;  Laterality: N/A;  . Esophagogastroduodenoscopy N/A 09/28/2014    Procedure:  ESOPHAGOGASTRODUODENOSCOPY (EGD);  Surgeon: Earline MayotteJeffrey W Byrnett, MD;  [facility] END[facility]COPY;  Service: Endoscopy;  Laterality: N/A;  . Back surgery      cyst on spinal cord - lumbar  . Eye surgery      blepheroplasty- bilateral   Family History:  Family History  Problem Relation Age of Onset  . Heart attack Mother   . Coronary artery disease Mother   . Stroke Mother   . Depression Mother   . Anxiety disorder Mother   . Alzheimer's disease Father   . Stroke Father   . Breast cancer Sister [id number]  . Depression Brother   . Drug abuse Brother   . Bipolar disorder Sister    Social History:  Social History   Social History  . Marital Status: Married    Spouse Name: N/A  . Number of Children: N/A  . Years of Education: N/A   Social History Main Topics  . Smoking status: Former Smoker -- 0.25 packs/day for 40 years    Types: Cigarettes    Quit date: 03/07/2011  . Smokeless tobacco: Never Used  . Alcohol Use: No  . Drug Use: No  . Sexual Activity: Yes   Other Topics Concern  . None   Social History Narrative   Divorced woman -- Now Remarried to her long-term partner.   Exercises routinely on a daily basis roughly 45 minutes a day walking.  Quit smoking at the time of her MI. Does not drink.   Additional History:   Assessment:   Musculoskeletal: Strength & Muscle Tone: within normal limits Gait & Station: normal Patient leans: N/A  Psychiatric Specialty Exam: Depression        Associated symptoms include does not have insomnia and no suicidal ideas.   Review of Systems  Psychiatric/Behavioral: Positive for depression. Negative for suicidal ideas, hallucinations, memory loss and substance abuse. The patient is not nervous/anxious and does not have insomnia.   All other systems reviewed and are negative.   Blood pressure 122/72, pulse 70, temperature 97 F (36.1 C), temperature source Tympanic, weight 165 lb 12.8 oz (75.206 kg), SpO2 98 %.Body mass index is 26.77  kg/(m^2).  General Appearance: Neat and Well Groomed  Eye Contact:  Good  Speech:  Normal Rate  Volume:  Normal  Mood:  Much improved   Affect:  Smiling   Thought Process:  Linear  Orientation:  Full (Time, Place, and Person)  Thought Content:  Negative  Suicidal Thoughts:  No  Homicidal Thoughts:  No  Memory:  Immediate;   Good Recent;   Good Remote;   Good  Judgement:  Good  Insight:  Good  Psychomotor Activity:  Negative  Concentration:  Good  Recall:  Good  Fund of Knowledge: Negative  Language: Good  Akathisia:  Negative  Handed:  Right unknown   AIMS (if indicated):  NA  Assets:  Communication Skills Desire for Improvement  ADL's:  Intact  Cognition: WNL  Sleep:  good   Is the patient at risk to self?  No. Has the patient been a risk to self in the past 6 months?  No. Has the patient been a risk to self within the distant past?  No. Is the patient a risk to others?  No. Has the patient been a risk to others in the past 6 months?  No. Has the patient been a risk to others within the distant past?  No.  Current Medications: Current Outpatient Prescriptions  Medication Sig Dispense Refill  . benazepril (LOTENSIN) 5 MG tablet Take 1 tablet (5 mg total) by mouth daily. 90 tablet 3  . buPROPion (WELLBUTRIN XL) 300 MG 24 hr tablet Take 1 tablet (300 mg total) by mouth daily. 30 tablet 4  . clopidogrel (PLAVIX) 300 MG TABS tablet Take 300 mg by mouth once.    Marland Kitchen FLUoxetine (PROZAC) 10 MG capsule Take 1 capsule (10 mg total) by mouth 2 (two) times daily. 60 capsule 1  . gabapentin (NEURONTIN) 300 MG capsule TAKE 2 CAPSULES EVERY NIGHT 180 capsule 2  . HYDROcodone-acetaminophen (NORCO/VICODIN) 5-325 MG tablet Take 1 tablet by mouth 2 (two) times daily as needed.  0  . metoprolol (LOPRESSOR) 50 MG tablet Take 1 tablet (50 mg total) by mouth 2 (two) times daily. 180 tablet 3  . nitroGLYCERIN (NITROSTAT) 0.4 MG SL tablet Place 1 tablet (0.4 mg total) under the tongue every 5  (five) minutes as needed for chest pain (up to 3 tabs in 15 mins and then call 911). 25 tablet 1  . omeprazole (PRILOSEC OTC) 20 MG tablet Take 20 mg by mouth daily.      . traMADol (ULTRAM) 50 MG tablet      No current facility-administered medications for this visit.    Medical Decision Making:  Established Problem, Stable/Improving (1) and Review of Medication Regimen & Side Effects (2)  Treatment Plan Summary:Medication management and Plan   Major depressive disorder, recurrent, moderate- We will continue Wellbutrin XL 300 mg daily. And benefits have been discussed and patient is able to consent. Continue Prozac at  in the morning  and increase the evening dose to .  Patient educated about serotonin syndrome with interactions between Prozac and tramadol. Patient stated that she takes tramadol once in a while and she realizes that she could have interactions and will watch out for it.   Bereavement-patient is involved in grief counseling.  Patient will hollow up in 1 month.   Jessica Landry 07/05/2015, 9:17 AM

## 2015-07-08 ENCOUNTER — Encounter (HOSPITAL_COMMUNITY)
Admission: RE | Admit: 2015-07-08 | Discharge: 2015-07-08 | Disposition: A | Discharge status: Home / Self Care | Payer: Managed Care, Other (non HMO) | Source: Ambulatory Visit | Attending: Orthopedic Surgery | Admitting: Orthopedic Surgery

## 2015-07-08 ENCOUNTER — Other Ambulatory Visit (HOSPITAL_COMMUNITY): Payer: Self-pay | Admitting: *Deleted

## 2015-07-08 ENCOUNTER — Encounter (HOSPITAL_COMMUNITY): Payer: Self-pay

## 2015-07-08 DIAGNOSIS — Z01812 Encounter for preprocedural laboratory examination: Secondary | ICD-10-CM | POA: Diagnosis not present

## 2015-07-08 DIAGNOSIS — M170 Bilateral primary osteoarthritis of knee: Secondary | ICD-10-CM | POA: Insufficient documentation

## 2015-07-08 HISTORY — DX: Family history of other specified conditions: Z84.89

## 2015-07-08 HISTORY — DX: Adverse effect of unspecified anesthetic, initial encounter: T41.45XA

## 2015-07-08 HISTORY — DX: Other complications of anesthesia, initial encounter: T88.59XA

## 2015-07-08 HISTORY — DX: Nausea with vomiting, unspecified: R11.2

## 2015-07-08 HISTORY — DX: Nausea with vomiting, unspecified: Z98.890

## 2015-07-08 LAB — SURGICAL PCR SCREEN
MRSA, PCR: NEGATIVE
Staphylococcus aureus: NEGATIVE

## 2015-07-08 LAB — BASIC METABOLIC PANEL WITH GFR
Anion gap: 8 (ref 5–15)
BUN: 12 mg/dL (ref 6–20)
CO2: 27 mmol/L (ref 22–32)
Calcium: 9.3 mg/dL (ref 8.9–10.3)
Chloride: 106 mmol/L (ref 101–111)
Creatinine, Ser: 1.04 mg/dL — ABNORMAL HIGH (ref 0.44–1.00)
GFR calc Af Amer: >60 mL/min
GFR calc non Af Amer: 57 mL/min — ABNORMAL LOW
Glucose, Bld: 90 mg/dL (ref 65–99)
Potassium: 4.4 mmol/L (ref 3.5–5.1)
Sodium: 141 mmol/L (ref 135–145)

## 2015-07-08 LAB — CBC
HCT: 37.7 % (ref 36.0–46.0)
Hemoglobin: 12.3 g/dL (ref 12.0–15.0)
MCH: 30 pg (ref 26.0–34.0)
MCHC: 32.6 g/dL (ref 30.0–36.0)
MCV: 92 fL (ref 78.0–100.0)
Platelets: 256 K/uL (ref 150–400)
RBC: 4.1 MIL/uL (ref 3.87–5.11)
RDW: 13.5 % (ref 11.5–15.5)
WBC: 5.2 K/uL (ref 4.0–10.5)

## 2015-07-08 NOTE — Progress Notes (Signed)
Call to Dr. Algis Downs. Murphy's office, spoke with Clydie BraunKaren, she will get message to [facility]. Dub MikesStanbery, PA that we need a new order for surg. Consent & if they still want T&S then that needs to be reordered as well.

## 2015-07-08 NOTE — Progress Notes (Signed)
Pt. Was seen by me a mth + ago, the chart was also reviewed by Anesth. Dept. But surg. Was reschedule due to insurance stop situation. She has now attended the joint replacement class. Plavix stop date is based on note from Anesth. Referencing last surgery.

## 2015-07-08 NOTE — Pre-Procedure Instructions (Signed)
Jessica BrookingSarah A Landry  07/08/2015      [facility] [address]- [place], [place] - [address] [place] [address] Phone: [phone] Fax: [phone]  [facility] [place], [place] - [id number] AT PORTAL TO [facility] [address] [place] [id number] Phone: [phone] Fax: [phone]  [facility] [place], [place] - [address] [place] [address] Phone: [phone] Fax: [phone]  [facility] [place] - [address] [place] [id number] Phone: [phone] Fax: [phone]    Your procedure is scheduled on 07/20/2015  [facility]00 [facility].M.  Call this number if you have problems the morning of surgery:  [phone]   Remember:  Do not eat food or drink liquids after midnight.  On Tuesday  Take these medicines the morning of surgery with A SIP OF WATER : Bupropion, Fluoxetine, Metoprolol, Prilosec              Take LAST DOSE of PLAVIX on 07/13/2015   Do not wear jewelry, make-up or nail polish.  Do not wear lotions, powders, or perfumes.  You may wear deodorant.  Do not shave 48 hours prior to surgery.   Do not bring valuables to the hospital.  [facility] for any belongings or valuables.  Contacts, dentures or bridgework may not be worn into surgery.  Leave your suitcase in the car.  After surgery it may be brought to your room.  For patients admitted to the hospital, discharge time will be determined by your treatment team.  Patients discharged the day of surgery will not be allowed to drive home.   Name and phone number of your driver:   With spouse   Special instructions: Special Instructions: [place] - Preparing for Surgery  Before surgery, you can play an important role.  Because skin is not sterile, your skin needs to be as free of germs  as possible.  You can reduce the number of germs on you skin by washing with CHG (chlorahexidine gluconate) soap before surgery.  CHG is an antiseptic cleaner which kills germs and bonds with the skin to continue killing germs even after washing.  Please DO NOT use if you have an allergy to CHG or antibacterial soaps.  If your skin becomes reddened/irritated stop using the CHG and inform your nurse when you arrive at Short Stay.  Do not shave (including legs and underarms) for at least 48 hours prior to the first CHG shower.  You may shave your face.  Please follow these instructions carefully:   1.  Shower with CHG Soap the night before surgery and the  morning of Surgery.  2.  If you choose to wash your hair, wash your hair first as usual with your  normal shampoo.  3.  After you shampoo, rinse your hair and body thoroughly to remove the  Shampoo.  4.  Use CHG as you would any other liquid soap.  You can apply chg directly to the skin and wash gently with scrungie or a clean washcloth.  5.  Apply the CHG Soap to your body ONLY FROM THE NECK DOWN.    Do not use on open wounds or open sores.  Avoid contact with your eyes, ears, mouth and genitals (private parts).  Wash genitals (private parts)  with your normal soap.  6.  Wash thoroughly, paying special attention to the area where your surgery will be performed.  7.  Thoroughly rinse your body with warm water from the neck down.  8.  DO NOT shower/wash with your normal soap after using and rinsing off   the CHG Soap.  9.  Pat yourself dry with a clean towel.            10.  Wear clean pajamas.            11.  Place clean sheets on your bed the night of your first shower and do not sleep with pets.  Day of Surgery  Do not apply any lotions/deodorants the morning of surgery.  Please wear clean clothes to the hospital/surgery center.  Please read over the following fact sheets that you were given. Pain Booklet, MRSA Information and Surgical  Site Infection Prevention

## 2015-07-11 ENCOUNTER — Other Ambulatory Visit: Payer: Self-pay | Admitting: Physician Assistant

## 2015-07-11 NOTE — H&P (Signed)
  HPI: Jessica Landry is a 60 year old female with a history of bilateral end stage degenerative joint disease and chronic pain. She is here for a follow-up. Symptoms are unchanged from her previous visit. She wishes to proceed with bilateral total knee replacements as scheduled. She has significant pain with activities affecting her quality of life. She is unable to perform activities of daily living. She has failed conservative treatment options including cortisone and viscosupplementation injections.  Medications: Fluoxetine. Bupropion, Prilosec, Plavix, Metoprolol, Gabapentin and Benazepril.  ALLERGIES:  SULFA, BEE STINGS. Past medical history:  Glasses, dentures, hypertension, anemia, constipation, dizziness, headaches, anxiety, weight gain but appetite loss. Past surgical history: Hysterectomy, breast biopsy with lump removal, appendectomy, vocal cord polyp, cyst on the right knee, Stent placement in 2012, left knee arthroscopy, blepharoplasty.  Family history: Heart attack mother. Hypertension mother.  Stroke mother and father.  Social History: She is married and lives with her husband. She has a 37 year pack history. She is a nondrinker.  Review of systems:  Denies any light headedness, dizziness, fevers, chills, chest pain, palpations or shortness of breath.    EXAMINATION: Height:  5\' 4"   Weight:  160 pounds. Blood pressure: 126/78 Oxygen Saturation: 100% on room air. Pulse 70.  Temperature: 97.7 degrees.   Well-developed, well-nourished female in no acute distress.  Alert and oriented x 3.   She has an antalgic gait both sides. PERRLA . Neck unremarkable. Lungs clear to auscultation bilaterally. No wheezes, rales or rhonchi. Heart regular rate and rhythm, no murmur. Abdomen is soft and nontender. Active bowel sounds times four. Calves nontender bilaterally. Neurovascularly  grossly intact in the bilateral upper and lower extremities. Skin is warm and dry. Examination of both knees reveal an antalgic  gait with valgus thrush, right greater than left. Negative straight leg raise. Negative log roll both sides.  Her range of motion is 0-125 degrees with 15 or more degrees of valgus which is partially correctable on the left. Motion is 130 with increasing valgus but not as extreme. Significant patellofemoral and tibiofemoral crepitus. Neurovascularly intact distally.  X-RAYS: Previous imaging of both knees demonstrates severe endstage degenerative joint changes.   IMPRESSION: Bilateral knee endstage degenerative joint changes with chronic pain with failure to improve with conservative treatment options.  PLAN: We will proceed with bilateral total knee replacements as scheduled. We discussed the risks,benefits and possible complications of surgery. The rehab and recovery time discussed. All questions were encouraged and answered. He will need home health at the time of discharge.

## 2015-07-19 MED ORDER — LACTATED RINGERS IV SOLN
INTRAVENOUS | Status: DC
  Administered 2015-07-20 (×3): via INTRAVENOUS

## 2015-07-19 MED ORDER — TRANEXAMIC ACID 1000 MG/10ML IV SOLN
1000.0000 mg | INTRAVENOUS | Status: AC
  Administered 2015-07-20: 1000 mg via INTRAVENOUS
  Filled 2015-07-19: qty 10

## 2015-07-19 MED ORDER — CEFAZOLIN SODIUM-DEXTROSE 2-4 GM/100ML-% IV SOLN
2.0000 g | INTRAVENOUS | Status: AC
  Administered 2015-07-20: 2 g via INTRAVENOUS
  Filled 2015-07-19: qty 100

## 2015-07-20 ENCOUNTER — Inpatient Hospital Stay (HOSPITAL_COMMUNITY)
Admission: RE | Admit: 2015-07-20 | Discharge: 2015-07-24 | DRG: 462 | Disposition: A | Discharge status: Home Health | Payer: Managed Care, Other (non HMO) | Source: Ambulatory Visit | Attending: Orthopedic Surgery | Admitting: Orthopedic Surgery

## 2015-07-20 ENCOUNTER — Inpatient Hospital Stay (HOSPITAL_COMMUNITY): Payer: Managed Care, Other (non HMO)

## 2015-07-20 ENCOUNTER — Encounter (HOSPITAL_COMMUNITY)
Admission: RE | Disposition: A | Discharge status: Home Health | Payer: Self-pay | Source: Ambulatory Visit | Attending: Orthopedic Surgery

## 2015-07-20 ENCOUNTER — Inpatient Hospital Stay (HOSPITAL_COMMUNITY): Payer: Managed Care, Other (non HMO) | Admitting: Anesthesiology

## 2015-07-20 ENCOUNTER — Encounter (HOSPITAL_COMMUNITY): Payer: Self-pay | Admitting: *Deleted

## 2015-07-20 DIAGNOSIS — E785 Hyperlipidemia, unspecified: Secondary | ICD-10-CM | POA: Diagnosis present

## 2015-07-20 DIAGNOSIS — Z90710 Acquired absence of both cervix and uterus: Secondary | ICD-10-CM

## 2015-07-20 DIAGNOSIS — K219 Gastro-esophageal reflux disease without esophagitis: Secondary | ICD-10-CM | POA: Diagnosis present

## 2015-07-20 DIAGNOSIS — I2510 Atherosclerotic heart disease of native coronary artery without angina pectoris: Secondary | ICD-10-CM | POA: Diagnosis present

## 2015-07-20 DIAGNOSIS — Z8249 Family history of ischemic heart disease and other diseases of the circulatory system: Secondary | ICD-10-CM | POA: Diagnosis not present

## 2015-07-20 DIAGNOSIS — G8929 Other chronic pain: Secondary | ICD-10-CM | POA: Diagnosis present

## 2015-07-20 DIAGNOSIS — Z882 Allergy status to sulfonamides status: Secondary | ICD-10-CM

## 2015-07-20 DIAGNOSIS — Z96659 Presence of unspecified artificial knee joint: Secondary | ICD-10-CM

## 2015-07-20 DIAGNOSIS — Z823 Family history of stroke: Secondary | ICD-10-CM | POA: Diagnosis not present

## 2015-07-20 DIAGNOSIS — Z91030 Bee allergy status: Secondary | ICD-10-CM

## 2015-07-20 DIAGNOSIS — Z79899 Other long term (current) drug therapy: Secondary | ICD-10-CM | POA: Diagnosis not present

## 2015-07-20 DIAGNOSIS — M170 Bilateral primary osteoarthritis of knee: Secondary | ICD-10-CM | POA: Diagnosis present

## 2015-07-20 DIAGNOSIS — I252 Old myocardial infarction: Secondary | ICD-10-CM

## 2015-07-20 DIAGNOSIS — Z955 Presence of coronary angioplasty implant and graft: Secondary | ICD-10-CM | POA: Diagnosis not present

## 2015-07-20 DIAGNOSIS — Z96653 Presence of artificial knee joint, bilateral: Secondary | ICD-10-CM

## 2015-07-20 DIAGNOSIS — I10 Essential (primary) hypertension: Secondary | ICD-10-CM | POA: Diagnosis present

## 2015-07-20 DIAGNOSIS — D62 Acute posthemorrhagic anemia: Secondary | ICD-10-CM | POA: Diagnosis not present

## 2015-07-20 HISTORY — PX: TOTAL KNEE ARTHROPLASTY: SHX125

## 2015-07-20 SURGERY — ARTHROPLASTY, KNEE, BILATERAL, TOTAL
Anesthesia: Spinal | Site: Knee | Laterality: Bilateral

## 2015-07-20 MED ORDER — CHLORHEXIDINE GLUCONATE 4 % EX LIQD: 60.0000 mL | Freq: Once | CUTANEOUS | Status: DC

## 2015-07-20 MED ORDER — ONDANSETRON HCL 4 MG/2ML IJ SOLN
INTRAMUSCULAR | Status: AC
  Filled 2015-07-20: qty 2

## 2015-07-20 MED ORDER — KETOROLAC TROMETHAMINE 30 MG/ML IJ SOLN
30.0000 mg | Freq: Once | INTRAMUSCULAR | Status: AC
  Administered 2015-07-20: 30 mg via INTRAVENOUS

## 2015-07-20 MED ORDER — MENTHOL 3 MG MT LOZG: 1.0000 | LOZENGE | OROMUCOSAL | Status: DC | PRN

## 2015-07-20 MED ORDER — SODIUM CHLORIDE 0.9 % IR SOLN
Status: DC | PRN
Start: 2015-07-20 — End: 2015-07-20
  Administered 2015-07-20: 3000 mL

## 2015-07-20 MED ORDER — OMEPRAZOLE MAGNESIUM 20 MG PO TBEC: 20.0000 mg | DELAYED_RELEASE_TABLET | Freq: Every day | ORAL | Status: DC

## 2015-07-20 MED ORDER — GLYCOPYRROLATE 0.2 MG/ML IJ SOLN
INTRAMUSCULAR | Status: AC
  Filled 2015-07-20: qty 4

## 2015-07-20 MED ORDER — PROPOFOL 10 MG/ML IV BOLUS
INTRAVENOUS | Status: AC
  Filled 2015-07-20: qty 20

## 2015-07-20 MED ORDER — PHENYLEPHRINE HCL 10 MG/ML IJ SOLN
INTRAMUSCULAR | Status: DC | PRN
  Administered 2015-07-20 (×10): 40 ug via INTRAVENOUS

## 2015-07-20 MED ORDER — CLOPIDOGREL BISULFATE 75 MG PO TABS
75.0000 mg | ORAL_TABLET | Freq: Every day | ORAL | Status: DC
  Administered 2015-07-21 – 2015-07-24 (×4): 75 mg via ORAL
  Filled 2015-07-20 (×4): qty 1

## 2015-07-20 MED ORDER — OXYCODONE HCL 5 MG PO TABS
5.0000 mg | ORAL_TABLET | ORAL | Status: DC | PRN
  Administered 2015-07-20 – 2015-07-22 (×8): 10 mg via ORAL
  Filled 2015-07-20 (×9): qty 2

## 2015-07-20 MED ORDER — LIDOCAINE HCL (CARDIAC) 20 MG/ML IV SOLN
INTRAVENOUS | Status: DC | PRN
  Administered 2015-07-20: 40 mg via INTRAVENOUS

## 2015-07-20 MED ORDER — METOPROLOL TARTRATE 50 MG PO TABS
50.0000 mg | ORAL_TABLET | Freq: Two times a day (BID) | ORAL | Status: DC
  Administered 2015-07-21 – 2015-07-24 (×3): 50 mg via ORAL
  Filled 2015-07-20 (×4): qty 1

## 2015-07-20 MED ORDER — BUPROPION HCL ER (XL) 150 MG PO TB24
300.0000 mg | ORAL_TABLET | Freq: Every day | ORAL | Status: DC
  Administered 2015-07-21 – 2015-07-24 (×4): 300 mg via ORAL
  Filled 2015-07-20 (×4): qty 2

## 2015-07-20 MED ORDER — DIPHENHYDRAMINE HCL 12.5 MG/5ML PO ELIX: 12.5000 mg | ORAL_SOLUTION | ORAL | Status: DC | PRN

## 2015-07-20 MED ORDER — OXYCODONE HCL 5 MG PO TABS: 5.0000 mg | ORAL_TABLET | Freq: Once | ORAL | Status: DC | PRN

## 2015-07-20 MED ORDER — BISACODYL 10 MG RE SUPP: 10.0000 mg | Freq: Every day | RECTAL | Status: DC | PRN

## 2015-07-20 MED ORDER — POLYETHYLENE GLYCOL 3350 17 G PO PACK: 17.0000 g | PACK | Freq: Every day | ORAL | Status: DC | PRN

## 2015-07-20 MED ORDER — POTASSIUM CHLORIDE IN NACL 20-0.9 MEQ/L-% IV SOLN
INTRAVENOUS | Status: DC
  Administered 2015-07-20: 23:00:00 via INTRAVENOUS
  Administered 2015-07-21: 50 mL via INTRAVENOUS
  Filled 2015-07-20 (×2): qty 1000

## 2015-07-20 MED ORDER — METOCLOPRAMIDE HCL 5 MG/ML IJ SOLN: 5.0000 mg | Freq: Three times a day (TID) | INTRAMUSCULAR | Status: DC | PRN

## 2015-07-20 MED ORDER — ACETAMINOPHEN 650 MG RE SUPP: 650.0000 mg | Freq: Four times a day (QID) | RECTAL | Status: DC | PRN

## 2015-07-20 MED ORDER — ASPIRIN EC 325 MG PO TBEC
325.0000 mg | DELAYED_RELEASE_TABLET | Freq: Every day | ORAL | Status: DC
  Administered 2015-07-21 – 2015-07-24 (×4): 325 mg via ORAL
  Filled 2015-07-20 (×4): qty 1

## 2015-07-20 MED ORDER — ROCURONIUM BROMIDE 50 MG/5ML IV SOLN
INTRAVENOUS | Status: AC
  Filled 2015-07-20: qty 1

## 2015-07-20 MED ORDER — KETOROLAC TROMETHAMINE 30 MG/ML IJ SOLN
INTRAMUSCULAR | Status: AC
  Filled 2015-07-20: qty 1

## 2015-07-20 MED ORDER — DOCUSATE SODIUM 100 MG PO CAPS
100.0000 mg | ORAL_CAPSULE | Freq: Two times a day (BID) | ORAL | Status: DC
  Administered 2015-07-20 – 2015-07-24 (×8): 100 mg via ORAL
  Filled 2015-07-20 (×8): qty 1

## 2015-07-20 MED ORDER — ACETAMINOPHEN 325 MG PO TABS
ORAL_TABLET | ORAL | Status: AC
  Filled 2015-07-20: qty 2

## 2015-07-20 MED ORDER — ONDANSETRON HCL 4 MG PO TABS: 4.0000 mg | ORAL_TABLET | Freq: Four times a day (QID) | ORAL | Status: DC | PRN

## 2015-07-20 MED ORDER — ONDANSETRON HCL 4 MG PO TABS: 4.0000 mg | ORAL_TABLET | Freq: Three times a day (TID) | ORAL | Status: DC | PRN

## 2015-07-20 MED ORDER — DEXAMETHASONE SODIUM PHOSPHATE 10 MG/ML IJ SOLN
INTRAMUSCULAR | Status: AC
  Filled 2015-07-20: qty 1

## 2015-07-20 MED ORDER — ASPIRIN EC 325 MG PO TBEC: 325.0000 mg | DELAYED_RELEASE_TABLET | Freq: Every day | ORAL | Status: DC

## 2015-07-20 MED ORDER — METHOCARBAMOL 500 MG PO TABS: 500.0000 mg | ORAL_TABLET | Freq: Four times a day (QID) | ORAL | Status: DC

## 2015-07-20 MED ORDER — NITROGLYCERIN 0.4 MG SL SUBL: 0.4000 mg | SUBLINGUAL_TABLET | SUBLINGUAL | Status: DC | PRN

## 2015-07-20 MED ORDER — ZOLPIDEM TARTRATE 5 MG PO TABS
5.0000 mg | ORAL_TABLET | Freq: Every evening | ORAL | Status: DC | PRN
  Administered 2015-07-23: 5 mg via ORAL
  Filled 2015-07-20: qty 1

## 2015-07-20 MED ORDER — PROPOFOL 10 MG/ML IV BOLUS
INTRAVENOUS | Status: DC | PRN
  Administered 2015-07-20: 20 mg via INTRAVENOUS

## 2015-07-20 MED ORDER — HYDROMORPHONE HCL 1 MG/ML IJ SOLN
0.2500 mg | INTRAMUSCULAR | Status: DC | PRN
Start: 2015-07-20 — End: 2015-07-20
  Administered 2015-07-20: 0.25 mg via INTRAVENOUS
  Administered 2015-07-20: 0.5 mg via INTRAVENOUS

## 2015-07-20 MED ORDER — LIDOCAINE HCL (CARDIAC) 20 MG/ML IV SOLN
INTRAVENOUS | Status: AC
  Filled 2015-07-20: qty 5

## 2015-07-20 MED ORDER — NEOSTIGMINE METHYLSULFATE 10 MG/10ML IV SOLN
INTRAVENOUS | Status: AC
  Filled 2015-07-20: qty 1

## 2015-07-20 MED ORDER — METHOCARBAMOL 500 MG PO TABS
500.0000 mg | ORAL_TABLET | Freq: Four times a day (QID) | ORAL | Status: DC | PRN
  Administered 2015-07-21 – 2015-07-24 (×4): 500 mg via ORAL
  Filled 2015-07-20 (×6): qty 1

## 2015-07-20 MED ORDER — METOCLOPRAMIDE HCL 5 MG PO TABS: 5.0000 mg | ORAL_TABLET | Freq: Three times a day (TID) | ORAL | Status: DC | PRN

## 2015-07-20 MED ORDER — BISACODYL 5 MG PO TBEC: 5.0000 mg | DELAYED_RELEASE_TABLET | Freq: Every day | ORAL | Status: DC | PRN

## 2015-07-20 MED ORDER — HYDROMORPHONE HCL 1 MG/ML IJ SOLN
0.5000 mg | INTRAMUSCULAR | Status: DC | PRN
  Administered 2015-07-20 – 2015-07-22 (×10): 1 mg via INTRAVENOUS
  Filled 2015-07-20 (×10): qty 1

## 2015-07-20 MED ORDER — PHENOL 1.4 % MT LIQD: 1.0000 | OROMUCOSAL | Status: DC | PRN

## 2015-07-20 MED ORDER — PANTOPRAZOLE SODIUM 40 MG PO TBEC
40.0000 mg | DELAYED_RELEASE_TABLET | Freq: Every day | ORAL | Status: DC
  Administered 2015-07-21 – 2015-07-24 (×4): 40 mg via ORAL
  Filled 2015-07-20 (×4): qty 1

## 2015-07-20 MED ORDER — ONDANSETRON HCL 4 MG/2ML IJ SOLN
4.0000 mg | Freq: Four times a day (QID) | INTRAMUSCULAR | Status: DC | PRN
  Administered 2015-07-20: 4 mg via INTRAVENOUS

## 2015-07-20 MED ORDER — BUPIVACAINE LIPOSOME 1.3 % IJ SUSP
20.0000 mL | INTRAMUSCULAR | Status: AC
  Administered 2015-07-20: 20 mL
  Filled 2015-07-20: qty 20

## 2015-07-20 MED ORDER — 0.9 % SODIUM CHLORIDE (POUR BTL) OPTIME
TOPICAL | Status: DC | PRN
  Administered 2015-07-20: 1000 mL

## 2015-07-20 MED ORDER — FENTANYL CITRATE (PF) 250 MCG/5ML IJ SOLN
INTRAMUSCULAR | Status: AC
  Filled 2015-07-20: qty 5

## 2015-07-20 MED ORDER — HYDROMORPHONE HCL 1 MG/ML IJ SOLN
INTRAMUSCULAR | Status: AC
  Filled 2015-07-20: qty 1

## 2015-07-20 MED ORDER — BUPIVACAINE HCL (PF) 0.25 % IJ SOLN
INTRAMUSCULAR | Status: AC
  Filled 2015-07-20: qty 30

## 2015-07-20 MED ORDER — MIDAZOLAM HCL 2 MG/2ML IJ SOLN
INTRAMUSCULAR | Status: AC
  Filled 2015-07-20: qty 2

## 2015-07-20 MED ORDER — OXYCODONE HCL 5 MG/5ML PO SOLN: 5.0000 mg | Freq: Once | ORAL | Status: DC | PRN

## 2015-07-20 MED ORDER — BUPIVACAINE HCL (PF) 0.5 % IJ SOLN
INTRAMUSCULAR | Status: AC
  Filled 2015-07-20: qty 30

## 2015-07-20 MED ORDER — DEXTROSE 5 % IV SOLN
500.0000 mg | Freq: Four times a day (QID) | INTRAVENOUS | Status: DC | PRN
  Administered 2015-07-24: 500 mg via INTRAVENOUS
  Filled 2015-07-20 (×2): qty 5

## 2015-07-20 MED ORDER — OXYCODONE-ACETAMINOPHEN 7.5-325 MG PO TABS: ORAL_TABLET | ORAL | Status: DC

## 2015-07-20 MED ORDER — CEFAZOLIN SODIUM 1-5 GM-% IV SOLN
1.0000 g | Freq: Four times a day (QID) | INTRAVENOUS | Status: AC
  Administered 2015-07-20 (×2): 1 g via INTRAVENOUS
  Filled 2015-07-20 (×2): qty 50

## 2015-07-20 MED ORDER — ACETAMINOPHEN 325 MG PO TABS
650.0000 mg | ORAL_TABLET | Freq: Four times a day (QID) | ORAL | Status: DC | PRN
Start: 2015-07-20 — End: 2015-07-24
  Administered 2015-07-20 – 2015-07-24 (×8): 650 mg via ORAL
  Filled 2015-07-20 (×7): qty 2

## 2015-07-20 MED ORDER — FLUOXETINE HCL 20 MG PO CAPS
20.0000 mg | ORAL_CAPSULE | Freq: Every day | ORAL | Status: DC
  Administered 2015-07-21 – 2015-07-24 (×4): 20 mg via ORAL
  Filled 2015-07-20 (×4): qty 1

## 2015-07-20 MED ORDER — BUPIVACAINE HCL (PF) 0.5 % IJ SOLN
INTRAMUSCULAR | Status: DC | PRN
  Administered 2015-07-20: 3 mL via INTRATHECAL

## 2015-07-20 MED ORDER — PROPOFOL 500 MG/50ML IV EMUL
INTRAVENOUS | Status: DC | PRN
  Administered 2015-07-20: 100 ug/kg/min via INTRAVENOUS

## 2015-07-20 MED ORDER — BENAZEPRIL HCL 5 MG PO TABS
5.0000 mg | ORAL_TABLET | Freq: Every day | ORAL | Status: DC
  Administered 2015-07-21 – 2015-07-22 (×2): 5 mg via ORAL
  Filled 2015-07-20 (×3): qty 1

## 2015-07-20 MED ORDER — MIDAZOLAM HCL 5 MG/5ML IJ SOLN
INTRAMUSCULAR | Status: DC | PRN
  Administered 2015-07-20: 2 mg via INTRAVENOUS

## 2015-07-20 MED ORDER — ONDANSETRON HCL 4 MG/2ML IJ SOLN
INTRAMUSCULAR | Status: DC | PRN
  Administered 2015-07-20: 4 mg via INTRAVENOUS

## 2015-07-20 MED ORDER — GABAPENTIN 300 MG PO CAPS
300.0000 mg | ORAL_CAPSULE | Freq: Every day | ORAL | Status: DC
  Administered 2015-07-20 – 2015-07-23 (×4): 300 mg via ORAL
  Filled 2015-07-20 (×4): qty 1

## 2015-07-20 MED ORDER — MAGNESIUM CITRATE PO SOLN: 1.0000 | Freq: Once | ORAL | Status: DC | PRN

## 2015-07-20 SURGICAL SUPPLY — 71 items
BANDAGE ELASTIC 4 LF NS (GAUZE/BANDAGES/DRESSINGS) ×4 IMPLANT
BANDAGE ELASTIC 6 VELCRO ST LF (GAUZE/BANDAGES/DRESSINGS) ×4 IMPLANT
BANDAGE ESMARK 6X9 LF (GAUZE/BANDAGES/DRESSINGS) ×1 IMPLANT
BENZOIN TINCTURE PRP APPL 2/3 (GAUZE/BANDAGES/DRESSINGS) ×4 IMPLANT
BLADE SAG 18X100X1.27 (BLADE) ×6 IMPLANT
BLADE SURG 10 STRL SS (BLADE) ×2 IMPLANT
BNDG ESMARK 6X9 LF (GAUZE/BANDAGES/DRESSINGS) ×2
BOOTCOVER CLEANROOM LRG (PROTECTIVE WEAR) ×4 IMPLANT
BOWL SMART MIX CTS (DISPOSABLE) ×4 IMPLANT
CAPT KNEE TOTAL 3 ×4 IMPLANT
CEMENT BONE SIMPLEX SPEEDSET (Cement) ×8 IMPLANT
COVER BACK TABLE 24X17X13 BIG (DRAPES) ×2 IMPLANT
COVER SURGICAL LIGHT HANDLE (MISCELLANEOUS) ×4 IMPLANT
CUFF TOURNIQUET SINGLE 34IN LL (TOURNIQUET CUFF) ×4 IMPLANT
DRAPE EXTREMITY BILATERAL (DRAPES) ×2 IMPLANT
DRAPE IMP U-DRAPE 54X76 (DRAPES) ×2 IMPLANT
DRAPE PROXIMA HALF (DRAPES) ×4 IMPLANT
DRAPE U-SHAPE 47X51 STRL (DRAPES) ×4 IMPLANT
DRSG PAD ABDOMINAL 8X10 ST (GAUZE/BANDAGES/DRESSINGS) ×8 IMPLANT
DURAPREP 26ML APPLICATOR (WOUND CARE) ×4 IMPLANT
ELECT CAUTERY BLADE 6.4 (BLADE) ×2 IMPLANT
ELECT REM PT RETURN 9FT ADLT (ELECTROSURGICAL) ×2
ELECTRODE REM PT RTRN 9FT ADLT (ELECTROSURGICAL) ×1 IMPLANT
EVACUATOR 1/8 PVC DRAIN (DRAIN) IMPLANT
FACESHIELD WRAPAROUND (MASK) ×4 IMPLANT
GAUZE SPONGE 4X4 12PLY STRL (GAUZE/BANDAGES/DRESSINGS) ×2 IMPLANT
GAUZE XEROFORM 5X9 LF (GAUZE/BANDAGES/DRESSINGS) ×4 IMPLANT
GLOVE BIOGEL PI IND STRL 7.0 (GLOVE) ×1 IMPLANT
GLOVE BIOGEL PI IND STRL 8 (GLOVE) ×1 IMPLANT
GLOVE BIOGEL PI INDICATOR 7.0 (GLOVE) ×1
GLOVE BIOGEL PI INDICATOR 8 (GLOVE) ×1
GLOVE ECLIPSE 7.0 STRL STRAW (GLOVE) ×2 IMPLANT
GLOVE ORTHO TXT STRL SZ7.5 (GLOVE) ×4 IMPLANT
GOWN STRL REUS W/ TWL LRG LVL3 (GOWN DISPOSABLE) ×3 IMPLANT
GOWN STRL REUS W/ TWL XL LVL3 (GOWN DISPOSABLE) ×1 IMPLANT
GOWN STRL REUS W/TWL LRG LVL3 (GOWN DISPOSABLE) ×3
GOWN STRL REUS W/TWL XL LVL3 (GOWN DISPOSABLE) ×1
HANDPIECE INTERPULSE COAX TIP (DISPOSABLE) ×1
IMMOBILIZER KNEE 22 UNIV (SOFTGOODS) ×4 IMPLANT
IMMOBILIZER KNEE 24 THIGH 36 (MISCELLANEOUS) IMPLANT
IMMOBILIZER KNEE 24 UNIV (MISCELLANEOUS)
KIT BASIN OR (CUSTOM PROCEDURE TRAY) ×2 IMPLANT
KIT ROOM TURNOVER OR (KITS) ×2 IMPLANT
MANIFOLD NEPTUNE II (INSTRUMENTS) ×2 IMPLANT
NS IRRIG 1000ML POUR BTL (IV SOLUTION) ×2 IMPLANT
PACK TOTAL JOINT (CUSTOM PROCEDURE TRAY) ×2 IMPLANT
PACK UNIVERSAL I (CUSTOM PROCEDURE TRAY) ×2 IMPLANT
PAD ARMBOARD 7.5X6 YLW CONV (MISCELLANEOUS) ×4 IMPLANT
PAD CAST 4YDX4 CTTN HI CHSV (CAST SUPPLIES) ×2 IMPLANT
PADDING CAST COTTON 4X4 STRL (CAST SUPPLIES) ×2
PADDING CAST COTTON 6X4 STRL (CAST SUPPLIES) ×4 IMPLANT
RUBBERBAND STERILE (MISCELLANEOUS) ×4 IMPLANT
SET HNDPC FAN SPRY TIP SCT (DISPOSABLE) ×1 IMPLANT
SPONGE LAP 18X18 X RAY DECT (DISPOSABLE) ×4 IMPLANT
STAPLER VISISTAT 35W (STAPLE) IMPLANT
STRIP CLOSURE SKIN 1/2X4 (GAUZE/BANDAGES/DRESSINGS) ×8 IMPLANT
SUCTION FRAZIER HANDLE 10FR (MISCELLANEOUS) ×1
SUCTION TUBE FRAZIER 10FR DISP (MISCELLANEOUS) ×1 IMPLANT
SUT MNCRL AB 4-0 PS2 18 (SUTURE) ×4 IMPLANT
SUT VIC AB 1 CTX 27 (SUTURE) ×8 IMPLANT
SUT VIC AB 1 CTX 36 (SUTURE) ×3
SUT VIC AB 1 CTX36XBRD ANBCTR (SUTURE) ×3 IMPLANT
SUT VIC AB 2-0 CT1 27 (SUTURE) ×4
SUT VIC AB 2-0 CT1 TAPERPNT 27 (SUTURE) ×4 IMPLANT
SUT VIC AB 2-0 SH 27 (SUTURE) ×4
SUT VIC AB 2-0 SH 27XBRD (SUTURE) ×4 IMPLANT
SYR 30ML LL (SYRINGE) ×2 IMPLANT
TOWEL OR 17X24 6PK STRL BLUE (TOWEL DISPOSABLE) ×2 IMPLANT
TOWEL OR 17X26 10 PK STRL BLUE (TOWEL DISPOSABLE) ×2 IMPLANT
TRAY FOLEY CATH 16FRSI W/METER (SET/KITS/TRAYS/PACK) IMPLANT
WATER STERILE IRR 1000ML POUR (IV SOLUTION) IMPLANT

## 2015-07-20 NOTE — Progress Notes (Signed)
Patient complaining of severe head/neck ache since arriving in PACU.  Multiple medications administered, repositioning, ice, and caffeine and patient states no relief.  Notified Dr. Chaney MallingHodierne and received orders for IV Toradol.  Will administer and continue to monitor patient.

## 2015-07-20 NOTE — Interval H&P Note (Signed)
History and Physical Interval Note:  07/20/2015 8:09 AM  Jessica Landry  has presented today for surgery, with the diagnosis of djd bilateral knee  The various methods of treatment have been discussed with the patient and family. After consideration of risks, benefits and other options for treatment, the patient has consented to  Procedure(s): TOTAL KNEE BILATERAL (Bilateral) as a surgical intervention .  The patient's history has been reviewed, patient examined, no change in status, stable for surgery.  I have reviewed the patient's chart and labs.  Questions were answered to the patient's satisfaction.     Loreta Aveaniel F Jamiere Gulas

## 2015-07-20 NOTE — Discharge Instructions (Signed)
INSTRUCTIONS AFTER JOINT REPLACEMENT   o Remove items at home which could result in a fall. This includes throw rugs or furniture in walking pathways o ICE to the affected joint every three hours while awake for 30 minutes at a time, for at least the first 3-5 days, and then as needed for pain and swelling.  Continue to use ice for pain and swelling. You may notice swelling that will progress down to the foot and ankle.  This is normal after surgery.  Elevate your leg when you are not up walking on it.   o Continue to use the breathing machine you got in the hospital (incentive spirometer) which will help keep your temperature down.  It is common for your temperature to cycle up and down following surgery, especially at night when you are not up moving around and exerting yourself.  The breathing machine keeps your lungs expanded and your temperature down.  BLOOD THINNER: TAKE ASPIRIN 325 MG AS DIRECTED FOR 30 DAYS FOLLOWING SURGERY TO PREVENT BLOOD CLOTS.  THIS IS IN ADDITION TO YOUR PLAVIX.  DIET:  As you were doing prior to hospitalization, we recommend a well-balanced diet.  DRESSING / WOUND CARE / SHOWERING  Keep the surgical dressing until follow up.  The dressing is water proof, so you can shower without any extra covering.  IF THE DRESSING FALLS OFF or the wound gets wet inside, change the dressing with sterile gauze.  Please use good hand washing techniques before changing the dressing.  Do not use any lotions or creams on the incision until instructed by your surgeon.    ACTIVITY  o Increase activity slowly as tolerated, but follow the weight bearing instructions below.   o No driving for 6 weeks or until further direction given by your physician.  You cannot drive while taking narcotics.  o No lifting or carrying greater than 10 lbs. until further directed by your surgeon. o Avoid periods of inactivity such as sitting longer than an hour when not asleep. This helps prevent blood clots.    o You may return to work once you are authorized by your doctor.     WEIGHT BEARING   Weight bearing as tolerated with assist device (walker, cane, etc) as directed, use it as long as suggested by your surgeon or therapist, typically at least 4-6 weeks.   EXERCISES  Results after joint replacement surgery are often greatly improved when you follow the exercise, range of motion and muscle strengthening exercises prescribed by your doctor. Safety measures are also important to protect the joint from further injury. Any time any of these exercises cause you to have increased pain or swelling, decrease what you are doing until you are comfortable again and then slowly increase them. If you have problems or questions, call your caregiver or physical therapist for advice.   Rehabilitation is important following a joint replacement. After just a few days of immobilization, the muscles of the leg can become weakened and shrink (atrophy).  These exercises are designed to build up the tone and strength of the thigh and leg muscles and to improve motion. Often times heat used for twenty to thirty minutes before working out will loosen up your tissues and help with improving the range of motion but do not use heat for the first two weeks following surgery (sometimes heat can increase post-operative swelling).   These exercises can be done on a training (exercise) mat, on the floor, on a table or on  a bed. Use whatever works the best and is most comfortable for you.    Use music or television while you are exercising so that the exercises are a pleasant break in your day. This will make your life better with the exercises acting as a break in your routine that you can look forward to.   Perform all exercises about fifteen times, three times per day or as directed.  You should exercise both the operative leg and the other leg as well.  Exercises include:    Quad Sets - Tighten up the muscle on the front of  the thigh (Quad) and hold for 5-10 seconds.    Straight Leg Raises - With your knee straight (if you were given a brace, keep it on), lift the leg to 60 degrees, hold for 3 seconds, and slowly lower the leg.  Perform this exercise against resistance later as your leg gets stronger.   Leg Slides: Lying on your back, slowly slide your foot toward your buttocks, bending your knee up off the floor (only go as far as is comfortable). Then slowly slide your foot back down until your leg is flat on the floor again.   Angel Wings: Lying on your back spread your legs to the side as far apart as you can without causing discomfort.   Hamstring Strength:  Lying on your back, push your heel against the floor with your leg straight by tightening up the muscles of your buttocks.  Repeat, but this time bend your knee to a comfortable angle, and push your heel against the floor.  You may put a pillow under the heel to make it more comfortable if necessary.   A rehabilitation program following joint replacement surgery can speed recovery and prevent re-injury in the future due to weakened muscles. Contact your doctor or a physical therapist for more information on knee rehabilitation.    CONSTIPATION  Constipation is defined medically as fewer than three stools per week and severe constipation as less than one stool per week.  Even if you have a regular bowel pattern at home, your normal regimen is likely to be disrupted due to multiple reasons following surgery.  Combination of anesthesia, postoperative narcotics, change in appetite and fluid intake all can affect your bowels.   YOU MUST use at least one of the following options; they are listed in order of increasing strength to get the job done.  They are all available over the counter, and you may need to use some, POSSIBLY even all of these options:    Drink plenty of fluids (prune juice may be helpful) and high fiber foods Colace 100 mg by mouth twice a day   Senokot for constipation as directed and as needed Dulcolax (bisacodyl), take with full glass of water  Miralax (polyethylene glycol) once or twice a day as needed.  If you have tried all these things and are unable to have a bowel movement in the first 3-4 days after surgery call either your surgeon or your primary doctor.    If you experience loose stools or diarrhea, hold the medications until you stool forms back up.  If your symptoms do not get better within 1 week or if they get worse, check with your doctor.  If you experience "the worst abdominal pain ever" or develop nausea or vomiting, please contact the office immediately for further recommendations for treatment.   ITCHING:  If you experience itching with your medications, try taking only a  single pain pill, or even half a pain pill at a time.  You can also use Benadryl over the counter for itching or also to help with sleep.   TED HOSE STOCKINGS:  Use stockings on both legs until for at least 2 weeks or as directed by physician office. They may be removed at night for sleeping.  MEDICATIONS:  See your medication summary on the After Visit Summary that nursing will review with you.  You may have some home medications which will be placed on hold until you complete the course of blood thinner medication.  It is important for you to complete the blood thinner medication as prescribed.  PRECAUTIONS:  If you experience chest pain or shortness of breath - call 911 immediately for transfer to the hospital emergency department.   If you develop a fever greater that 101 F, purulent drainage from wound, increased redness or drainage from wound, foul odor from the wound/dressing, or calf pain - CONTACT YOUR SURGEON.                                                   FOLLOW-UP APPOINTMENTS:  If you do not already have a post-op appointment, please call the office for an appointment to be seen by your surgeon.  Guidelines for how soon to be seen  are listed in your After Visit Summary, but are typically between 1-4 weeks after surgery.  OTHER INSTRUCTIONS:   Knee Replacement:  Do not place pillow under knee, focus on keeping the knee straight while resting. CPM instructions: 0-90 degrees, 2 hours in the morning, 2 hours in the afternoon, and 2 hours in the evening. Place foam block, curve side up under heel at all times except when in CPM or when walking.  DO NOT modify, tear, cut, or change the foam block in any way.  MAKE SURE YOU:   Understand these instructions.   Get help right away if you are not doing well or get worse.    Thank you for letting us be a part of your medical care team.  It is a privilege we respect greatly.  We hope these instructions will help you stay on track for a fast and full recovery!

## 2015-07-20 NOTE — Transfer of Care (Signed)
Immediate Anesthesia Transfer of Care Note  Patient: Jessica BrookingSarah A Landry  Procedure(s) Performed: Procedure(s): TOTAL KNEE BILATERAL (Bilateral)  Patient Location: PACU  Anesthesia Type:Spinal  Level of Consciousness: awake  Airway & Oxygen Therapy: Patient Spontanous Breathing  Post-op Assessment: Report given to RN and Post -op Vital signs reviewed and stable  Post vital signs: stable  Last Vitals:  Filed Vitals:   07/20/15 0915  BP: 131/76  Pulse: 57  Temp: 36.8 C  Resp: 20    Complications: No apparent anesthesia complications

## 2015-07-20 NOTE — Progress Notes (Signed)
Orthopedic Tech Progress Note Patient Details:  Elizebeth BrookingSarah A Ptacek 06/27/1955 [id number] Start time 1520 CPM Left Knee CPM Left Knee: On Left Knee Flexion (Degrees): 90 Left Knee Extension (Degrees): 0 CPM Right Knee CPM Right Knee: On Right Knee Flexion (Degrees): 90 Right Knee Extension (Degrees): 0  Ortho Devices Ortho Device/Splint Location: applied ohf and footsie roll on bed Ortho Device/Splint Interventions: Ordered, Application, Adjustment   Jennye MoccasinHughes, Jereline Ticer Craig 07/20/2015, 3:44 PM

## 2015-07-20 NOTE — Discharge Summary (Addendum)
Patient ID: Jessica Landry MRN: [id number] DOB/AGE: 60-16-1957 60 y.o.  Admit date: 07/20/2015 Discharge date: 07/22/2015  Admission Diagnoses:  Active Problems:   Status post total bilateral knee replacement   Discharge Diagnoses:  Same  Past Medical History  Diagnosis Date  . ST-segment elevation myocardial infarction (STEMI) of inferior wall (HCC) 03/07/2011    RCA Occlusion --> PCI of mRCA; ECHO 03/2011: EF > 55%, MIld Inferior HK, Mild AoV Sclerosis.  Marland Kitchen CAD S/P percutaneous coronary angioplasty 03/07/2011    a) PCI of mRCA with Promus Element DES 3.5 mm x 28 mm (4.2 -- 3.7 mm); b) Myoview 04/2011: EF 81% (small LV Cavity), fixed basal-mid Inferior Infarct, No Ischemia, 10 METS  . Essential hypertension 03/07/2011  . Hyperlipidemia with target LDL less than 70 2012     Statin Intolerance (tried Lipitor & Crestor)  . History of back surgery   . GERD (gastroesophageal reflux disease)   . Anxiety   . Iron deficiency anemia 03/09/2011  . Insomnia     without significant sleeping disorders  . Neuropathy (HCC)   . Vocal cord polyp 2005  . Depression 03/08/2011    Dr. Leory Plowman- [place], [facility]  . Headache     chocolate & red wine triggers /w bad headaches   . Complication of anesthesia   . PONV (postoperative nausea and vomiting)   . Family history of adverse reaction to anesthesia   . Arthritis     knees & back     Surgeries: Procedure(s): TOTAL KNEE BILATERAL on 07/20/2015   Consultants:    Discharged Condition: Improved  Hospital Course: Jessica Landry is an 60 y.o. female who was admitted 07/20/2015 for operative treatment of primary localized osteoarthritis bilateral knees. Patient has severe unremitting pain that affects sleep, daily activities, and work/hobbies. After pre-op clearance the patient was taken to the operating room on 07/20/2015 and underwent  Procedure(s): TOTAL KNEE BILATERAL.  Patient with a pre-op Hb of 12.3 developed ABLA on pod #1 with a Hb of 9.3  and 8.6 on pod#2.  She is currently stable but we will continue to follow.  Patient was given perioperative antibiotics:      Anti-infectives    Start     Dose/Rate Route Frequency Ordered Stop   07/20/15 1700  ceFAZolin (ANCEF) IVPB 1 g/50 mL premix     1 g 100 mL/hr over 30 Minutes Intravenous Every 6 hours 07/20/15 1652 07/20/15 2339   07/20/15 1030  ceFAZolin (ANCEF) IVPB 2g/100 mL premix     2 g 200 mL/hr over 30 Minutes Intravenous To ShortStay Surgical 07/19/15 1251 07/20/15 1118       Patient was given sequential compression devices, early ambulation, and chemoprophylaxis to prevent DVT.  Patient benefited maximally from hospital stay and there were no complications.    Recent vital signs:  Patient Vitals for the past 24 hrs:  BP Temp Temp src Pulse Resp SpO2  07/22/15 1015 (!) 99/44 mmHg (!) 100.5 F (38.1 C) Oral 86 17 99 %  07/22/15 0500 111/62 mmHg 100.2 F (37.9 C) Oral 95 18 90 %  07/21/15 2100 (!) 77/51 mmHg 98.9 F (37.2 C) Oral 67 18 90 %     Recent laboratory studies:   Recent Labs  07/21/15 0540 07/22/15 0450  WBC 4.5 6.1  HGB 9.8* 8.6*  HCT 29.7* 26.6*  PLT 197 193  NA 137 134*  K 4.1 3.8  CL 101 99*  CO2 26 25  BUN 11 12  CREATININE 0.91 0.99  GLUCOSE 104* 102*  CALCIUM 8.3* 8.2*     Discharge Medications:     Medication List    STOP taking these medications        HYDROcodone-acetaminophen 5-325 MG tablet  Commonly known as:  NORCO/VICODIN     ibuprofen 200 MG tablet  Commonly known as:  ADVIL,MOTRIN     traMADol 50 MG tablet  Commonly known as:  ULTRAM      TAKE these medications        aspirin EC 325 MG tablet  Take 1 tablet (325 mg total) by mouth daily. 1 tab a day for the next 30 days to prevent blood clots.  This is in addition to your plavix     benazepril 5 MG tablet  Commonly known as:  LOTENSIN  Take 1 tablet (5 mg total) by mouth daily.     bisacodyl 5 MG EC tablet  Commonly known as:  DULCOLAX  Take 1  tablet (5 mg total) by mouth daily as needed for moderate constipation.     buPROPion 300 MG 24 hr tablet  Commonly known as:  WELLBUTRIN XL  Take 1 tablet (300 mg total) by mouth daily.     clopidogrel 75 MG tablet  Commonly known as:  PLAVIX  Take 75 mg by mouth daily.     FLUoxetine 20 MG capsule  Commonly known as:  PROZAC  Take 1 capsule (20 mg total) by mouth 2 (two) times daily.     gabapentin 300 MG capsule  Commonly known as:  NEURONTIN  TAKE 2 CAPSULES EVERY NIGHT     methocarbamol 500 MG tablet  Commonly known as:  ROBAXIN  Take 1 tablet (500 mg total) by mouth 4 (four) times daily.     metoprolol 50 MG tablet  Commonly known as:  LOPRESSOR  Take 1 tablet (50 mg total) by mouth 2 (two) times daily.     nitroGLYCERIN 0.4 MG SL tablet  Commonly known as:  NITROSTAT  Place 1 tablet (0.4 mg total) under the tongue every 5 (five) minutes as needed for chest pain (up to 3 tabs in 15 mins and then call 911).     omeprazole 20 MG tablet  Commonly known as:  PRILOSEC OTC  Take 20 mg by mouth daily before breakfast.     ondansetron 4 MG tablet  Commonly known as:  ZOFRAN  Take 1 tablet (4 mg total) by mouth every 8 (eight) hours as needed for nausea or vomiting.     oxyCODONE-acetaminophen 7.5-325 MG tablet  Commonly known as:  PERCOCET  Take 1-2 tabs po q4-6 hours prn pain        Diagnostic Studies: Dg Knee Left Port  07/20/2015  CLINICAL DATA:  Post left total knee replacement EXAM: PORTABLE LEFT KNEE - 1-2 VIEW COMPARISON:  None. FINDINGS: Two portable views of the left knee submitted. There is left knee prosthesis with anatomic alignment. Postsurgical changes are noted with periarticular soft tissue air. IMPRESSION: Left knee prosthesis with anatomic alignment. Postsurgical changes are noted. Electronically Signed   By: Natasha Mead M.D.   On: 07/20/2015 15:01   Dg Knee Right Port  07/20/2015  CLINICAL DATA:  Post right total knee replacement EXAM: PORTABLE RIGHT  KNEE - 1-2 VIEW COMPARISON:  None. FINDINGS: Two views of the right knee submitted. Postsurgical changes are noted with small periarticular soft tissue air. IMPRESSION: Right knee prosthesis with anatomic alignment. Electronically Signed   By: Lang Snow  Pop M.D.   On: 07/20/2015 15:01    Disposition: 01-Home or Self Care    Follow-up Information    Follow up with Loreta Aveaniel F Murphy, MD. Schedule an appointment as soon as possible for a visit in 2 weeks.   Specialty:  Orthopedic Surgery   Contact information:   [address] [place] [address] [phone]       Follow up with [facility].   Why:  Someone from [facility] will contact you concerning start date and time for therapy.   Contact information:   [address] [place] [address] [phone]        Signed: Otilio SaberM Lindsey Tanis Burnley 07/22/2015, 10:32 AM

## 2015-07-20 NOTE — Anesthesia Procedure Notes (Signed)
Spinal  Staffing Anesthesiologist: Elton Catalano Performed by: anesthesiologist  Preanesthetic Checklist Completed: patient identified, site marked, surgical consent, pre-op evaluation, timeout performed, IV checked, risks and benefits discussed and monitors and equipment checked Spinal Block Patient position: sitting Prep: site prepped and draped and DuraPrep Patient monitoring: heart rate, continuous pulse ox and blood pressure Approach: midline Location: L4-5 Injection technique: single-shot Needle Needle type: Pencan  Needle gauge: 24 G Needle length: 9 cm   

## 2015-07-20 NOTE — Anesthesia Preprocedure Evaluation (Addendum)
Anesthesia Evaluation  Patient identified by MRN, date of birth, ID band Patient awake    Reviewed: Allergy & Precautions, NPO status , Patient's Chart, lab work & pertinent test results, reviewed documented beta blocker date and time   History of Anesthesia Complications (+) PONV  Airway Mallampati: II  TM Distance: >3 FB Neck ROM: Full    Dental   Pulmonary former smoker,    breath sounds clear to auscultation       Cardiovascular hypertension, Pt. on medications and Pt. on home beta blockers + CAD, + Past MI and + Cardiac Stents   Rhythm:Regular Rate:Normal     Neuro/Psych  Headaches, Anxiety Depression    GI/Hepatic Neg liver ROS, GERD  ,  Endo/Other  negative endocrine ROS  Renal/GU negative Renal ROS     Musculoskeletal  (+) Arthritis ,   Abdominal   Peds  Hematology negative hematology ROS (+) Plavix stopped 7 days ago.   Anesthesia Other Findings   Reproductive/Obstetrics                            Lab Results  Component Value Date   WBC 5.2 07/08/2015   HGB 12.3 07/08/2015   HCT 37.7 07/08/2015   MCV 92.0 07/08/2015   PLT 256 07/08/2015   Lab Results  Component Value Date   CREATININE 1.04* 07/08/2015   BUN 12 07/08/2015   NA 141 07/08/2015   K 4.4 07/08/2015   CL 106 07/08/2015   CO2 27 07/08/2015   Lab Results  Component Value Date   INR 1.02 05/26/2015   INR 1.52* 03/08/2011   INR 6.81* 03/07/2011    Anesthesia Physical Anesthesia Plan  ASA: III  Anesthesia Plan: Spinal   Post-op Pain Management:    Induction: Intravenous  Airway Management Planned: Natural Airway and Simple Face Mask  Additional Equipment:   Intra-op Plan:   Post-operative Plan:   Informed Consent: I have reviewed the patients History and Physical, chart, labs and discussed the procedure including the risks, benefits and alternatives for the proposed anesthesia with the patient  or authorized representative who has indicated his/her understanding and acceptance.     Plan Discussed with: CRNA  Anesthesia Plan Comments:        Anesthesia Quick Evaluation

## 2015-07-20 NOTE — Op Note (Signed)
NAMThermon Landry:  Landry, Jessica Landry              ACCOUNT NO.:  [id number]  MEDICAL RECORD NO.:  [id number]  LOCATION:  5N10C                        FACILITY:  [facility]  PHYSICIAN:  Loreta Aveaniel F. Passion Lavin, M.D. DATE OF BIRTH:  1955/05/23  DATE OF PROCEDURE: DATE OF DISCHARGE:                              OPERATIVE REPORT   PREOPERATIVE DIAGNOSIS:  End-stage arthritis, primary generalized right and left knee.  Valgus alignment, bone loss laterally on the right. Varus alignment on the left.  POSTOPERATIVE DIAGNOSIS:  End-stage arthritis, primary generalized right and left knee.  Valgus alignment, bone loss laterally on the right. Varus alignment on the left.  PROCEDURE: 1. Right knee modified minimally invasive total knee replacement     Stryker Triathlon prosthesis.  Soft tissue balancing. 2. Left knee modified minimally invasive total knee replacement     Stryker Triathlon prosthesis.  Soft tissue balancing.  On both     sides, I utilized a cemented pegged cruciate retaining #4 femoral     component.  A cemented #4 tibial component, 9 mm CS insert.     Cemented medial offset resurfacing 32-mm patellar component.  SURGEON:  Loreta Aveaniel F. Maaz Spiering, MD  ASSISTANT:  Mikey KirschnerLindsey Stanberry, PA, present throughout the entire case and necessary for timely completion of procedure.  ANESTHESIA:  Long-acting spinal.  SPECIMENS:  None.  COUNTS:  None.  COMPLICATIONS:  None.  DRESSING:  Soft compressive knee immobilizer, both sides.  TOURNIQUET TIME:  1 hour each knee.  DESCRIPTION OF PROCEDURE:  The patient was brought to operating room and after adequate anesthesia had been obtained, tourniquet applied both legs, both were prepped draped in the usual sterile fashion.  Attention was turned to the right knee 1st.  Exsanguinated with elevation of Esmarch.  Tourniquet inflated to 350 mmHg.  A longitudinal incision above the patella, down the tibial tubercle.  Medial arthrotomy, vastus splitting.  Intramedullary  guide distal femur.  8 mm resection 5 degrees of valgus.  Using epicondylar axis, the femur was sized, cut, and fitted for a cruciate retaining #4 component.  Extramedullary guide, proximal tibia.  A 3-degree posterior slope cut.  Mostly resection on the medial side.  Size #4 component.  Patella exposed.  Very eroded, posterior aspect removed, drill sized and fitted for a 32-mm component.  Rotation of the tibial component with several trials and then hand reamed.  At completion, after thorough irrigation, all components firmly cemented. Polyethylene attached to tibia, knee reduced.  Patella held with a clamp.  Once cement hardened, the knee was irrigated again.  Soft tissue was injected with Exparel.  Arthrotomy closed with #1 Vicryl, subcutaneous and subcuticular closure.  Margins were injected with Marcaine.  Temporary dressing applied.  Tourniquet deflated and removed. Attention turned to the left.  Exsanguinated with elevation of Esmarch. Tourniquet inflated to 350 mmHg.  Longitudinal incision above the patella, down the tibial tubercle.  Medial arthrotomy, vastus splitting. Distal femur exposed.  Intramedullary flexible rod.  8 mm resection, 5 degrees valgus.  Using epicondylar axis, the femur was sized, cut, and fitted for a pegged cruciate retaining #4 component.  Proximal tibial resection extramedullary guide.  Most of the bone taken off laterally. Sized to #4 component.  Patella exposed, posterior 10 mm removed. Drilled, sized, and fitted for a 32-mm component.  Trials put in place. A 9-mm CS insert.  Rotation set with trials and tibia hand reamed.  All trials removed.  Copious irrigation with pulse irrigating device. Cement prepared and placed on all components, firmly seated. Polyethylene attached to tibia, knee reduced.  Patella with a clamp. Once cement hardened, the knee was irrigated once again.  Soft tissues injected with Exparel.  Arthrotomy closed with #1 Vicryl.  Skin  and subcutaneous tissue was closed.  Margins were injected with Marcaine. Sterile compressive dressing applied.  Tourniquet deflated and removed. Appropriate dressing was placed on both sides.  Knee immobilizer placed. Anesthesia reversed.  Brought to the recovery room.  Tolerated the surgery well.  No complications.     Loreta Ave, M.D.     DFM/MEDQ  D:  07/20/2015  T:  07/20/2015  Job:  [id number]

## 2015-07-21 ENCOUNTER — Encounter (HOSPITAL_COMMUNITY): Payer: Self-pay | Admitting: Orthopedic Surgery

## 2015-07-21 LAB — BASIC METABOLIC PANEL
Anion gap: 10 (ref 5–15)
BUN: 11 mg/dL (ref 6–20)
CALCIUM: 8.3 mg/dL — AB (ref 8.9–10.3)
CHLORIDE: 101 mmol/L (ref 101–111)
CO2: 26 mmol/L (ref 22–32)
CREATININE: 0.91 mg/dL (ref 0.44–1.00)
GFR calc Af Amer: >60 mL/min (ref >60)
GFR calc non Af Amer: >60 mL/min (ref >60)
Glucose, Bld: 104 mg/dL — ABNORMAL HIGH (ref 65–99)
Potassium: 4.1 mmol/L (ref 3.5–5.1)
SODIUM: 137 mmol/L (ref 135–145)

## 2015-07-21 LAB — CBC
HCT: 29.7 % — ABNORMAL LOW (ref 36.0–46.0)
HEMOGLOBIN: 9.8 g/dL — AB (ref 12.0–15.0)
MCH: 30.4 pg (ref 26.0–34.0)
MCHC: 33 g/dL (ref 30.0–36.0)
MCV: 92.2 fL (ref 78.0–100.0)
Platelets: 197 10*3/uL (ref 150–400)
RBC: 3.22 MIL/uL — ABNORMAL LOW (ref 3.87–5.11)
RDW: 13.3 % (ref 11.5–15.5)
WBC: 4.5 10*3/uL (ref 4.0–10.5)

## 2015-07-21 MED ORDER — SODIUM CHLORIDE 0.9 % IV BOLUS (SEPSIS)
500.0000 mL | Freq: Once | INTRAVENOUS | Status: AC
  Administered 2015-07-22: 500 mL via INTRAVENOUS

## 2015-07-21 MED ORDER — SODIUM CHLORIDE 0.9 % IV SOLN
INTRAVENOUS | Status: DC
  Administered 2015-07-22: via INTRAVENOUS

## 2015-07-21 NOTE — Progress Notes (Signed)
Orthopedic Tech Progress Note Patient Details:  Elizebeth BrookingSarah A Ines 02/26/1956 [id number]  Ortho Devices Ortho Device/Splint Location: foot roll Ortho Device/Splint Interventions: Ordered, Application, Adjustment   Saul FordyceJennifer C Aaleyah Witherow 07/21/2015, 11:18 AM

## 2015-07-21 NOTE — Care Management Note (Signed)
Case Management Note  Patient Details  Name: Jessica Landry MRN: [id number] Date of Birth: 07/27/1955  Subjective/Objective:  60 yr old female s/p bilateral knees arthroplasty.                  Action/Plan: Case manager spoke with patient concerning home health and DME needs for discharge. Choice was offered for home health agencies. Referral was called to Fara Chuteiffany Clayton, [facility] Liaison. Patient states she has rolling walker, she has high toilets and grab bars so will not need 3in1. CPM's will be delivered to her home at discharge. Patient's husband will assist her at discharge.  Expected Discharge Date:    07/22/15              Expected Discharge Plan:  Home w Home Health Services  In-House Referral:  NA  Discharge planning Services  CM Consult  Post Acute Care Choice:  Durable Medical Equipment, Home Health Choice offered to:     DME Arranged:  CPM DME Agency:  [employer]  HH Arranged:  PT HH Agency:  [facility]  Status of Service:  Completed, signed off  Medicare Important Message Given:    Date Medicare IM Given:    Medicare IM give by:    Date Additional Medicare IM Given:    Additional Medicare Important Message give by:     If discussed at Long Length of Stay Meetings, dates discussed:    Additional Comments:  Durenda GuthrieBrady, Clint Strupp Naomi, RN 07/21/2015, 10:18 AM

## 2015-07-21 NOTE — Progress Notes (Signed)
Physical Therapy Treatment Patient Details Name: Jessica Landry MRN: [id number] DOB: October 23, 1955 Today's Date: 07/21/2015    History of Present Illness 60 yo admitted for bil TKA. PMHx: anemia, anxiety, HTN    PT Comments    Pt pleasant upon arrival. Pt had difficulty maintaining alertness throughout session. Pt improved from AM session, but still has deficits in strength, ROM, and activity tolerance. Educated pt on use of bone foam. Will continue to follow.  Follow Up Recommendations  Home health PT;Supervision - Intermittent     Equipment Recommendations       Recommendations for Other Services       Precautions / Restrictions Precautions Precautions: Knee;Fall Restrictions Weight Bearing Restrictions: Yes RLE Weight Bearing: Weight bearing as tolerated LLE Weight Bearing: Weight bearing as tolerated    Mobility  Bed Mobility               General bed mobility comments: pt in chair upon arrival  Transfers Overall transfer level: Needs assistance Equipment used: None Transfers: Sit to/from Stand Sit to Stand: Min guard         General transfer comment: increased time to rise fully. min guard for safety. increased upper body use to complete transfer  Ambulation/Gait Ambulation/Gait assistance: Min guard;+2 safety/equipment Ambulation Distance (Feet): 90 Feet Assistive device: Rolling walker (2 wheeled) Gait Pattern/deviations: Step-through pattern;Trunk flexed;Decreased stride length   Gait velocity interpretation: Below normal speed for age/gender General Gait Details: cues for posture. chair to follow   Stairs            Wheelchair Mobility    Modified Rankin (Stroke Patients Only)       Balance                                    Cognition Arousal/Alertness: Awake/alert Behavior During Therapy: WFL for tasks assessed/performed Overall Cognitive Status: Within Functional Limits for tasks assessed                       Exercises Total Joint Exercises Heel Slides: AROM;Seated;Both;15 reps Straight Leg Raises: AROM;Both;Seated;15 reps Long Arc Quad: AROM;Seated;Both;15 reps Goniometric ROM: left knee 4-74, right knee 13-74    General Comments        Pertinent Vitals/Pain Pain Assessment: 0-10 Pain Score: 5  Pain Location: bil knees Pain Descriptors / Indicators: Aching Pain Intervention(s): Limited activity within patient's tolerance;Monitored during session;Repositioned;Patient requesting pain meds-RN notified;Ice applied    Home Living                      Prior Function            PT Goals (current goals can now be found in the care plan section) Progress towards PT goals: Progressing toward goals    Frequency       PT Plan Current plan remains appropriate    Co-evaluation             End of Session Equipment Utilized During Treatment: Gait belt Activity Tolerance: Patient tolerated treatment well Patient left: in chair;with family/visitor present;with call bell/phone within reach     Time: 1200-1230 PT Time Calculation (min) (ACUTE ONLY): 30 min  Charges:  $Gait Training: 8-22 mins $Therapeutic Exercise: 8-22 mins                    G Codes:      Rejeana Brock  Pricila Bridge 07/21/2015, 12:55 PM  Cherene JulianPaola Aretha Levi, SPT [phone]

## 2015-07-21 NOTE — Evaluation (Signed)
Physical Therapy Evaluation Patient Details Name: Jessica BrookingSarah A Landry MRN: [id number] DOB: 08/21/1955 Today's Date: 07/21/2015   History of Present Illness  60 yo admitted for bil TKA. PMHx: anemia, anxiety, HTN  Clinical Impression  Pt very pleasant, positive and eager to progress mobility for return home. Pt's sister had bil TKA performed and pt familiar with process and recovery. Pt with decreased strength, ROm, transfers, gait and function who will benefit from acute therapy to maximize mobility, function and gait to decrease burden of care and improve quality of life. Pt educated for CPM use, bone foam, transfers, progression and plan.     Follow Up Recommendations Home health PT;Supervision - Intermittent    Equipment Recommendations  3in1 (PT)    Recommendations for Other Services OT consult     Precautions / Restrictions Precautions Precautions: Knee;Fall Restrictions Weight Bearing Restrictions: Yes RLE Weight Bearing: Weight bearing as tolerated LLE Weight Bearing: Weight bearing as tolerated      Mobility  Bed Mobility Overal bed mobility: Modified Independent                Transfers Overall transfer level: Needs assistance   Transfers: Sit to/from Stand Sit to Stand: Min assist         General transfer comment: cues for hand placement and safety, assist to rise fully, increased upper body use to complete transfers  Ambulation/Gait Ambulation/Gait assistance: Min guard;+2 safety/equipment Ambulation Distance (Feet): 35 Feet Assistive device: Rolling walker (2 wheeled) Gait Pattern/deviations: Step-to pattern;Decreased stride length;Trunk flexed   Gait velocity interpretation: Below normal speed for age/gender General Gait Details: cues for posture, position in RW, sequence and chair to follow due to pt fatigues quickly  Stairs            Wheelchair Mobility    Modified Rankin (Stroke Patients Only)       Balance Overall balance  assessment: No apparent balance deficits (not formally assessed)                                           Pertinent Vitals/Pain Pain Assessment: 0-10 Pain Score: 2  Pain Location: bil knees Pain Descriptors / Indicators: Aching Pain Intervention(s): Limited activity within patient's tolerance;Premedicated before session;Monitored during session;Repositioned;Ice applied    Home Living Family/patient expects to be discharged to:: Private residence Living Arrangements: Spouse/significant other Available Help at Discharge: Available 24 hours/day Type of Home: House Home Access: Stairs to enter Entrance Stairs-Rails: Right Entrance Stairs-Number of Steps: 3 Home Layout: One level Home Equipment: Walker - 2 wheels;Crutches;Adaptive equipment      Prior Function Level of Independence: Independent               Hand Dominance        Extremity/Trunk Assessment   Upper Extremity Assessment: Overall WFL for tasks assessed           Lower Extremity Assessment: RLE deficits/detail;LLE deficits/detail RLE Deficits / Details: decreased strength and ROm as expected post op LLE Deficits / Details: decreased strength and ROm as expected post op  Cervical / Trunk Assessment: Normal  Communication   Communication: No difficulties  Cognition Arousal/Alertness: Awake/alert Behavior During Therapy: WFL for tasks assessed/performed Overall Cognitive Status: Within Functional Limits for tasks assessed                      General Comments  Exercises Total Joint Exercises Heel Slides: AROM;Both;Supine;5 reps Hip ABduction/ADduction: AROM;Seated;Both;10 reps Straight Leg Raises: AROM;Both;10 reps;Supine      Assessment/Plan    PT Assessment Patient needs continued PT services  PT Diagnosis Difficulty walking;Acute pain   PT Problem List Decreased strength;Decreased range of motion;Decreased activity tolerance;Decreased  mobility;Pain;Decreased knowledge of use of DME  PT Treatment Interventions DME instruction;Gait training;Stair training;Functional mobility training;Therapeutic activities;Therapeutic exercise;Patient/family education   PT Goals (Current goals can be found in the Care Plan section) Acute Rehab PT Goals Patient Stated Goal: be able to walk PT Goal Formulation: With patient Time For Goal Achievement: 07/28/15 Potential to Achieve Goals: Good    Frequency 7X/week   Barriers to discharge        Co-evaluation               End of Session Equipment Utilized During Treatment: Gait belt Activity Tolerance: Patient tolerated treatment well Patient left: in chair;with call bell/phone within reach Nurse Communication: Mobility status         Time: 0720-0751 PT Time Calculation (min) (ACUTE ONLY): 31 min   Charges:   PT Evaluation $PT Eval Moderate Complexity: 1 Procedure PT Treatments $Gait Training: 8-22 mins   PT G CodesDelorse Lek 07/21/2015, 8:07 AM  Delaney Meigs, PT [phone]

## 2015-07-21 NOTE — Progress Notes (Signed)
Subjective: 1 Day Post-Op Procedure(s) (LRB): TOTAL KNEE BILATERAL (Bilateral) Patient reports pain as moderate.  Main complaint has been headache since arriving to [facility] yesterday.  Somewhat diminished today, but still bothersome.  No vision change.  Patient did have spinal anesthesia.  Otherwise, she is doing well.  No chest pain/sob, lightheadedness/dizziness, nausea/vomiting.  Good appetite.   Objective: Vital signs in last 24 hours: Temp:  [96.4 F (35.8 C)-98.3 F (36.8 C)] 97.5 F (36.4 C) (04/13 0435) Pulse Rate:  [54-63] 60 (04/13 0435) Resp:  [9-20] 17 (04/13 0435) BP: (93-131)/(54-85) 122/68 mmHg (04/13 0435) SpO2:  [95 %-100 %] 100 % (04/13 0435) Weight:  [74.39 kg (164 lb)] 74.39 kg (164 lb) (04/12 0915)  Intake/Output from previous day: 04/12 0701 - 04/13 0700 In: 1700 [I.V.:1600; IV Piggyback:100] Out: 1075 [Urine:1000; Blood:75] Intake/Output this shift:     Recent Labs  07/21/15 0540  HGB 9.8*    Recent Labs  07/21/15 0540  WBC 4.5  RBC 3.22*  HCT 29.7*  PLT 197   No results for input(s): NA, K, CL, CO2, BUN, CREATININE, GLUCOSE, CALCIUM in the last 72 hours. No results for input(s): LABPT, INR in the last 72 hours.  Neurologically intact Neurovascular intact Sensation intact distally Intact pulses distally Dorsiflexion/Plantar flexion intact Compartment soft  No drainage through either dressing Negative calf ttp/homans bilaterally  Assessment/Plan: 1 Day Post-Op Procedure(s) (LRB): TOTAL KNEE BILATERAL (Bilateral) Advance diet Up with therapy  WBAT BLE ABLA-mild and stable Dry dressing change prn Anticipate d/c home with hhpt possibly tomorrow Will speak with anesthesia regarding ha  Otilio SaberM Lindsey Suzzanne Brunkhorst 07/21/2015, 7:11 AM

## 2015-07-21 NOTE — Progress Notes (Signed)
Occupational Therapy Evaluation Patient Details Name: Jessica BrookingSarah A Landry MRN: [id number] DOB: 07/16/1955 Today's Date: 07/21/2015    History of Present Illness 60 yo admitted for bil TKA. PMHx: anemia, anxiety, HTN   Clinical Impression   PTA, pt was independent with ADLs and mobility. Pt currently requires min assist for LB ADLs and min guard assist for functional transfers. Pt plans to d/c home with 24/7 assistance from her husband initially and other family members after that. Pt will benefit from continued acute OT to increase independence and safety with ADLs to allow for safe discharge home. Recommend 3in1 for home use.    Follow Up Recommendations  No OT follow up;Supervision/Assistance - 24 hour    Equipment Recommendations  3 in 1 bedside comode    Recommendations for Other Services       Precautions / Restrictions Precautions Precautions: Knee;Fall Restrictions Weight Bearing Restrictions: Yes RLE Weight Bearing: Weight bearing as tolerated LLE Weight Bearing: Weight bearing as tolerated      Mobility Bed Mobility               General bed mobility comments: Pt up in chair on OT arrival  Transfers Overall transfer level: Needs assistance Equipment used: Rolling walker (2 wheeled) Transfers: Sit to/from Stand Sit to Stand: Min guard         General transfer comment: Min guard assist for safety. No LOB observed or dizziness upon standing.    Balance Overall balance assessment: Needs assistance Sitting-balance support: No upper extremity supported;Feet supported Sitting balance-Leahy Scale: Good     Standing balance support: Bilateral upper extremity supported;During functional activity Standing balance-Leahy Scale: Poor Standing balance comment: Reliant on RW for UE support to maintain balance                            ADL Overall ADL's : Needs assistance/impaired     Grooming: Wash/dry hands;Min guard;Standing   Upper Body  Bathing: Set up;Sitting   Lower Body Bathing: Minimal assistance;Sit to/from stand Lower Body Bathing Details (indicate cue type and reason): Unable to reach feet Upper Body Dressing : Set up;Sitting   Lower Body Dressing: Minimal assistance;Sit to/from stand Lower Body Dressing Details (indicate cue type and reason): to start clothing over feet Toilet Transfer: Min guard;Comfort height toilet;Grab bars;RW;Cueing for safety Toilet Transfer Details (indicate cue type and reason): Pt declined use of 3in1 initially and after attempting to complete transfer without it and decided it was necessary Toileting- Clothing Manipulation and Hygiene: Min guard;Sit to/from stand       Functional mobility during ADLs: Min guard;Rolling walker General ADL Comments: Reviewed knee precautions, use of CPM/0 degree bone foam, and compensatory strategies for ADLs. Husband present for OT eval.     Vision Vision Assessment?: No apparent visual deficits   Perception     Praxis      Pertinent Vitals/Pain Pain Assessment: 0-10 Pain Score: 5  Pain Location: bilateral knees Pain Descriptors / Indicators: Aching Pain Intervention(s): Limited activity within patient's tolerance;Monitored during session;Premedicated before session;Repositioned     Hand Dominance Right   Extremity/Trunk Assessment Upper Extremity Assessment Upper Extremity Assessment: Overall WFL for tasks assessed   Lower Extremity Assessment Lower Extremity Assessment: RLE deficits/detail;LLE deficits/detail RLE Deficits / Details: decreased strength and ROm as expected post op LLE Deficits / Details: decreased strength and ROm as expected post op   Cervical / Trunk Assessment Cervical / Trunk Assessment: Normal   Communication Communication  Communication: No difficulties   Cognition Arousal/Alertness: Awake/alert Behavior During Therapy: WFL for tasks assessed/performed Overall Cognitive Status: Within Functional Limits for  tasks assessed                     General Comments       Exercises       Shoulder Instructions      Home Living Family/patient expects to be discharged to:: Private residence Living Arrangements: Spouse/significant other Available Help at Discharge: Family;Available 24 hours/day Type of Home: House Home Access: Stairs to enter [employer] of Steps: 3 Entrance Stairs-Rails: Right Home Layout: One level     Bathroom Shower/Tub: Producer, television/film/video: Handicapped height     Home Equipment: Environmental consultant - 2 wheels;Crutches;Adaptive equipment;Grab bars - tub/shower;Grab bars - toilet Adaptive Equipment: Reacher        Prior Functioning/Environment Level of Independence: Independent             OT Diagnosis: Acute pain   OT Problem List: Decreased strength;Decreased range of motion;Decreased activity tolerance;Impaired balance (sitting and/or standing);Decreased coordination;Decreased safety awareness;Decreased knowledge of use of DME or AE;Decreased knowledge of precautions;Pain   OT Treatment/Interventions: Self-care/ADL training;Therapeutic exercise;Energy conservation;DME and/or AE instruction;Therapeutic activities;Patient/family education;Balance training    OT Goals(Current goals can be found in the care plan section) Acute Rehab OT Goals Patient Stated Goal: to get back to playing with my dogs OT Goal Formulation: With patient Time For Goal Achievement: 08/04/15 Potential to Achieve Goals: Good ADL Goals Pt Will Perform Lower Body Bathing: with supervision;sit to/from stand Pt Will Perform Lower Body Dressing: with supervision;sit to/from stand Pt Will Transfer to Toilet: with supervision;ambulating;bedside commode (over toilet) Pt Will Perform Toileting - Clothing Manipulation and hygiene: with supervision;sitting/lateral leans;sit to/from stand Pt Will Perform Tub/Shower Transfer: Shower transfer;with supervision;ambulating;3 in  1;rolling walker  OT Frequency: Min 2X/week   Barriers to D/C:            Co-evaluation              End of Session Equipment Utilized During Treatment: Gait belt;Rolling walker CPM Left Knee CPM Left Knee: Off Additional Comments: 0 degree bone foam applied CPM Right Knee CPM Right Knee: Off Additional Comments: 0 degree bone foam applied Nurse Communication: Mobility status  Activity Tolerance: Patient tolerated treatment well Patient left: in chair;with call bell/phone within reach;with family/visitor present   Time: 4098-1191 OT Time Calculation (min): 24 min Charges:  OT General Charges $OT Visit: 1 Procedure OT Evaluation $OT Eval Moderate Complexity: 1 Procedure OT Treatments $Self Care/Home Management : 8-22 mins G-Codes:    Nils Pyle, OTR/L Pager: [phone] 07/21/2015, 3:24 PM

## 2015-07-21 NOTE — Progress Notes (Signed)
Orthopedic Tech Progress Note Patient Details:  Elizebeth BrookingSarah A Herrle 03/29/1956 [id number]  Patient ID: Elizebeth BrookingSarah A Cerreta, female   DOB: 02/16/1956, 60 y.o.   MRN: [id number] Applied cpm 0-60 bi  Trinna PostMartinez, Daevion Navarette J 07/21/2015, 5:42 AM

## 2015-07-21 NOTE — Progress Notes (Signed)
Utilization review completed.  

## 2015-07-22 LAB — CBC
HEMATOCRIT: 26.6 % — AB (ref 36.0–46.0)
Hemoglobin: 8.6 g/dL — ABNORMAL LOW (ref 12.0–15.0)
MCH: 29.8 pg (ref 26.0–34.0)
MCHC: 32.3 g/dL (ref 30.0–36.0)
MCV: 92 fL (ref 78.0–100.0)
PLATELETS: 193 10*3/uL (ref 150–400)
RBC: 2.89 MIL/uL — AB (ref 3.87–5.11)
RDW: 13.5 % (ref 11.5–15.5)
WBC: 6.1 10*3/uL (ref 4.0–10.5)

## 2015-07-22 LAB — BASIC METABOLIC PANEL
ANION GAP: 10 (ref 5–15)
BUN: 12 mg/dL (ref 6–20)
CALCIUM: 8.2 mg/dL — AB (ref 8.9–10.3)
CO2: 25 mmol/L (ref 22–32)
Chloride: 99 mmol/L — ABNORMAL LOW (ref 101–111)
Creatinine, Ser: 0.99 mg/dL (ref 0.44–1.00)
Glucose, Bld: 102 mg/dL — ABNORMAL HIGH (ref 65–99)
POTASSIUM: 3.8 mmol/L (ref 3.5–5.1)
Sodium: 134 mmol/L — ABNORMAL LOW (ref 135–145)

## 2015-07-22 MED ORDER — SODIUM CHLORIDE 0.9 % IV SOLN
Freq: Once | INTRAVENOUS | Status: AC
  Administered 2015-07-22: 21:00:00 via INTRAVENOUS

## 2015-07-22 MED ORDER — SODIUM CHLORIDE 0.9 % IV BOLUS (SEPSIS)
500.0000 mL | Freq: Once | INTRAVENOUS | Status: AC
  Administered 2015-07-22: 500 mL via INTRAVENOUS

## 2015-07-22 MED ORDER — CALCIUM CARBONATE ANTACID 500 MG PO CHEW
3.0000 | CHEWABLE_TABLET | Freq: Four times a day (QID) | ORAL | Status: DC | PRN
  Administered 2015-07-22: 600 mg via ORAL
  Filled 2015-07-22: qty 3

## 2015-07-22 MED ORDER — OXYCODONE HCL ER 10 MG PO T12A
10.0000 mg | EXTENDED_RELEASE_TABLET | Freq: Two times a day (BID) | ORAL | Status: DC
  Administered 2015-07-22 – 2015-07-23 (×2): 10 mg via ORAL
  Filled 2015-07-22 (×2): qty 1

## 2015-07-22 MED ORDER — OXYCODONE HCL 5 MG PO TABS
5.0000 mg | ORAL_TABLET | ORAL | Status: DC | PRN
  Administered 2015-07-22: 10 mg via ORAL
  Administered 2015-07-23 – 2015-07-24 (×3): 15 mg via ORAL
  Administered 2015-07-24: 5 mg via ORAL
  Filled 2015-07-22 (×2): qty 3
  Filled 2015-07-22: qty 2
  Filled 2015-07-22: qty 3
  Filled 2015-07-22: qty 1
  Filled 2015-07-22: qty 3

## 2015-07-22 NOTE — Progress Notes (Signed)
Physical Therapy Treatment Patient Details Name: Jessica BrookingSarah A Landry MRN: [id number] DOB: 11/09/1955 Today's Date: 07/22/2015    History of Present Illness 60 yo admitted for bil TKA. PMHx: anemia, anxiety, HTN    PT Comments    Pt with continued progression with gait and HEP. Pt educated for stairs, HEP, CPM use and continued progression of plan. Handout for stairs provided and pt placed in bil CPM end of session. Pt limited by lethargy due to pain meds today and needs cues for attention.   Follow Up Recommendations  Home health PT;Supervision - Intermittent     Equipment Recommendations       Recommendations for Other Services       Precautions / Restrictions Precautions Precautions: Knee;Fall Restrictions Weight Bearing Restrictions: Yes RLE Weight Bearing: Weight bearing as tolerated LLE Weight Bearing: Weight bearing as tolerated    Mobility  Bed Mobility Overal bed mobility: Modified Independent             General bed mobility comments: sit to supine mod I  Transfers Overall transfer level: Needs assistance Equipment used: Rolling walker (2 wheeled) Transfers: Sit to/from Stand Sit to Stand: Supervision;Min assist         General transfer comment: supervision for safety and lines from recliner x 2 supervision, toilet min assist to rise, minguard to sit at bed and toilet  Ambulation/Gait Ambulation/Gait assistance: Min guard Ambulation Distance (Feet): 115 Feet Assistive device: Rolling walker (2 wheeled) Gait Pattern/deviations: Step-through pattern;Decreased stride length;Trunk flexed   Gait velocity interpretation: Below normal speed for age/gender General Gait Details: cues for posture. position in RW, to relax shoulders, chair to follow. 6435' then 115' after seated rest   Stairs Stairs: Yes Stairs assistance: Min assist Stair Management: Backwards;With walker Number of Stairs: 4 General stair comments: cues for sequence, safety and assist for  RW management  Wheelchair Mobility    Modified Rankin (Stroke Patients Only)       Balance Overall balance assessment: Needs assistance Sitting-balance support: Feet supported;No upper extremity supported Sitting balance-Leahy Scale: Good     Standing balance support: Bilateral upper extremity supported;During functional activity Standing balance-Leahy Scale: Poor                      Cognition Arousal/Alertness: Lethargic;Suspect due to medications Behavior During Therapy: [facility] for tasks assessed/performed Overall Cognitive Status: Within Functional Limits for tasks assessed                      Exercises Total Joint Exercises Heel Slides: AROM;Seated;Both;15 reps Hip ABduction/ADduction: AROM;Seated;Both;15 reps Straight Leg Raises: AROM;Both;Seated;15 reps Long Arc Quad: AROM;Seated;Both;15 reps Goniometric ROM: left knee 4-76, right knee  8-74    General Comments        Pertinent Vitals/Pain Pain Assessment: 0-10 Pain Score: 5  Faces Pain Scale: Hurts whole lot Pain Location: bil knees Pain Descriptors / Indicators: Aching;Sore Pain Intervention(s): Premedicated before session;Repositioned;Limited activity within patient's tolerance    Home Living                      Prior Function            PT Goals (current goals can now be found in the care plan section) Acute Rehab PT Goals Patient Stated Goal: to get back to playing with my dogs Progress towards PT goals: Progressing toward goals    Frequency       PT Plan Current plan remains appropriate  Co-evaluation             End of Session Equipment Utilized During Treatment: Gait belt Activity Tolerance: Patient tolerated treatment well Patient left: in bed;with call bell/phone within reach;in CPM     Time: 1610-9604 PT Time Calculation (min) (ACUTE ONLY): 50 min  Charges:  $Gait Training: 23-37 mins $Therapeutic Exercise: 8-22 mins                    G  Codes:      Delorse Lek 2015-08-05, 10:09 AM Delaney Meigs, PT [phone]

## 2015-07-22 NOTE — Progress Notes (Signed)
Physical therapy Progress Note Jessica Landry did very well with ambulation in hall this afternoon and continues to improve with HEP. All goals for safe return home met. Will continue to follow   07/22/15 1317  PT Visit Information  Last PT Received On 07/22/15  Assistance Needed +1  History of Present Illness 60 yo admitted for bil TKA. PMHx: anemia, anxiety, HTN  PT Time Calculation  PT Start Time (ACUTE ONLY) 1253  PT Stop Time (ACUTE ONLY) 1326  PT Time Calculation (min) (ACUTE ONLY) 33 min  Precautions  Precautions Knee;Fall  Restrictions  RLE Weight Bearing WBAT  LLE Weight Bearing WBAT  Pain Assessment  Pain Score 4  Pain Location bil knees  Pain Intervention(s) Limited activity within patient's tolerance;Monitored during session;Premedicated before session;Repositioned  Cognition  Arousal/Alertness Awake/alert  Behavior During Therapy WFL for tasks assessed/performed  Overall Cognitive Status Within Functional Limits for tasks assessed  Bed Mobility  Overal bed mobility Modified Independent  Transfers  Overall transfer level Needs assistance  Transfers Sit to/from Stand  Sit to Stand Min assist  General transfer comment cues for hand placement, anterior translation and rise from surface from bed and recliner  Ambulation/Gait  Ambulation/Gait assistance Min guard  Ambulation Distance (Feet) 300 Feet  Assistive device Rolling walker (2 wheeled)  Gait Pattern/deviations Step-through pattern;Decreased stride length;Trunk flexed  General Gait Details cues for posture. position in RW, to relax shoulders, chair to follow. 83' then 115' after seated rest  Gait velocity interpretation Below normal speed for age/gender  Total Joint Exercises  Heel Slides AROM;Both;15 reps;Supine  Straight Leg Raises Supine;15 reps;Both;AROM  Long Arc Quad AROM;Seated;Both;15 reps  PT - End of Session  Equipment Utilized During Treatment Gait belt  Activity Tolerance Patient tolerated treatment well   Patient left in bed;with call bell/phone within reach  PT - Assessment/Plan  PT Plan Current plan remains appropriate  Follow Up Recommendations Home health PT;Supervision - Intermittent  PT equipment 3in1 (PT);Rolling walker with 5" wheels  PT Goal Progression  Progress towards PT goals Progressing toward goals  PT General Charges  $$ ACUTE PT VISIT 1 Procedure  PT Treatments  $Gait Training 8-22 mins  $Therapeutic Exercise 8-22 mins  [address] [place]

## 2015-07-22 NOTE — Progress Notes (Signed)
Subjective: 2 Days Post-Op Procedure(s) (LRB): TOTAL KNEE BILATERAL (Bilateral) Patient reports pain as moderate.  No nausea/vomiting, lightheadedness/dizziness, chest pain/sob.    Objective: Vital signs in last 24 hours: Temp:  [98.9 F (37.2 C)-100.5 F (38.1 C)] 100.5 F (38.1 C) (04/14 1015) Pulse Rate:  [67-95] 86 (04/14 1015) Resp:  [17-18] 17 (04/14 1015) BP: (77-111)/(44-62) 99/44 mmHg (04/14 1015) SpO2:  [90 %-99 %] 99 % (04/14 1015)  Intake/Output from previous day: 04/13 0701 - 04/14 0700 In: 1168.3 [P.O.:720; I.V.:448.3] Out: -  Intake/Output this shift:     Recent Labs  07/21/15 0540 07/22/15 0450  HGB 9.8* 8.6*    Recent Labs  07/21/15 0540 07/22/15 0450  WBC 4.5 6.1  RBC 3.22* 2.89*  HCT 29.7* 26.6*  PLT 197 193    Recent Labs  07/21/15 0540 07/22/15 0450  NA 137 134*  K 4.1 3.8  CL 101 99*  CO2 26 25  BUN 11 12  CREATININE 0.91 0.99  GLUCOSE 104* 102*  CALCIUM 8.3* 8.2*   No results for input(s): LABPT, INR in the last 72 hours.  Neurologically intact Neurovascular intact Sensation intact distally Intact pulses distally Dorsiflexion/Plantar flexion intact Incision: dressing C/D/I No cellulitis present Compartment soft    Assessment/Plan: 2 Days Post-Op Procedure(s) (LRB): TOTAL KNEE BILATERAL (Bilateral) Advance diet Up with therapy Discharge home with home health tomorrow WBAT BLE ABLA-mild and stable  Dry dressing change prn  Otilio SaberM Lindsey Stanbery 07/22/2015, 10:30 AM

## 2015-07-22 NOTE — Anesthesia Postprocedure Evaluation (Signed)
Anesthesia Post Note  Patient: Jessica BrookingSarah A Force  Procedure(s) Performed: Procedure(s) (LRB): TOTAL KNEE BILATERAL (Bilateral)  Patient location during evaluation: PACU Anesthesia Type: Spinal and MAC Level of consciousness: awake and alert Pain management: pain level controlled Vital Signs Assessment: post-procedure vital signs reviewed and stable Respiratory status: spontaneous breathing and respiratory function stable Cardiovascular status: blood pressure returned to baseline and stable Postop Assessment: spinal receding Anesthetic complications: no    Last Vitals:  Filed Vitals:   07/21/15 2100 07/22/15 0500  BP: 77/51 111/62  Pulse: 67 95  Temp: 37.2 C 37.9 C  Resp: 18 18    Last Pain:  Filed Vitals:   07/22/15 0550  PainSc: 10-Worst pain ever                 Kennieth RadFitzgerald, [name]

## 2015-07-22 NOTE — Progress Notes (Signed)
Orthopedic Tech Progress Note Patient Details:  Elizebeth BrookingSarah A Misiaszek 05/04/1955 [id number]  CPM Left Knee CPM Left Knee: On Left Knee Flexion (Degrees): 70 Left Knee Extension (Degrees): 0 Additional Comments: 0 degree bone foam applied CPM Right Knee CPM Right Knee: On Right Knee Flexion (Degrees): 70 Right Knee Extension (Degrees): 0 Additional Comments: 0 degree bone foam applied   Saul FordyceJennifer C Oluwateniola Leitch 07/22/2015, 1:49 PM

## 2015-07-22 NOTE — Progress Notes (Signed)
Occupational Therapy Treatment Patient Details Name: Jessica BrookingSarah A Landry MRN: [id number] DOB: 10/20/1955 Today's Date: 07/22/2015    History of present illness 60 yo admitted for bil TKA. PMHx: anemia, anxiety, HTN   OT comments  Pt making good progress toward OT goals; feels her only problem currently is pain management, pain meds given during session by RN. Pt currently supervision for safety with LB ADLs. Educated pt on walk in shower transfer with use of RW; pt able to return demo with supervision for safety. Pt with good safety awareness and knowledge of compensatory strategies. D/c plan remains appropriate. Will continue to follow acutely.   Follow Up Recommendations  No OT follow up;Supervision/Assistance - 24 hour    Equipment Recommendations  3 in 1 bedside comode    Recommendations for Other Services      Precautions / Restrictions Precautions Precautions: Knee;Fall Restrictions Weight Bearing Restrictions: Yes RLE Weight Bearing: Weight bearing as tolerated LLE Weight Bearing: Weight bearing as tolerated       Mobility Bed Mobility Overal bed mobility: Modified Independent                Transfers Overall transfer level: Needs assistance Equipment used: Rolling walker (2 wheeled) Transfers: Sit to/from Stand Sit to Stand: Supervision         General transfer comment: Supervision for safety. Good hand placement and technique. No LOB noted. Sit to stand from EOB x 1.    Balance Overall balance assessment: Needs assistance Sitting-balance support: Feet supported;No upper extremity supported Sitting balance-Leahy Scale: Good     Standing balance support: Bilateral upper extremity supported;During functional activity Standing balance-Leahy Scale: Poor                     ADL Overall ADL's : Needs assistance/impaired                     Lower Body Dressing: Supervision/safety;Sit to/from stand Lower Body Dressing Details (indicate cue  type and reason): Pt able to don/doff bilateral socks. Educated on use of reacher for assist with donning LB clothing         Tub/ Shower Transfer: Supervision/safety;Walk-in shower;Ambulation;Shower Dealerseat;Rolling walker Tub/Shower Transfer Details (indicate cue type and reason): Educated on walk in shower transfer technique with RW; pt able to return demo with supervision for safety. Educated pt on need for supervision during shower transfers; pt reports husband will be available as needed. Functional mobility during ADLs: Supervision/safety;Rolling walker General ADL Comments: Pt with good safety awareness and knowledge of compensatory strategies. Reports she was compensating for pain in bil knees PTA so she is used to doing things differently.      Vision                     Perception     Praxis      Cognition   Behavior During Therapy: WFL for tasks assessed/performed Overall Cognitive Status: Within Functional Limits for tasks assessed                       Extremity/Trunk Assessment               Exercises     Shoulder Instructions       General Comments      Pertinent Vitals/ Pain       Pain Assessment: Faces Faces Pain Scale: Hurts whole lot Pain Location: bilateral knees Pain Descriptors / Indicators: Aching;Sore;Throbbing Pain Intervention(s):  Limited activity within patient's tolerance;Monitored during session;Repositioned;Patient requesting pain meds-RN notified;RN gave pain meds during session;Ice applied  Home Living                                          Prior Functioning/Environment              Frequency Min 2X/week     Progress Toward Goals  OT Goals(current goals can now be found in the care plan section)  Progress towards OT goals: Progressing toward goals  Acute Rehab OT Goals Patient Stated Goal: to get back to playing with my dogs OT Goal Formulation: With patient  Plan Discharge plan  remains appropriate    Co-evaluation                 End of Session Equipment Utilized During Treatment: Rolling walker;Gait belt   Activity Tolerance Patient tolerated treatment well   Patient Left in chair;with call bell/phone within reach;with nursing/sitter in room   Nurse Communication Patient requests pain meds        Time: 2440-1027 OT Time Calculation (min): 20 min  Charges: OT General Charges $OT Visit: 1 Procedure OT Treatments $Self Care/Home Management : 8-22 mins  Gaye Alken M.S., OTR/L Pager: [phone]  07/22/2015, 8:48 AM

## 2015-07-22 NOTE — Progress Notes (Signed)
Orthopedic Tech Progress Note Patient Details:  Jessica BrookingSarah A Landry 12/03/1955 [id number]  Patient ID: Jessica Landry, female   DOB: 01/13/1956, 60 y.o.   MRN: [id number] Pt was in to much pain to get into cpms. Will call when ready.   Trinna PostMartinez, Onyx Edgley J 07/22/2015, 7:41 AM

## 2015-07-22 NOTE — Care Management (Signed)
Patient states they thought they had a rolling walker but do not. Case manager contacted Kipp BroodBrent with [employer] and requested rolling walker and 3in1 for patient. He will deliver to her room.

## 2015-07-23 LAB — CBC
HEMATOCRIT: 26.2 % — AB (ref 36.0–46.0)
Hemoglobin: 8.4 g/dL — ABNORMAL LOW (ref 12.0–15.0)
MCH: 29.2 pg (ref 26.0–34.0)
MCHC: 32.1 g/dL (ref 30.0–36.0)
MCV: 91 fL (ref 78.0–100.0)
PLATELETS: 163 10*3/uL (ref 150–400)
RBC: 2.88 MIL/uL — AB (ref 3.87–5.11)
RDW: 14.2 % (ref 11.5–15.5)
WBC: 5.1 10*3/uL (ref 4.0–10.5)

## 2015-07-23 LAB — TYPE AND SCREEN
ABO/RH(D): A POS
Antibody Screen: NEGATIVE
Unit division: 0

## 2015-07-23 LAB — BASIC METABOLIC PANEL
Anion gap: 9 (ref 5–15)
BUN: 6 mg/dL (ref 6–20)
CO2: 25 mmol/L (ref 22–32)
Calcium: 8.3 mg/dL — ABNORMAL LOW (ref 8.9–10.3)
Chloride: 102 mmol/L (ref 101–111)
Creatinine, Ser: 0.82 mg/dL (ref 0.44–1.00)
GFR calc Af Amer: >60 mL/min (ref >60)
GLUCOSE: 106 mg/dL — AB (ref 65–99)
POTASSIUM: 3.7 mmol/L (ref 3.5–5.1)
Sodium: 136 mmol/L (ref 135–145)

## 2015-07-23 LAB — PREPARE RBC (CROSSMATCH)

## 2015-07-23 MED ORDER — NALOXONE HCL 0.4 MG/ML IJ SOLN
0.4000 mg | INTRAMUSCULAR | Status: DC | PRN
  Administered 2015-07-23: 0.1 mg via INTRAVENOUS
  Filled 2015-07-23: qty 1

## 2015-07-23 NOTE — Progress Notes (Signed)
Physical Therapy Treatment Patient Details Name: Jessica Landry MRN: [id number] DOB: Nov 04, 1955 Today's Date: 07/23/2015    History of Present Illness 60 yo admitted for bil TKA. PMHx: anemia, anxiety, HTN    PT Comments    Pt performed stair training with multiple technique remains to require mod assist for stair negotiation.  Pt will require ambulance transport at d/c for safe entry into home.  Pt lethargic during intervention.  RN notified of need for ambulance transport.  Pt set for d/c home today.    Follow Up Recommendations  Home health PT;Supervision - Intermittent;Other (comment) (ambulance transport for safe entry into home.  )     Equipment Recommendations  3in1 (PT);Rolling walker with 5" wheels    Recommendations for Other Services       Precautions / Restrictions Precautions Precautions: Knee;Fall Restrictions Weight Bearing Restrictions: Yes RLE Weight Bearing: Weight bearing as tolerated LLE Weight Bearing: Weight bearing as tolerated    Mobility  Bed Mobility Overal bed mobility: Modified Independent             General bed mobility comments: Pt performed without assistance but required increased time due to pain and patient's lethargic presentation.    Transfers Overall transfer level: Needs assistance Equipment used: Rolling walker (2 wheeled) Transfers: Sit to/from Stand Sit to Stand: Min assist;Mod assist (required mod assist from low seated bench in hall with out arm rests.  Pt min assist from all other surfaces.  Required increased time to complete transfers.  )         General transfer comment: cues for hand placement, anterior translation and rise from surface from bed and recliner.  Pt performed transfer from edge of bed, BSC, bench without arms.    Ambulation/Gait Ambulation/Gait assistance: Min guard Ambulation Distance (Feet): 200 Feet (x2 trials with seated rest break between trials.  ) Assistive device: Rolling walker (2  wheeled) Gait Pattern/deviations: Step-through pattern;Decreased stride length;Trunk flexed;Antalgic     General Gait Details: cues for posture. position in RW, to relax shoulders, Pt lethargic requiring cues to keep eyes open.  Pt ambulates at varying speeds.     Stairs Stairs: Yes Stairs assistance: Mod assist Stair Management: One rail Right;No rails;Backwards;With walker (Performed forward with R rail and backwards without rails and use of RW.  Pt required increased assist and will require ambulance transport at d/c for safe entry into home.  Will inform supervising PT.  ) Number of Stairs: 2 General stair comments: cues for sequence, safety and assist for RW management.  Required increased assist in comparison to mobility.    Wheelchair Mobility    Modified Rankin (Stroke Patients Only)       Balance Overall balance assessment: Needs assistance Sitting-balance support: Feet supported;No upper extremity supported Sitting balance-Leahy Scale: Good       Standing balance-Leahy Scale: Fair Standing balance comment: Remains reliant on RW for support.                      Cognition Arousal/Alertness: Awake/alert Behavior During Therapy: WFL for tasks assessed/performed Overall Cognitive Status: Within Functional Limits for tasks assessed                      Exercises Total Joint Exercises Ankle Circles/Pumps: AROM;Both;10 reps Quad Sets: AROM;Both;10 reps Gluteal Sets: AROM;Both;10 reps Heel Slides: AAROM;Both;10 reps Hip ABduction/ADduction: AAROM;Both;10 reps Straight Leg Raises: AAROM;Both;10 reps Long Arc Quad: AROM;Both;10 reps    General Comments  Pertinent Vitals/Pain Pain Assessment: 0-10 Pain Score: 7  Pain Descriptors / Indicators: Grimacing;Guarding;Sore;Tightness Pain Intervention(s): Monitored during session;Repositioned;Premedicated before session;Ice applied    Home Living                      Prior Function             PT Goals (current goals can now be found in the care plan section) Acute Rehab PT Goals Patient Stated Goal: to get back to playing with my dogs Potential to Achieve Goals: Good Progress towards PT goals: Progressing toward goals    Frequency  7X/week    PT Plan Discharge plan needs to be updated    Co-evaluation             End of Session Equipment Utilized During Treatment: Gait belt Activity Tolerance: Patient tolerated treatment well Patient left: in bed;with call bell/phone within reach     Time: 0942-1035 PT Time Calculation (min) (ACUTE ONLY): 53 min  Charges:  $Gait Training: 23-37 mins $Therapeutic Exercise: 8-22 mins $Therapeutic Activity: 8-22 mins                    G Codes:      Florestine Aversimee J Ivery Michalski 07/23/2015, 10:46 AM  Joycelyn RuaAimee Noel Henandez, PTA pager [phone]

## 2015-07-23 NOTE — Progress Notes (Signed)
CM went to unit to arrange PTAR; RN states pt won't be discharging today.  CM will revisit tomorrow.

## 2015-07-23 NOTE — Progress Notes (Signed)
Pt pre PRBC transfusion temperature is 100.6. Notified Jari SportsmanMary Stanbery, [place]. Instructed to give Tylenol 650mg  po as ordered and continue with blood transfusion. Will continue to monitor.

## 2015-07-23 NOTE — Progress Notes (Signed)
Orthopedic Tech Progress Note Patient Details:  Elizebeth BrookingSarah A Minteer 05/19/1955 [id number] Patient ID: Elizebeth BrookingSarah A Morino, female   DOB: 01/21/1956, 60 y.o.   MRN: [id number]   Jennye MoccasinHughes, Marea Reasner Craig 07/23/2015, 4:10 PM Ortho visit on cpm at 1610

## 2015-07-24 NOTE — Progress Notes (Signed)
Subjective: 4 Days Post-Op Procedure(s) (LRB): TOTAL KNEE BILATERAL (Bilateral) Patient reports pain as 2 on 0-10 scale.    Objective: Vital signs in last 24 hours: Temp:  [98.5 F (36.9 C)-99.7 F (37.6 C)] 98.5 F (36.9 C) (04/16 0458) Pulse Rate:  [85-100] 87 (04/16 0458) Resp:  [16] 16 (04/16 0458) BP: (99-122)/(44-65) 107/60 mmHg (04/16 0458) SpO2:  [92 %-98 %] 98 % (04/16 0458)  Intake/Output from previous day: 04/15 0701 - 04/16 0700 In: 745 [P.O.:195; I.V.:550] Out: 900 [Urine:900] Intake/Output this shift: Total I/O In: 120 [P.O.:120] Out: 400 [Urine:400]   Recent Labs  07/22/15 0450 07/23/15 0315  HGB 8.6* 8.4*    Recent Labs  07/22/15 0450 07/23/15 0315  WBC 6.1 5.1  RBC 2.89* 2.88*  HCT 26.6* 26.2*  PLT 193 163    Recent Labs  07/22/15 0450 07/23/15 0315  NA 134* 136  K 3.8 3.7  CL 99* 102  CO2 25 25  BUN 12 6  CREATININE 0.99 0.82  GLUCOSE 102* 106*  CALCIUM 8.2* 8.3*   No results for input(s): LABPT, INR in the last 72 hours.  ABD soft Neurovascular intact Sensation intact distally Intact pulses distally Dorsiflexion/Plantar flexion intact Incision: scant drainage  Assessment/Plan: 4 Days Post-Op Procedure(s) (LRB): TOTAL KNEE BILATERAL (Bilateral)  Active Problems:   Status post total bilateral knee replacement Patient was over sedated yesterday.  She was given a dose of Narcan and Oxycontin was discontinued.  Today she is much more alert with AROM 0-90.  Ready to go home. Up with therapy Discharge home with home health  Jessica Landry,Jessica Landry 07/24/2015, 9:48 AM

## 2015-07-24 NOTE — Progress Notes (Addendum)
Occupational Therapy Treatment Patient Details Name: Jessica Landry MRN: [id number] DOB: [birth year] Today's Date: 07/24/2015    History of present illness 60 y.o. admitted for bil TKA. PMHx: anemia, anxiety, HTN   OT comments  Pt safe for d/c home, from OT standpoint. OT signing off.  Follow Up Recommendations  No OT follow up;Supervision - Intermittent    Equipment Recommendations  3 in 1 bedside comode    Recommendations for Other Services      Precautions / Restrictions Precautions Precautions: Knee;Fall Precaution Booklet Issued: No Precaution Comments: educated on knee precautions Restrictions Weight Bearing Restrictions: Yes RLE Weight Bearing: Weight bearing as tolerated LLE Weight Bearing: Weight bearing as tolerated       Mobility Bed Mobility Overal bed mobility: Modified Independent (supine to sit)                Transfers Overall transfer level: Needs assistance Equipment used: Rolling walker (2 wheeled) Transfers: Sit to/from Stand Sit to Stand: Min guard              Balance      Ambulated in room with supervision and set up for RW prior to stand.                             ADL Overall ADL's : Needs assistance/impaired                     Lower Body Dressing: Sitting/lateral leans;Modified independent Lower Body Dressing Details (indicate cue type and reason): donned/doffed socks Toilet Transfer: Min guard;Ambulation;RW (sit to stand from bed) Toilet Transfer Details (indicate cue type and reason): Min guard for sit to stand; Supervision for ambulation and set up for RW         Functional mobility during ADLs: Rolling walker (Min guard for sit to stand; Supervision for ambulation with RW and set up for RW prior to stand) General ADL Comments: Educated on benefit of reaching down to feet to manage socks as it allows knees to bend. Educated on safety such as pets, sitting for LB ADLs, safe footwear, recommended  someone be with her for shower transfer and bathing, rugs/items on floor. Discussed using 3 in 1 as shower chair.      Vision                     Perception     Praxis      Cognition  Awake/Alert Behavior During Therapy: WFL for tasks assessed/performed Overall Cognitive Status: Within Functional Limits for tasks assessed                       Extremity/Trunk Assessment               Exercises     Shoulder Instructions       General Comments      Pertinent Vitals/ Pain       Pain Assessment: 0-10 Pain Score: 2  Pain Location: bilateral knees Pain Descriptors / Indicators: Other (Comment) (aggravating) Pain Intervention(s): Monitored during session  Home Living                                          Prior Functioning/Environment              Frequency Min 2X/week  Progress Toward Goals  OT Goals(current goals can now be found in the care plan section)  Progress towards OT goals: Progressing toward goals (adequate for d/c)  Acute Rehab OT Goals Patient Stated Goal: walking OT Goal Formulation: With patient Time For Goal Achievement: 08/04/15 Potential to Achieve Goals: Good ADL Goals Pt Will Perform Lower Body Bathing: with supervision;sit to/from stand Pt Will Perform Lower Body Dressing: with supervision;sit to/from stand Pt Will Transfer to Toilet: with supervision;ambulating;bedside commode (over toilet) Pt Will Perform Toileting - Clothing Manipulation and hygiene: with supervision;sitting/lateral leans;sit to/from stand Pt Will Perform Tub/Shower Transfer: Shower transfer;with supervision;ambulating;3 in 1;rolling walker (looks like she met this goal last session)  Plan Discharge plan needs to be updated    Co-evaluation                 End of Session Equipment Utilized During Treatment: Gait belt;Rolling walker   Activity Tolerance Patient tolerated treatment well   Patient Left in  chair;with call bell/phone within reach   Nurse Communication Mobility status        Time: 5973-3125 OT Time Calculation (min): 14 min  Charges: OT General Charges $OT Visit: 1 Procedure OT Treatments $Self Care/Home Management : 8-22 mins   Benito Mccreedy OTR/L [phone] 07/24/2015, 10:46 AM

## 2015-07-24 NOTE — Progress Notes (Signed)
Physical Therapy Treatment Patient Details Name: Jessica Landry MRN: [id number] DOB: [birth year] Today's Date: 07/24/2015    History of Present Illness 60 y.o. admitted for bil TKA. PMHx: anemia, anxiety, HTN    PT Comments    Patient is making good progress with PT.  From a mobility standpoint anticipate patient will be ready for DC home with assist from husband.     Follow Up Recommendations  Home health PT;Supervision - Intermittent;Other (comment)     Equipment Recommendations  3in1 (PT);Rolling walker with 5" wheels    Recommendations for Other Services OT consult     Precautions / Restrictions Precautions Precautions: Knee;Fall Precaution Booklet Issued: No Precaution Comments: educated on knee precautions Restrictions Weight Bearing Restrictions: Yes RLE Weight Bearing: Weight bearing as tolerated LLE Weight Bearing: Weight bearing as tolerated    Mobility  Bed Mobility Overal bed mobility: Modified Independent (supine to sit)             General bed mobility comments: OOB in chair upon arrival  Transfers Overall transfer level: Needs assistance Equipment used: Rolling walker (2 wheeled) Transfers: Sit to/from Stand Sit to Stand: Supervision         General transfer comment: supervision for safety; good technique and safe hand placement  Ambulation/Gait Ambulation/Gait assistance: Supervision Ambulation Distance (Feet): 150 Feet Assistive device: Rolling walker (2 wheeled) Gait Pattern/deviations: Step-through pattern;Decreased stride length;Trunk flexed     General Gait Details: safe use of AD; min cues for posture; slow and steady gait   Stairs Stairs: Yes Stairs assistance: Min assist Stair Management: No rails;Backwards;With walker Number of Stairs: 4 General stair comments: cues for sequencing and technique with some carry over demonstrated; min A to stabilize RW; good safety awareness; increased time needed but no  unsteadiness  Wheelchair Mobility    Modified Rankin (Stroke Patients Only)       Balance     Sitting balance-Leahy Scale: Good       Standing balance-Leahy Scale: Fair                      Cognition Arousal/Alertness: Awake/alert Behavior During Therapy: WFL for tasks assessed/performed Overall Cognitive Status: Within Functional Limits for tasks assessed                      Exercises Total Joint Exercises Quad Sets: AROM;Both;15 reps;Seated Heel Slides: AROM;Both;10 reps;Seated Goniometric ROM: 0-92    General Comments General comments (skin integrity, edema, etc.): educated pt and husband on stair management, positioning, use of ice and zero degree foam and reviewed handouts      Pertinent Vitals/Pain Pain Assessment: 0-10 Pain Score: 2  Pain Location: Bilat knees Pain Descriptors / Indicators: Sore Pain Intervention(s): Monitored during session;Premedicated before session;Repositioned    Home Living                      Prior Function            PT Goals (current goals can now be found in the care plan section) Acute Rehab PT Goals Patient Stated Goal: go home Progress towards PT goals: Progressing toward goals    Frequency  7X/week    PT Plan Current plan remains appropriate    Co-evaluation             End of Session Equipment Utilized During Treatment: Gait belt Activity Tolerance: Patient tolerated treatment well Patient left: in chair;with call bell/phone within reach;with family/visitor present  Time: 8119-14781104-1138 PT Time Calculation (min) (ACUTE ONLY): 34 min  Charges:  $Gait Training: 8-22 mins $Therapeutic Exercise: 8-22 mins                    G Codes:      Derek MoundKellyn R Maurisio Ruddy Edyth Glomb, PTA Pager: [phone]   07/24/2015, 12:12 PM

## 2015-07-24 NOTE — Progress Notes (Signed)
This shift trying to manage pain but do not want to over medicate since patient had to receive Narcan yesterday during the day shift.  Patient's pain has been managed with Tylenol PO and Oxy IR 5 mg with Robaxin 500 mg PO.

## 2015-07-24 NOTE — Progress Notes (Signed)
Discharge instructions given. Pt verbalized understanding and all questions were answered.  

## 2015-08-23 ENCOUNTER — Ambulatory Visit: Payer: Self-pay | Admitting: Psychiatry

## 2015-09-01 ENCOUNTER — Encounter: Payer: Self-pay | Admitting: Psychiatry

## 2015-09-01 ENCOUNTER — Ambulatory Visit (INDEPENDENT_AMBULATORY_CARE_PROVIDER_SITE_OTHER): Payer: Self-pay | Admitting: Psychiatry

## 2015-09-01 VITALS — BP 122/72 | HR 72 | Temp 97.4°F | Ht 67.0 in | Wt 159.0 lb

## 2015-09-01 DIAGNOSIS — F331 Major depressive disorder, recurrent, moderate: Secondary | ICD-10-CM

## 2015-09-01 MED ORDER — BUPROPION HCL ER (XL) 300 MG PO TB24: 300.0000 mg | ORAL_TABLET | ORAL | Status: DC

## 2015-09-01 MED ORDER — TRAZODONE HCL 50 MG PO TABS: 50.0000 mg | ORAL_TABLET | Freq: Every day | ORAL | Status: DC

## 2015-09-01 MED ORDER — FLUOXETINE HCL 20 MG PO CAPS: 20.0000 mg | ORAL_CAPSULE | Freq: Every day | ORAL | Status: DC

## 2015-09-01 NOTE — Progress Notes (Signed)
Patient ID: Jessica Landry, female   DOB: 12/26/1955, 60 y.o.   MRN: [id number]   [facility] Note  09/01/2015 1:40 PM Jessica Landry  MRN:  [id number]  Subjective:  Patient returns for follow-up of her major depressive disorder, recurrent moderate. Patient reports doing much better overall mood wise. She reports that she never started taking the second dose of Prozac in the afternoon. She's just been taking the 20 mg in the morning. Patient has had both her knees replaced and reports doing quite well. She had her surgery 6 weeks ago and states it went extremely well. She comes in walking into the office and is very happy. States that her mood is great and she is looking forward to being more mobile. States she is having some trouble sleeping. Getting fatigued little easily and attributes it to the surgery. Denies any anxiety. She is very excited about getting use of her knees back. Denies any suicidal thoughts.   Chief Complaint: Doing great Chief Complaint    Follow-up; Medication Refill     Visit Diagnosis:     ICD-9-CM ICD-10-CM   1. Major depressive disorder, recurrent episode, moderate (HCC) 296.32 F33.1     Past Medical History:  Past Medical History  Diagnosis Date  . ST-segment elevation myocardial infarction (STEMI) of inferior wall (HCC) 03/07/2011    RCA Occlusion --> PCI of mRCA; ECHO 03/2011: EF > 55%, MIld Inferior HK, Mild AoV Sclerosis.  Marland Kitchen. CAD S/P percutaneous coronary angioplasty 03/07/2011    a) PCI of mRCA with Promus Element DES 3.5 mm x 28 mm (4.2 -- 3.7 mm); b) Myoview 04/2011: EF 81% (small LV Cavity), fixed basal-mid Inferior Infarct, No Ischemia, 10 METS  . Essential hypertension 03/07/2011  . Hyperlipidemia with target LDL less than 70 2012     Statin Intolerance (tried Lipitor & Crestor)  . History of back surgery   . GERD (gastroesophageal reflux disease)   . Anxiety   . Iron deficiency anemia 03/09/2011  . Insomnia     without significant  sleeping disorders  . Neuropathy (HCC)   . Vocal cord polyp 2005  . Depression 03/08/2011    [place]  . Headache     chocolate & red wine triggers /w bad headaches   . Complication of anesthesia   . PONV (postoperative nausea and vomiting)   . Family history of adverse reaction to anesthesia   . Arthritis     knees & back     Past Surgical History  Procedure Laterality Date  . Abdominal hysterectomy    . Doppler echocardiography  03/27/2011    LV cavity is small,EF =>55%,MILD INFERIOR HYPOKINESIS; Mild Aortic Sclerosis  . Nm myocar perf wall motion  04/20/2011    EF 81% (due to small LVcavity),fixed basal to mid inferior INFARCT - NO ISCHEMIA; 10 METs  . Coronary angioplasty with stent placement  03/07/2011    inferior STEMI - RCA PROMUS 3.5 mm x 28 mm DES  (post Dil 3.7 distal to 4.2 prox)  . Cardiac catheterization  03/08/2011    improved flow from the PCI  . Left heart catheterization with coronary angiogram N/A 03/07/2011    Procedure: LEFT HEART CATHETERIZATION WITH CORONARY ANGIOGRAM;  Surgeon: Marykay Lexavid W Harding, MD;  [facility];  Service: Cardiovascular;  Laterality: N/A;  . Left heart catheterization with coronary angiogram N/A 03/08/2011    Procedure: LEFT HEART CATHETERIZATION WITH CORONARY ANGIOGRAM;  Surgeon: Marykay Lexavid W Harding, MD;  Location: [facility] CATH LAB;  Service: Cardiovascular;  Laterality: N/A;  . Knee surgery Left 2015  . Knee surgery Right 2010    Baker cyst  . Dental surgery  2016  . Upper gi endoscopy  02-22-03    Dr. Lemar Livings, normal  . Breast biopsy Right 01/31/1999    fibrocystic changes, ductal adenosis, focal atypical ductal epithelium.  . Cholecystectomy  03/25/1998    Dr. Lemar Livings, chronic cholecystitis.  . Skin nodule Left 09/11/1999    dermatofibroma left lower leg, positive lateral margin.  Marland Kitchen Appendectomy  07/04/2000    Normal appendix, adhesions noted.  . Colonoscopy with propofol N/A 09/28/2014    Procedure:  COLONOSCOPY WITH PROPOFOL;  Surgeon: Earline Mayotte, MD;  Location: [facility] ENDOSCOPY;  Service: Endoscopy;  Laterality: N/A;  . Esophagogastroduodenoscopy N/A 09/28/2014    Procedure: ESOPHAGOGASTRODUODENOSCOPY (EGD);  Surgeon: Earline Mayotte, MD;  Location: [facility] ENDOSCOPY;  Service: Endoscopy;  Laterality: N/A;  . Back surgery      cyst on spinal cord - lumbar  . Eye surgery      blepheroplasty- bilateral  . Total knee arthroplasty Bilateral 07/20/2015    Procedure: TOTAL KNEE BILATERAL;  Surgeon: Loreta Ave, MD;  Location: [facility] OR;  Service: Orthopedics;  Laterality: Bilateral;  . Joint replacement     Family History:  Family History  Problem Relation Age of Onset  . Heart attack Mother   . Coronary artery disease Mother   . Stroke Mother   . Depression Mother   . Anxiety disorder Mother   . Alzheimer's disease Father   . Stroke Father   . Breast cancer Sister 55  . Depression Brother   . Drug abuse Brother   . Bipolar disorder Sister    Social History:  Social History   Social History  . Marital Status: Married    Spouse Name: N/A  . Number of Children: N/A  . Years of Education: N/A   Social History Main Topics  . Smoking status: Former Smoker -- 0.25 packs/day for 40 years    Types: Cigarettes    Quit date: 03/07/2011  . Smokeless tobacco: Never Used  . Alcohol Use: No  . Drug Use: No  . Sexual Activity: Yes   Other Topics Concern  . None   Social History Narrative   Divorced woman -- Now Remarried to her long-term partner.   Exercises routinely on a daily basis roughly 45 minutes a day walking.  Quit smoking at the time of her MI. Does not drink.   Additional History:   Assessment:   Musculoskeletal: Strength & Muscle Tone: within normal limits Gait & Station: normal Patient leans: N/A  Psychiatric Specialty Exam: Depression        Associated symptoms include does not have insomnia and no suicidal ideas.   Review of Systems   Psychiatric/Behavioral: Positive for depression. Negative for suicidal ideas, hallucinations, memory loss and substance abuse. The patient is not nervous/anxious and does not have insomnia.   All other systems reviewed and are negative.   Blood pressure 122/72, pulse 72, temperature 97.4 F (36.3 C), temperature source Tympanic, height 5\' 7"  (1.702 m), weight 159 lb (72.122 kg), SpO2 98 %.Body mass index is 24.9 kg/(m^2).  General Appearance: Neat and Well Groomed  Eye Contact:  Good  Speech:  Normal Rate  Volume:  Normal  Mood:  Much improved   Affect:  Smiling   Thought Process:  Linear  Orientation:  Full (Time, Place, and Person)  Thought  Content:  Negative  Suicidal Thoughts:  No  Homicidal Thoughts:  No  Memory:  Immediate;   Good Recent;   Good Remote;   Good  Judgement:  Good  Insight:  Good  Psychomotor Activity:  Negative  Concentration:  Good  Recall:  Good  Fund of Knowledge: Negative  Language: Good  Akathisia:  Negative  Handed:  Right unknown   AIMS (if indicated):  NA  Assets:  Communication Skills Desire for Improvement  ADL's:  Intact  Cognition: WNL  Sleep:  good   Is the patient at risk to self?  No. Has the patient been a risk to self in the past 6 months?  No. Has the patient been a risk to self within the distant past?  No. Is the patient a risk to others?  No. Has the patient been a risk to others in the past 6 months?  No. Has the patient been a risk to others within the distant past?  No.  Current Medications: Current Outpatient Prescriptions  Medication Sig Dispense Refill  . benazepril (LOTENSIN) 5 MG tablet Take 1 tablet (5 mg total) by mouth daily. (Patient taking differently: Take 5 mg by mouth daily before breakfast. ) 90 tablet 3  . bisacodyl (DULCOLAX) 5 MG EC tablet Take 1 tablet (5 mg total) by mouth daily as needed for moderate constipation. 30 tablet 0  . buPROPion (WELLBUTRIN XL) 300 MG 24 hr tablet Take 1 tablet (300 mg total)  by mouth daily. (Patient taking differently: Take 300 mg by mouth daily before breakfast. ) 30 tablet 4  . clopidogrel (PLAVIX) 75 MG tablet Take 75 mg by mouth daily.    Marland Kitchen FLUoxetine (PROZAC) 20 MG capsule Take 1 capsule (20 mg total) by mouth 2 (two) times daily. (Patient taking differently: Take 20 mg by mouth daily before breakfast. ) 180 capsule 1  . gabapentin (NEURONTIN) 300 MG capsule TAKE 2 CAPSULES EVERY NIGHT 180 capsule 2  . methocarbamol (ROBAXIN) 500 MG tablet Take 1 tablet (500 mg total) by mouth 4 (four) times daily. 90 tablet 0  . metoprolol (LOPRESSOR) 50 MG tablet Take 1 tablet (50 mg total) by mouth 2 (two) times daily. 180 tablet 3  . nitroGLYCERIN (NITROSTAT) 0.4 MG SL tablet Place 1 tablet (0.4 mg total) under the tongue every 5 (five) minutes as needed for chest pain (up to 3 tabs in 15 mins and then call 911). 25 tablet 1  . omeprazole (PRILOSEC OTC) 20 MG tablet Take 20 mg by mouth daily before breakfast.     . oxyCODONE-acetaminophen (PERCOCET) 7.5-325 MG tablet Take 1-2 tabs po q4-6 hours prn pain 60 tablet 0   No current facility-administered medications for this visit.    Medical Decision Making:  Established Problem, Stable/Improving (1) and Review of Medication Regimen & Side Effects (2)  Treatment Plan Summary:Medication management and Plan   Major depressive disorder, recurrent, moderate- We will continue Wellbutrin XL 300 mg daily. Continue Prozac at  in the morning.  Insomnia Start trazodone at 50 mg at bedtime as needed for sleep  Patient educated about serotonin syndrome with interactions between Prozac and tramadol. Patient stated that she takes tramadol once in a while and she realizes that she could have interactions and will watch out for it.  Patient will hollow up in 3 month.   Bernie Fobes 09/01/2015, 1:40 PM

## 2015-09-13 ENCOUNTER — Other Ambulatory Visit: Payer: Self-pay

## 2015-09-13 DIAGNOSIS — M9979 Connective tissue and disc stenosis of intervertebral foramina of abdomen and other regions: Secondary | ICD-10-CM

## 2015-09-13 MED ORDER — GABAPENTIN 300 MG PO CAPS: ORAL_CAPSULE | ORAL | Status: DC

## 2015-09-14 ENCOUNTER — Other Ambulatory Visit: Payer: Self-pay

## 2015-09-14 MED ORDER — BENAZEPRIL HCL 5 MG PO TABS: 5.0000 mg | ORAL_TABLET | Freq: Every day | ORAL | Status: DC

## 2015-09-14 MED ORDER — CLOPIDOGREL BISULFATE 75 MG PO TABS: 75.0000 mg | ORAL_TABLET | Freq: Every day | ORAL | Status: DC

## 2015-09-14 MED ORDER — METOPROLOL TARTRATE 50 MG PO TABS: 50.0000 mg | ORAL_TABLET | Freq: Two times a day (BID) | ORAL | Status: DC

## 2015-09-16 ENCOUNTER — Other Ambulatory Visit: Payer: Self-pay

## 2015-09-16 DIAGNOSIS — M9979 Connective tissue and disc stenosis of intervertebral foramina of abdomen and other regions: Secondary | ICD-10-CM

## 2015-09-16 MED ORDER — GABAPENTIN 300 MG PO CAPS: ORAL_CAPSULE | ORAL | Status: DC

## 2015-09-29 ENCOUNTER — Ambulatory Visit (INDEPENDENT_AMBULATORY_CARE_PROVIDER_SITE_OTHER): Payer: Managed Care, Other (non HMO) | Admitting: Family Medicine

## 2015-09-29 ENCOUNTER — Encounter: Payer: Self-pay | Admitting: Family Medicine

## 2015-09-29 ENCOUNTER — Other Ambulatory Visit: Payer: Self-pay | Admitting: Family Medicine

## 2015-09-29 ENCOUNTER — Other Ambulatory Visit: Payer: Self-pay

## 2015-09-29 VITALS — BP 110/72 | HR 70 | Temp 98.3°F | Resp 16 | Ht 66.0 in | Wt 156.0 lb

## 2015-09-29 DIAGNOSIS — Z01818 Encounter for other preprocedural examination: Secondary | ICD-10-CM

## 2015-09-29 NOTE — Telephone Encounter (Signed)
pt called states she needs all of her medication sent to [phone] that her insurance has changed and they need the rx.

## 2015-09-29 NOTE — Progress Notes (Signed)
Patient: Jessica BrookingSarah A Landry Female    DOB: 09/24/1955   60 y.o.   MRN: [id number] Visit Date: 09/29/2015  Today's Provider: Lorie PhenixNancy Jadin Kagel, MD   Chief Complaint  Patient presents with  . Pre-op Exam   Subjective:    HPI   Preoperative Examination  Pt is going to have oral surgery on 10/06/2015. Is having dental implants. Needs to discuss when to D/C Plavix. No recent adverse reaction to anesthesia.  Was off Plavix for knee surgery and did well. Feels well today. Walked up stairs today. No chest pain. No SOB.   Allergies  Allergen Reactions  . Bee Venom Anaphylaxis  . Statins Other (See Comments)    Myalgias & fatigue  . Etodolac   . Prednisone Other (See Comments)    Can take small amounts. HR increase; increased energy   . Sulfa Antibiotics Other (See Comments)    Elevated B/P, skin turns red   Current Meds  Medication Sig  . benazepril (LOTENSIN) 5 MG tablet Take 1 tablet (5 mg total) by mouth daily before breakfast.  . buPROPion (WELLBUTRIN XL) 300 MG 24 hr tablet Take 1 tablet (300 mg total) by mouth every morning.  . clopidogrel (PLAVIX) 75 MG tablet Take 1 tablet (75 mg total) by mouth daily.  Marland Kitchen. FLUoxetine (PROZAC) 20 MG capsule Take 1 capsule (20 mg total) by mouth daily before breakfast.  . gabapentin (NEURONTIN) 300 MG capsule TAKE 2 CAPSULES EVERY NIGHT  . metoprolol (LOPRESSOR) 50 MG tablet Take 1 tablet (50 mg total) by mouth 2 (two) times daily.  Marland Kitchen. omeprazole (PRILOSEC OTC) 20 MG tablet Take 20 mg by mouth daily before breakfast.   . [DISCONTINUED] traZODone (DESYREL) 50 MG tablet Take 1 tablet (50 mg total) by mouth at bedtime.    Review of Systems  Constitutional: Positive for activity change (is more active s/p bilateral knee replacements). Negative for fever, chills, diaphoresis, appetite change, fatigue and unexpected weight change.  Respiratory: Negative for cough, shortness of breath and wheezing.   Cardiovascular: Negative for chest pain,  palpitations and leg swelling.    Social History  Substance Use Topics  . Smoking status: Former Smoker -- 0.25 packs/day for 40 years    Types: Cigarettes    Quit date: 03/07/2011  . Smokeless tobacco: Never Used  . Alcohol Use: No   Objective:   BP 110/72 mmHg  Pulse 70  Temp(Src) 98.3 F (36.8 C) (Oral)  Resp 16  Ht 5\' 6"  (1.676 m)  Wt 156 lb (70.761 kg)  BMI 25.19 kg/m2  SpO2 99%  Physical Exam  Constitutional: She is oriented to person, place, and time. She appears well-developed and well-nourished.  Cardiovascular: Normal rate, regular rhythm and normal heart sounds.   Pulmonary/Chest: Effort normal and breath sounds normal. No respiratory distress.  Neurological: She is alert and oriented to person, place, and time.  Psychiatric: She has a normal mood and affect. Her behavior is normal.  Vitals reviewed.     Assessment & Plan:     1. Preoperative examination Cleared for surgery. D/C Plavix 7 days prior to surgery (which is today). Restart one day after surgery. EKG unchanged. Check labs. - EKG 12-Lead - CBC with Differential/Platelet  2. Hypocalcemia Incidental finding today. Will check labs. FU pending results. - Comprehensive metabolic panel - TSH     Patient seen and examined by Leo GrosserNancy J. Fabiha Rougeau, MD, and note scribed by Allene DillonEmily Drozdowski, CMA.   I have reviewed the  document for accuracy and completeness and I agree with above. - Leo GrosserNancy J. Uzma Hellmer, MD   Lorie PhenixNancy Voyd Groft, MD  [facility]

## 2015-09-30 LAB — COMPREHENSIVE METABOLIC PANEL
A/G RATIO: 1.5 (ref 1.2–2.2)
ALT: 16 IU/L (ref 0–32)
AST: 18 IU/L (ref 0–40)
Albumin: 4.4 g/dL (ref 3.6–4.8)
Alkaline Phosphatase: 93 IU/L (ref 39–117)
BUN/Creatinine Ratio: 11 — ABNORMAL LOW (ref 12–28)
BUN: 10 mg/dL (ref 8–27)
Bilirubin Total: 0.3 mg/dL (ref 0.0–1.2)
CALCIUM: 9.6 mg/dL (ref 8.7–10.3)
CO2: 25 mmol/L (ref 18–29)
CREATININE: 0.94 mg/dL (ref 0.57–1.00)
Chloride: 96 mmol/L (ref 96–106)
GFR, EST AFRICAN AMERICAN: 76 mL/min/{1.73_m2} (ref >59)
GFR, EST NON AFRICAN AMERICAN: 66 mL/min/{1.73_m2} (ref >59)
GLOBULIN, TOTAL: 2.9 g/dL (ref 1.5–4.5)
Glucose: 83 mg/dL (ref 65–99)
POTASSIUM: 3.7 mmol/L (ref 3.5–5.2)
SODIUM: 142 mmol/L (ref 134–144)
TOTAL PROTEIN: 7.3 g/dL (ref 6.0–8.5)

## 2015-09-30 LAB — CBC WITH DIFFERENTIAL/PLATELET
BASOS: 1 %
Basophils Absolute: 0.1 10*3/uL (ref 0.0–0.2)
EOS (ABSOLUTE): 0.1 10*3/uL (ref 0.0–0.4)
EOS: 2 %
HEMATOCRIT: 37.3 % (ref 34.0–46.6)
HEMOGLOBIN: 12.4 g/dL (ref 11.1–15.9)
IMMATURE GRANS (ABS): 0 10*3/uL (ref 0.0–0.1)
IMMATURE GRANULOCYTES: 0 %
LYMPHS: 28 %
Lymphocytes Absolute: 1.7 10*3/uL (ref 0.7–3.1)
MCH: 29.2 pg (ref 26.6–33.0)
MCHC: 33.2 g/dL (ref 31.5–35.7)
MCV: 88 fL (ref 79–97)
MONOCYTES: 6 %
Monocytes Absolute: 0.4 10*3/uL (ref 0.1–0.9)
NEUTROS ABS: 3.9 10*3/uL (ref 1.4–7.0)
NEUTROS PCT: 63 %
Platelets: 344 10*3/uL (ref 150–379)
RBC: 4.24 x10E6/uL (ref 3.77–5.28)
RDW: 14.1 % (ref 12.3–15.4)
WBC: 6.1 10*3/uL (ref 3.4–10.8)

## 2015-09-30 LAB — SPECIMEN STATUS REPORT

## 2015-09-30 LAB — TSH: TSH: 2.83 u[IU]/mL (ref 0.450–4.500)

## 2015-10-03 ENCOUNTER — Telehealth: Payer: Self-pay

## 2015-10-03 NOTE — Telephone Encounter (Signed)
Pt returning call, pt would like a call back [phone].

## 2015-10-03 NOTE — Telephone Encounter (Signed)
-----   Message from Lorie PhenixNancy Maloney, MD sent at 10/01/2015  8:42 AM EDT ----- Labs stable. Please notify patient. Thanks.

## 2015-10-03 NOTE — Telephone Encounter (Signed)
LMTCB 10/03/2015  Thanks,   -Odilia Damico  

## 2015-10-04 NOTE — Telephone Encounter (Signed)
please send

## 2015-10-04 NOTE — Telephone Encounter (Signed)
Pt advised.   Thanks,   -Kainoa Swoboda  

## 2015-10-04 NOTE — Telephone Encounter (Signed)
LMTCB 10/04/2015  Thanks,   -Tahtiana Rozier  

## 2015-10-04 NOTE — Telephone Encounter (Signed)
Dr Daleen Boravi pt

## 2015-10-06 MED ORDER — BUPROPION HCL ER (XL) 300 MG PO TB24: 300.0000 mg | ORAL_TABLET | ORAL | Status: DC

## 2015-10-06 MED ORDER — FLUOXETINE HCL 20 MG PO CAPS: 20.0000 mg | ORAL_CAPSULE | Freq: Every day | ORAL | Status: DC

## 2015-12-01 ENCOUNTER — Encounter: Payer: Self-pay | Admitting: Psychiatry

## 2015-12-01 ENCOUNTER — Ambulatory Visit (INDEPENDENT_AMBULATORY_CARE_PROVIDER_SITE_OTHER): Payer: Managed Care, Other (non HMO) | Admitting: Psychiatry

## 2015-12-01 VITALS — BP 121/74 | HR 56 | Temp 98.1°F | Ht 66.0 in | Wt 161.4 lb

## 2015-12-01 DIAGNOSIS — Z634 Disappearance and death of family member: Secondary | ICD-10-CM

## 2015-12-01 DIAGNOSIS — F331 Major depressive disorder, recurrent, moderate: Secondary | ICD-10-CM

## 2015-12-01 MED ORDER — BUPROPION HCL ER (XL) 300 MG PO TB24: 300.0000 mg | ORAL_TABLET | ORAL | 0 refills | Status: DC

## 2015-12-01 MED ORDER — FLUOXETINE HCL 40 MG PO CAPS: 40.0000 mg | ORAL_CAPSULE | Freq: Every day | ORAL | 0 refills | Status: DC

## 2015-12-01 NOTE — Progress Notes (Signed)
Patient ID: Jessica Landry, female   DOB: Aug 31, 1955, 60 y.o.   MRN: [id number]   [facility] MD/PA/NP OP Progress Note  12/01/2015 10:11 AM CHRISHAWNA FARINA  MRN:  [id number]  Subjective:  Patient returns for follow-up of her major depressive disorder, recurrent moderate.  Denies any suicidal thoughts.   Chief Complaint: Doing great Chief Complaint    Follow-up; Medication Refill     Visit Diagnosis:   No diagnosis found.  Past Medical History:  Past Medical History:  Diagnosis Date  . Anxiety   . Arthritis    knees & back   . CAD S/P percutaneous coronary angioplasty 03/07/2011   a) PCI of mRCA with Promus Element DES 3.5 mm x 28 mm (4.2 -- 3.7 mm); b) Myoview 04/2011: EF 81% (small LV Cavity), fixed basal-mid Inferior Infarct, No Ischemia, 10 METS  . Complication of anesthesia   . Depression 03/08/2011   Dr. Leory Plowman- [place], [facility]  . Essential hypertension 03/07/2011  . Family history of adverse reaction to anesthesia   . GERD (gastroesophageal reflux disease)   . Headache    chocolate & red wine triggers /w bad headaches   . History of back surgery   . Hyperlipidemia with target LDL less than 70 2012    Statin Intolerance (tried Lipitor & Crestor)  . Insomnia    without significant sleeping disorders  . Iron deficiency anemia 03/09/2011  . Neuropathy (HCC)   . PONV (postoperative nausea and vomiting)   . ST-segment elevation myocardial infarction (STEMI) of inferior wall (HCC) 03/07/2011   RCA Occlusion --> PCI of mRCA; ECHO 03/2011: EF > 55%, MIld Inferior HK, Mild AoV Sclerosis.  . Vocal cord polyp 2005    Past Surgical History:  Procedure Laterality Date  . ABDOMINAL HYSTERECTOMY    . APPENDECTOMY  07/04/2000   Normal appendix, adhesions noted.  Marland Kitchen BACK SURGERY     cyst on spinal cord - lumbar  . BREAST BIOPSY Right 01/31/1999   fibrocystic changes, ductal adenosis, focal atypical ductal epithelium.  Marland Kitchen CARDIAC CATHETERIZATION  03/08/2011   improved flow from the  PCI  . CHOLECYSTECTOMY  03/25/1998   Dr. Lemar Livings, chronic cholecystitis.  . COLONOSCOPY WITH PROPOFOL N/A 09/28/2014   Procedure: COLONOSCOPY WITH PROPOFOL;  Surgeon: Earline Mayotte, MD;  Location: [facility] ENDOSCOPY;  Service: Endoscopy;  Laterality: N/A;  . CORONARY ANGIOPLASTY WITH STENT PLACEMENT  03/07/2011   inferior STEMI - RCA PROMUS 3.5 mm x 28 mm DES  (post Dil 3.7 distal to 4.2 prox)  . DENTAL SURGERY  2016  . DOPPLER ECHOCARDIOGRAPHY  03/27/2011   LV cavity is small,EF =>55%,MILD INFERIOR HYPOKINESIS; Mild Aortic Sclerosis  . ESOPHAGOGASTRODUODENOSCOPY N/A 09/28/2014   Procedure: ESOPHAGOGASTRODUODENOSCOPY (EGD);  Surgeon: Earline Mayotte, MD;  Location: [facility] ENDOSCOPY;  Service: Endoscopy;  Laterality: N/A;  . EYE SURGERY     blepheroplasty- bilateral  . JOINT REPLACEMENT    . KNEE SURGERY Left 2015  . KNEE SURGERY Right 2010   Baker cyst  . LEFT HEART CATHETERIZATION WITH CORONARY ANGIOGRAM N/A 03/07/2011   Procedure: LEFT HEART CATHETERIZATION WITH CORONARY ANGIOGRAM;  Surgeon: Marykay Lex, MD;  Location: [facility] CATH LAB;  Service: Cardiovascular;  Laterality: N/A;  . LEFT HEART CATHETERIZATION WITH CORONARY ANGIOGRAM N/A 03/08/2011   Procedure: LEFT HEART CATHETERIZATION WITH CORONARY ANGIOGRAM;  Surgeon: Marykay Lex, MD;  Location: [facility] CATH LAB;  Service: Cardiovascular;  Laterality: N/A;  . NM MYOCAR PERF WALL MOTION  04/20/2011   EF 81% (due  to small LVcavity),fixed basal to mid inferior INFARCT - NO ISCHEMIA; 10 METs  . skin nodule Left 09/11/1999   dermatofibroma left lower leg, positive lateral margin.  Marland Kitchen. TOTAL KNEE ARTHROPLASTY Bilateral 07/20/2015   Procedure: TOTAL KNEE BILATERAL;  Surgeon: Loreta Aveaniel F Murphy, MD;  [facility];  Service: Orthopedics;  Laterality: Bilateral;  . UPPER GI ENDOSCOPY  02-22-03   Dr. Lemar LivingsByrnett, normal   Family History:  Family History  Problem Relation Age of Onset  . Heart attack Mother   . Coronary artery disease Mother   .  Stroke Mother   . Depression Mother   . Anxiety disorder Mother   . Alzheimer's disease Father   . Stroke Father   . Breast cancer Sister [id number]  . Depression Brother   . Drug abuse Brother   . Bipolar disorder Sister    Social History:  Social History   Social History  . Marital status: Married    Spouse name: N/A  . Number of children: N/A  . Years of education: N/A   Social History Main Topics  . Smoking status: Former Smoker    Packs/day: 0.25    Years: 40.00    Types: Cigarettes    Quit date: 03/07/2011  . Smokeless tobacco: Never Used  . Alcohol use No  . Drug use: No  . Sexual activity: Yes   Other Topics Concern  . None   Social History Narrative   Divorced woman -- Now Remarried to her long-term partner.   Exercises routinely on a daily basis roughly 45 minutes a day walking.  Quit smoking at the time of her MI. Does not drink.   Additional History:   Assessment:   Musculoskeletal: Strength & Muscle Tone: within normal limits Gait & Station: normal Patient leans: N/A  Psychiatric Specialty Exam: Depression         Associated symptoms include does not have insomnia and no suicidal ideas. Medication Refill     Review of Systems  Psychiatric/Behavioral: Positive for depression. Negative for hallucinations, memory loss, substance abuse and suicidal ideas. The patient is not nervous/anxious and does not have insomnia.   All other systems reviewed and are negative.   Blood pressure 121/74, pulse (!) 56, temperature 98.1 F (36.7 C), temperature source Oral, height 5\' 6"  (1.676 m), weight 161 lb 6.4 oz (73.2 kg).Body mass index is 26.05 kg/m.  General Appearance: Neat and Well Groomed  Eye Contact:  Good  Speech:  Normal Rate  Volume:  Normal  Mood:  Much improved   Affect:  Smiling   Thought Process:  Linear  Orientation:  Full (Time, Place, and Person)  Thought Content:  Negative  Suicidal Thoughts:  No  Homicidal Thoughts:  No  Memory:   Immediate;   Good Recent;   Good Remote;   Good  Judgement:  Good  Insight:  Good  Psychomotor Activity:  Negative  Concentration:  Good  Recall:  Good  Fund of Knowledge: Negative  Language: Good  Akathisia:  Negative  Handed:  Right unknown   AIMS (if indicated):  NA  Assets:  Communication Skills Desire for Improvement  ADL's:  Intact  Cognition: WNL  Sleep:  good   Is the patient at risk to self?  No. Has the patient been a risk to self in the past 6 months?  No. Has the patient been a risk to self within the distant past?  No. Is the patient a risk to others?  No. Has the patient  been a risk to others in the past 6 months?  No. Has the patient been a risk to others within the distant past?  No.  Current Medications: Current Outpatient Prescriptions  Medication Sig Dispense Refill  . benazepril (LOTENSIN) 5 MG tablet Take 1 tablet (5 mg total) by mouth daily before breakfast. 90 tablet 2  . buPROPion (WELLBUTRIN XL) 300 MG 24 hr tablet Take 1 tablet (300 mg total) by mouth every morning. 90 tablet 0  . clopidogrel (PLAVIX) 75 MG tablet Take 1 tablet (75 mg total) by mouth daily. 90 tablet 1  . FLUoxetine (PROZAC) 20 MG capsule Take 1 capsule (20 mg total) by mouth daily before breakfast. 90 capsule 0  . gabapentin (NEURONTIN) 300 MG capsule TAKE 2 CAPSULES EVERY NIGHT 180 capsule 2  . metoprolol (LOPRESSOR) 50 MG tablet Take 1 tablet (50 mg total) by mouth 2 (two) times daily. 180 tablet 2  . nitroGLYCERIN (NITROSTAT) 0.4 MG SL tablet Place 1 tablet (0.4 mg total) under the tongue every 5 (five) minutes as needed for chest pain (up to 3 tabs in 15 mins and then call 911). 25 tablet 1  . omeprazole (PRILOSEC OTC) 20 MG tablet Take 20 mg by mouth daily before breakfast.      No current facility-administered medications for this visit.     Medical Decision Making:  Established Problem, Stable/Improving (1) and Review of Medication Regimen & Side Effects (2)  Treatment Plan  Summary:Medication management and Plan   Major depressive disorder, recurrent, moderate- We will continue Wellbutrin XL 300 mg daily. Continue Prozac at 20mg  in the morning.  Insomnia Start trazodone at 50 mg at bedtime as needed for sleep  Patient educated about serotonin syndrome with interactions between Prozac and tramadol. Patient stated that she takes tramadol once in a while and she realizes that she could have interactions and will watch out for it.  Patient will hollow up in 3 month.   Aayliah Rotenberry 12/01/2015, 10:11 AM

## 2015-12-29 ENCOUNTER — Ambulatory Visit (INDEPENDENT_AMBULATORY_CARE_PROVIDER_SITE_OTHER): Payer: Managed Care, Other (non HMO) | Admitting: Psychiatry

## 2015-12-29 VITALS — BP 108/62 | HR 58 | Ht 67.0 in | Wt 160.0 lb

## 2015-12-29 DIAGNOSIS — F331 Major depressive disorder, recurrent, moderate: Secondary | ICD-10-CM | POA: Diagnosis not present

## 2015-12-29 MED ORDER — TRAZODONE HCL 50 MG PO TABS: 50.0000 mg | ORAL_TABLET | Freq: Every day | ORAL | 1 refills | Status: DC

## 2015-12-29 NOTE — Progress Notes (Signed)
Patient ID: Jessica Landry, female   DOB: 18-Feb-1956, 60 y.o.   MRN: [id number]   [facility] MD/PA/NP OP Progress Note  12/29/2015 11:19 AM Jessica Landry  MRN:  [id number]  Subjective:  Patient returns for follow-up of her major depressive disorder, recurrent moderate. Reports tolerating the increase in Prozac well. Feeling more like her self, states she has enthusiasm for life. She is excited about upcoming trip to [place] for her husband`s son`s wedding. Fair sleep and appetite. Denies any suicidal thoughts.  Chief Complaint: Doing well  Visit Diagnosis:     ICD-9-CM ICD-10-CM   1. Major depressive disorder, recurrent episode, moderate (HCC) 296.32 F33.1     Past Medical History:  Past Medical History:  Diagnosis Date  . Anxiety   . Arthritis    knees & back   . CAD S/P percutaneous coronary angioplasty 03/07/2011   a) PCI of mRCA with Promus Element DES 3.5 mm x 28 mm (4.2 -- 3.7 mm); b) Myoview 04/2011: EF 81% (small LV Cavity), fixed basal-mid Inferior Infarct, No Ischemia, 10 METS  . Complication of anesthesia   . Depression 03/08/2011   Dr. Leory Plowman- [place], [facility]  . Essential hypertension 03/07/2011  . Family history of adverse reaction to anesthesia   . GERD (gastroesophageal reflux disease)   . Headache    chocolate & red wine triggers /w bad headaches   . History of back surgery   . Hyperlipidemia with target LDL less than 70 2012    Statin Intolerance (tried Lipitor & Crestor)  . Insomnia    without significant sleeping disorders  . Iron deficiency anemia 03/09/2011  . Neuropathy (HCC)   . PONV (postoperative nausea and vomiting)   . ST-segment elevation myocardial infarction (STEMI) of inferior wall (HCC) 03/07/2011   RCA Occlusion --> PCI of mRCA; ECHO 03/2011: EF > 55%, MIld Inferior HK, Mild AoV Sclerosis.  . Vocal cord polyp 2005    Past Surgical History:  Procedure Laterality Date  . ABDOMINAL HYSTERECTOMY    . APPENDECTOMY  07/04/2000   Normal appendix,  adhesions noted.  Marland Kitchen BACK SURGERY     cyst on spinal cord - lumbar  . BREAST BIOPSY Right 01/31/1999   fibrocystic changes, ductal adenosis, focal atypical ductal epithelium.  Marland Kitchen CARDIAC CATHETERIZATION  03/08/2011   improved flow from the PCI  . CHOLECYSTECTOMY  03/25/1998   Dr. Lemar Livings, chronic cholecystitis.  . COLONOSCOPY WITH PROPOFOL N/A 09/28/2014   Procedure: COLONOSCOPY WITH PROPOFOL;  Surgeon: Earline Mayotte, MD;  Location: [facility] ENDOSCOPY;  Service: Endoscopy;  Laterality: N/A;  . CORONARY ANGIOPLASTY WITH STENT PLACEMENT  03/07/2011   inferior STEMI - RCA PROMUS 3.5 mm x 28 mm DES  (post Dil 3.7 distal to 4.2 prox)  . DENTAL SURGERY  2016  . DOPPLER ECHOCARDIOGRAPHY  03/27/2011   LV cavity is small,EF =>55%,MILD INFERIOR HYPOKINESIS; Mild Aortic Sclerosis  . ESOPHAGOGASTRODUODENOSCOPY N/A 09/28/2014   Procedure: ESOPHAGOGASTRODUODENOSCOPY (EGD);  Surgeon: Earline Mayotte, MD;  Location: [facility] ENDOSCOPY;  Service: Endoscopy;  Laterality: N/A;  . EYE SURGERY     blepheroplasty- bilateral  . JOINT REPLACEMENT    . KNEE SURGERY Left 2015  . KNEE SURGERY Right 2010   Baker cyst  . LEFT HEART CATHETERIZATION WITH CORONARY ANGIOGRAM N/A 03/07/2011   Procedure: LEFT HEART CATHETERIZATION WITH CORONARY ANGIOGRAM;  Surgeon: Marykay Lex, MD;  Location[facility] CATH LAB;  Service: Cardiovascular;  Laterality: N/A;  . LEFT HEART CATHETERIZATION WITH CORONARY ANGIOGRAM N/A 03/08/2011  Procedure: LEFT HEART CATHETERIZATION WITH CORONARY ANGIOGRAM;  Surgeon: Marykay Lex, MD;  Location: [facility] CATH LAB;  Service: Cardiovascular;  Laterality: N/A;  . NM MYOCAR PERF WALL MOTION  04/20/2011   EF 81% (due to small LVcavity),fixed basal to mid inferior INFARCT - NO ISCHEMIA; 10 METs  . skin nodule Left 09/11/1999   dermatofibroma left lower leg, positive lateral margin.  Marland Kitchen TOTAL KNEE ARTHROPLASTY Bilateral 07/20/2015   Procedure: TOTAL KNEE BILATERAL;  Surgeon: Loreta Ave, MD;  Location: [facility]  OR;  Service: Orthopedics;  Laterality: Bilateral;  . UPPER GI ENDOSCOPY  02-22-03   Dr. Lemar Livings, normal   Family History:  Family History  Problem Relation Age of Onset  . Heart attack Mother   . Coronary artery disease Mother   . Stroke Mother   . Depression Mother   . Anxiety disorder Mother   . Alzheimer's disease Father   . Stroke Father   . Breast cancer Sister 19  . Depression Brother   . Drug abuse Brother   . Bipolar disorder Sister    Social History:  Social History   Social History  . Marital status: Married    Spouse name: N/A  . Number of children: N/A  . Years of education: N/A   Social History Main Topics  . Smoking status: Former Smoker    Packs/day: 0.25    Years: 40.00    Types: Cigarettes    Quit date: 03/07/2011  . Smokeless tobacco: Never Used  . Alcohol use No  . Drug use: No  . Sexual activity: Yes   Other Topics Concern  . Not on file   Social History Narrative   Divorced woman -- Now Remarried to her long-term partner.   Exercises routinely on a daily basis roughly 45 minutes a day walking.  Quit smoking at the time of her MI. Does not drink.   Additional History:   Assessment:   Musculoskeletal: Strength & Muscle Tone: within normal limits Gait & Station: normal Patient leans: N/A  Psychiatric Specialty Exam: Medication Refill   Depression         Associated symptoms include does not have insomnia and no suicidal ideas.   Review of Systems  Psychiatric/Behavioral: Positive for depression. Negative for hallucinations, memory loss, substance abuse and suicidal ideas. The patient is not nervous/anxious and does not have insomnia.   All other systems reviewed and are negative.   There were no vitals taken for this visit.There is no height or weight on file to calculate BMI.  General Appearance: Neat and Well Groomed  Eye Contact:  Good  Speech:  Normal Rate  Volume:  Normal  Mood:  Much improved   Affect:  Smiling   Thought  Process:  Linear  Orientation:  Full (Time, Place, and Person)  Thought Content:  Negative  Suicidal Thoughts:  No  Homicidal Thoughts:  No  Memory:  Immediate;   Good Recent;   Good Remote;   Good  Judgement:  Good  Insight:  Good  Psychomotor Activity:  Negative  Concentration:  Good  Recall:  Good  Fund of Knowledge: Negative  Language: Good  Akathisia:  Negative  Handed:  Right unknown   AIMS (if indicated):  NA  Assets:  Communication Skills Desire for Improvement  ADL's:  Intact  Cognition: WNL  Sleep:  good   Is the patient at risk to self?  No. Has the patient been a risk to self in the past 6 months?  No. Has the patient been a risk to self within the distant past?  No. Is the patient a risk to others?  No. Has the patient been a risk to others in the past 6 months?  No. Has the patient been a risk to others within the distant past?  No.  Current Medications: Current Outpatient Prescriptions  Medication Sig Dispense Refill  . benazepril (LOTENSIN) 5 MG tablet Take 1 tablet (5 mg total) by mouth daily before breakfast. 90 tablet 2  . buPROPion (WELLBUTRIN XL) 300 MG 24 hr tablet Take 1 tablet (300 mg total) by mouth every morning. 90 tablet 0  . clopidogrel (PLAVIX) 75 MG tablet Take 1 tablet (75 mg total) by mouth daily. 90 tablet 1  . FLUoxetine (PROZAC) 40 MG capsule Take 1 capsule (40 mg total) by mouth daily before breakfast. 90 capsule 0  . gabapentin (NEURONTIN) 300 MG capsule TAKE 2 CAPSULES EVERY NIGHT 180 capsule 2  . metoprolol (LOPRESSOR) 50 MG tablet Take 1 tablet (50 mg total) by mouth 2 (two) times daily. 180 tablet 2  . nitroGLYCERIN (NITROSTAT) 0.4 MG SL tablet Place 1 tablet (0.4 mg total) under the tongue every 5 (five) minutes as needed for chest pain (up to 3 tabs in 15 mins and then call 911). 25 tablet 1  . omeprazole (PRILOSEC OTC) 20 MG tablet Take 20 mg by mouth daily before breakfast.      No current facility-administered medications for  this visit.     Medical Decision Making:  Established Problem, Stable/Improving (1) and Review of Medication Regimen & Side Effects (2)  Treatment Plan Summary:Medication management and Plan   Major depressive disorder, recurrent, moderate- We will continue Wellbutrin XL 300 mg daily. Continue Prozac at 40mg  in the morning.  Insomnia Start trazodone at 50 mg at bedtime as needed for sleep  Patient will hollow up in 3 month.   Jessica Landry 12/29/2015, 11:19 AM

## 2016-01-17 ENCOUNTER — Telehealth: Payer: Self-pay

## 2016-01-17 NOTE — Telephone Encounter (Signed)
left message that it was ok for a 90 day supply with no additional refills

## 2016-01-17 NOTE — Telephone Encounter (Signed)
Ok to call in 90 day supply? 

## 2016-01-17 NOTE — Telephone Encounter (Signed)
received a fax request for refill for a 90 day supply on the bupropion 300mg .  pt has appt in nov. pt will not have enough to get to appt

## 2016-02-02 ENCOUNTER — Other Ambulatory Visit: Payer: Self-pay | Admitting: Cardiology

## 2016-02-02 ENCOUNTER — Other Ambulatory Visit: Payer: Self-pay | Admitting: *Deleted

## 2016-02-02 DIAGNOSIS — E785 Hyperlipidemia, unspecified: Secondary | ICD-10-CM

## 2016-02-03 LAB — LIPID PANEL W/O CHOL/HDL RATIO
CHOLESTEROL TOTAL: 297 mg/dL — AB (ref 100–199)
HDL: 84 mg/dL (ref >39)
LDL CALC: 199 mg/dL — AB (ref 0–99)
TRIGLYCERIDES: 72 mg/dL (ref 0–149)
VLDL Cholesterol Cal: 14 mg/dL (ref 5–40)

## 2016-02-03 LAB — HEPATIC FUNCTION PANEL
ALT: 14 IU/L (ref 0–32)
AST: 13 IU/L (ref 0–40)
Albumin: 4.1 g/dL (ref 3.6–4.8)
Alkaline Phosphatase: 99 IU/L (ref 39–117)
BILIRUBIN TOTAL: 0.4 mg/dL (ref 0.0–1.2)
BILIRUBIN, DIRECT: 0.09 mg/dL (ref 0.00–0.40)
Total Protein: 6.7 g/dL (ref 6.0–8.5)

## 2016-02-08 ENCOUNTER — Encounter: Payer: Self-pay | Admitting: Cardiology

## 2016-02-08 ENCOUNTER — Ambulatory Visit (INDEPENDENT_AMBULATORY_CARE_PROVIDER_SITE_OTHER): Payer: Self-pay | Admitting: Cardiology

## 2016-02-08 VITALS — BP 112/70 | HR 54 | Ht 67.0 in | Wt 159.0 lb

## 2016-02-08 DIAGNOSIS — I2510 Atherosclerotic heart disease of native coronary artery without angina pectoris: Secondary | ICD-10-CM

## 2016-02-08 DIAGNOSIS — Z955 Presence of coronary angioplasty implant and graft: Secondary | ICD-10-CM

## 2016-02-08 DIAGNOSIS — E785 Hyperlipidemia, unspecified: Secondary | ICD-10-CM

## 2016-02-08 DIAGNOSIS — I10 Essential (primary) hypertension: Secondary | ICD-10-CM

## 2016-02-08 DIAGNOSIS — Z9861 Coronary angioplasty status: Secondary | ICD-10-CM

## 2016-02-08 DIAGNOSIS — E663 Overweight: Secondary | ICD-10-CM

## 2016-02-08 DIAGNOSIS — I2119 ST elevation (STEMI) myocardial infarction involving other coronary artery of inferior wall: Secondary | ICD-10-CM

## 2016-02-08 MED ORDER — ATORVASTATIN CALCIUM 10 MG PO TABS: 10.0000 mg | ORAL_TABLET | Freq: Every day | ORAL | 11 refills | Status: DC

## 2016-02-08 NOTE — Progress Notes (Signed)
PCP: Lorie PhenixNancy Maloney, MD  Clinic Note: Chief Complaint  Patient presents with  . Follow-up    yearly exam.  . Coronary Artery Disease    Inferior MI with PCI to the RCA    HPI: Jessica Landry is a 60 y.o. female with a PMH below who presents today for annual follow-up of CAD PCI following inferior STEMI.  Jessica BrookingSarah A Landry was last seen in November 2016  Recent Hospitalizations: Bilateral Knee Replacement July 20, 2015  Studies Reviewed: No new studies  Interval History: Jessica SagoSarah presents today in great spirits. She is smiling, stating that she feels great. She has recovered wonderfully from her knee surgeries and is now able to walk without pain. She now walks at least 6 miles a week and also does exercises at the gym. She has been monitoring her diet - She had gained some weight due to being immobilized prior to her knee surgery, is now actively trying to lose it back. With all the exercises she is doing, she denies any chest pain or shortness of breath with rest or exertion.  No PND, orthopnea or edema.  No palpitations, lightheadedness, dizziness, weakness or syncope/near syncope. No TIA/amaurosis fugax symptoms. No melena, hematochezia, hematuria, or epstaxis. No claudication Since she is no longer having the pain in her legs related to her arthritis pains, she is actually willing to consider trying a statin. She had gone to see Phillips HayKristin Alvstad, RPH-CCP in our lipid clinic, but was unable to do the trial run of statins until her knee surgeries because of confusing potential myalgias with arthritis related pain.  ROS: A comprehensive was performed. Review of Systems  Constitutional: Negative for malaise/fatigue and weight loss.  HENT: Negative for nosebleeds.   Eyes: Negative.   Respiratory: Negative for cough, shortness of breath and wheezing.   Gastrointestinal: Negative for blood in stool and constipation.  Genitourinary: Negative for hematuria.  Musculoskeletal: Negative  for joint pain (No longer having the knee pain or the muscle pain around the knees.).  Skin: Negative.   Neurological: Negative for dizziness, loss of consciousness and headaches.  Endo/Heme/Allergies: Negative for environmental allergies.  Psychiatric/Behavioral: Negative for memory loss. The patient is not nervous/anxious and does not have insomnia.     Past Medical History:  Diagnosis Date  . Anxiety   . Arthritis    knees & back   . CAD S/P percutaneous coronary angioplasty 03/07/2011   a) PCI of mRCA with Promus Element DES 3.5 mm x 28 mm (4.2 -- 3.7 mm); b) Myoview 04/2011: EF 81% (small LV Cavity), fixed basal-mid Inferior Infarct, No Ischemia, 10 METS  . Complication of anesthesia   . Depression 03/08/2011   [place]. [place]  . Essential hypertension 03/07/2011  . Family history of adverse reaction to anesthesia   . GERD (gastroesophageal reflux disease)   . Headache    chocolate & red wine triggers /w bad headaches   . History of back surgery   . Hyperlipidemia with target LDL less than 70 2012    Statin Intolerance (tried Lipitor & Crestor)  . Insomnia    without significant sleeping disorders  . Iron deficiency anemia 03/09/2011  . Neuropathy (HCC)   . PONV (postoperative nausea and vomiting)   . ST-segment elevation myocardial infarction (STEMI) of inferior wall (HCC) 03/07/2011   RCA Occlusion --> PCI of mRCA; ECHO 03/2011: EF > 55%, MIld Inferior HK, Mild AoV Sclerosis.  . Vocal cord polyp 2005    Past Surgical  History:  Procedure Laterality Date  . ABDOMINAL HYSTERECTOMY    . APPENDECTOMY  07/04/2000   Normal appendix, adhesions noted.  Marland Kitchen BACK SURGERY     cyst on spinal cord - lumbar  . BREAST BIOPSY Right 01/31/1999   fibrocystic changes, ductal adenosis, focal atypical ductal epithelium.  Marland Kitchen CARDIAC CATHETERIZATION  03/08/2011   improved flow from the PCI  . CHOLECYSTECTOMY  03/25/1998   Dr. Lemar Livings, chronic cholecystitis.  . COLONOSCOPY WITH  PROPOFOL N/A 09/28/2014   Procedure: COLONOSCOPY WITH PROPOFOL;  Surgeon: Earline Mayotte, MD;  Location: [facility] ENDOSCOPY;  Service: Endoscopy;  Laterality: N/A;  . CORONARY ANGIOPLASTY WITH STENT PLACEMENT  03/07/2011   inferior STEMI - RCA PROMUS 3.5 mm x 28 mm DES  (post Dil 3.7 distal to 4.2 prox)  . DENTAL SURGERY  2016  . DOPPLER ECHOCARDIOGRAPHY  03/27/2011   LV cavity is small,EF =>55%,MILD INFERIOR HYPOKINESIS; Mild Aortic Sclerosis  . ESOPHAGOGASTRODUODENOSCOPY N/A 09/28/2014   Procedure: ESOPHAGOGASTRODUODENOSCOPY (EGD);  Surgeon: Earline Mayotte, MD;  Location: [facility] ENDOSCOPY;  Service: Endoscopy;  Laterality: N/A;  . EYE SURGERY     blepheroplasty- bilateral  . JOINT REPLACEMENT    . KNEE SURGERY Left 2015  . KNEE SURGERY Right 2010   Baker cyst  . LEFT HEART CATHETERIZATION WITH CORONARY ANGIOGRAM N/A 03/07/2011   Procedure: LEFT HEART CATHETERIZATION WITH CORONARY ANGIOGRAM;  Surgeon: Marykay Lex, MD;  Location: [facility] CATH LAB;  Service: Cardiovascular;  Laterality: N/A;  . LEFT HEART CATHETERIZATION WITH CORONARY ANGIOGRAM N/A 03/08/2011   Procedure: LEFT HEART CATHETERIZATION WITH CORONARY ANGIOGRAM;  Surgeon: Marykay Lex, MD;  Location: [facility] CATH LAB;  Service: Cardiovascular;  Laterality: N/A;  . NM MYOCAR PERF WALL MOTION  04/20/2011   EF 81% (due to small LVcavity),fixed basal to mid inferior INFARCT - NO ISCHEMIA; 10 METs  . skin nodule Left 09/11/1999   dermatofibroma left lower leg, positive lateral margin.  Marland Kitchen TOTAL KNEE ARTHROPLASTY Bilateral 07/20/2015   Procedure: TOTAL KNEE BILATERAL;  Surgeon: Loreta Ave, MD;  Location: [facility] OR;  Service: Orthopedics;  Laterality: Bilateral;  . UPPER GI ENDOSCOPY  02-22-03   Dr. Lemar Livings, normal    Prior to Admission medications   Medication Sig Start Date End Date Taking? Authorizing Provider  benazepril (LOTENSIN) 5 MG tablet Take 1 tablet (5 mg total) by mouth daily before breakfast. 09/14/15  Yes Marykay Lex, MD    buPROPion (WELLBUTRIN XL) 300 MG 24 hr tablet Take 1 tablet (300 mg total) by mouth every morning. 12/01/15 11/23/16 Yes Himabindu Ravi, MD  clopidogrel (PLAVIX) 75 MG tablet Take 1 tablet (75 mg total) by mouth daily. 09/14/15  Yes Marykay Lex, MD  FLUoxetine (PROZAC) 40 MG capsule Take 1 capsule (40 mg total) by mouth daily before breakfast. 12/01/15 11/23/16 Yes Himabindu Ravi, MD  gabapentin (NEURONTIN) 300 MG capsule TAKE 2 CAPSULES EVERY NIGHT 09/16/15  Yes Lorie Phenix, MD  metoprolol (LOPRESSOR) 50 MG tablet Take 1 tablet (50 mg total) by mouth 2 (two) times daily. 09/14/15  Yes Marykay Lex, MD  nitroGLYCERIN (NITROSTAT) 0.4 MG SL tablet Place 1 tablet (0.4 mg total) under the tongue every 5 (five) minutes as needed for chest pain (up to 3 tabs in 15 mins and then call 911). 06/16/13  Yes Marykay Lex, MD  omeprazole (PRILOSEC OTC) 20 MG tablet Take 20 mg by mouth daily before breakfast.    Yes Historical Provider, MD  traZODone (DESYREL) 50 MG tablet  Take 1 tablet (50 mg total) by mouth at bedtime. 12/29/15  Yes Patrick North, MD    Allergies  Allergen Reactions  . Bee Venom Anaphylaxis  . Statins Other (See Comments)    Myalgias & fatigue  . Etodolac   . Prednisone Other (See Comments)    Can take small amounts. HR increase; increased energy   . Sulfa Antibiotics Other (See Comments)    Elevated B/P, skin turns red    Social History   Social History  . Marital status: Married    Spouse name: N/A  . Number of children: N/A  . Years of education: N/A   Social History Main Topics  . Smoking status: Former Smoker    Packs/day: 0.25    Years: 40.00    Types: Cigarettes    Quit date: 03/07/2011  . Smokeless tobacco: Never Used  . Alcohol use No  . Drug use: No  . Sexual activity: Yes   Other Topics Concern  . None   Social History Narrative   Divorced woman -- Now Remarried to her long-term partner.   Exercises routinely on a daily basis roughly 45 minutes a day  walking.  Quit smoking at the time of her MI. Does not drink.   Family History  Problem Relation Age of Onset  . Heart attack Mother   . Coronary artery disease Mother   . Stroke Mother   . Depression Mother   . Anxiety disorder Mother   . Alzheimer's disease Father   . Stroke Father   . Breast cancer Sister 55  . Depression Brother   . Drug abuse Brother   . Bipolar disorder Sister      Wt Readings from Last 3 Encounters:  02/09/16 72.4 kg (159 lb 9.6 oz)  02/08/16 72.1 kg (159 lb)  12/29/15 72.6 kg (160 lb)    PHYSICAL EXAM BP 112/70   Pulse (!) 54   Ht 5\' 7"  (1.702 m)   Wt 72.1 kg (159 lb)   BMI 24.90 kg/m  General appearance: alert, cooperative, appears stated age, no distress and Pleasant mood and affect. Well-nourished and well-groomed. HEENT: [place]/AT, EOMI, MMM, anicteric sclera Neck: no adenopathy, no JVD, supple, symmetrical, trachea midline, thyroid not enlarged, symmetric, no tenderness/mass/nodules and Soft right-sided bruit versus referred aortic sclerosis murmur. Lungs: clear to auscultation bilaterally, normal percussion bilaterally and Nonlabored, good air movement Heart: normal apical impulse, regular rate and rhythm, S1&S2 normal, no S3 or S4; 1/6 early-peaking harsh c-d SEM @ RUSB--> carotids, no click and no rub Abdomen: soft, non-tender; bowel sounds normal; no masses, no organomegaly and No masses or firm fecalith palpated Extremities: extremities normal, atraumatic, no cyanosis or edema, no edema, redness or tenderness in the calves or thighs and no ulcers, gangrene or trophic changes Pulses: 2+ and symmetric Skin: Skin color, texture, turgor normal. No rashes or lesions Neurologic: Grossly normal    Adult ECG Report  Rate: 54 ;  Rhythm: normal sinus rhythm and Normal axis, intervals and durations.;   Narrative Interpretation: Otherwise normal EKG   Other studies Reviewed: Additional studies/ records that were reviewed today include:  Recent  Labs:  Not on Rx! Lab Results  Component Value Date   CHOL 297 (H) 02/02/2016   HDL 84 02/02/2016   LDLCALC 199 (H) 02/02/2016   TRIG 72 02/02/2016   CHOLHDL 3.2 06/28/2014    ASSESSMENT / PLAN: Problem List Items Addressed This Visit    ST elevation myocardial infarction (STEMI) of inferior wall,  subsequent episode of care [facility](HCC) - Primary (Chronic)    No signs of recurrent angina or heart failure symptoms. Well-preserved EF on both echo and nuclear. There is evidence of a fixed basal to mid inferior infarct but no evidence of ischemia. She was able to do 10 METS on the TM portion. Now back to routine exercise. On beta blocker, ACE inhibitor and Plavix alone. - Ready to try a run of statin       Relevant Medications   atorvastatin (LIPITOR) 10 MG tablet   Other Relevant Orders   EKG 12-Lead (Completed)   Hepatic function panel   Lipid panel   Presence of stent in right coronary artery (Chronic)   Relevant Orders   EKG 12-Lead (Completed)   Hepatic function panel   Lipid panel   Overweight (BMI 25.0-29.9) (Chronic)   Relevant Orders   Hepatic function panel   Lipid panel   Hyperlipidemia with target LDL less than 70; Statin Intolerant (Chronic)    Very poorly controlled lipids with diet and exercise. Now that she has had her knee surgeries, and is not having nearly as much leg pain, she is willing to try statins as there were no be fusion between statin related myalgias, and her arthritis pain.  Plan start Lipitor 10 mg every other day for 2 weeks, then increased to daily. Will monitor for symptoms and recheck lipids in 3 months - and follow-up in lipid clinic. Adjust based on labs and symptoms.  We'll have her follow-up as necessary in [facility] with out pharmacist team for medication titration & if necessary, consideration of [facility]-9 Inhiibitor Rx (likely Praluent)      Relevant Medications   atorvastatin (LIPITOR) 10 MG tablet   Other Relevant Orders   Hepatic  function panel   Lipid panel   CAD S/P PCI mRCA: Promus Element DES 3.5 mm x 28 mm (4.2-37 mm)) (Chronic)    DES in the RCA. On Plavix alone without aspirin. On beta blocker and ACE inhibitor. Quit smoking. No use of sublingual nitroglycerin Starting statin      Relevant Medications   atorvastatin (LIPITOR) 10 MG tablet   Other Relevant Orders   EKG 12-Lead (Completed)   Hepatic function panel   Lipid panel   Benign essential HTN (Chronic)    Excellent blood pressure control on low-dose ACE inhibitor and moderate dose beta blocker.      Relevant Medications   atorvastatin (LIPITOR) 10 MG tablet   Other Relevant Orders   EKG 12-Lead (Completed)   Hepatic function panel   Lipid panel    Other Visit Diagnoses   None.     Current medicines are reviewed at length with the patient today. (+/- concerns) none The following changes have been made: see below - will try Statin Rx  Patient Instructions  Medication Instructions:  START ATORVASTATIN 10 MG EVERY OTHER DAY FOR 2 WEEKS  THEN TAKE ATORVASTATIN 10 MG DAILY IF NO SYMPTOMS OCCUR  Labwork: IN 3 MONTHS AT [facility] WILL MAIL YOU LAB SLIP   Follow-Up: Your physician recommends that you schedule a follow-up appointment in: 3 MONTHS WITH [facility]IC AFTER LABS ARE COMPLETED  Your physician recommends that you schedule a follow-up appointment in: [facility]     If you need a refill on your cardiac medications before your next appointment, please call your pharmacy.     Studies Ordered:   Orders Placed This Encounter  Procedures  . Hepatic function panel  .  Lipid panel  . EKG 12-Lead      Bryan Lemma, M.D., M.S. Interventional Cardiologist   Pager # [phone] Phone # [phone] [address] [place], [place] [address]

## 2016-02-08 NOTE — Patient Instructions (Signed)
Medication Instructions:  START ATORVASTATIN 10 MG EVERY OTHER DAY FOR 2 WEEKS  THEN TAKE ATORVASTATIN 10 MG DAILY IF NO SYMPTOMS OCCUR  Labwork: IN 3 MONTHS AT [facility] WILL MAIL YOU LAB SLIP   Follow-Up: Your physician recommends that you schedule a follow-up appointment in: 3 MONTHS WITH [facility] AFTER LABS ARE COMPLETED  Your physician recommends that you schedule a follow-up appointment in: 12 MON[facility]S [facility]     If you need a refill on your cardiac medications before your next appointment, please call your pharmacy.

## 2016-02-09 ENCOUNTER — Ambulatory Visit (INDEPENDENT_AMBULATORY_CARE_PROVIDER_SITE_OTHER): Payer: Managed Care, Other (non HMO) | Admitting: Psychiatry

## 2016-02-09 ENCOUNTER — Encounter: Payer: Self-pay | Admitting: Psychiatry

## 2016-02-09 VITALS — BP 105/68 | HR 55 | Temp 98.1°F | Wt 159.6 lb

## 2016-02-09 DIAGNOSIS — F331 Major depressive disorder, recurrent, moderate: Secondary | ICD-10-CM

## 2016-02-09 MED ORDER — FLUOXETINE HCL 40 MG PO CAPS: 40.0000 mg | ORAL_CAPSULE | Freq: Every day | ORAL | 0 refills | Status: DC

## 2016-02-09 MED ORDER — TRAZODONE HCL 50 MG PO TABS: 50.0000 mg | ORAL_TABLET | Freq: Every day | ORAL | 1 refills | Status: DC

## 2016-02-09 MED ORDER — BUPROPION HCL ER (XL) 300 MG PO TB24: 300.0000 mg | ORAL_TABLET | ORAL | 0 refills | Status: DC

## 2016-02-09 NOTE — Progress Notes (Signed)
Patient ID: Jessica Landry, female   DOB: 05/16/1955, 60 y.o.   MRN: [id number]   [facility]/NP OP Progress Note  02/09/2016 11:12 AM Jessica Landry  MRN:  [id number]  Subjective:  Patient returns for follow-up of her major depressive disorder, recurrent moderate. Patient reports that she's been doing quite well mostly. She did enjoy her stepson's wedding. Overall enjoying life. She is reports that her older sister came to visit her for a week from [place]  and data very good time. She is also looking to visit her sister and has found a pet place for her dogs to be harassed. She reports occasional episodes of depression that last about 2-3 hours once in a while. Denies any suicidal thoughts.  Chief Complaint: Doing well Chief Complaint    Follow-up; Medication Refill     Visit Diagnosis:     ICD-9-CM ICD-10-CM   1. Major depressive disorder, recurrent episode, moderate (HCC) 296.32 F33.1     Past Medical History:  Past Medical History:  Diagnosis Date  . Anxiety   . Arthritis    knees & back   . CAD S/P percutaneous coronary angioplasty 03/07/2011   a) PCI of mRCA with Promus Element DES 3.5 mm x 28 mm (4.2 -- 3.7 mm); b) Myoview 04/2011: EF 81% (small LV Cavity), fixed basal-mid Inferior Infarct, No Ischemia, 10 METS  . Complication of anesthesia   . Depression 03/08/2011   Dr. Leory [place]ton- [place], [facility]  . Essential hypertension 03/07/2011  . Family history of adverse reaction to anesthesia   . GERD (gastroesophageal reflux disease)   . Headache    chocolate & red wine triggers /w bad headaches   . History of back surgery   . Hyperlipidemia with target LDL less than 70 2012    Statin Intolerance (tried Lipitor & Crestor)  . Insomnia    without significant sleeping disorders  . Iron deficiency anemia 03/09/2011  . Neuropathy (HCC)   . PONV (postoperative nausea and vomiting)   . ST-segment elevation myocardial infarction (STEMI) of inferior wall (HCC) 03/07/2011   RCA  Occlusion --> PCI of mRCA; ECHO 03/2011: EF > 55%, MIld Inferior HK, Mild AoV Sclerosis.  . Vocal cord polyp 2005    Past Surgical History:  Procedure Laterality Date  . ABDOMINAL HYSTERECTOMY    . APPENDECTOMY  07/04/2000   Normal appendix, adhesions noted.  Marland Kitchen. BACK SURGERY     cyst on spinal cord - lumbar  . BREAST BIOPSY Right 01/31/1999   fibrocystic changes, ductal adenosis, focal atypical ductal epithelium.  Marland Kitchen. CARDIAC CATHETERIZATION  03/08/2011   improved flow from the PCI  . CHOLECYSTECTOMY  03/25/1998   Dr. Lemar LivingsByrnett, chronic cholecystitis.  . COLONOSCOPY WITH PROPOFOL N/A 09/28/2014   Procedure: COLONOSCOPY WITH PROPOFOL;  Surgeon: Earline MayotteJeffrey W Byrnett, MD;  [facility];  Service: Endoscopy;  Laterality: N/A;  . CORONARY ANGIOPLASTY WITH STENT PLACEMENT  03/07/2011   inferior STEMI - RCA PROMUS 3.5 mm x 28 mm DES  (post Dil 3.7 distal to 4.2 prox)  . DENTAL SURGERY  2016  . DOPPLER ECHOCARDIOGRAPHY  03/27/2011   LV cavity is small,EF =>55%,MILD INFERIOR HYPOKINESIS; Mild Aortic Sclerosis  . ESOPHAGOGASTRODUODENOSCOPY N/A 09/28/2014   Procedure: ESOPHAGOGASTRODUODENOSCOPY (EGD);  Surgeon: Earline MayotteJeffrey W Byrnett, MD;  [facility];  Service: Endoscopy;  Laterality: N/A;  . EYE SURGERY     blepheroplasty- bilateral  . JOINT REPLACEMENT    . KNEE SURGERY Left 2015  . KNEE SURGERY Right 2010  Baker cyst  . LEFT HEART CATHETERIZATION WITH CORONARY ANGIOGRAM N/A 03/07/2011   Procedure: LEFT HEART CATHETERIZATION WITH CORONARY ANGIOGRAM;  Surgeon: Marykay Lex, MD;  Location: [facility] CATH LAB;  Service: Cardiovascular;  Laterality: N/A;  . LEFT HEART CATHETERIZATION WITH CORONARY ANGIOGRAM N/A 03/08/2011   Procedure: LEFT HEART CATHETERIZATION WITH CORONARY ANGIOGRAM;  Surgeon: Marykay Lex, MD;  Location: [facility] CATH LAB;  Service: Cardiovascular;  Laterality: N/A;  . NM MYOCAR PERF WALL MOTION  04/20/2011   EF 81% (due to small LVcavity),fixed basal to mid inferior  INFARCT - NO ISCHEMIA; 10 METs  . skin nodule Left 09/11/1999   dermatofibroma left lower leg, positive lateral margin.  Marland Kitchen TOTAL KNEE ARTHROPLASTY Bilateral 07/20/2015   Procedure: TOTAL KNEE BILATERAL;  Surgeon: Loreta Ave, MD;  Location: [facility] OR;  Service: Orthopedics;  Laterality: Bilateral;  . UPPER GI ENDOSCOPY  02-22-03   Dr. Lemar Livings, normal   Family History:  Family History  Problem Relation Age of Onset  . Heart attack Mother   . Coronary artery disease Mother   . Stroke Mother   . Depression Mother   . Anxiety disorder Mother   . Alzheimer's disease Father   . Stroke Father   . Breast cancer Sister 42  . Depression Brother   . Drug abuse Brother   . Bipolar disorder Sister    Social History:  Social History   Social History  . Marital status: Married    Spouse name: N/A  . Number of children: N/A  . Years of education: N/A   Social History Main Topics  . Smoking status: Former Smoker    Packs/day: 0.25    Years: 40.00    Types: Cigarettes    Quit date: 03/07/2011  . Smokeless tobacco: Never Used  . Alcohol use No  . Drug use: No  . Sexual activity: Yes   Other Topics Concern  . None   Social History Narrative   Divorced woman -- Now Remarried to her long-term partner.   Exercises routinely on a daily basis roughly 45 minutes a day walking.  Quit smoking at the time of her MI. Does not drink.   Additional History:   Assessment:   Musculoskeletal: Strength & Muscle Tone: within normal limits Gait & Station: normal Patient leans: N/A  Psychiatric Specialty Exam: Medication Refill   Depression         Associated symptoms include does not have insomnia and no suicidal ideas.   Review of Systems  Psychiatric/Behavioral: Negative for depression, hallucinations, memory loss, substance abuse and suicidal ideas. The patient is not nervous/anxious and does not have insomnia.   All other systems reviewed and are negative.   Blood pressure 105/68,  pulse (!) 55, temperature 98.1 F (36.7 C), temperature source Oral, weight 159 lb 9.6 oz (72.4 kg).Body mass index is 25 kg/m.  General Appearance: Neat and Well Groomed  Eye Contact:  Good  Speech:  Normal Rate  Volume:  Normal  Mood:  Much improved   Affect:  Smiling   Thought Process:  Linear  Orientation:  Full (Time, Place, and Person)  Thought Content:  Negative  Suicidal Thoughts:  No  Homicidal Thoughts:  No  Memory:  Immediate;   Good Recent;   Good Remote;   Good  Judgement:  Good  Insight:  Good  Psychomotor Activity:  Negative  Concentration:  Good  Recall:  Good  Fund of Knowledge: Negative  Language: Good  Akathisia:  Negative  Handed:  Right unknown   AIMS (if indicated):  NA  Assets:  Communication Skills Desire for Improvement  ADL's:  Intact  Cognition: WNL  Sleep:  good   Is the patient at risk to self?  No. Has the patient been a risk to self in the past 6 months?  No. Has the patient been a risk to self within the distant past?  No. Is the patient a risk to others?  No. Has the patient been a risk to others in the past 6 months?  No. Has the patient been a risk to others within the distant past?  No.  Current Medications: Current Outpatient Prescriptions  Medication Sig Dispense Refill  . atorvastatin (LIPITOR) 10 MG tablet Take 1 tablet (10 mg total) by mouth daily. 30 tablet 11  . benazepril (LOTENSIN) 5 MG tablet Take 1 tablet (5 mg total) by mouth daily before breakfast. 90 tablet 2  . buPROPion (WELLBUTRIN XL) 300 MG 24 hr tablet Take 1 tablet (300 mg total) by mouth every morning. 90 tablet 0  . clopidogrel (PLAVIX) 75 MG tablet Take 1 tablet (75 mg total) by mouth daily. 90 tablet 1  . FLUoxetine (PROZAC) 40 MG capsule Take 1 capsule (40 mg total) by mouth daily before breakfast. 90 capsule 0  . gabapentin (NEURONTIN) 300 MG capsule TAKE 2 CAPSULES EVERY NIGHT 180 capsule 2  . metoprolol (LOPRESSOR) 50 MG tablet Take 1 tablet (50 mg total)  by mouth 2 (two) times daily. 180 tablet 2  . nitroGLYCERIN (NITROSTAT) 0.4 MG SL tablet Place 1 tablet (0.4 mg total) under the tongue every 5 (five) minutes as needed for chest pain (up to 3 tabs in 15 mins and then call 911). 25 tablet 1  . omeprazole (PRILOSEC OTC) 20 MG tablet Take 20 mg by mouth daily before breakfast.     . traZODone (DESYREL) 50 MG tablet Take 1 tablet (50 mg total) by mouth at bedtime. 30 tablet 1   No current facility-administered medications for this visit.     Medical Decision Making:  Established Problem, Stable/Improving (1) and Review of Medication Regimen & Side Effects (2)  Treatment Plan Summary:Medication management and Plan   Major depressive disorder, recurrent, moderate- We will continue Wellbutrin XL 300 mg daily. Continue Prozac at 40mg  in the morning. Mindfulness techniques discussed with patient's to deal with her grief  Insomnia Continue trazodone at 50 mg at bedtime as needed for sleep  Patient will hollow up in 3 month.   Kataleah Bejar 02/09/2016, 11:12 AM

## 2016-02-10 ENCOUNTER — Encounter: Payer: Self-pay | Admitting: Cardiology

## 2016-02-10 NOTE — Assessment & Plan Note (Signed)
No signs of recurrent angina or heart failure symptoms. Well-preserved EF on both echo and nuclear. There is evidence of a fixed basal to mid inferior infarct but no evidence of ischemia. She was able to do 10 METS on the TM portion. Now back to routine exercise. On beta blocker, ACE inhibitor and Plavix alone. - Ready to try a run of statin

## 2016-02-10 NOTE — Assessment & Plan Note (Signed)
Very poorly controlled lipids with diet and exercise. Now that she has had her knee surgeries, and is not having nearly as much leg pain, she is willing to try statins as there were no be fusion between statin related myalgias, and her arthritis pain.  Plan start Lipitor 10 mg every other day for 2 weeks, then increased to daily. Will monitor for symptoms and recheck lipids in 3 months - and follow-up in lipid clinic. Adjust based on labs and symptoms.  We'll have her follow-up as necessary in [facility] with out pharmacist team for medication titration & if necessary, consideration of PCSK-9 Inhiibitor Rx (likely Praluent)

## 2016-02-10 NOTE — Assessment & Plan Note (Signed)
Excellent blood pressure control on low-dose ACE inhibitor and moderate dose beta blocker.

## 2016-02-10 NOTE — Assessment & Plan Note (Addendum)
DES in the RCA. On Plavix alone without aspirin. On beta blocker and ACE inhibitor. Quit smoking. No use of sublingual nitroglycerin Starting statin

## 2016-04-05 ENCOUNTER — Telehealth: Payer: Self-pay | Admitting: *Deleted

## 2016-04-05 DIAGNOSIS — I2119 ST elevation (STEMI) myocardial infarction involving other coronary artery of inferior wall: Secondary | ICD-10-CM

## 2016-04-05 DIAGNOSIS — E785 Hyperlipidemia, unspecified: Secondary | ICD-10-CM

## 2016-04-05 DIAGNOSIS — I2510 Atherosclerotic heart disease of native coronary artery without angina pectoris: Secondary | ICD-10-CM

## 2016-04-05 DIAGNOSIS — Z955 Presence of coronary angioplasty implant and graft: Secondary | ICD-10-CM

## 2016-04-05 DIAGNOSIS — I10 Essential (primary) hypertension: Secondary | ICD-10-CM

## 2016-04-05 DIAGNOSIS — E663 Overweight: Secondary | ICD-10-CM

## 2016-04-05 DIAGNOSIS — Z9861 Coronary angioplasty status: Secondary | ICD-10-CM

## 2016-04-05 NOTE — Telephone Encounter (Signed)
Mail  Letter and lab slip to [facility]

## 2016-04-05 NOTE — Telephone Encounter (Signed)
-----   Message from Tobin ChadSharon V Tacey Dimaggio, RN sent at 02/08/2016  1:47 PM EDT ----- Need labs  Hepatic ,lipid in May 10 2016 [place]

## 2016-04-16 ENCOUNTER — Other Ambulatory Visit: Payer: Self-pay | Admitting: *Deleted

## 2016-04-16 MED ORDER — CLOPIDOGREL BISULFATE 75 MG PO TABS: 75.0000 mg | ORAL_TABLET | Freq: Every day | ORAL | 1 refills | Status: DC

## 2016-05-07 ENCOUNTER — Ambulatory Visit: Payer: Managed Care, Other (non HMO) | Admitting: Psychiatry

## 2016-05-15 ENCOUNTER — Other Ambulatory Visit: Payer: Self-pay | Admitting: Cardiology

## 2016-05-16 LAB — LIPID PANEL
CHOL/HDL RATIO: 3.4 ratio (ref 0.0–4.4)
CHOLESTEROL TOTAL: 281 mg/dL — AB (ref 100–199)
Chol/HDL Ratio: 3.4 ratio units (ref 0.0–4.4)
Cholesterol, Total: 281 mg/dL — ABNORMAL HIGH (ref 100–199)
HDL: 83 mg/dL (ref >39)
HDL: 83 mg/dL (ref >39)
LDL CALC: 179 mg/dL — AB (ref 0–99)
LDL Calculated: 179 mg/dL — ABNORMAL HIGH (ref 0–99)
TRIGLYCERIDES: 94 mg/dL (ref 0–149)
Triglycerides: 94 mg/dL (ref 0–149)
VLDL CHOLESTEROL CAL: 19 mg/dL (ref 5–40)
VLDL Cholesterol Cal: 19 mg/dL (ref 5–40)

## 2016-05-16 LAB — HEPATIC FUNCTION PANEL
ALBUMIN: 4.1 g/dL (ref 3.6–4.8)
ALT: 16 IU/L (ref 0–32)
AST: 14 IU/L (ref 0–40)
Alkaline Phosphatase: 84 IU/L (ref 39–117)
BILIRUBIN TOTAL: 0.2 mg/dL (ref 0.0–1.2)
Bilirubin, Direct: 0.05 mg/dL (ref 0.00–0.40)
Total Protein: 6.3 g/dL (ref 6.0–8.5)

## 2016-05-22 ENCOUNTER — Ambulatory Visit: Payer: Self-pay

## 2016-05-22 ENCOUNTER — Ambulatory Visit (INDEPENDENT_AMBULATORY_CARE_PROVIDER_SITE_OTHER): Payer: BLUE CROSS/BLUE SHIELD | Admitting: Pharmacist

## 2016-05-22 DIAGNOSIS — E785 Hyperlipidemia, unspecified: Secondary | ICD-10-CM | POA: Diagnosis not present

## 2016-05-22 NOTE — Progress Notes (Signed)
Patient ID: Jessica Landry                 DOB: 1956/04/03                    MRN: [id number]     HPI: Jessica Landry is a 61 y.o. female patient referred to lipid clinic by [place]. PMH includes CAD with PCI following inferior STEMI, HTN and hyperlipidemia.  Patient intolerant to statins due to severe myalgia and also intolerant to zetia 10mg .  Most recent statin trial included atorvastatin 10mg  po 3x /weekly but patient developed knee pain shortly after initiating therapy.  Current Medications: none  Intolerances: atorvastatin 10mg , ezetimibe 10mg , rosuvastatin 5mg  - severe myalgia and problems ambualting  Risk Factors: STEMI, HTN and hyperlipidemia  LDL goal: <70  Diet: low fat, no fried foods, baked food and lost of vegetables.   Exercise: walking 2 miles 3x per week but stopped recently due t pain of her knees; trying to get back ointo routine. Walking 1-2 times per week now.  Family History: Father - high cholesterol , stroke; Sisters with high cholesterol (~200 LDL); mother MI in her 51s and stroke  Social History: former smoker (quit 5 years ago); denies alcohol   Labs: CHO 281; HDL 83; TG 94; LDL 179 (05/15/2016) CHO 297; HDL 84; TG 72; LDL 199 (02/02/16)  Past Medical History:  Diagnosis Date  . Anxiety   . Arthritis    knees & back   . CAD S/P percutaneous coronary angioplasty 03/07/2011   a) PCI of mRCA with Promus Element DES 3.5 mm x 28 mm (4.2 -- 3.7 mm); b) Myoview 04/2011: EF 81% (small LV Cavity), fixed basal-mid Inferior Infarct, No Ischemia, 10 METS  . Complication of anesthesia   . Depression 03/08/2011   Dr. Leory Plowman- [place], [facility]  . Essential hypertension 03/07/2011  . Family history of adverse reaction to anesthesia   . GERD (gastroesophageal reflux disease)   . Headache    chocolate & red wine triggers /w bad headaches   . History of back surgery   . Hyperlipidemia with target LDL less than 70 2012    Statin Intolerance (tried Lipitor & Crestor)  .  Insomnia    without significant sleeping disorders  . Iron deficiency anemia 03/09/2011  . Neuropathy (HCC)   . PONV (postoperative nausea and vomiting)   . ST-segment elevation myocardial infarction (STEMI) of inferior wall (HCC) 03/07/2011   RCA Occlusion --> PCI of mRCA; ECHO 03/2011: EF > 55%, MIld Inferior HK, Mild AoV Sclerosis.  . Vocal cord polyp 2005    Current Outpatient Prescriptions on File Prior to Visit  Medication Sig Dispense Refill  . atorvastatin (LIPITOR) 10 MG tablet Take 1 tablet (10 mg total) by mouth daily. 30 tablet 11  . benazepril (LOTENSIN) 5 MG tablet Take 1 tablet (5 mg total) by mouth daily before breakfast. 90 tablet 2  . buPROPion (WELLBUTRIN XL) 300 MG 24 hr tablet Take 1 tablet (300 mg total) by mouth every morning. 90 tablet 0  . clopidogrel (PLAVIX) 75 MG tablet Take 1 tablet (75 mg total) by mouth daily. 30 tablet 1  . FLUoxetine (PROZAC) 40 MG capsule Take 1 capsule (40 mg total) by mouth daily before breakfast. 90 capsule 0  . gabapentin (NEURONTIN) 300 MG capsule TAKE 2 CAPSULES EVERY NIGHT 180 capsule 2  . metoprolol (LOPRESSOR) 50 MG tablet Take 1 tablet (50 mg total) by mouth 2 (two) times daily. 180  tablet 2  . nitroGLYCERIN (NITROSTAT) 0.4 MG SL tablet Place 1 tablet (0.4 mg total) under the tongue every 5 (five) minutes as needed for chest pain (up to 3 tabs in 15 mins and then call 911). 25 tablet 1  . omeprazole (PRILOSEC OTC) 20 MG tablet Take 20 mg by mouth daily before breakfast.     . traZODone (DESYREL) 50 MG tablet Take 1 tablet (50 mg total) by mouth at bedtime. 30 tablet 1   No current facility-administered medications on file prior to visit.     Allergies  Allergen Reactions  . Bee Venom Anaphylaxis  . Statins Other (See Comments)    Myalgias & fatigue  . Etodolac   . Prednisone Other (See Comments)    Can take small amounts. HR increase; increased energy   . Sulfa Antibiotics Other (See Comments)    Elevated B/P, skin turns  red    Hyperlipidemia: LDL remains markedly elevated above goal of <70 for a patient with CAD and hx of STEMI.  Patient unable to tolerate statins including low dose statins.  Documented failure to atorvastatin 10mg  daily, atorvastatin 10mg  3x/week, Crestor 5mg  daily, and and zetia 10mg  noted.  Options of PCSK9 inhibitors and clinical trials discussed with patient during appointment.  Appropriate diet and lifestyle modifications also discussed.  Will initiate paperwork for medical insurance pre-authorization for Repatha. Storage, administration, and potential side effects of Repatha/Praluent discussed with patient as well.  Will keep patient updated during process and follow up in clinic if/when needed.   Briseis Aguilera Rodriguez-Guzman PharmD, BCPS [facility] [address] [place] [phone] 05/22/2016 1:56 PM

## 2016-05-22 NOTE — Patient Instructions (Addendum)
  Phone to Pharmacist clinic : [phone] Jessica Landry/Jessica Landry   Cholesterol Cholesterol is a fat. Your body needs a small amount of cholesterol. Cholesterol (plaque) may build up in your blood vessels (arteries). That makes you more likely to have a heart attack or stroke. You cannot feel your cholesterol level. Having a blood test is the only way to find out if your level is high. Keep your test results. Work with your doctor to keep your cholesterol at a good level. What do the results mean?  Total cholesterol is how much cholesterol is in your blood.  LDL is bad cholesterol. This is the type that can build up. Try to have low LDL.  HDL is good cholesterol. It cleans your blood vessels and carries LDL away. Try to have high HDL.  Triglycerides are fat that the body can store or burn for energy. What are good levels of cholesterol?  Total cholesterol below 200.  LDL below 100 is good for people who have health risks. LDL below 70 is good for people who have very high risks.  HDL above 40 is good. It is best to have HDL of 60 or higher.  Triglycerides below 150. How can I lower my cholesterol? Diet  Follow your diet program as told by your doctor.  Choose fish, white meat chicken, or [place] that is roasted or baked. Try not to eat red meat, fried foods, sausage, or lunch meats.  Eat lots of fresh fruits and vegetables.  Choose whole grains, beans, pasta, potatoes, and cereals.  Choose olive oil, corn oil, or canola oil. Only use small amounts.  Try not to eat butter, mayonnaise, shortening, or palm kernel oils.  Try not to eat foods with trans fats.  Choose low-fat or nonfat dairy foods.  Drink skim or nonfat milk.  Eat low-fat or nonfat yogurt and cheeses.  Try not to drink whole milk or cream.  Try not to eat ice cream, egg yolks, or full-fat cheeses.  Healthy desserts include angel food cake, ginger snaps, animal crackers, hard candy, popsicles, and low-fat or  nonfat frozen yogurt. Try not to eat pastries, cakes, pies, and cookies. Exercise  Follow your exercise program as told by your doctor.  Be more active. Try gardening, walking, and taking the stairs.  Ask your doctor about ways that you can be more active. Medicine  Take over-the-counter and prescription medicines only as told by your doctor. This information is not intended to replace advice given to you by your health care provider. Make sure you discuss any questions you have with your health care provider. Document Released: 06/22/2008 Document Revised: 10/26/2015 Document Reviewed: 10/06/2015 Elsevier Interactive Patient Education  2017 [employer]

## 2016-05-23 ENCOUNTER — Encounter: Payer: Self-pay | Admitting: Psychiatry

## 2016-05-23 ENCOUNTER — Ambulatory Visit (INDEPENDENT_AMBULATORY_CARE_PROVIDER_SITE_OTHER): Payer: BLUE CROSS/BLUE SHIELD | Admitting: Psychiatry

## 2016-05-23 VITALS — BP 120/75 | HR 59 | Temp 97.9°F | Wt 164.0 lb

## 2016-05-23 DIAGNOSIS — F331 Major depressive disorder, recurrent, moderate: Secondary | ICD-10-CM | POA: Diagnosis not present

## 2016-05-23 MED ORDER — FLUOXETINE HCL 40 MG PO CAPS: 40.0000 mg | ORAL_CAPSULE | Freq: Every day | ORAL | 0 refills | Status: DC

## 2016-05-23 MED ORDER — BUPROPION HCL ER (XL) 300 MG PO TB24: 300.0000 mg | ORAL_TABLET | ORAL | 0 refills | Status: DC

## 2016-05-23 NOTE — Progress Notes (Signed)
Patient ID: Jessica Landry, female   DOB: 1955-06-01, 61 y.o.   MRN: [id number]   [facility] MD/PA/NP OP Progress Note  05/23/2016 10:18 AM Jessica Landry  MRN:  [id number]  Subjective:  Patient returns for follow-up of her major depressive disorder, recurrent moderate. Patient reports that she has been doing okay overall. States that she has had a good winter. Her depression is under control. She is able to sleep well most nights. When she cannot sleep well she does take the trazodone. She reports that her current therapist is retiring and she misses her. Patient continues to obsess somewhat about her mother's death and about the things that she could have done with her. We discussed in detail that she would benefit from therapy to get over the obsessive guilt. She agrees with this.. Denies any suicidal thoughts.  Chief Complaint: Doing well Chief Complaint    Follow-up; Medication Refill     Visit Diagnosis:     ICD-9-CM ICD-10-CM   1. Major depressive disorder, recurrent episode, moderate (HCC) 296.32 F33.1     Past Medical History:  Past Medical History:  Diagnosis Date  . Anxiety   . Arthritis    knees & back   . CAD S/P percutaneous coronary angioplasty 03/07/2011   a) PCI of mRCA with Promus Element DES 3.5 mm x 28 mm (4.2 -- 3.7 mm); b) Myoview 04/2011: EF 81% (small LV Cavity), fixed basal-mid Inferior Infarct, No Ischemia, 10 METS  . Complication of anesthesia   . Depression 03/08/2011   Dr. Leory Plowman- [place], [facility]  . Essential hypertension 03/07/2011  . Family history of adverse reaction to anesthesia   . GERD (gastroesophageal reflux disease)   . Headache    chocolate & red wine triggers /w bad headaches   . History of back surgery   . Hyperlipidemia with target LDL less than 70 2012    Statin Intolerance (tried Lipitor & Crestor)  . Insomnia    without significant sleeping disorders  . Iron deficiency anemia 03/09/2011  . Neuropathy (HCC)   . PONV (postoperative nausea  and vomiting)   . ST-segment elevation myocardial infarction (STEMI) of inferior wall (HCC) 03/07/2011   RCA Occlusion --> PCI of mRCA; ECHO 03/2011: EF > 55%, MIld Inferior HK, Mild AoV Sclerosis.  . Vocal cord polyp 2005    Past Surgical History:  Procedure Laterality Date  . ABDOMINAL HYSTERECTOMY    . APPENDECTOMY  07/04/2000   Normal appendix, adhesions noted.  Marland Kitchen BACK SURGERY     cyst on spinal cord - lumbar  . BREAST BIOPSY Right 01/31/1999   fibrocystic changes, ductal adenosis, focal atypical ductal epithelium.  Marland Kitchen CARDIAC CATHETERIZATION  03/08/2011   improved flow from the PCI  . CHOLECYSTECTOMY  03/25/1998   Dr. Lemar Livings, chronic cholecystitis.  . COLONOSCOPY WITH PROPOFOL N/A 09/28/2014   Procedure: COLONOSCOPY WITH PROPOFOL;  Surgeon: Earline Mayotte, MD;  Location: [facility] ENDOSCOPY;  Service: Endoscopy;  Laterality: N/A;  . CORONARY ANGIOPLASTY WITH STENT PLACEMENT  03/07/2011   inferior STEMI - RCA PROMUS 3.5 mm x 28 mm DES  (post Dil 3.7 distal to 4.2 prox)  . DENTAL SURGERY  2016  . DOPPLER ECHOCARDIOGRAPHY  03/27/2011   LV cavity is small,EF =>55%,MILD INFERIOR HYPOKINESIS; Mild Aortic Sclerosis  . ESOPHAGOGASTRODUODENOSCOPY N/A 09/28/2014   Procedure: ESOPHAGOGASTRODUODENOSCOPY (EGD);  Surgeon: Earline Mayotte, MD;  Location: [facility] ENDOSCOPY;  Service: Endoscopy;  Laterality: N/A;  . EYE SURGERY     blepheroplasty- bilateral  .  JOINT REPLACEMENT    . KNEE SURGERY Left 2015  . KNEE SURGERY Right 2010   Baker cyst  . LEFT HEART CATHETERIZATION WITH CORONARY ANGIOGRAM N/A 03/07/2011   Procedure: LEFT HEART CATHETERIZATION WITH CORONARY ANGIOGRAM;  Surgeon: Marykay Lex, MD;  Location: [facility] CATH LAB;  Service: Cardiovascular;  Laterality: N/A;  . LEFT HEART CATHETERIZATION WITH CORONARY ANGIOGRAM N/A 03/08/2011   Procedure: LEFT HEART CATHETERIZATION WITH CORONARY ANGIOGRAM;  Surgeon: Marykay Lex, MD;  Location: [facility] CATH LAB;  Service: Cardiovascular;  Laterality:  N/A;  . NM MYOCAR PERF WALL MOTION  04/20/2011   EF 81% (due to small LVcavity),fixed basal to mid inferior INFARCT - NO ISCHEMIA; 10 METs  . skin nodule Left 09/11/1999   dermatofibroma left lower leg, positive lateral margin.  Marland Kitchen TOTAL KNEE ARTHROPLASTY Bilateral 07/20/2015   Procedure: TOTAL KNEE BILATERAL;  Surgeon: Loreta Ave, MD;  Location: [facility] OR;  Service: Orthopedics;  Laterality: Bilateral;  . UPPER GI ENDOSCOPY  02-22-03   Dr. Lemar Livings, normal   Family History:  Family History  Problem Relation Age of Onset  . Heart attack Mother   . Coronary artery disease Mother   . Stroke Mother   . Depression Mother   . Anxiety disorder Mother   . Alzheimer's disease Father   . Stroke Father   . Breast cancer Sister 33  . Depression Brother   . Drug abuse Brother   . Bipolar disorder Sister    Social History:  Social History   Social History  . Marital status: Married    Spouse name: N/A  . Number of children: N/A  . Years of education: N/A   Social History Main Topics  . Smoking status: Former Smoker    Packs/day: 0.25    Years: 40.00    Types: Cigarettes    Quit date: 03/07/2011  . Smokeless tobacco: Never Used  . Alcohol use No  . Drug use: No  . Sexual activity: Yes   Other Topics Concern  . None   Social History Narrative   Divorced woman -- Now Remarried to her long-term partner.   Exercises routinely on a daily basis roughly 45 minutes a day walking.  Quit smoking at the time of her MI. Does not drink.   Additional History:   Assessment:   Musculoskeletal: Strength & Muscle Tone: within normal limits Gait & Station: normal Patient leans: N/A  Psychiatric Specialty Exam: Medication Refill   Depression         Associated symptoms include does not have insomnia and no suicidal ideas.   Review of Systems  Psychiatric/Behavioral: Negative for depression, hallucinations, memory loss, substance abuse and suicidal ideas. The patient is not  nervous/anxious and does not have insomnia.   All other systems reviewed and are negative.   Blood pressure 120/75, pulse (!) 59, temperature 97.9 F (36.6 C), temperature source Oral, weight 164 lb (74.4 kg).Body mass index is 25.69 kg/m.  General Appearance: Neat and Well Groomed  Eye Contact:  Good  Speech:  Normal Rate  Volume:  Normal  Mood:  Much improved   Affect:  Smiling   Thought Process:  Linear  Orientation:  Full (Time, Place, and Person)  Thought Content:  Negative  Suicidal Thoughts:  No  Homicidal Thoughts:  No  Memory:  Immediate;   Good Recent;   Good Remote;   Good  Judgement:  Good  Insight:  Good  Psychomotor Activity:  Negative  Concentration:  Good  Recall:  Good  Fund of Knowledge: Negative  Language: Good  Akathisia:  Negative  Handed:  Right unknown   AIMS (if indicated):  NA  Assets:  Communication Skills Desire for Improvement  ADL's:  Intact  Cognition: WNL  Sleep:  good   Is the patient at risk to self?  No. Has the patient been a risk to self in the past 6 months?  No. Has the patient been a risk to self within the distant past?  No. Is the patient a risk to others?  No. Has the patient been a risk to others in the past 6 months?  No. Has the patient been a risk to others within the distant past?  No.  Current Medications: Current Outpatient Prescriptions  Medication Sig Dispense Refill  . atorvastatin (LIPITOR) 10 MG tablet Take 1 tablet (10 mg total) by mouth daily. 30 tablet 11  . benazepril (LOTENSIN) 5 MG tablet Take 1 tablet (5 mg total) by mouth daily before breakfast. 90 tablet 2  . buPROPion (WELLBUTRIN XL) 300 MG 24 hr tablet Take 1 tablet (300 mg total) by mouth every morning. 90 tablet 0  . clopidogrel (PLAVIX) 75 MG tablet Take 1 tablet (75 mg total) by mouth daily. 30 tablet 1  . FLUoxetine (PROZAC) 40 MG capsule Take 1 capsule (40 mg total) by mouth daily before breakfast. 90 capsule 0  . gabapentin (NEURONTIN) 300 MG  capsule TAKE 2 CAPSULES EVERY NIGHT 180 capsule 2  . metoprolol (LOPRESSOR) 50 MG tablet Take 1 tablet (50 mg total) by mouth 2 (two) times daily. 180 tablet 2  . nitroGLYCERIN (NITROSTAT) 0.4 MG SL tablet Place 1 tablet (0.4 mg total) under the tongue every 5 (five) minutes as needed for chest pain (up to 3 tabs in 15 mins and then call 911). 25 tablet 1  . omeprazole (PRILOSEC OTC) 20 MG tablet Take 20 mg by mouth daily before breakfast.     . traZODone (DESYREL) 50 MG tablet Take 1 tablet (50 mg total) by mouth at bedtime. 30 tablet 1   No current facility-administered medications for this visit.     Medical Decision Making:  Established Problem, Stable/Improving (1) and Review of Medication Regimen & Side Effects (2)  Treatment Plan Summary:Medication management and Plan   Major depressive disorder, recurrent, moderate- We will continue Wellbutrin XL 300 mg daily. Continue Prozac at 40mg  in the morning. Mindfulness techniques discussed with patient's to deal with her grief Patient given a list of therapists to start therapy to address the aunt killed and some obsessive symptoms.  Insomnia Continue trazodone at 50 mg at bedtime as needed for sleep  Patient will follow up in 1 month.   Jessica Landry 05/23/2016, 10:18 AM

## 2016-06-06 ENCOUNTER — Telehealth: Payer: Self-pay | Admitting: Pharmacist

## 2016-06-06 MED ORDER — EVOLOCUMAB 140 MG/ML ~~LOC~~ SOAJ: 1.0000 "pen " | SUBCUTANEOUS | 11 refills | Status: DC

## 2016-06-06 NOTE — Telephone Encounter (Signed)
Repatha prior authorization approved for 6 months by insurance.   Rx sent to [facility] and patient notified.  She will call use back as soon as 1st shipment received or if any problems with process.

## 2016-06-20 ENCOUNTER — Encounter: Payer: Self-pay | Admitting: Psychiatry

## 2016-06-20 ENCOUNTER — Ambulatory Visit (INDEPENDENT_AMBULATORY_CARE_PROVIDER_SITE_OTHER): Payer: BLUE CROSS/BLUE SHIELD | Admitting: Psychiatry

## 2016-06-20 VITALS — BP 129/73 | HR 57 | Temp 98.4°F | Wt 163.4 lb

## 2016-06-20 DIAGNOSIS — Z634 Disappearance and death of family member: Secondary | ICD-10-CM | POA: Diagnosis not present

## 2016-06-20 DIAGNOSIS — F331 Major depressive disorder, recurrent, moderate: Secondary | ICD-10-CM

## 2016-06-20 MED ORDER — FLUOXETINE HCL 10 MG PO CAPS: 10.0000 mg | ORAL_CAPSULE | Freq: Every day | ORAL | 2 refills | Status: DC

## 2016-06-20 NOTE — Progress Notes (Signed)
Patient ID: Jessica Landry, female   DOB: 03/12/1956, 61 y.o.   MRN: [id number]   [facility] Progress Note  06/20/2016 11:22 AM Jessica Landry  MRN:  [id number]  Subjective:  Patient returns for follow-up of her major depressive disorder, recurrent moderate. Patient reports that she has been doing okay and tried calling tina thompson, but she is not in her network. Patient reports that she really misses her old therapist. She also states that she has multiple times that she becomes very sad in a week and that is a trigger. States that she then starts crying and it lasts for about 30 minutes. She continues to have a lot of guilt around the death of her mother. She also reports a lot of anxiety around finances though she says that she does have a good retirement fund.  Chief Complaint: Doing well Chief Complaint    Follow-up; Medication Refill     Visit Diagnosis:     ICD-9-CM ICD-10-CM   1. Major depressive disorder, recurrent episode, moderate (HCC) 296.32 F33.1   2. Bereavement V62.82 Z63.4     Past Medical History:  Past Medical History:  Diagnosis Date  . Anxiety   . Arthritis    knees & back   . CAD S/P percutaneous coronary angioplasty 03/07/2011   a) PCI of mRCA with Promus Element DES 3.5 mm x 28 mm (4.2 -- 3.7 mm); b) Myoview 04/2011: EF 81% (small LV Cavity), fixed basal-mid Inferior Infarct, No Ischemia, 10 METS  . Complication of anesthesia   . Depression 03/08/2011   [place]. Leory [place]- [place], [facility]  . Essential hypertension 03/07/2011  . Family history of adverse reaction to anesthesia   . GERD (gastroesophageal reflux disease)   . Headache    chocolate & red wine triggers /w bad headaches   . History of back surgery   . Hyperlipidemia with target LDL less than 70 2012    Statin Intolerance (tried Lipitor & Crestor)  . Insomnia    without significant sleeping disorders  . Iron deficiency anemia 03/09/2011  . Neuropathy (HCC)   . PONV (postoperative nausea and  vomiting)   . ST-segment elevation myocardial infarction (STEMI) of inferior wall (HCC) 03/07/2011   RCA Occlusion --> PCI of mRCA; ECHO 03/2011: EF > 55%, MIld Inferior HK, Mild AoV Sclerosis.  . Vocal cord polyp 2005    Past Surgical History:  Procedure Laterality Date  . ABDOMINAL HYSTERECTOMY    . APPENDECTOMY  07/04/2000   Normal appendix, adhesions noted.  Marland Kitchen. BACK SURGERY     cyst on spinal cord - lumbar  . BREAST BIOPSY Right 01/31/1999   fibrocystic changes, ductal adenosis, focal atypical ductal epithelium.  Marland Kitchen. CARDIAC CATHETERIZATION  03/08/2011   improved flow from the PCI  . CHOLECYSTECTOMY  03/25/1998   Dr. Lemar LivingsByrnett, chronic cholecystitis.  . COLONOSCOPY WITH PROPOFOL N/A 09/28/2014   Procedure: COLONOSCOPY WITH PROPOFOL;  Surgeon: Earline MayotteJeffrey W Byrnett, MD;  [facility];  Service: Endoscopy;  Laterality: N/A;  . CORONARY ANGIOPLASTY WITH STENT PLACEMENT  03/07/2011   inferior STEMI - RCA PROMUS 3.5 mm x 28 mm DES  (post Dil 3.7 distal to 4.2 prox)  . DENTAL SURGERY  2016  . DOPPLER ECHOCARDIOGRAPHY  03/27/2011   LV cavity is small,EF =>55%,MILD INFERIOR HYPOKINESIS; Mild Aortic Sclerosis  . ESOPHAGOGASTRODUODENOSCOPY N/A 09/28/2014   Procedure: ESOPHAGOGASTRODUODENOSCOPY (EGD);  Surgeon: Earline MayotteJeffrey W Byrnett, MD;  [facility];  Service: Endoscopy;  Laterality: N/A;  . EYE SURGERY  blepheroplasty- bilateral  . JOINT REPLACEMENT    . KNEE SURGERY Left 2015  . KNEE SURGERY Right 2010   Baker cyst  . LEFT HEART CATHETERIZATION WITH CORONARY ANGIOGRAM N/A 03/07/2011   Procedure: LEFT HEART CATHETERIZATION WITH CORONARY ANGIOGRAM;  Surgeon: Marykay Lex, MD;  Location: [facility] CATH LAB;  Service: Cardiovascular;  Laterality: N/A;  . LEFT HEART CATHETERIZATION WITH CORONARY ANGIOGRAM N/A 03/08/2011   Procedure: LEFT HEART CATHETERIZATION WITH CORONARY ANGIOGRAM;  Surgeon: Marykay Lex, MD;  Location: [facility] CATH LAB;  Service: Cardiovascular;  Laterality: N/A;   . NM MYOCAR PERF WALL MOTION  04/20/2011   EF 81% (due to small LVcavity),fixed basal to mid inferior INFARCT - NO ISCHEMIA; 10 METs  . skin nodule Left 09/11/1999   dermatofibroma left lower leg, positive lateral margin.  Marland Kitchen TOTAL KNEE ARTHROPLASTY Bilateral 07/20/2015   Procedure: TOTAL KNEE BILATERAL;  Surgeon: Loreta Ave, MD;  Location: [facility] OR;  Service: Orthopedics;  Laterality: Bilateral;  . UPPER GI ENDOSCOPY  02-22-03   Dr. Lemar Livings, normal   Family History:  Family History  Problem Relation Age of Onset  . Heart attack Mother   . Coronary artery disease Mother   . Stroke Mother   . Depression Mother   . Anxiety disorder Mother   . Alzheimer's disease Father   . Stroke Father   . Breast cancer Sister 35  . Depression Brother   . Drug abuse Brother   . Bipolar disorder Sister    Social History:  Social History   Social History  . Marital status: Married    Spouse name: N/A  . Number of children: N/A  . Years of education: N/A   Social History Main Topics  . Smoking status: Former Smoker    Packs/day: 0.25    Years: 40.00    Types: Cigarettes    Quit date: 03/07/2011  . Smokeless tobacco: Never Used  . Alcohol use No  . Drug use: No  . Sexual activity: Yes   Other Topics Concern  . None   Social History Narrative   Divorced woman -- Now Remarried to her long-term partner.   Exercises routinely on a daily basis roughly 45 minutes a day walking.  Quit smoking at the time of her MI. Does not drink.   Additional History:   Assessment:   Musculoskeletal: Strength & Muscle Tone: within normal limits Gait & Station: normal Patient leans: N/A  Psychiatric Specialty Exam: Medication Refill   Depression         Associated symptoms include does not have insomnia and no suicidal ideas.   Review of Systems  Psychiatric/Behavioral: Negative for depression, hallucinations, memory loss, substance abuse and suicidal ideas. The patient is not nervous/anxious  and does not have insomnia.   All other systems reviewed and are negative.   Blood pressure 129/73, pulse (!) 57, temperature 98.4 F (36.9 C), temperature source Oral, weight 163 lb 5.8 oz (74.1 kg).Body mass index is 25.59 kg/m.  General Appearance: Neat and Well Groomed  Eye Contact:  Good  Speech:  Normal Rate  Volume:  Normal  Mood:  Much improved   Affect:  Smiling   Thought Process:  Linear  Orientation:  Full (Time, Place, and Person)  Thought Content:  Negative  Suicidal Thoughts:  No  Homicidal Thoughts:  No  Memory:  Immediate;   Good Recent;   Good Remote;   Good  Judgement:  Good  Insight:  Good  Psychomotor Activity:  Negative  Concentration:  Good  Recall:  Good  Fund of Knowledge: Negative  Language: Good  Akathisia:  Negative  Handed:  Right unknown   AIMS (if indicated):  NA  Assets:  Communication Skills Desire for Improvement  ADL's:  Intact  Cognition: WNL  Sleep:  good   Is the patient at risk to self?  No. Has the patient been a risk to self in the past 6 months?  No. Has the patient been a risk to self within the distant past?  No. Is the patient a risk to others?  No. Has the patient been a risk to others in the past 6 months?  No. Has the patient been a risk to others within the distant past?  No.  Current Medications: Current Outpatient Prescriptions  Medication Sig Dispense Refill  . atorvastatin (LIPITOR) 10 MG tablet Take 1 tablet (10 mg total) by mouth daily. 30 tablet 11  . benazepril (LOTENSIN) 5 MG tablet Take 1 tablet (5 mg total) by mouth daily before breakfast. 90 tablet 2  . buPROPion (WELLBUTRIN XL) 300 MG 24 hr tablet Take 1 tablet (300 mg total) by mouth every morning. 90 tablet 0  . clopidogrel (PLAVIX) 75 MG tablet Take 1 tablet (75 mg total) by mouth daily. 30 tablet 1  . Evolocumab (REPATHA SURECLICK) 140 MG/ML SOAJ Inject 1 pen into the skin every 14 (fourteen) days. 2 pen 11  . FLUoxetine (PROZAC) 10 MG capsule Take 1  capsule (10 mg total) by mouth daily. 30 capsule 2  . FLUoxetine (PROZAC) 40 MG capsule Take 1 capsule (40 mg total) by mouth daily before breakfast. 90 capsule 0  . gabapentin (NEURONTIN) 300 MG capsule TAKE 2 CAPSULES EVERY NIGHT 180 capsule 2  . metoprolol (LOPRESSOR) 50 MG tablet Take 1 tablet (50 mg total) by mouth 2 (two) times daily. 180 tablet 2  . nitroGLYCERIN (NITROSTAT) 0.4 MG SL tablet Place 1 tablet (0.4 mg total) under the tongue every 5 (five) minutes as needed for chest pain (up to 3 tabs in 15 mins and then call 911). 25 tablet 1  . omeprazole (PRILOSEC OTC) 20 MG tablet Take 20 mg by mouth daily before breakfast.     . traZODone (DESYREL) 50 MG tablet Take 1 tablet (50 mg total) by mouth at bedtime. 30 tablet 1   No current facility-administered medications for this visit.     Medical Decision Making:  Established Problem, Stable/Improving (1) and Review of Medication Regimen & Side Effects (2)  Treatment Plan Summary:Medication management and Plan   Major depressive disorder, recurrent, moderate- We will continue Wellbutrin XL 300 mg daily. Increase Prozac to 50mg  in the morning, adding 10mg  to the 40mg . Patient given a list of therapists to start therapy to address the Excessive guilt.  Insomnia Continue trazodone at 50 mg at bedtime as needed for sleep  Patient will follow up in 1 month.   Aneesh Faller 06/20/2016, 11:22 AM

## 2016-06-25 ENCOUNTER — Telehealth: Payer: Self-pay | Admitting: Family Medicine

## 2016-06-25 DIAGNOSIS — M9979 Connective tissue and disc stenosis of intervertebral foramina of abdomen and other regions: Secondary | ICD-10-CM

## 2016-06-25 MED ORDER — GABAPENTIN 300 MG PO CAPS: ORAL_CAPSULE | ORAL | 2 refills | Status: DC

## 2016-06-25 NOTE — Telephone Encounter (Signed)
Patient is changing pharmacies and she needs the Gabapentin   300 mg. called in to [facility] [address].

## 2016-07-18 ENCOUNTER — Encounter: Payer: Self-pay | Admitting: Psychiatry

## 2016-07-18 ENCOUNTER — Ambulatory Visit (INDEPENDENT_AMBULATORY_CARE_PROVIDER_SITE_OTHER): Payer: BLUE CROSS/BLUE SHIELD | Admitting: Psychiatry

## 2016-07-18 VITALS — BP 118/74 | HR 59 | Temp 98.2°F | Wt 164.8 lb

## 2016-07-18 DIAGNOSIS — Z634 Disappearance and death of family member: Secondary | ICD-10-CM | POA: Diagnosis not present

## 2016-07-18 DIAGNOSIS — F331 Major depressive disorder, recurrent, moderate: Secondary | ICD-10-CM

## 2016-07-18 MED ORDER — TRAZODONE HCL 50 MG PO TABS: 50.0000 mg | ORAL_TABLET | Freq: Every day | ORAL | 1 refills | Status: DC

## 2016-07-18 MED ORDER — BUPROPION HCL ER (XL) 300 MG PO TB24: 300.0000 mg | ORAL_TABLET | ORAL | 0 refills | Status: DC

## 2016-07-18 MED ORDER — FLUOXETINE HCL 40 MG PO CAPS: 40.0000 mg | ORAL_CAPSULE | Freq: Every day | ORAL | 0 refills | Status: DC

## 2016-07-18 MED ORDER — FLUOXETINE HCL 10 MG PO CAPS: 10.0000 mg | ORAL_CAPSULE | Freq: Every day | ORAL | 2 refills | Status: DC

## 2016-07-18 NOTE — Progress Notes (Signed)
Patient ID: Jessica Landry, female   DOB: 1955/11/13, 61 y.o.   MRN: [id number]   [facility] MD/PA/NP OP Progress Note  07/18/2016 11:13 AM JENAVIVE LAMBOY  MRN:  [id number]  Subjective:  Patient returns for follow-up of her major depressive disorder, recurrent moderate. Patient today reports that she has been able to tolerate the Prozac at the higher dosage quite well. States that she's had more energy and has been working in her yard. States that she was able to more her lawn yesterday and enjoyed it very much. States that she continues to have some trouble with down the death of her mother. She understands that she will benefit from therapy be discussed her seeing Felecia Jan since she felt like that she was able to connect with her. Reports she is sleeping okay with the help of trazodone on most days. Denies any suicidal thoughts.  Chief Complaint: Doing well Chief Complaint    Follow-up; Medication Refill     Visit Diagnosis:     ICD-9-CM ICD-10-CM   1. Major depressive disorder, recurrent episode, moderate (HCC) 296.32 F33.1   2. Bereavement V62.82 Z63.4     Past Medical History:  Past Medical History:  Diagnosis Date  . Anxiety   . Arthritis    knees & back   . CAD S/P percutaneous coronary angioplasty 03/07/2011   a) PCI of mRCA with Promus Element DES 3.5 mm x 28 mm (4.2 -- 3.7 mm); b) Myoview 04/2011: EF 81% (small LV Cavity), fixed basal-mid Inferior Infarct, No Ischemia, 10 METS  . Complication of anesthesia   . Depression 03/08/2011   Dr. Leory Plowman- [place], [facility]  . Essential hypertension 03/07/2011  . Family history of adverse reaction to anesthesia   . GERD (gastroesophageal reflux disease)   . Headache    chocolate & red wine triggers /w bad headaches   . History of back surgery   . Hyperlipidemia with target LDL less than 70 2012    Statin Intolerance (tried Lipitor & Crestor)  . Insomnia    without significant sleeping disorders  . Iron deficiency anemia 03/09/2011   . Neuropathy (HCC)   . PONV (postoperative nausea and vomiting)   . ST-segment elevation myocardial infarction (STEMI) of inferior wall (HCC) 03/07/2011   RCA Occlusion --> PCI of mRCA; ECHO 03/2011: EF > 55%, MIld Inferior HK, Mild AoV Sclerosis.  . Vocal cord polyp 2005    Past Surgical History:  Procedure Laterality Date  . ABDOMINAL HYSTERECTOMY    . APPENDECTOMY  07/04/2000   Normal appendix, adhesions noted.  Marland Kitchen BACK SURGERY     cyst on spinal cord - lumbar  . BREAST BIOPSY Right 01/31/1999   fibrocystic changes, ductal adenosis, focal atypical ductal epithelium.  Marland Kitchen CARDIAC CATHETERIZATION  03/08/2011   improved flow from the PCI  . CHOLECYSTECTOMY  03/25/1998   Dr. Lemar Livings, chronic cholecystitis.  . COLONOSCOPY WITH PROPOFOL N/A 09/28/2014   Procedure: COLONOSCOPY WITH PROPOFOL;  Surgeon: Earline Mayotte, MD;  [facility] ENDOSCOPY;  Service: Endoscopy;  Laterality: N/A;  . CORONARY ANGIOPLASTY WITH STENT PLACEMENT  03/07/2011   inferior STEMI - RCA PROMUS 3.5 mm x 28 mm DES  (post Dil 3.7 distal to 4.2 prox)  . DENTAL SURGERY  2016  . DOPPLER ECHOCARDIOGRAPHY  03/27/2011   LV cavity is small,EF =>55%,MILD INFERIOR HYPOKINESIS; Mild Aortic Sclerosis  . ESOPHAGOGASTRODUODENOSCOPY N/A 09/28/2014   Procedure: ESOPHAGOGASTRODUODENOSCOPY (EGD);  Surgeon: Earline Mayotte, MD;  Location: [facility] ENDOSCOPY;  Service: Endoscopy;  Laterality: N/A;  . EYE SURGERY     blepheroplasty- bilateral  . JOINT REPLACEMENT    . KNEE SURGERY Left 2015  . KNEE SURGERY Right 2010   Baker cyst  . LEFT HEART CATHETERIZATION WITH CORONARY ANGIOGRAM N/A 03/07/2011   Procedure: LEFT HEART CATHETERIZATION WITH CORONARY ANGIOGRAM;  Surgeon: Marykay Lex, MD;  Location: [facility] CATH LAB;  Service: Cardiovascular;  Laterality: N/A;  . LEFT HEART CATHETERIZATION WITH CORONARY ANGIOGRAM N/A 03/08/2011   Procedure: LEFT HEART CATHETERIZATION WITH CORONARY ANGIOGRAM;  Surgeon: Marykay Lex, MD;  Location:  [facility] CATH LAB;  Service: Cardiovascular;  Laterality: N/A;  . NM MYOCAR PERF WALL MOTION  04/20/2011   EF 81% (due to small LVcavity),fixed basal to mid inferior INFARCT - NO ISCHEMIA; 10 METs  . skin nodule Left 09/11/1999   dermatofibroma left lower leg, positive lateral margin.  Marland Kitchen TOTAL KNEE ARTHROPLASTY Bilateral 07/20/2015   Procedure: TOTAL KNEE BILATERAL;  Surgeon: Loreta Ave, MD;  Location: [facility] OR;  Service: Orthopedics;  Laterality: Bilateral;  . UPPER GI ENDOSCOPY  02-22-03   Dr. Lemar Livings, normal   Family History:  Family History  Problem Relation Age of Onset  . Heart attack Mother   . Coronary artery disease Mother   . Stroke Mother   . Depression Mother   . Anxiety disorder Mother   . Alzheimer's disease Father   . Stroke Father   . Breast cancer Sister 48  . Depression Brother   . Drug abuse Brother   . Bipolar disorder Sister    Social History:  Social History   Social History  . Marital status: Married    Spouse name: N/A  . Number of children: N/A  . Years of education: N/A   Social History Main Topics  . Smoking status: Former Smoker    Packs/day: 0.25    Years: 40.00    Types: Cigarettes    Quit date: 03/07/2011  . Smokeless tobacco: Never Used  . Alcohol use No  . Drug use: No  . Sexual activity: Yes   Other Topics Concern  . None   Social History Narrative   Divorced woman -- Now Remarried to her long-term partner.   Exercises routinely on a daily basis roughly 45 minutes a day walking.  Quit smoking at the time of her MI. Does not drink.   Additional History:   Assessment:   Musculoskeletal: Strength & Muscle Tone: within normal limits Gait & Station: normal Patient leans: N/A  Psychiatric Specialty Exam: Medication Refill   Depression         Associated symptoms include does not have insomnia and no suicidal ideas.   Review of Systems  Psychiatric/Behavioral: Negative for depression, hallucinations, memory loss, substance  abuse and suicidal ideas. The patient is not nervous/anxious and does not have insomnia.   All other systems reviewed and are negative.   Blood pressure 118/74, pulse (!) 59, temperature 98.2 F (36.8 C), temperature source Oral, weight 164 lb 12.8 oz (74.8 kg).Body mass index is 25.81 kg/m.  General Appearance: Neat and Well Groomed  Eye Contact:  Good  Speech:  Normal Rate  Volume:  Normal  Mood:  Doing well  Affect:  Smiling   Thought Process:  Linear  Orientation:  Full (Time, Place, and Person)  Thought Content:  Negative  Suicidal Thoughts:  No  Homicidal Thoughts:  No  Memory:  Immediate;   Good Recent;   Good Remote;   Good  Judgement:  Good  Insight:  Good  Psychomotor Activity:  Negative  Concentration:  Good  Recall:  Good  Fund of Knowledge: Negative  Language: Good  Akathisia:  Negative  Handed:  Right unknown   AIMS (if indicated):  NA  Assets:  Communication Skills Desire for Improvement  ADL's:  Intact  Cognition: WNL  Sleep:  good   Is the patient at risk to self?  No. Has the patient been a risk to self in the past 6 months?  No. Has the patient been a risk to self within the distant past?  No. Is the patient a risk to others?  No. Has the patient been a risk to others in the past 6 months?  No. Has the patient been a risk to others within the distant past?  No.  Current Medications: Current Outpatient Prescriptions  Medication Sig Dispense Refill  . atorvastatin (LIPITOR) 10 MG tablet Take 1 tablet (10 mg total) by mouth daily. 30 tablet 11  . benazepril (LOTENSIN) 5 MG tablet Take 1 tablet (5 mg total) by mouth daily before breakfast. 90 tablet 2  . buPROPion (WELLBUTRIN XL) 300 MG 24 hr tablet Take 1 tablet (300 mg total) by mouth every morning. 90 tablet 0  . clopidogrel (PLAVIX) 75 MG tablet Take 1 tablet (75 mg total) by mouth daily. 30 tablet 1  . Evolocumab (REPATHA SURECLICK) 140 MG/ML SOAJ Inject 1 pen into the skin every 14 (fourteen)  days. 2 pen 11  . FLUoxetine (PROZAC) 10 MG capsule Take 1 capsule (10 mg total) by mouth daily. 30 capsule 2  . FLUoxetine (PROZAC) 40 MG capsule Take 1 capsule (40 mg total) by mouth daily before breakfast. 90 capsule 0  . gabapentin (NEURONTIN) 300 MG capsule TAKE 2 CAPSULES EVERY NIGHT 180 capsule 2  . metoprolol (LOPRESSOR) 50 MG tablet Take 1 tablet (50 mg total) by mouth 2 (two) times daily. 180 tablet 2  . nitroGLYCERIN (NITROSTAT) 0.4 MG SL tablet Place 1 tablet (0.4 mg total) under the tongue every 5 (five) minutes as needed for chest pain (up to 3 tabs in 15 mins and then call 911). 25 tablet 1  . omeprazole (PRILOSEC OTC) 20 MG tablet Take 20 mg by mouth daily before breakfast.     . traZODone (DESYREL) 50 MG tablet Take 1 tablet (50 mg total) by mouth at bedtime. 30 tablet 1   No current facility-administered medications for this visit.     Medical Decision Making:  Established Problem, Stable/Improving (1) and Review of Medication Regimen & Side Effects (2)  Treatment Plan Summary:Medication management and Plan   Major depressive disorder, recurrent, moderate- We will continue Wellbutrin XL 300 mg daily. Continue Prozac at . Patient given a list of therapists to start therapy to address the Excessive guilt, patient to also check with that Felecia Jan.  Insomnia Continue trazodone at 50 mg at bedtime as needed for sleep  Patient will follow up in 3 months.  Arlinda Barcelona 07/18/2016, 11:13 AM

## 2016-07-20 ENCOUNTER — Telehealth: Payer: Self-pay | Admitting: Cardiology

## 2016-07-20 NOTE — Telephone Encounter (Signed)
New message     Pt C/O of Chest Pain: STAT if CP now or developed within 24 hours  1. Are you having CP right now? Yes- a little - pain level 1-10 : 1/2 has a high tolerance for pain.    2. Are you experiencing any other symptoms (ex. SOB, nausea, vomiting, sweating)? No   3. How long have you been experiencing CP? Last week    4. Is your CP continuous or coming and going? Coming / going    5. Have you taken Nitroglycerin? Yes - Monday  4/9  ?

## 2016-07-20 NOTE — Telephone Encounter (Signed)
Returned call to patient. She reports chest pain (heavy pressure under left breast) for the past week  -- no active chest pain at the moment -- sporadic episodes but are increasing in frequency/severity -- reports high pain tolerance  -- symptoms feel a little similar to what she experienced previously w/her heart attack Took NTG on 4/9 x1 dose w/relief Does not monitor BP or HR Only change to medical therapy since her last visit is that she has taken Repatha  Patient states she will be in [facility] Tuesday if she needs to come in for an EKG check or be seen  Informed her will send message to MD

## 2016-07-24 ENCOUNTER — Ambulatory Visit (INDEPENDENT_AMBULATORY_CARE_PROVIDER_SITE_OTHER): Payer: BLUE CROSS/BLUE SHIELD | Admitting: Cardiology

## 2016-07-24 ENCOUNTER — Encounter: Payer: Self-pay | Admitting: Cardiology

## 2016-07-24 VITALS — BP 130/80 | HR 53 | Ht 67.0 in | Wt 165.0 lb

## 2016-07-24 DIAGNOSIS — I2119 ST elevation (STEMI) myocardial infarction involving other coronary artery of inferior wall: Secondary | ICD-10-CM

## 2016-07-24 DIAGNOSIS — R55 Syncope and collapse: Secondary | ICD-10-CM

## 2016-07-24 DIAGNOSIS — E785 Hyperlipidemia, unspecified: Secondary | ICD-10-CM | POA: Diagnosis not present

## 2016-07-24 DIAGNOSIS — Z9861 Coronary angioplasty status: Secondary | ICD-10-CM | POA: Diagnosis not present

## 2016-07-24 DIAGNOSIS — I2510 Atherosclerotic heart disease of native coronary artery without angina pectoris: Secondary | ICD-10-CM | POA: Diagnosis not present

## 2016-07-24 DIAGNOSIS — I10 Essential (primary) hypertension: Secondary | ICD-10-CM

## 2016-07-24 DIAGNOSIS — R079 Chest pain, unspecified: Secondary | ICD-10-CM | POA: Diagnosis not present

## 2016-07-24 NOTE — Telephone Encounter (Signed)
Pt says she is waiting to hear something. She says she will be in [place] today and can come by the office if she needs to.

## 2016-07-24 NOTE — Progress Notes (Signed)
PCP: Lorie Phenix, MD  Clinic Note: Chief Complaint  Patient presents with  . Follow-up    pt c/o chest pain, pressure and tightess, ststes she passed out the other day     HPI: Jessica Landry is a 61 y.o. female with a PMH below who presents today for Work in visit to discuss a near syncopal episode and some chest discomfort. Her medical history is most notable for inferior MI status post PCI to the RCA 5.5 years ago.  Jessica Landry was last seen on 02/08/2016. She is doing very well with no complaint. She was recovering from her recent knee surgery. Was walking at least 6 miles a week. We referred her to CVRR, and she is now started on Repatha back in February. She has not had follow-up labs yet.  Recent Hospitalizations: none  Studies Reviewed: n/a  Interval History: Jessica Landry presents today mostly for reassurance. She has had 2 episodes over the last week of aching pressure underneath her left breast. Both occurred at rest - 1 sitting on the couch, and 1 while getting out of the car in the grocery store. Neither episode occurred with exertion. They're associated with only a sense of concern because it was similar to symptoms she had before her heart attack. She did not notice any dyspnea or malaise, diaphoresis etc. associated with pain. She had had a little indigestion prior to that. Otherwise no resting or exertional chest pain or pressure. No heart failure symptoms of PND, orthopnea or edema. She still does all her walking and exercises without dyspnea or chest pain. She may note a little bit reduced energy level She also has not had any rapid irregular heartbeats or palpitations. However this past weekend she was doing some cleaning and reached up to get something out of a cabinet while looking up and had a very brief blackout episode. She denied any sensation of palpitations. She did feel her balance scale and then she blacked out. She's never had any light this before, and was  really only out for a few seconds. Thankfully she did not hit her head or injure anything.   No TIA/amaurosis fugax symptoms. No melena, hematochezia, hematuria, or epstaxis. No claudication.  ROS: A comprehensive was performed. Review of Systems  Constitutional: Negative for malaise/fatigue.  Respiratory: Negative for cough, shortness of breath and wheezing.   Cardiovascular: Negative for palpitations.  Gastrointestinal: Positive for abdominal pain. Negative for nausea and vomiting.  Musculoskeletal: Positive for falls (with syncope).  Neurological: Positive for dizziness and loss of consciousness. Negative for weakness.       Per HPI  Psychiatric/Behavioral: Positive for depression (controlled). The patient is nervous/anxious (controlled).   All other systems reviewed and are negative.   Past Medical History:  Diagnosis Date  . Anxiety   . Arthritis    knees & back   . CAD S/P percutaneous coronary angioplasty 03/07/2011   a) PCI of mRCA with Promus Element DES 3.5 mm x 28 mm (4.2 -- 3.7 mm); b) Myoview 04/2011: EF 81% (small LV Cavity), fixed basal-mid Inferior Infarct, No Ischemia, 10 METS  . Complication of anesthesia   . Depression 03/08/2011   Dr. Leory Plowman- [place], [facility]  . Essential hypertension 03/07/2011  . Family history of adverse reaction to anesthesia   . GERD (gastroesophageal reflux disease)   . Headache    chocolate & red wine triggers /w bad headaches   . History of back surgery   . Hyperlipidemia with target LDL  less than 70 2012    Statin Intolerance (tried Lipitor & Crestor)  . Insomnia    without significant sleeping disorders  . Iron deficiency anemia 03/09/2011  . Neuropathy   . PONV (postoperative nausea and vomiting)   . ST-segment elevation myocardial infarction (STEMI) of inferior wall (HCC) 03/07/2011   RCA Occlusion --> PCI of mRCA; ECHO 03/2011: EF > 55%, MIld Inferior HK, Mild AoV Sclerosis.  . Vocal cord polyp 2005    Past Surgical  History:  Procedure Laterality Date  . ABDOMINAL HYSTERECTOMY    . APPENDECTOMY  07/04/2000   Normal appendix, adhesions noted.  Marland Kitchen BACK SURGERY     cyst on spinal cord - lumbar  . BREAST BIOPSY Right 01/31/1999   fibrocystic changes, ductal adenosis, focal atypical ductal epithelium.  Marland Kitchen CARDIAC CATHETERIZATION  03/08/2011   improved flow from the PCI  . CHOLECYSTECTOMY  03/25/1998   Dr. Lemar Livings, chronic cholecystitis.  . COLONOSCOPY WITH PROPOFOL N/A 09/28/2014   Procedure: COLONOSCOPY WITH PROPOFOL;  Surgeon: Earline Mayotte, MD;  Location: [facility] ENDOSCOPY;  Service: Endoscopy;  Laterality: N/A;  . CORONARY ANGIOPLASTY WITH STENT PLACEMENT  03/07/2011   inferior STEMI - RCA PROMUS 3.5 mm x 28 mm DES  (post Dil 3.7 distal to 4.2 prox)  . DENTAL SURGERY  2016  . DOPPLER ECHOCARDIOGRAPHY  03/27/2011   LV cavity is small,EF =>55%,MILD INFERIOR HYPOKINESIS; Mild Aortic Sclerosis  . ESOPHAGOGASTRODUODENOSCOPY N/A 09/28/2014   Procedure: ESOPHAGOGASTRODUODENOSCOPY (EGD);  Surgeon: Earline Mayotte, MD;  Location: [facility] ENDOSCOPY;  Service: Endoscopy;  Laterality: N/A;  . EYE SURGERY     blepheroplasty- bilateral  . JOINT REPLACEMENT    . KNEE SURGERY Left 2015  . KNEE SURGERY Right 2010   Baker cyst  . LEFT HEART CATHETERIZATION WITH CORONARY ANGIOGRAM N/A 03/07/2011   Procedure: LEFT HEART CATHETERIZATION WITH CORONARY ANGIOGRAM;  Surgeon: Marykay Lex, MD;  Location: [facility] CATH LAB;  Service: Cardiovascular;  Laterality: N/A;  . LEFT HEART CATHETERIZATION WITH CORONARY ANGIOGRAM N/A 03/08/2011   Procedure: LEFT HEART CATHETERIZATION WITH CORONARY ANGIOGRAM;  Surgeon: Marykay Lex, MD;  Location: [facility] CATH LAB;  Service: Cardiovascular;  Laterality: N/A;  . NM MYOCAR PERF WALL MOTION  04/20/2011   EF 81% (due to small LVcavity),fixed basal to mid inferior INFARCT - NO ISCHEMIA; 10 METs  . skin nodule Left 09/11/1999   dermatofibroma left lower leg, positive lateral margin.  Marland Kitchen TOTAL  KNEE ARTHROPLASTY Bilateral 07/20/2015   Procedure: TOTAL KNEE BILATERAL;  Surgeon: Loreta Ave, MD;  Location: [facility] OR;  Service: Orthopedics;  Laterality: Bilateral;  . UPPER GI ENDOSCOPY  02-22-03   Dr. Lemar Livings, normal    Current Meds  Medication Sig  . benazepril (LOTENSIN) 5 MG tablet Take 1 tablet (5 mg total) by mouth daily before breakfast.  . buPROPion (WELLBUTRIN XL) 300 MG 24 hr tablet Take 1 tablet (300 mg total) by mouth every morning.  . clopidogrel (PLAVIX) 75 MG tablet Take 1 tablet (75 mg total) by mouth daily.  . Evolocumab (REPATHA SURECLICK) 140 MG/ML SOAJ Inject 1 pen into the skin every 14 (fourteen) days.  Marland Kitchen FLUoxetine (PROZAC) 10 MG capsule Take 1 capsule (10 mg total) by mouth daily.  Marland Kitchen FLUoxetine (PROZAC) 40 MG capsule Take 1 capsule (40 mg total) by mouth daily before breakfast.  . gabapentin (NEURONTIN) 300 MG capsule TAKE 2 CAPSULES EVERY NIGHT  . metoprolol (LOPRESSOR) 50 MG tablet Take 1 tablet (50 mg total) by mouth 2 (two)  times daily.  . nitroGLYCERIN (NITROSTAT) 0.4 MG SL tablet Place 1 tablet (0.4 mg total) under the tongue every 5 (five) minutes as needed for chest pain (up to 3 tabs in 15 mins and then call 911).  Marland Kitchen omeprazole (PRILOSEC OTC) 20 MG tablet Take 20 mg by mouth daily before breakfast.   . traZODone (DESYREL) 50 MG tablet Take 1 tablet (50 mg total) by mouth at bedtime. (Patient taking differently: Take 25 mg by mouth as needed. )    Allergies  Allergen Reactions  . Bee Venom Anaphylaxis  . Statins Other (See Comments)    Myalgias & fatigue  . Etodolac   . Prednisone Palpitations and Other (See Comments)    Can take small amounts. HR increase; increased energy   . Sulfa Antibiotics Other (See Comments)    Elevated B/P, skin turns red    Social History   Social History  . Marital status: Married    Spouse name: N/A  . Number of children: N/A  . Years of education: N/A   Social History Main Topics  . Smoking status: Former  Smoker    Packs/day: 0.25    Years: 40.00    Types: Cigarettes    Quit date: 03/07/2011  . Smokeless tobacco: Never Used  . Alcohol use No  . Drug use: No  . Sexual activity: Yes   Other Topics Concern  . None   Social History Narrative   Divorced woman -- Now Remarried to her long-term partner.   Exercises routinely on a daily basis roughly 45 minutes a day walking.  Quit smoking at the time of her MI. Does not drink.    family history includes Alzheimer's disease in her father; Anxiety disorder in her mother; Bipolar disorder in her sister; Breast cancer (age of onset: 57) in her sister; Coronary artery disease in her mother; Depression in her brother and mother; Drug abuse in her brother; Heart attack in her mother; Stroke in her father and mother.  Wt Readings from Last 3 Encounters:  07/24/16 165 lb (74.8 kg)  02/08/16 159 lb (72.1 kg)  09/29/15 156 lb (70.8 kg)    PHYSICAL EXAM BP 130/80 (BP Location: Right Arm, Patient Position: Sitting, Cuff Size: Normal)   Pulse (!) 53   Ht  (1.702 m)   Wt 165 lb (74.8 kg)   BMI 25.84 kg/m  General appearance: alert, cooperative, appears stated age, no distress and well nourished, well groomed.   Neck: no adenopathy, no carotid bruit and no JVD Lungs: clear to auscultation bilaterally, normal percussion bilaterally and non-labored Heart: regular rate and rhythm, S1& S2 normal, no click, rub or gallop; Hash, early peaking 1/6 SEM @ RUSB--> carotids.  Abdomen: soft, non-tender; bowel sounds normal; no masses,  no organomegaly; no HJR Extremities: extremities normal, atraumatic, no cyanosis, or edema  Pulses: 2+ and symmetric;  Skin: mobility and turgor normal, no evidence of bleeding or bruising and no lesions noted  Neurologic: Mental status: Alert, oriented, thought content appropriate, pleasant mood & affect Cranial nerves: normal (II-XII grossly intact)    Adult ECG Report  Rate: 53 ;  Rhythm: sinus bradycardia and Normal  axis, intervals and durations;   Narrative Interpretation: stable EKG   Other studies Reviewed: Additional studies/ records that were reviewed today include:  Recent Labs:  Pre-Repatha labs from Feb 2018 reviewed    ASSESSMENT / PLAN: Problem List Items Addressed This Visit    Benign essential HTN (Chronic)    stable  CAD S/P PCI mRCA: Promus Element DES 3.5 mm x 28 mm (4.2-37 mm)) (Chronic)    1 stent in the RCA. On Plavix without aspirin. On beta blocker, ACE inhibitor and Repatha. We will refill her when necessary nitroglycerin. If she does have recurrent symptoms, we made feel warranted to check a surveillance Myoview.      Chest pain with low risk for cardiac etiology    2 episodes of chest discomfort occurring mostly at rest, and not exacerbated with exertion. No associated dyspnea or diaphoresis. Most likely this is noncardiac in nature. I reassured her. Her EKG is normal. Since the episodes she has been active without any recurrent symptoms. If symptoms recur or worsen, we'll do a stress test to be sure.      Hyperlipidemia with target LDL less than 70; Statin Intolerant (Chronic)    Stared on Repatha -f/u labs pending in ~1` month. Coninue to f/u with CVRR clinic ([facility] - run by Clinical Pharmacists).      ST elevation myocardial infarction (STEMI) of inferior wall, subsequent episode of care [facility]) (Chronic)    She recovered well, has maintained an active lifestyle now. Well-preserved EF on echo and nuclear stress test. Small area of infarct with preserved EF overall. Her little episodes of chest discomfort were consistent with angina based on timing and location as well as the fact that they were not then exacerbated with exertion. Continue beta blocker, ACE inhibitor and Plavix. On Repatha.      Relevant Orders   EKG 12-Lead   Syncope and collapse - Primary    TIMI this episode was probably the most concerning finding  from her complaints. The second occurred while looking up and raising her arm makes patient interested in excluding carotid artery disease. I think what I heard was irradiated bruit to her right carotid, but cannot be sure. We will check carotid Dopplers and also focusing on the vertebral arteries with flexion and extension. Looking for potential subclavian steal or vertebral artery stenosis which would be more involved with vertebrobasilar insufficiency. Could also be vertigo with different head positioning. Will start of the importance of staying hydrated. Neck/she will have her study done and if normal, we'll does follow up regularly. Otherwise her back sooner and likely refer her to Dr. Allyson Sabal.      Relevant Orders   EKG 12-Lead   VAS US CAROTID      Current medicines are reviewed at length with the patient today. (+/- concerns) n/a The following changes have been made: nA  Patient Instructions  SCH[address]LE [address] Your physician has requested that you have a carotid duplex CHECK VERTEBRAL'S ARTERY. This test is an ultrasound of the carotid arteries in your neck. It looks at blood flow through these arteries that supply the brain with blood. Allow one hour for this exam. There are no restrictions or special instructions.    Your physician recommends that you schedule a follow-up appointment  NOV. 2018    If you need a refill on your cardiac medications before your next appointment, please call your pharmacy.    Studies Ordered:   Orders Placed This Encounter  Procedures  . EKG 12-Lead      Bryan Lemma, M.D., M.S. Interventional Cardiologist   Pager # [phone] Phone # [phone] [address] [place], [place] [address]

## 2016-07-24 NOTE — Telephone Encounter (Signed)
Returned the call to the patient. Per Lamonte Richer Dr. Herbie Baltimore can see the patient today. The patient verbalized her understanding and stated that she was on the way.

## 2016-07-24 NOTE — Patient Instructions (Addendum)
SCHEDULE [address]. Your physician has requested that you have a carotid duplex CHECK VERTEBRAL'S ARTERY. This test is an ultrasound of the carotid arteries in your neck. It looks at blood flow through these arteries that supply the brain with blood. Allow one hour for this exam. There are no restrictions or special instructions.    Your physician recommends that you schedule a follow-up appointment  NOV. 2018    If you need a refill on your cardiac medications before your next appointment, please call your pharmacy.

## 2016-07-25 ENCOUNTER — Other Ambulatory Visit: Payer: Self-pay | Admitting: *Deleted

## 2016-07-25 DIAGNOSIS — R55 Syncope and collapse: Secondary | ICD-10-CM | POA: Insufficient documentation

## 2016-07-25 MED ORDER — NITROGLYCERIN 0.4 MG SL SUBL: 0.4000 mg | SUBLINGUAL_TABLET | SUBLINGUAL | 1 refills | Status: DC | PRN

## 2016-07-25 NOTE — Assessment & Plan Note (Signed)
stable °

## 2016-07-25 NOTE — Assessment & Plan Note (Signed)
She recovered well, has maintained an active lifestyle now. Well-preserved EF on echo and nuclear stress test. Small area of infarct with preserved EF overall. Her little episodes of chest discomfort were consistent with angina based on timing and location as well as the fact that they were not then exacerbated with exertion. Continue beta blocker, ACE inhibitor and Plavix. On Repatha.

## 2016-07-25 NOTE — Assessment & Plan Note (Signed)
TIMI this episode was probably the most concerning finding from her complaints. The second occurred while looking up and raising her arm makes patient interested in excluding carotid artery disease. I think what I heard was irradiated bruit to her right carotid, but cannot be sure. We will check carotid Dopplers and also focusing on the vertebral arteries with flexion and extension. Looking for potential subclavian steal or vertebral artery stenosis which would be more involved with vertebrobasilar insufficiency. Could also be vertigo with different head positioning. Will start of the importance of staying hydrated. Neck/she will have her study done and if normal, we'll does follow up regularly. Otherwise her back sooner and likely refer her to Dr. Allyson Sabal.

## 2016-07-25 NOTE — Assessment & Plan Note (Signed)
1 stent in the RCA. On Plavix without aspirin. On beta blocker, ACE inhibitor and Repatha. We will refill her when necessary nitroglycerin. If she does have recurrent symptoms, we made feel warranted to check a surveillance Myoview.

## 2016-07-25 NOTE — Assessment & Plan Note (Signed)
Stared on Repatha -f/u labs pending in ~1` month. Coninue to f/u with CVRR clinic ([facility] - run by Clinical Pharmacists).

## 2016-07-25 NOTE — Assessment & Plan Note (Signed)
2 episodes of chest discomfort occurring mostly at rest, and not exacerbated with exertion. No associated dyspnea or diaphoresis. Most likely this is noncardiac in nature. I reassured her. Her EKG is normal. Since the episodes she has been active without any recurrent symptoms. If symptoms recur or worsen, we'll do a stress test to be sure.

## 2016-07-26 NOTE — Telephone Encounter (Signed)
SPOKE PATIENT MEDICATION FILLED YESTERDAY

## 2016-08-07 HISTORY — PX: NM MYOCAR PERF WALL MOTION: HXRAD629

## 2016-08-13 ENCOUNTER — Emergency Department: Payer: BLUE CROSS/BLUE SHIELD

## 2016-08-13 ENCOUNTER — Observation Stay
Admission: EM | Admit: 2016-08-13 | Discharge: 2016-08-14 | Disposition: A | Discharge status: Home / Self Care | Payer: BLUE CROSS/BLUE SHIELD | Attending: Internal Medicine | Admitting: Internal Medicine

## 2016-08-13 ENCOUNTER — Encounter (HOSPITAL_COMMUNITY): Payer: BLUE CROSS/BLUE SHIELD

## 2016-08-13 DIAGNOSIS — E663 Overweight: Secondary | ICD-10-CM | POA: Insufficient documentation

## 2016-08-13 DIAGNOSIS — Z8489 Family history of other specified conditions: Secondary | ICD-10-CM | POA: Insufficient documentation

## 2016-08-13 DIAGNOSIS — E7800 Pure hypercholesterolemia, unspecified: Secondary | ICD-10-CM | POA: Insufficient documentation

## 2016-08-13 DIAGNOSIS — D539 Nutritional anemia, unspecified: Secondary | ICD-10-CM | POA: Insufficient documentation

## 2016-08-13 DIAGNOSIS — G43909 Migraine, unspecified, not intractable, without status migrainosus: Secondary | ICD-10-CM | POA: Diagnosis not present

## 2016-08-13 DIAGNOSIS — Z888 Allergy status to other drugs, medicaments and biological substances status: Secondary | ICD-10-CM | POA: Insufficient documentation

## 2016-08-13 DIAGNOSIS — M109 Gout, unspecified: Secondary | ICD-10-CM | POA: Diagnosis not present

## 2016-08-13 DIAGNOSIS — Z955 Presence of coronary angioplasty implant and graft: Secondary | ICD-10-CM | POA: Insufficient documentation

## 2016-08-13 DIAGNOSIS — I2510 Atherosclerotic heart disease of native coronary artery without angina pectoris: Secondary | ICD-10-CM | POA: Diagnosis not present

## 2016-08-13 DIAGNOSIS — M170 Bilateral primary osteoarthritis of knee: Secondary | ICD-10-CM | POA: Insufficient documentation

## 2016-08-13 DIAGNOSIS — G4700 Insomnia, unspecified: Secondary | ICD-10-CM | POA: Diagnosis not present

## 2016-08-13 DIAGNOSIS — R0789 Other chest pain: Principal | ICD-10-CM | POA: Insufficient documentation

## 2016-08-13 DIAGNOSIS — F419 Anxiety disorder, unspecified: Secondary | ICD-10-CM | POA: Insufficient documentation

## 2016-08-13 DIAGNOSIS — L03319 Cellulitis of trunk, unspecified: Secondary | ICD-10-CM | POA: Insufficient documentation

## 2016-08-13 DIAGNOSIS — K5900 Constipation, unspecified: Secondary | ICD-10-CM | POA: Insufficient documentation

## 2016-08-13 DIAGNOSIS — I10 Essential (primary) hypertension: Secondary | ICD-10-CM | POA: Insufficient documentation

## 2016-08-13 DIAGNOSIS — Z90710 Acquired absence of both cervix and uterus: Secondary | ICD-10-CM | POA: Insufficient documentation

## 2016-08-13 DIAGNOSIS — Z818 Family history of other mental and behavioral disorders: Secondary | ICD-10-CM | POA: Insufficient documentation

## 2016-08-13 DIAGNOSIS — Z91030 Bee allergy status: Secondary | ICD-10-CM | POA: Diagnosis not present

## 2016-08-13 DIAGNOSIS — Z8249 Family history of ischemic heart disease and other diseases of the circulatory system: Secondary | ICD-10-CM | POA: Insufficient documentation

## 2016-08-13 DIAGNOSIS — E785 Hyperlipidemia, unspecified: Secondary | ICD-10-CM | POA: Diagnosis not present

## 2016-08-13 DIAGNOSIS — J309 Allergic rhinitis, unspecified: Secondary | ICD-10-CM | POA: Diagnosis not present

## 2016-08-13 DIAGNOSIS — K219 Gastro-esophageal reflux disease without esophagitis: Secondary | ICD-10-CM | POA: Insufficient documentation

## 2016-08-13 DIAGNOSIS — Z882 Allergy status to sulfonamides status: Secondary | ICD-10-CM | POA: Diagnosis not present

## 2016-08-13 DIAGNOSIS — I252 Old myocardial infarction: Secondary | ICD-10-CM | POA: Insufficient documentation

## 2016-08-13 DIAGNOSIS — M479 Spondylosis, unspecified: Secondary | ICD-10-CM | POA: Diagnosis not present

## 2016-08-13 DIAGNOSIS — Z803 Family history of malignant neoplasm of breast: Secondary | ICD-10-CM | POA: Insufficient documentation

## 2016-08-13 DIAGNOSIS — Z6825 Body mass index (BMI) 25.0-25.9, adult: Secondary | ICD-10-CM | POA: Insufficient documentation

## 2016-08-13 DIAGNOSIS — R079 Chest pain, unspecified: Secondary | ICD-10-CM | POA: Diagnosis not present

## 2016-08-13 DIAGNOSIS — M9979 Connective tissue and disc stenosis of intervertebral foramina of abdomen and other regions: Secondary | ICD-10-CM

## 2016-08-13 DIAGNOSIS — F329 Major depressive disorder, single episode, unspecified: Secondary | ICD-10-CM | POA: Insufficient documentation

## 2016-08-13 DIAGNOSIS — Z96653 Presence of artificial knee joint, bilateral: Secondary | ICD-10-CM | POA: Insufficient documentation

## 2016-08-13 DIAGNOSIS — F17210 Nicotine dependence, cigarettes, uncomplicated: Secondary | ICD-10-CM | POA: Insufficient documentation

## 2016-08-13 DIAGNOSIS — Z7902 Long term (current) use of antithrombotics/antiplatelets: Secondary | ICD-10-CM | POA: Insufficient documentation

## 2016-08-13 DIAGNOSIS — Z823 Family history of stroke: Secondary | ICD-10-CM | POA: Insufficient documentation

## 2016-08-13 DIAGNOSIS — Z820 Family history of epilepsy and other diseases of the nervous system: Secondary | ICD-10-CM | POA: Insufficient documentation

## 2016-08-13 DIAGNOSIS — D509 Iron deficiency anemia, unspecified: Secondary | ICD-10-CM | POA: Insufficient documentation

## 2016-08-13 DIAGNOSIS — Z9049 Acquired absence of other specified parts of digestive tract: Secondary | ICD-10-CM | POA: Insufficient documentation

## 2016-08-13 LAB — CBC
HCT: 37.6 % (ref 35.0–47.0)
HEMOGLOBIN: 12.7 g/dL (ref 12.0–16.0)
MCH: 30.4 pg (ref 26.0–34.0)
MCHC: 33.9 g/dL (ref 32.0–36.0)
MCV: 89.6 fL (ref 80.0–100.0)
Platelets: 259 10*3/uL (ref 150–440)
RBC: 4.19 MIL/uL (ref 3.80–5.20)
RDW: 13.1 % (ref 11.5–14.5)
WBC: 5.8 10*3/uL (ref 3.6–11.0)

## 2016-08-13 LAB — TROPONIN I
Troponin I: <0.03 ng/mL (ref <0.03)
Troponin I: <0.03 ng/mL (ref <0.03)

## 2016-08-13 LAB — BASIC METABOLIC PANEL
ANION GAP: 8 (ref 5–15)
BUN: 13 mg/dL (ref 6–20)
CHLORIDE: 103 mmol/L (ref 101–111)
CO2: 26 mmol/L (ref 22–32)
Calcium: 9 mg/dL (ref 8.9–10.3)
Creatinine, Ser: 1.05 mg/dL — ABNORMAL HIGH (ref 0.44–1.00)
GFR calc non Af Amer: 56 mL/min — ABNORMAL LOW (ref >60)
Glucose, Bld: 112 mg/dL — ABNORMAL HIGH (ref 65–99)
POTASSIUM: 4.2 mmol/L (ref 3.5–5.1)
Sodium: 137 mmol/L (ref 135–145)

## 2016-08-13 LAB — HEPATIC FUNCTION PANEL
ALK PHOS: 73 U/L (ref 38–126)
ALT: 21 U/L (ref 14–54)
AST: 29 U/L (ref 15–41)
Albumin: 3.9 g/dL (ref 3.5–5.0)
BILIRUBIN TOTAL: 0.5 mg/dL (ref 0.3–1.2)
Bilirubin, Direct: <0.1 mg/dL — ABNORMAL LOW (ref 0.1–0.5)
Total Protein: 6.7 g/dL (ref 6.5–8.1)

## 2016-08-13 LAB — LIPASE, BLOOD: Lipase: 23 U/L (ref 11–51)

## 2016-08-13 MED ORDER — FLUOXETINE HCL 20 MG PO CAPS
40.0000 mg | ORAL_CAPSULE | Freq: Every day | ORAL | Status: DC
Start: 2016-08-14 — End: 2016-08-14
  Administered 2016-08-14: 40 mg via ORAL
  Filled 2016-08-13: qty 2

## 2016-08-13 MED ORDER — HEPARIN SODIUM (PORCINE) 5000 UNIT/ML IJ SOLN
5000.0000 [IU] | Freq: Three times a day (TID) | INTRAMUSCULAR | Status: DC
  Administered 2016-08-13 – 2016-08-14 (×3): 5000 [IU] via SUBCUTANEOUS
  Filled 2016-08-13 (×3): qty 1

## 2016-08-13 MED ORDER — METOPROLOL TARTRATE 50 MG PO TABS: 50.0000 mg | ORAL_TABLET | Freq: Two times a day (BID) | ORAL | Status: DC

## 2016-08-13 MED ORDER — NITROGLYCERIN 0.4 MG SL SUBL: 0.4000 mg | SUBLINGUAL_TABLET | SUBLINGUAL | Status: DC | PRN

## 2016-08-13 MED ORDER — ARIPIPRAZOLE 10 MG PO TABS: 5.0000 mg | ORAL_TABLET | Freq: Every day | ORAL | Status: DC

## 2016-08-13 MED ORDER — BENAZEPRIL HCL 5 MG PO TABS
5.0000 mg | ORAL_TABLET | Freq: Every day | ORAL | Status: DC
  Administered 2016-08-14: 5 mg via ORAL
  Filled 2016-08-13: qty 1

## 2016-08-13 MED ORDER — BUPROPION HCL ER (XL) 150 MG PO TB24
300.0000 mg | ORAL_TABLET | Freq: Every day | ORAL | Status: DC
  Administered 2016-08-14: 300 mg via ORAL
  Filled 2016-08-13: qty 2

## 2016-08-13 MED ORDER — OMEPRAZOLE MAGNESIUM 20 MG PO TBEC: 20.0000 mg | DELAYED_RELEASE_TABLET | Freq: Every day | ORAL | Status: DC

## 2016-08-13 MED ORDER — TRAZODONE HCL 50 MG PO TABS
50.0000 mg | ORAL_TABLET | Freq: Every day | ORAL | Status: DC
  Administered 2016-08-13: 50 mg via ORAL
  Filled 2016-08-13: qty 1

## 2016-08-13 MED ORDER — VENLAFAXINE HCL ER 75 MG PO CP24: 225.0000 mg | ORAL_CAPSULE | Freq: Every day | ORAL | Status: DC

## 2016-08-13 MED ORDER — PANTOPRAZOLE SODIUM 40 MG PO TBEC
40.0000 mg | DELAYED_RELEASE_TABLET | Freq: Every day | ORAL | Status: DC
  Administered 2016-08-14: 40 mg via ORAL
  Filled 2016-08-13: qty 1

## 2016-08-13 MED ORDER — ALUM & MAG HYDROXIDE-SIMETH 200-200-20 MG/5ML PO SUSP
30.0000 mL | Freq: Once | ORAL | Status: AC
  Administered 2016-08-13: 30 mL via ORAL
  Filled 2016-08-13: qty 30

## 2016-08-13 MED ORDER — DOCUSATE SODIUM 100 MG PO CAPS: 100.0000 mg | ORAL_CAPSULE | Freq: Two times a day (BID) | ORAL | Status: DC | PRN

## 2016-08-13 MED ORDER — GI COCKTAIL ~~LOC~~
30.0000 mL | Freq: Once | ORAL | Status: AC
  Administered 2016-08-13: 30 mL via ORAL
  Filled 2016-08-13: qty 30

## 2016-08-13 MED ORDER — GABAPENTIN 300 MG PO CAPS
600.0000 mg | ORAL_CAPSULE | Freq: Every day | ORAL | Status: DC
  Administered 2016-08-13: 600 mg via ORAL
  Filled 2016-08-13: qty 2

## 2016-08-13 MED ORDER — CLOPIDOGREL BISULFATE 75 MG PO TABS
75.0000 mg | ORAL_TABLET | Freq: Every day | ORAL | Status: DC
  Administered 2016-08-14: 75 mg via ORAL
  Filled 2016-08-13: qty 1

## 2016-08-13 MED ORDER — FLUOXETINE HCL 10 MG PO CAPS
10.0000 mg | ORAL_CAPSULE | Freq: Every day | ORAL | Status: DC
  Administered 2016-08-14: 10 mg via ORAL
  Filled 2016-08-13: qty 1

## 2016-08-13 NOTE — ED Triage Notes (Signed)
Pt here via [facility] with c/o substernal chest pain that started at 0730 this AM. Pt took 2 nitro PTA, EMS gave 324mg  of aspirin.

## 2016-08-13 NOTE — ED Provider Notes (Signed)
[facility] Emergency Department Provider Note  ____________________________________________   I have reviewed the triage vital signs and the nursing notes.   HISTORY  Chief Complaint Chest Pain    HPI Jessica Landry is a 61 y.o. female who is an excessive history of CAD presents today complaining of chest pain. She has had it off-and-on for the last 6 weeks but was worse this point. It was substernal, nonradiating, and some nitroglycerin and it seemed to get somewhat better, did have aspirin nothing made it worse, not necessarily exertional.At this time as a 2 out of 10. She did feel somewhat short of breath with it. She has seen her cardiologist for her ongoing recurrent chest pain.  She denies fever chills or cough.   Past Medical History:  Diagnosis Date  . Anxiety   . Arthritis    knees & back   . CAD S/P percutaneous coronary angioplasty 03/07/2011   a) PCI of mRCA with Promus Element DES 3.5 mm x 28 mm (4.2 -- 3.7 mm); b) Myoview 04/2011: EF 81% (small LV Cavity), fixed basal-mid Inferior Infarct, No Ischemia, 10 METS  . Complication of anesthesia   . Depression 03/08/2011   Dr. Leory Plowman- [place], [facility]  . Essential hypertension 03/07/2011  . Family history of adverse reaction to anesthesia   . GERD (gastroesophageal reflux disease)   . Headache    chocolate & red wine triggers /w bad headaches   . History of back surgery   . Hyperlipidemia with target LDL less than 70 2012    Statin Intolerance (tried Lipitor & Crestor)  . Insomnia    without significant sleeping disorders  . Iron deficiency anemia 03/09/2011  . Neuropathy   . PONV (postoperative nausea and vomiting)   . ST-segment elevation myocardial infarction (STEMI) of inferior wall (HCC) 03/07/2011   RCA Occlusion --> PCI of mRCA; ECHO 03/2011: EF > 55%, MIld Inferior HK, Mild AoV Sclerosis.  . Vocal cord polyp 2005    Patient Active Problem List   Diagnosis Date Noted  . Syncope  and collapse 07/25/2016  . Hypocalcemia 09/29/2015  . Status post total bilateral knee replacement 07/20/2015  . Clinical depression 06/07/2015  . HLD (hyperlipidemia) 06/07/2015  . Allergic rhinitis 03/30/2015  . Absolute anemia 03/30/2015  . CN (constipation) 03/30/2015  . History of tobacco use 03/30/2015  . Adenopathy 03/30/2015  . Memory loss, short term 03/30/2015  . Headache, migraine 03/30/2015  . Panaritium of toe 03/30/2015  . Post menopausal syndrome 03/30/2015  . Cannot sleep 03/15/2015  . Chest pain with low risk for cardiac etiology 02/25/2015  . SOB (shortness of breath) 02/25/2015  . Allergic to bees 12/07/2014  . Anxiety 12/07/2014  . Gout 12/07/2014  . BP (high blood pressure) 12/07/2014  . Overweight (BMI 25.0-29.9) 11/23/2012  . History of tobacco abuse: Quit in November 2012 11/23/2011  . Presence of stent in right coronary artery 03/11/2011  . Iron deficiency anemia 03/09/2011  . CAD S/P PCI mRCA: Promus Element DES 3.5 mm x 28 mm (4.2-37 mm)) 03/08/2011    Class: Diagnosis of  . Hyperlipidemia with target LDL less than 70; Statin Intolerant 03/08/2011  . Previous back surgery 03/08/2011  . Depression 03/08/2011  . ST elevation myocardial infarction (STEMI) of inferior wall, subsequent episode of care (HCC) 03/07/2011    Class: Acute  . Contagious disease 10/15/2009  . Cellulitis of trunk 09/19/2009  . Dermatitis, purulent 03/07/2009  . Encounter for general adult medical examination without abnormal  findings 01/05/2009  . Pure hypercholesterolemia 01/05/2009  . Benign essential HTN 01/05/2009  . Acid reflux 01/05/2009  . Narrowing of intervertebral disc space 12/14/2008  . Herniation of nucleus pulposus 12/14/2008    Past Surgical History:  Procedure Laterality Date  . ABDOMINAL HYSTERECTOMY    . APPENDECTOMY  07/04/2000   Normal appendix, adhesions noted.  Marland Kitchen BACK SURGERY     cyst on spinal cord - lumbar  . BREAST BIOPSY Right 01/31/1999    fibrocystic changes, ductal adenosis, focal atypical ductal epithelium.  Marland Kitchen CARDIAC CATHETERIZATION  03/08/2011   improved flow from the PCI  . CHOLECYSTECTOMY  03/25/1998   Dr. Lemar Livings, chronic cholecystitis.  . COLONOSCOPY WITH PROPOFOL N/A 09/28/2014   Procedure: COLONOSCOPY WITH PROPOFOL;  Surgeon: Earline Mayotte, MD;  Location: [facility] ENDOSCOPY;  Service: Endoscopy;  Laterality: N/A;  . CORONARY ANGIOPLASTY WITH STENT PLACEMENT  03/07/2011   inferior STEMI - RCA PROMUS 3.5 mm x 28 mm DES  (post Dil 3.7 distal to 4.2 prox)  . DENTAL SURGERY  2016  . DOPPLER ECHOCARDIOGRAPHY  03/27/2011   LV cavity is small,EF =>55%,MILD INFERIOR HYPOKINESIS; Mild Aortic Sclerosis  . ESOPHAGOGASTRODUODENOSCOPY N/A 09/28/2014   Procedure: ESOPHAGOGASTRODUODENOSCOPY (EGD);  Surgeon: Earline Mayotte, MD;  Location: [facility] ENDOSCOPY;  Service: Endoscopy;  Laterality: N/A;  . EYE SURGERY     blepheroplasty- bilateral  . JOINT REPLACEMENT    . KNEE SURGERY Left 2015  . KNEE SURGERY Right 2010   Baker cyst  . LEFT HEART CATHETERIZATION WITH CORONARY ANGIOGRAM N/A 03/07/2011   Procedure: LEFT HEART CATHETERIZATION WITH CORONARY ANGIOGRAM;  Surgeon: Marykay Lex, MD;  Location: [facility] CATH LAB;  Service: Cardiovascular;  Laterality: N/A;  . LEFT HEART CATHETERIZATION WITH CORONARY ANGIOGRAM N/A 03/08/2011   Procedure: LEFT HEART CATHETERIZATION WITH CORONARY ANGIOGRAM;  Surgeon: Marykay Lex, MD;  Location: [facility] CATH LAB;  Service: Cardiovascular;  Laterality: N/A;  . NM MYOCAR PERF WALL MOTION  04/20/2011   EF 81% (due to small LVcavity),fixed basal to mid inferior INFARCT - NO ISCHEMIA; 10 METs  . skin nodule Left 09/11/1999   dermatofibroma left lower leg, positive lateral margin.  Marland Kitchen TOTAL KNEE ARTHROPLASTY Bilateral 07/20/2015   Procedure: TOTAL KNEE BILATERAL;  Surgeon: Loreta Ave, MD;  Location[facility] OR;  Service: Orthopedics;  Laterality: Bilateral;  . UPPER GI ENDOSCOPY  02-22-03   Dr. Lemar Livings,  normal    Prior to Admission medications   Medication Sig Start Date End Date Taking? Authorizing Provider  benazepril (LOTENSIN) 5 MG tablet Take 1 tablet (5 mg total) by mouth daily before breakfast. 09/14/15   Marykay Lex, MD  buPROPion (WELLBUTRIN XL) 300 MG 24 hr tablet Take 1 tablet (300 mg total) by mouth every morning. 07/18/16 07/11/17  Patrick North, MD  clopidogrel (PLAVIX) 75 MG tablet Take 1 tablet (75 mg total) by mouth daily. 04/16/16   Marykay Lex, MD  Evolocumab (REPATHA SURECLICK) 140 MG/ML SOAJ Inject 1 pen into the skin every 14 (fourteen) days. 06/06/16   Marykay Lex, MD  FLUoxetine (PROZAC) 10 MG capsule Take 1 capsule (10 mg total) by mouth daily. 07/18/16 07/18/17  Patrick North, MD  FLUoxetine (PROZAC) 40 MG capsule Take 1 capsule (40 mg total) by mouth daily before breakfast. 07/18/16 07/11/17  Patrick North, MD  gabapentin (NEURONTIN) 300 MG capsule TAKE 2 CAPSULES EVERY NIGHT 06/25/16   Malva Limes, MD  metoprolol (LOPRESSOR) 50 MG tablet Take 1 tablet (50 mg total) by  mouth 2 (two) times daily. 09/14/15   Marykay LexHarding, David W, MD  nitroGLYCERIN (NITROSTAT) 0.4 MG SL tablet Place 1 tablet (0.4 mg total) under the tongue every 5 (five) minutes as needed for chest pain (up to 3 tabs in 15 mins and then call 911). 07/25/16   Marykay LexHarding, David W, MD  omeprazole (PRILOSEC OTC) 20 MG tablet Take 20 mg by mouth daily before breakfast.     [provider]  traZODone (DESYREL) 50 MG tablet Take 1 tablet (50 mg total) by mouth at bedtime. Patient taking differently: Take 25 mg by mouth as needed.  07/18/16   Patrick Northavi, Himabindu, MD    Allergies Bee venom; Statins; Etodolac; Prednisone; and Sulfa antibiotics  Family History  Problem Relation Age of Onset  . Heart attack Mother   . Coronary artery disease Mother   . Stroke Mother   . Depression Mother   . Anxiety disorder Mother   . Alzheimer's disease Father   . Stroke Father   . Breast cancer Sister [id number]  .  Depression Brother   . Drug abuse Brother   . Bipolar disorder Sister     Social History Social History  Substance Use Topics  . Smoking status: Former Smoker    Packs/day: 0.25    Years: 40.00    Types: Cigarettes    Quit date: 03/07/2011  . Smokeless tobacco: Never Used  . Alcohol use No    Review of Systems Constitutional: No fever/chills Eyes: No visual changes. ENT: No sore throat. No stiff neck no neck pain Cardiovascular: Positive chest pain. Respiratory: Positive mild shortness of breath. Gastrointestinal:   no vomiting.  No diarrhea.  No constipation. Genitourinary: Negative for dysuria. Musculoskeletal: Negative lower extremity swelling Skin: Negative for rash. Neurological: Negative for severe headaches, focal weakness or numbness. 10-point ROS otherwise negative.  ____________________________________________   PHYSICAL EXAM:  VITAL SIGNS: ED Triage Vitals [08/13/16 0845]  Enc Vitals Group     BP (!) 155/86     Pulse Rate 60     Resp 18     Temp 98.1 F (36.7 C)     Temp Source Oral     SpO2 98 %     Weight 160 lb (72.6 kg)     Height 5\' 7"  (1.702 m)     Head Circumference      Peak Flow      Pain Score 6     Pain Loc      Pain Edu?      Excl. in GC?     Constitutional: Alert and oriented. Well appearing and in no acute distress. Eyes: Conjunctivae are normal. PERRL. EOMI. Head: Atraumatic. Nose: No congestion/rhinnorhea. Mouth/Throat: Mucous membranes are moist.  Oropharynx non-erythematous. Neck: No stridor.   Nontender with no meningismus Cardiovascular: Normal rate, regular rhythm. Grossly normal heart sounds.  Good peripheral circulation. Respiratory: Normal respiratory effort.  No retractions. Lungs CTAB. Abdominal: Soft and Slight epigastric discomfort noted. No distention. No guarding no rebound Back:  There is no focal tenderness or step off.  there is no midline tenderness there are no lesions noted. there is no CVA  tenderness Musculoskeletal: No lower extremity tenderness, no upper extremity tenderness. No joint effusions, no DVT signs strong distal pulses Mild symmetric edema Neurologic:  Normal speech and language. No gross focal neurologic deficits are appreciated.  Skin:  Skin is warm, dry and intact. No rash noted. Psychiatric: Mood and affect are normal. Speech and behavior are normal.  ____________________________________________  LABS (all labs ordered are listed, but only abnormal results are displayed)  Labs Reviewed  BASIC METABOLIC PANEL - Abnormal; Notable for the following:       Result Value   Glucose, Bld 112 (*)    Creatinine, Ser 1.05 (*)    GFR calc non Af Amer 56 (*)    All other components within normal limits  HEPATIC FUNCTION PANEL - Abnormal; Notable for the following:    Bilirubin, Direct <0.1 (*)    All other components within normal limits  CBC  TROPONIN I  LIPASE, BLOOD  TROPONIN I   ____________________________________________  EKG  I personally interpreted any EKGs ordered by me or triage Sinus rhythm rate 57, mild bradycardia noted. No acute ST elevation no ST depression nonspecific ST changes normal axis ____________________________________________  RADIOLOGY  I reviewed any imaging ordered by me or triage that were performed during my shift and, if possible, patient and/or family made aware of any abnormal findings. ____________________________________________   PROCEDURES  Procedure(s) performed: None  Procedures  Critical Care performed: None  ____________________________________________   INITIAL IMPRESSION / ASSESSMENT AND PLAN / ED COURSE  Pertinent labs & imaging results that were available during my care of the patient were reviewed by me and considered in my medical decision making (see chart for details).  Patient here with chest pain which is been getting worse over the last 6 weeks, it is not necessarily exertional sometimes it  happens at rest. Did seem to get mostly better with nitroglycerin glycerin. She is chest pain-free. She was still having a little chest discomfort today. I give her a GI cocktail which seemed to help but it is unclear if this is GI or not. Patient states she does have a history of reflux and often feels the same. Given her extensive history of cardiac problems however I will admit her for observation.    ____________________________________________   FINAL CLINICAL IMPRESSION(S) / ED DIAGNOSES  Final diagnoses:  None      This chart was dictated using voice recognition software.  Despite best efforts to proofread,  errors can occur which can change meaning.      Jeanmarie Plant, MD 08/13/16 1202

## 2016-08-13 NOTE — H&P (Signed)
[employer] [facility] [place] [facility]   PATIENT NAME: Jessica Landry    MR#:  [id number]  DATE OF BIRTH:  02-Jul-1955  DATE OF ADMISSION:  08/13/2016  PRIMARY CARE PHYSICIAN: Lorie Phenix, MD   REQUESTING/REFERRING PHYSICIAN: McShane  CHIEF COMPLAINT:   Chief Complaint  Patient presents with  . Chest Pain    HISTORY OF PRESENT ILLNESS: Jessica Landry  is a 61 y.o. female with a known history of Anxiety, coronary artery disease, depression, essential hypertension, headache, hyperlipidemia, insomnia, and deficiency anemia- taking all her medications regularly, came to emergency room with complaint of waking up with chest pain this morning which was from her neck to the center of the chest pressure-like and got some relief after taking 2 nitroglycerin tablets at home. Came to emergency room pain was resolved but then again started to come back little bit.  PAST MEDICAL HISTORY:   Past Medical History:  Diagnosis Date  . Anxiety   . Arthritis    knees & back   . CAD S/P percutaneous coronary angioplasty 03/07/2011   a) PCI of mRCA with Promus Element DES 3.5 mm x 28 mm (4.2 -- 3.7 mm); b) Myoview 04/2011: EF 81% (small LV Cavity), fixed basal-mid Inferior Infarct, No Ischemia, 10 METS  . Complication of anesthesia   . Depression 03/08/2011   Dr. Leory Plowman- [place], [facility]  . Essential hypertension 03/07/2011  . Family history of adverse reaction to anesthesia   . GERD (gastroesophageal reflux disease)   . Headache    chocolate & red wine triggers /w bad headaches   . History of back surgery   . Hyperlipidemia with target LDL less than 70 2012    Statin Intolerance (tried Lipitor & Crestor)  . Insomnia    without significant sleeping disorders  . Iron deficiency anemia 03/09/2011  . Neuropathy   . PONV (postoperative nausea and vomiting)   . ST-segment elevation myocardial infarction (STEMI) of inferior wall (HCC) 03/07/2011   RCA Occlusion --> PCI of mRCA; ECHO  03/2011: EF > 55%, MIld Inferior HK, Mild AoV Sclerosis.  . Vocal cord polyp 2005    PAST SURGICAL HISTORY: Past Surgical History:  Procedure Laterality Date  . ABDOMINAL HYSTERECTOMY    . APPENDECTOMY  07/04/2000   Normal appendix, adhesions noted.  Marland Kitchen BACK SURGERY     cyst on spinal cord - lumbar  . BREAST BIOPSY Right 01/31/1999   fibrocystic changes, ductal adenosis, focal atypical ductal epithelium.  Marland Kitchen CARDIAC CATHETERIZATION  03/08/2011   improved flow from the PCI  . CHOLECYSTECTOMY  03/25/1998   Dr. Lemar Livings, chronic cholecystitis.  . COLONOSCOPY WITH PROPOFOL N/A 09/28/2014   Procedure: COLONOSCOPY WITH PROPOFOL;  Surgeon: Earline Mayotte, MD;  Location: [facility] ENDOSCOPY;  Service: Endoscopy;  Laterality: N/A;  . CORONARY ANGIOPLASTY WITH STENT PLACEMENT  03/07/2011   inferior STEMI - RCA PROMUS 3.5 mm x 28 mm DES  (post Dil 3.7 distal to 4.2 prox)  . DENTAL SURGERY  2016  . DOPPLER ECHOCARDIOGRAPHY  03/27/2011   LV cavity is small,EF =>55%,MILD INFERIOR HYPOKINESIS; Mild Aortic Sclerosis  . ESOPHAGOGASTRODUODENOSCOPY N/A 09/28/2014   Procedure: ESOPHAGOGASTRODUODENOSCOPY (EGD);  Surgeon: Earline Mayotte, MD;  Location: [facility] ENDOSCOPY;  Service: Endoscopy;  Laterality: N/A;  . EYE SURGERY     blepheroplasty- bilateral  . JOINT REPLACEMENT    . KNEE SURGERY Left 2015  . KNEE SURGERY Right 2010   Baker cyst  . LEFT HEART CATHETERIZATION WITH CORONARY ANGIOGRAM N/A 03/07/2011  Procedure: LEFT HEART CATHETERIZATION WITH CORONARY ANGIOGRAM;  Surgeon: Marykay Lex, MD;  Location: [facility] CATH LAB;  Service: Cardiovascular;  Laterality: N/A;  . LEFT HEART CATHETERIZATION WITH CORONARY ANGIOGRAM N/A 03/08/2011   Procedure: LEFT HEART CATHETERIZATION WITH CORONARY ANGIOGRAM;  Surgeon: Marykay Lex, MD;  Location: [facility] CATH LAB;  Service: Cardiovascular;  Laterality: N/A;  . NM MYOCAR PERF WALL MOTION  04/20/2011   EF 81% (due to small LVcavity),fixed basal to mid inferior INFARCT -  NO ISCHEMIA; 10 METs  . skin nodule Left 09/11/1999   dermatofibroma left lower leg, positive lateral margin.  Marland Kitchen TOTAL KNEE ARTHROPLASTY Bilateral 07/20/2015   Procedure: TOTAL KNEE BILATERAL;  Surgeon: Loreta Ave, MD;  Location: [facility] OR;  Service: Orthopedics;  Laterality: Bilateral;  . UPPER GI ENDOSCOPY  02-22-03   Dr. Lemar Livings, normal    SOCIAL HISTORY:  Social History  Substance Use Topics  . Smoking status: Former Smoker    Packs/day: 0.25    Years: 40.00    Types: Cigarettes    Quit date: 03/07/2011  . Smokeless tobacco: Never Used  . Alcohol use No    FAMILY HISTORY:  Family History  Problem Relation Age of Onset  . Heart attack Mother   . Coronary artery disease Mother   . Stroke Mother   . Depression Mother   . Anxiety disorder Mother   . Alzheimer's disease Father   . Stroke Father   . Breast cancer Sister 49  . Depression Brother   . Drug abuse Brother   . Bipolar disorder Sister     DRUG ALLERGIES:  Allergies  Allergen Reactions  . Bee Venom Anaphylaxis  . Statins Other (See Comments)    Myalgias & fatigue  . Etodolac   . Prednisone Palpitations and Other (See Comments)    Can take small amounts. HR increase; increased energy   . Sulfa Antibiotics Other (See Comments)    Elevated B/P, skin turns red    REVIEW OF SYSTEMS:   CONSTITUTIONAL: No fever, fatigue or weakness.  EYES: No blurred or double vision.  EARS, NOSE, AND THROAT: No tinnitus or ear pain.  RESPIRATORY: No cough, shortness of breath, wheezing or hemoptysis.  CARDIOVASCULAR: Positive for chest pain, orthopnea, edema.  GASTROINTESTINAL: No nausea, vomiting, diarrhea or abdominal pain.  GENITOURINARY: No dysuria, hematuria.  ENDOCRINE: No polyuria, nocturia,  HEMATOLOGY: No anemia, easy bruising or bleeding SKIN: No rash or lesion. MUSCULOSKELETAL: No joint pain or arthritis.   NEUROLOGIC: No tingling, numbness, weakness.  PSYCHIATRY: No anxiety or depression.   MEDICATIONS  AT HOME:  Prior to Admission medications   Medication Sig Start Date End Date Taking? Authorizing Provider  ARIPiprazole (ABILIFY) 5 MG tablet Take 5 mg by mouth daily.   Yes [provider]  benazepril (LOTENSIN) 5 MG tablet Take 1 tablet (5 mg total) by mouth daily before breakfast. 09/14/15  Yes Marykay Lex, MD  buPROPion (WELLBUTRIN XL) 300 MG 24 hr tablet Take 1 tablet (300 mg total) by mouth every morning. 07/18/16 07/11/17 Yes Ravi, Himabindu, MD  clopidogrel (PLAVIX) 75 MG tablet Take 1 tablet (75 mg total) by mouth daily. 04/16/16  Yes Marykay Lex, MD  Evolocumab (REPATHA SURECLICK) 140 MG/ML SOAJ Inject 1 pen into the skin every 14 (fourteen) days. 06/06/16  Yes Marykay Lex, MD  FLUoxetine (PROZAC) 10 MG capsule Take 1 capsule (10 mg total) by mouth daily. 07/18/16 07/18/17 Yes Ravi, Himabindu, MD  FLUoxetine (PROZAC) 40 MG capsule Take  1 capsule (40 mg total) by mouth daily before breakfast. 07/18/16 07/11/17 Yes Ravi, Himabindu, MD  gabapentin (NEURONTIN) 300 MG capsule TAKE 2 CAPSULES EVERY NIGHT 06/25/16  Yes Malva LimesFisher, Donald E, MD  metoprolol (LOPRESSOR) 50 MG tablet Take 1 tablet (50 mg total) by mouth 2 (two) times daily. 09/14/15  Yes Marykay LexHarding, David W, MD  nitroGLYCERIN (NITROSTAT) 0.4 MG SL tablet Place 1 tablet (0.4 mg total) under the tongue every 5 (five) minutes as needed for chest pain (up to 3 tabs in 15 mins and then call 911). 07/25/16  Yes Marykay LexHarding, David W, MD  omeprazole (PRILOSEC OTC) 20 MG tablet Take 20 mg by mouth daily before breakfast.    Yes [provider]  traZODone (DESYREL) 50 MG tablet Take 1 tablet (50 mg total) by mouth at bedtime. Patient taking differently: Take 12.5 mg by mouth as needed.  07/18/16  Yes Ravi, Himabindu, MD  Venlafaxine HCl 225 MG TB24 Take 225 mg by mouth daily.   Yes [provider]      PHYSICAL EXAMINATION:   VITAL SIGNS: Blood pressure 128/67, pulse (!) 55, temperature 98.1 F (36.7 C), temperature source  Oral, resp. rate 15, height 5\' 7"  (1.702 m), weight 72.6 kg (160 lb), SpO2 95 %.  GENERAL:  61 y.o.-year-old patient lying in the bed with no acute distress.  EYES: Pupils equal, round, reactive to light and accommodation. No scleral icterus. Extraocular muscles intact.  HEENT: Head atraumatic, normocephalic. Oropharynx and nasopharynx clear.  NECK:  Supple, no jugular venous distention. No thyroid enlargement, no tenderness.  LUNGS: Normal breath sounds bilaterally, no wheezing, rales,rhonchi or crepitation. No use of accessory muscles of respiration.  CARDIOVASCULAR: S1, S2 normal. No murmurs, rubs, or gallops.  ABDOMEN: Soft, nontender, nondistended. Bowel sounds present. No organomegaly or mass.  EXTREMITIES: No pedal edema, cyanosis, or clubbing.  NEUROLOGIC: Cranial nerves II through XII are intact. Muscle strength 5/5 in all extremities. Sensation intact. Gait not checked.  PSYCHIATRIC: The patient is alert and oriented x 3.  SKIN: No obvious rash, lesion, or ulcer.   LABORATORY PANEL:   CBC  Recent Labs Lab 08/13/16 0844  WBC 5.8  HGB 12.7  HCT 37.6  PLT 259  MCV 89.6  MCH 30.4  MCHC 33.9  RDW 13.1   ------------------------------------------------------------------------------------------------------------------  Chemistries   Recent Labs Lab 08/13/16 0844  NA 137  K 4.2  CL 103  CO2 26  GLUCOSE 112*  BUN 13  CREATININE 1.05*  CALCIUM 9.0  AST 29  ALT 21  ALKPHOS 73  BILITOT 0.5   ------------------------------------------------------------------------------------------------------------------ estimated creatinine clearance is 54.7 mL/min (A) (by C-G formula based on SCr of 1.05 mg/dL (H)). ------------------------------------------------------------------------------------------------------------------ No results for input(s): TSH, T4TOTAL, T3FREE, THYROIDAB in the last 72 hours.  Invalid input(s): FREET3   Coagulation profile No results for  input(s): INR, PROTIME in the last 168 hours. ------------------------------------------------------------------------------------------------------------------- No results for input(s): DDIMER in the last 72 hours. -------------------------------------------------------------------------------------------------------------------  Cardiac Enzymes  Recent Labs Lab 08/13/16 0844 08/13/16 1106  TROPONINI <0.03 <0.03   ------------------------------------------------------------------------------------------------------------------ Invalid input(s): POCBNP  ---------------------------------------------------------------------------------------------------------------  Urinalysis    Component Value Date/Time   COLORURINE YELLOW 11/23/2011 1301   APPEARANCEUR CLEAR 11/23/2011 1301   LABSPEC 1.007 11/23/2011 1301   PHURINE 6.0 11/23/2011 1301   GLUCOSEU NEGATIVE 11/23/2011 1301   HGBUR NEGATIVE 11/23/2011 1301   BILIRUBINUR Small 04/15/2015 1404   KETONESUR NEGATIVE 11/23/2011 1301   PROTEINUR Trace 04/15/2015 1404   PROTEINUR NEGATIVE 11/23/2011 1301   UROBILINOGEN  0.2 04/15/2015 1404   UROBILINOGEN 0.2 11/23/2011 1301   NITRITE Negative 04/15/2015 1404   NITRITE NEGATIVE 11/23/2011 1301   LEUKOCYTESUR moderate (2+) (A) 04/15/2015 1404     RADIOLOGY: Dg Chest 2 View  Result Date: 08/13/2016 CLINICAL DATA:  Chest pain EXAM: CHEST  2 VIEW COMPARISON:  November 23, 2011 FINDINGS: There is slight scarring in the left base. Lungs elsewhere are clear. Heart size and pulmonary vascularity are normal. No adenopathy. No pneumothorax. No bone lesions. IMPRESSION: Slight scarring left base. No edema or consolidation. Stable cardiac silhouette. Electronically Signed   By: Bretta Bang III M.D.   On: 08/13/2016 09:06    EKG: Orders placed or performed during the hospital encounter of 08/13/16  . ED EKG within 10 minutes  . ED EKG within 10 minutes  . EKG 12-Lead  . EKG 12-Lead  .  EKG 12-Lead  . EKG 12-Lead   No significant changes compared to past.  IMPRESSION AND PLAN:  * Chest pain   Patient is a history of coronary artery disease, we'll continue home medications and admitted to telemetry floor with serial troponin monitoring.   Because of typical complaint I would call cardiology consult with her primary group and request a stress test for tomorrow.  * Hypertension   Continue home meds and monitor.  * Hyperlipidemia   Check lipid panel tomorrow.   All the records are reviewed and case discussed with ED provider. Management plans discussed with the patient, family and they are in agreement.  CODE STATUS: full code.  Code Status History    Date Active Date Inactive Code Status Order ID Comments User Context   03/08/2011  1:36 AM 03/08/2011  9:40 AM Full Code [id number]  Perkins, [place] Elizabeth, RN Inpatient     Patient's husband was present in the room during my visit.   TOTAL TIME TAKING CARE OF THIS PATIENT: 45 minutes.    Altamese Dilling M.D on 08/13/2016   Between 7am to 6pm - Pager - [phone]  After 6pm go to [email or web address] - password [email or web address]  [facility] [place] [facility]  Office  [phone]  CC: Primary care physician; Lorie Phenix, MD   Note: This dictation was prepared with Dragon dictation along with smaller phrase technology. Any transcriptional errors that result from this process are unintentional.

## 2016-08-13 NOTE — Consult Note (Signed)
CARDIOLOGY CONSULT NOTE  Patient ID: Jessica Landry MRN: [id number] DOB/AGE: 07-27-1955 61 y.o.  Admit date: 08/13/2016 Referring Physician:  Dr. Desiree Hane Primary Physician: Lorie Phenix, MD Primary Cardiologist : Dr. Herbie [place] Reason for Consultation : chest pain  Date:  08/13/2016    Chief Complaint  Patient presents with  . Chest Pain      History of Present Illness: Jessica Landry is a 61 y.o. female whom  I am seeing at the request of Dr. Elisabeth Pigeon for evaluation of chest pain with known history of coronary artery disease. The patient has known history of coronary artery disease with previous inferior ST elevation myocardial infarction in 2012 which was treated successfully with PCI and drug-eluting stent placement to the right coronary artery. Most recent stress test in 2013 showed evidence of small inferior infarct without significant ischemia with normal ejection fraction. The patient has known history of essential hypertension, GERD and hyperlipidemia with poor tolerance to statins. She was seen last month by Dr. Herbie Baltimore for atypical chest pain and syncope. She describes few recent episodes of chest pain mostly happening at rest and not with physical activities. The stress test today was described as substernal tightness and sharp pain radiating to her throat. It lasted for a few hours and did not respond to sublingual nitroglycerin. She denies heartburn. She takes omeprazole. The episodes of chest pain are mostly at rest and not with physical activities.    Past Medical History:  Diagnosis Date  . Anxiety   . Arthritis    knees & back   . CAD S/P percutaneous coronary angioplasty 03/07/2011   a) PCI of mRCA with Promus Element DES 3.5 mm x 28 mm (4.2 -- 3.7 mm); b) Myoview 04/2011: EF 81% (small LV Cavity), fixed basal-mid Inferior Infarct, No Ischemia, 10 METS  . Complication of anesthesia   . Depression 03/08/2011   Dr. Le[place]man[place], [facility]  . Essential hypertension  03/07/2011  . Family history of adverse reaction to anesthesia   . GERD (gastroesophageal reflux disease)   . Headache    chocolate & red wine triggers /w bad headaches   . History of back surgery   . Hyperlipidemia with target LDL less than 70 2012    Statin Intolerance (tried Lipitor & Crestor)  . Insomnia    without significant sleeping disorders  . Iron deficiency anemia 03/09/2011  . Neuropathy   . PONV (postoperative nausea and vomiting)   . ST-segment elevation myocardial infarction (STEMI) of inferior wall (HCC) 03/07/2011   RCA Occlusion --> PCI of mRCA; ECHO 03/2011: EF > 55%, MIld Inferior HK, Mild AoV Sclerosis.  . Vocal cord polyp 2005    Past Surgical History:  Procedure Laterality Date  . ABDOMINAL HYSTERECTOMY    . APPENDECTOMY  07/04/2000   Normal appendix, adhesions noted.  Marland Kitchen BACK SURGERY     cyst on spinal cord - lumbar  . BREAST BIOPSY Right 01/31/1999   fibrocystic changes, ductal adenosis, focal atypical ductal epithelium.  Marland Kitchen CARDIAC CATHETERIZATION  03/08/2011   improved flow from the PCI  . CHOLECYSTECTOMY  03/25/1998   Dr. Lemar Livings, chronic cholecystitis.  . COLONOSCOPY WITH PROPOFOL N/A 09/28/2014   Procedure: COLONOSCOPY WITH PROPOFOL;  Surgeon: Earline Mayotte, MD;  Location: [facility] ENDOSCOPY;  Service: Endoscopy;  Laterality: N/A;  . CORONARY ANGIOPLASTY WITH STENT PLACEMENT  03/07/2011   inferior STEMI - RCA PROMUS 3.5 mm x 28 mm DES  (post Dil 3.7 distal to 4.2 prox)  . DENTAL  SURGERY  2016  . DOPPLER ECHOCARDIOGRAPHY  03/27/2011   LV cavity is small,EF =>55%,MILD INFERIOR HYPOKINESIS; Mild Aortic Sclerosis  . ESOPHAGOGASTRODUODENOSCOPY N/A 09/28/2014   Procedure: ESOPHAGOGASTRODUODENOSCOPY (EGD);  Surgeon: Earline MayotteJeffrey W Byrnett, MD;  [facility] ENDOSCOPY;  Service: Endoscopy;  Laterality: N/A;  . EYE SURGERY     blepheroplasty- bilateral  . JOINT REPLACEMENT    . KNEE SURGERY Left 2015  . KNEE SURGERY Right 2010   Baker cyst  . LEFT HEART  CATHETERIZATION WITH CORONARY ANGIOGRAM N/A 03/07/2011   Procedure: LEFT HEART CATHETERIZATION WITH CORONARY ANGIOGRAM;  Surgeon: Marykay Lexavid W Harding, MD;  [facility];  Service: Cardiovascular;  Laterality: N/A;  . LEFT HEART CATHETERIZATION WITH CORONARY ANGIOGRAM N/A 03/08/2011   Procedure: LEFT HEART CATHETERIZATION WITH CORONARY ANGIOGRAM;  Surgeon: Marykay Lexavid W Harding, MD;  [facility];  Service: Cardiovascular;  Laterality: N/A;  . NM MYOCAR PERF WALL MOTION  04/20/2011   EF 81% (due to small LVcavity),fixed basal to mid inferior INFARCT - NO ISCHEMIA; 10 METs  . skin nodule Left 09/11/1999   dermatofibroma left lower leg, positive lateral margin.  Marland Kitchen. TOTAL KNEE ARTHROPLASTY Bilateral 07/20/2015   Procedure: TOTAL KNEE BILATERAL;  Surgeon: Loreta Aveaniel F Murphy, MD;  [facility];  Service: Orthopedics;  Laterality: Bilateral;  . UPPER GI ENDOSCOPY  02-22-03   Dr. Lemar LivingsByrnett, normal     Current Facility-Administered Medications  Medication Dose Route Frequency Provider Last Rate Last Dose  . ARIPiprazole (ABILIFY) tablet 5 mg  5 mg Oral Daily Altamese DillingVachhani, Vaibhavkumar, MD      . Melene Muller[START ON 08/14/2016] benazepril (LOTENSIN) tablet 5 mg  5 mg Oral QAC breakfast Altamese DillingVachhani, Vaibhavkumar, MD      . Melene Muller[START ON 08/14/2016] buPROPion (WELLBUTRIN XL) 24 hr tablet 300 mg  300 mg Oral Daily Altamese DillingVachhani, Vaibhavkumar, MD      . Melene Muller[START ON 08/14/2016] clopidogrel (PLAVIX) tablet 75 mg  75 mg Oral Daily Altamese DillingVachhani, Vaibhavkumar, MD      . docusate sodium (COLACE) capsule 100 mg  100 mg Oral BID PRN Altamese DillingVachhani, Vaibhavkumar, MD      . Melene Muller[START ON 08/14/2016] FLUoxetine (PROZAC) capsule 10 mg  10 mg Oral Daily Altamese DillingVachhani, Vaibhavkumar, MD      . Melene Muller[START ON 08/14/2016] FLUoxetine (PROZAC) capsule 40 mg  40 mg Oral Daily Altamese DillingVachhani, Vaibhavkumar, MD      . gabapentin (NEURONTIN) capsule 600 mg  600 mg Oral QHS Altamese DillingVachhani, Vaibhavkumar, MD      . heparin injection 5,000 Units  5,000 Units Subcutaneous Q8H Altamese DillingVachhani, Vaibhavkumar, MD       . metoprolol (LOPRESSOR) tablet 50 mg  50 mg Oral BID Altamese DillingVachhani, Vaibhavkumar, MD      . nitroGLYCERIN (NITROSTAT) SL tablet 0.4 mg  0.4 mg Sublingual Q5 min PRN Altamese DillingVachhani, Vaibhavkumar, MD      . Melene Muller[START ON 08/14/2016] pantoprazole (PROTONIX) EC tablet 40 mg  40 mg Oral Daily Altamese DillingVachhani, Vaibhavkumar, MD      . traZODone (DESYREL) tablet 50 mg  50 mg Oral QHS Altamese DillingVachhani, Vaibhavkumar, MD      . Melene Muller[START ON 08/14/2016] venlafaxine XR (EFFEXOR-XR) 24 hr capsule 225 mg  225 mg Oral Daily Altamese DillingVachhani, Vaibhavkumar, MD        Allergies:   Bee venom; Statins; Etodolac; Prednisone; and Sulfa antibiotics    Social History:  The patient  reports that she quit smoking about 5 years ago. Her smoking use included Cigarettes. She has a 10.00 pack-year smoking history. She has never used  smokeless tobacco. She reports that she does not drink alcohol or use drugs.   Family History:  The patient's family history includes Alzheimer's disease in her father; Anxiety disorder in her mother; Bipolar disorder in her sister; Breast cancer (age of onset: 28) in her sister; Coronary artery disease in her mother; Depression in her brother and mother; Drug abuse in her brother; Heart attack in her mother; Stroke in her father and mother.    ROS:  Please see the history of present illness.   Otherwise, review of systems are positive for none.   All other systems are reviewed and negative.    PHYSICAL EXAM: VS:  BP 131/76 (BP Location: Right Arm)   Pulse (!) 55   Temp 97.9 F (36.6 C)   Resp 16   Ht 5\' 7"  (1.702 m)   Wt 163 lb 8 oz (74.2 kg)   SpO2 98%   BMI 25.61 kg/m  , BMI Body mass index is 25.61 kg/m. GEN: Well nourished, well developed, in no acute distress  HEENT: normal  Neck: no JVD, carotid bruits, or masses Cardiac: RRR; no murmurs, rubs, or gallops,no edema  Respiratory:  clear to auscultation bilaterally, normal work of breathing GI: soft, nontender, nondistended, + BS MS: no deformity or atrophy  Skin:  warm and dry, no rash Neuro:  Strength and sensation are intact Psych: euthymic mood, full affect   EKG:   Personally reviewed by me and showed:  Normal sinus rhythm with low voltage and no significant ST or T wave changes. Telemetry recently reviewed by me and showed: Normal sinus rhythm.  Recent Labs: 09/29/2015: TSH 2.830 08/13/2016: ALT 21; BUN 13; Creatinine, Ser 1.05; Hemoglobin 12.7; Platelets 259; Potassium 4.2; Sodium 137    Lipid Panel    Component Value Date/Time   CHOL 281 (H) 05/15/2016 0910   CHOL 281 (H) 05/15/2016 0910   TRIG 94 05/15/2016 0910   TRIG 94 05/15/2016 0910   HDL 83 05/15/2016 0910   HDL 83 05/15/2016 0910   CHOLHDL 3.4 05/15/2016 0910   CHOLHDL 3.4 05/15/2016 0910   CHOLHDL 3.6 03/08/2011 0217   VLDL 23 03/08/2011 0217   LDLCALC 179 (H) 05/15/2016 0910   LDLCALC 179 (H) 05/15/2016 0910      Wt Readings from Last 3 Encounters:  08/13/16 163 lb 8 oz (74.2 kg)  07/24/16 165 lb (74.8 kg)  02/08/16 159 lb (72.1 kg)        ASSESSMENT AND PLAN:  1.  Atypical chest pain: The patient's symptoms are atypical and happening at rest and not with physical activities. In addition, the chest pain did not respond at all to nitroglycerin. In spite of prolonged chest pain at rest, her EKG does not show any acute changes and troponin remains negative. Thus, I doubt a cardiac etiology for her symptoms but given her recurrent symptoms and known history of coronary artery disease, I agree with a nuclear stress test to be done tomorrow. It is possible that some of her symptoms are GI in nature.   2. Essential hypertension: Blood pressure is controlled.  3. Hyperlipidemia with intolerance to statins. Continue Repatha.  4. Recent syncope: The patient is scheduled to have an outpatient carotid Doppler to evaluate vertebral arteries. Radial pulses are normal bilaterally and there is nothing to suggest subclavian steal syndrome.  Signed,  Lorine Bears, MD    08/13/2016 4:52 PM    [facility]

## 2016-08-14 ENCOUNTER — Encounter: Payer: Self-pay | Admitting: Radiology

## 2016-08-14 ENCOUNTER — Encounter: Payer: BLUE CROSS/BLUE SHIELD | Attending: Internal Medicine

## 2016-08-14 DIAGNOSIS — R079 Chest pain, unspecified: Secondary | ICD-10-CM | POA: Diagnosis present

## 2016-08-14 DIAGNOSIS — I10 Essential (primary) hypertension: Secondary | ICD-10-CM

## 2016-08-14 DIAGNOSIS — E785 Hyperlipidemia, unspecified: Secondary | ICD-10-CM

## 2016-08-14 LAB — LIPID PANEL
CHOL/HDL RATIO: 2.2 ratio
Cholesterol: 163 mg/dL (ref 0–200)
HDL: 75 mg/dL (ref >40)
LDL Cholesterol: 74 mg/dL (ref 0–99)
Triglycerides: 72 mg/dL (ref <150)
VLDL: 14 mg/dL (ref 0–40)

## 2016-08-14 LAB — HEMOGLOBIN A1C
Hgb A1c MFr Bld: 4.9 % (ref 4.8–5.6)
Mean Plasma Glucose: 94 mg/dL

## 2016-08-14 LAB — CBC
HCT: 36.4 % (ref 35.0–47.0)
Hemoglobin: 12.5 g/dL (ref 12.0–16.0)
MCH: 30.7 pg (ref 26.0–34.0)
MCHC: 34.3 g/dL (ref 32.0–36.0)
MCV: 89.6 fL (ref 80.0–100.0)
PLATELETS: 210 10*3/uL (ref 150–440)
RBC: 4.06 MIL/uL (ref 3.80–5.20)
RDW: 13.3 % (ref 11.5–14.5)
WBC: 4.8 10*3/uL (ref 3.6–11.0)

## 2016-08-14 LAB — NM MYOCAR MULTI W/SPECT W/WALL MOTION / EF
CSEPPHR: 75 {beats}/min
LVDIAVOL: 41 mL (ref 46–106)
LVSYSVOL: 13 mL
Rest HR: 56 {beats}/min
TID: 0.89

## 2016-08-14 LAB — BASIC METABOLIC PANEL
Anion gap: 5 (ref 5–15)
BUN: 14 mg/dL (ref 6–20)
CHLORIDE: 104 mmol/L (ref 101–111)
CO2: 31 mmol/L (ref 22–32)
CREATININE: 0.88 mg/dL (ref 0.44–1.00)
Calcium: 8.9 mg/dL (ref 8.9–10.3)
GFR calc Af Amer: >60 mL/min (ref >60)
GFR calc non Af Amer: >60 mL/min (ref >60)
GLUCOSE: 96 mg/dL (ref 65–99)
Potassium: 4.4 mmol/L (ref 3.5–5.1)
Sodium: 140 mmol/L (ref 135–145)

## 2016-08-14 LAB — HIV ANTIBODY (ROUTINE TESTING W REFLEX): HIV Screen 4th Generation wRfx: NONREACTIVE

## 2016-08-14 MED ORDER — ACETAMINOPHEN 325 MG PO TABS
650.0000 mg | ORAL_TABLET | Freq: Four times a day (QID) | ORAL | Status: DC | PRN
  Administered 2016-08-14: 650 mg via ORAL
  Filled 2016-08-14: qty 2

## 2016-08-14 MED ORDER — REGADENOSON 0.4 MG/5ML IV SOLN
0.4000 mg | Freq: Once | INTRAVENOUS | Status: AC
  Administered 2016-08-14: 0.4 mg via INTRAVENOUS

## 2016-08-14 MED ORDER — TECHNETIUM TC 99M TETROFOSMIN IV KIT
13.0000 | PACK | Freq: Once | INTRAVENOUS | Status: AC | PRN
  Administered 2016-08-14: 11.87 via INTRAVENOUS

## 2016-08-14 MED ORDER — TECHNETIUM TC 99M TETROFOSMIN IV KIT
30.0000 | PACK | Freq: Once | INTRAVENOUS | Status: AC | PRN
  Administered 2016-08-14: 32.41 via INTRAVENOUS

## 2016-08-14 MED ORDER — TRAZODONE HCL 50 MG PO TABS: 12.5000 mg | ORAL_TABLET | ORAL | Status: DC | PRN

## 2016-08-14 MED ORDER — GABAPENTIN 300 MG PO CAPS: 600.0000 mg | ORAL_CAPSULE | Freq: Every day | ORAL | 0 refills | Status: DC

## 2016-08-14 NOTE — Progress Notes (Signed)
Discharge paperwork reviewed with patient. Information regarding follow-up appointments, medications and education for all newly prescribed meds was provided, all questions answered. Peripheral IV removed, catheter intact. Heart monitor removed, returned to the nurse station. Transport requested via wheelchair to the lobby for discharge.    

## 2016-08-14 NOTE — Progress Notes (Signed)
Progress Note  Patient Name: Jessica Landry Date of Encounter: 08/14/2016  Primary Cardiologist: Herbie Baltimore  Subjective   Feels much better today. Minimal vague chest discomfort still present but nothing like what she experienced yesterday. No shortness of breath. Eager to go home today.  Inpatient Medications    Scheduled Meds: . ARIPiprazole  5 mg Oral Daily  . benazepril  5 mg Oral QAC breakfast  . buPROPion  300 mg Oral Daily  . clopidogrel  75 mg Oral Daily  . FLUoxetine  10 mg Oral Daily  . FLUoxetine  40 mg Oral Daily  . gabapentin  600 mg Oral QHS  . heparin  5,000 Units Subcutaneous Q8H  . metoprolol  50 mg Oral BID  . pantoprazole  40 mg Oral Daily  . traZODone  50 mg Oral QHS  . venlafaxine XR  225 mg Oral Daily   Continuous Infusions:  PRN Meds: acetaminophen, docusate sodium, nitroGLYCERIN   Vital Signs    Vitals:   08/13/16 1956 08/14/16 0546 08/14/16 0843 08/14/16 1258  BP: 112/65 (!) 102/54 (!) 102/58 122/63  Pulse: (!) 57 (!) 53 (!) 53 (!) 56  Resp: 16 15 17 16   Temp: 98.5 F (36.9 C) 98.3 F (36.8 C) 98 F (36.7 C) 97.9 F (36.6 C)  TempSrc: Oral Oral Oral Oral  SpO2: 99% 99% 100% 97%  Weight:      Height:        Intake/Output Summary (Last 24 hours) at 08/14/16 1356 Last data filed at 08/14/16 0557  Gross per 24 hour  Intake              120 ml  Output             2100 ml  Net            -1980 ml   Filed Weights   08/13/16 0845 08/13/16 1426  Weight: 160 lb (72.6 kg) 163 lb 8 oz (74.2 kg)    Telemetry    Sinus bradycardia and normal sinus rhythm with heart rate of 49-72 bpm - Personally Reviewed  ECG    Sinus bradycardia with low voltage - Personally Reviewed  Physical Exam   GEN: No acute distress.   Neck: No JVDOr HJR. Supple without lymphadenopathy. Cardiac: RRR, no murmurs, rubs, or gallops.  Respiratory: Clear to auscultation bilaterally. GI: Soft, nontender, non-distended  MS: No edema; 2+ radial and pedal pulses  bilaterally. No deformity. Neuro:  Nonfocal  Psych: Normal affect   Labs    Chemistry Recent Labs Lab 08/13/16 0844 08/14/16 0454  NA 137 140  K 4.2 4.4  CL 103 104  CO2 26 31  GLUCOSE 112* 96  BUN 13 14  CREATININE 1.05* 0.88  CALCIUM 9.0 8.9  PROT 6.7  --   ALBUMIN 3.9  --   AST 29  --   ALT 21  --   ALKPHOS 73  --   BILITOT 0.5  --   GFRNONAA 56* >60  GFRAA >60 >60  ANIONGAP 8 5     Hematology Recent Labs Lab 08/13/16 0844 08/14/16 0454  WBC 5.8 4.8  RBC 4.19 4.06  HGB 12.7 12.5  HCT 37.6 36.4  MCV 89.6 89.6  MCH 30.4 30.7  MCHC 33.9 34.3  RDW 13.1 13.3  PLT 259 210    Cardiac Enzymes Recent Labs Lab 08/13/16 0844 08/13/16 1106 08/13/16 1432 08/13/16 1823  TROPONINI <0.03 <0.03 <0.03 <0.03   No results for input(s): TROPIPOC  in the last 168 hours.   BNPNo results for input(s): BNP, PROBNP in the last 168 hours.   DDimer No results for input(s): DDIMER in the last 168 hours.   Radiology    Dg Chest 2 View  Result Date: 08/13/2016 CLINICAL DATA:  Chest pain EXAM: CHEST  2 VIEW COMPARISON:  November 23, 2011 FINDINGS: There is slight scarring in the left base. Lungs elsewhere are clear. Heart size and pulmonary vascularity are normal. No adenopathy. No pneumothorax. No bone lesions. IMPRESSION: Slight scarring left base. No edema or consolidation. Stable cardiac silhouette. Electronically Signed   By: Bretta BangWilliam  Woodruff III M.D.   On: 08/13/2016 09:06   Nm Myocar Multi W/spect W/wall Motion / Ef  Result Date: 08/14/2016  Low risk study without evidence of ischemia or infarction.  The left ventricular ejection fraction is hyperdynamic (>65%).     Cardiac Studies   Myocardial perfusion stress test (see above).  Patient Profile     61 y.o. female woman with history of CAD status post inferior STEMI, hypertension, hyperlipidemia, and iron deficiency anemia, admitted with atypical chest pain.  Assessment & Plan    Atypical chest pain Symptoms  much improved without intervention. Myocardial perfusion stress test normal without ischemia or scar. LVEF hyperdynamic. The low suspicion that her chest pain is due to epicardial coronary artery disease. Microvascular dysfunction and/or coronary vasospasm are considerations.  No further testing at this time.  If pain recurs, could consider addition of long-acting nitrate or ranolazine for an element of microvascular dysfunction and/or vasospasm.  Continue PPI for possible element of GERD.  Secondary prevention, including Repatha and clopidogrel.  Hypertension Blood pressure is normal today.  Continue current medications.  Hyperlipidemia Goal LDL less than 70. LDL was 74 this morning.  Continue Repatha; anticipate further LDL reduction with ongoing treatment.  Dispo: Follow-up with Dr. Herbie BaltimoreHarding as an outpatient.  Signed, Yvonne Kendallhristopher Shyonna Carlin, MD  08/14/2016, 1:56 PM

## 2016-08-14 NOTE — Discharge Summary (Signed)
[facility] - [place] at [facility]   PATIENT NAME: Jessica Landry    MR#:  [id number]  DATE OF BIRTH:  1956/04/05  DATE OF ADMISSION:  08/13/2016 ADMITTING PHYSICIAN: Altamese Dilling, MD  DATE OF DISCHARGE: 08/14/2016  PRIMARY CARE PHYSICIAN: Lorie Phenix, MD    ADMISSION DIAGNOSIS:  Chest pain [R07.9] Chest pain, unspecified type [R07.9]  DISCHARGE DIAGNOSIS:  Principal Problem:   Chest pain   SECONDARY DIAGNOSIS:   Past Medical History:  Diagnosis Date  . Anxiety   . Arthritis    knees & back   . CAD S/P percutaneous coronary angioplasty 03/07/2011   a) PCI of mRCA with Promus Element DES 3.5 mm x 28 mm (4.2 -- 3.7 mm); b) Myoview 04/2011: EF 81% (small LV Cavity), fixed basal-mid Inferior Infarct, No Ischemia, 10 METS  . Complication of anesthesia   . Depression 03/08/2011   Dr. Leory Plowman- [place], [facility]  . Essential hypertension 03/07/2011  . Family history of adverse reaction to anesthesia   . GERD (gastroesophageal reflux disease)   . Headache    chocolate & red wine triggers /w bad headaches   . History of back surgery   . Hyperlipidemia with target LDL less than 70 2012    Statin Intolerance (tried Lipitor & Crestor)  . Insomnia    without significant sleeping disorders  . Iron deficiency anemia 03/09/2011  . Neuropathy   . PONV (postoperative nausea and vomiting)   . ST-segment elevation myocardial infarction (STEMI) of inferior wall (HCC) 03/07/2011   RCA Occlusion --> PCI of mRCA; ECHO 03/2011: EF > 55%, MIld Inferior HK, Mild AoV Sclerosis.  . Vocal cord polyp 2005    HOSPITAL COURSE:   61 year old female with a history of coronary artery disease and depression who presents with atypical chest pain.  1. Atypical chest pain: Patient had normal troponins, EKG and telemetry monitoring. She underwent cardiac stress test was normal.  2. Essential hypertension: Blood pressure is well controlled. She will continue Lotensin,  metoprolol  3. Hyperlipidemia: She is intolerant to statins and therefore we'll continue Repatha  4. Major depression: Patient will continue Prozac 50 mg daily, Effexor trazodone and Abilify  5. CAD: Patient will continue Plavix, metoprolol, repatha. She can follow up with her cardiologist in a week.  DISCHARGE CONDITIONS AND DIET:   Stable for discharge on cardiac diet  CONSULTS OBTAINED:  Treatment Team:  Iran Ouch, MD  DRUG ALLERGIES:   Allergies  Allergen Reactions  . Bee Venom Anaphylaxis  . Statins Other (See Comments)    Myalgias & fatigue  . Etodolac   . Prednisone Palpitations and Other (See Comments)    Can take small amounts. HR increase; increased energy   . Sulfa Antibiotics Other (See Comments)    Elevated B/P, skin turns red    DISCHARGE MEDICATIONS:   Current Discharge Medication List    CONTINUE these medications which have CHANGED   Details  gabapentin (NEURONTIN) 300 MG capsule Take 2 capsules (600 mg total) by mouth at bedtime. Qty: 30 capsule, Refills: 0   Associated Diagnoses: Narrowing of intervertebral disc space    traZODone (DESYREL) 50 MG tablet Take 0.5 tablets (25 mg total) by mouth as needed.      CONTINUE these medications which have NOT CHANGED   Details  ARIPiprazole (ABILIFY) 5 MG tablet Take 5 mg by mouth daily.    benazepril (LOTENSIN) 5 MG tablet Take 1 tablet (5 mg total) by mouth daily before breakfast. Qty: 90  tablet, Refills: 2    buPROPion (WELLBUTRIN XL) 300 MG 24 hr tablet Take 1 tablet (300 mg total) by mouth every morning. Qty: 90 tablet, Refills: 0    clopidogrel (PLAVIX) 75 MG tablet Take 1 tablet (75 mg total) by mouth daily. Qty: 30 tablet, Refills: 1    Evolocumab (REPATHA SURECLICK) 140 MG/ML SOAJ Inject 1 pen into the skin every 14 (fourteen) days. Qty: 2 pen, Refills: 11    !! FLUoxetine (PROZAC) 10 MG capsule Take 1 capsule (10 mg total) by mouth daily. Qty: 30 capsule, Refills: 2    !!  FLUoxetine (PROZAC) 40 MG capsule Take 1 capsule (40 mg total) by mouth daily before breakfast. Qty: 90 capsule, Refills: 0    metoprolol (LOPRESSOR) 50 MG tablet Take 1 tablet (50 mg total) by mouth 2 (two) times daily. Qty: 180 tablet, Refills: 2    nitroGLYCERIN (NITROSTAT) 0.4 MG SL tablet Place 1 tablet (0.4 mg total) under the tongue every 5 (five) minutes as needed for chest pain (up to 3 tabs in 15 mins and then call 911). Qty: 25 tablet, Refills: 1    omeprazole (PRILOSEC OTC) 20 MG tablet Take 20 mg by mouth daily before breakfast.     Venlafaxine HCl 225 MG TB24 Take 225 mg by mouth daily.     !! - Potential duplicate medications found. Please discuss with provider.        Today   CHIEF COMPLAINT:   No chest pain.   VITAL SIGNS:  Blood pressure (!) 102/54, pulse (!) 53, temperature 98.3 F (36.8 C), temperature source Oral, resp. rate 15, height 5\' 7"  (1.702 m), weight 74.2 kg (163 lb 8 oz), SpO2 99 %.   REVIEW OF SYSTEMS:  Review of Systems  Constitutional: Negative.  Negative for chills, fever and malaise/fatigue.  HENT: Negative.  Negative for ear discharge, ear pain, hearing loss, nosebleeds and sore throat.   Eyes: Negative.  Negative for blurred vision and pain.  Respiratory: Negative.  Negative for cough, hemoptysis, shortness of breath and wheezing.   Cardiovascular: Negative.  Negative for chest pain, palpitations and leg swelling.  Gastrointestinal: Negative.  Negative for abdominal pain, blood in stool, diarrhea, nausea and vomiting.  Genitourinary: Negative.  Negative for dysuria.  Musculoskeletal: Negative.  Negative for back pain.  Skin: Negative.   Neurological: Negative for dizziness, tremors, speech change, focal weakness, seizures and headaches.  Endo/Heme/Allergies: Negative.  Does not bruise/bleed easily.  Psychiatric/Behavioral: Negative.  Negative for depression, hallucinations and suicidal ideas.     PHYSICAL EXAMINATION:  GENERAL:   61 y.o.-year-old patient lying in the bed with no acute distress.  NECK:  Supple, no jugular venous distention. No thyroid enlargement, no tenderness.  LUNGS: Normal breath sounds bilaterally, no wheezing, rales,rhonchi  No use of accessory muscles of respiration.  CARDIOVASCULAR: S1, S2 normal. No murmurs, rubs, or gallops.  ABDOMEN: Soft, non-tender, non-distended. Bowel sounds present. No organomegaly or mass.  EXTREMITIES: No pedal edema, cyanosis, or clubbing.  PSYCHIATRIC: The patient is alert and oriented x 3.  SKIN: No obvious rash, lesion, or ulcer.   DATA REVIEW:   CBC  Recent Labs Lab 08/14/16 0454  WBC 4.8  HGB 12.5  HCT 36.4  PLT 210    Chemistries   Recent Labs Lab 08/13/16 0844 08/14/16 0454  NA 137 140  K 4.2 4.4  CL 103 104  CO2 26 31  GLUCOSE 112* 96  BUN 13 14  CREATININE 1.05* 0.88  CALCIUM 9.0 8.9  AST 29  --   ALT 21  --   ALKPHOS 73  --   BILITOT 0.5  --     Cardiac Enzymes  Recent Labs Lab 08/13/16 1106 08/13/16 1432 08/13/16 1823  TROPONINI <0.03 <0.03 <0.03    Microbiology Results  @[email or web address] @  RADIOLOGY:  Dg Chest 2 View  Result Date: 08/13/2016 CLINICAL DATA:  Chest pain EXAM: CHEST  2 VIEW COMPARISON:  November 23, 2011 FINDINGS: There is slight scarring in the left base. Lungs elsewhere are clear. Heart size and pulmonary vascularity are normal. No adenopathy. No pneumothorax. No bone lesions. IMPRESSION: Slight scarring left base. No edema or consolidation. Stable cardiac silhouette. Electronically Signed   By: Bretta BangWilliam  Woodruff III M.D.   On: 08/13/2016 09:06      Current Discharge Medication List    CONTINUE these medications which have CHANGED   Details  gabapentin (NEURONTIN) 300 MG capsule Take 2 capsules (600 mg total) by mouth at bedtime. Qty: 30 capsule, Refills: 0   Associated Diagnoses: Narrowing of intervertebral disc space    traZODone (DESYREL) 50 MG tablet Take 0.5 tablets (25 mg total) by mouth as  needed.      CONTINUE these medications which have NOT CHANGED   Details  ARIPiprazole (ABILIFY) 5 MG tablet Take 5 mg by mouth daily.    benazepril (LOTENSIN) 5 MG tablet Take 1 tablet (5 mg total) by mouth daily before breakfast. Qty: 90 tablet, Refills: 2    buPROPion (WELLBUTRIN XL) 300 MG 24 hr tablet Take 1 tablet (300 mg total) by mouth every morning. Qty: 90 tablet, Refills: 0    clopidogrel (PLAVIX) 75 MG tablet Take 1 tablet (75 mg total) by mouth daily. Qty: 30 tablet, Refills: 1    Evolocumab (REPATHA SURECLICK) 140 MG/ML SOAJ Inject 1 pen into the skin every 14 (fourteen) days. Qty: 2 pen, Refills: 11    !! FLUoxetine (PROZAC) 10 MG capsule Take 1 capsule (10 mg total) by mouth daily. Qty: 30 capsule, Refills: 2    !! FLUoxetine (PROZAC) 40 MG capsule Take 1 capsule (40 mg total) by mouth daily before breakfast. Qty: 90 capsule, Refills: 0    metoprolol (LOPRESSOR) 50 MG tablet Take 1 tablet (50 mg total) by mouth 2 (two) times daily. Qty: 180 tablet, Refills: 2    nitroGLYCERIN (NITROSTAT) 0.4 MG SL tablet Place 1 tablet (0.4 mg total) under the tongue every 5 (five) minutes as needed for chest pain (up to 3 tabs in 15 mins and then call 911). Qty: 25 tablet, Refills: 1    omeprazole (PRILOSEC OTC) 20 MG tablet Take 20 mg by mouth daily before breakfast.     Venlafaxine HCl 225 MG TB24 Take 225 mg by mouth daily.     !! - Potential duplicate medications found. Please discuss with provider.        Management plans discussed with the patient and she is in agreement. Stable for discharge home  Patient should follow up with pcp  CODE STATUS:     Code Status Orders        Start     Ordered   08/13/16 1433  Full code  Continuous     08/13/16 1433    Code Status History    Date Active Date Inactive Code Status Order ID Comments User Context   03/08/2011  1:36 AM 03/08/2011  9:40 AM Full Code [id number]  Perkins, [place] Elizabeth, RN Inpatient       TOTAL TIME TAKING  CARE OF THIS PATIENT: 37 minutes.    Note: This dictation was prepared with Dragon dictation along with smaller phrase technology. Any transcriptional errors that result from this process are unintentional.  Edrei Norgaard M.D on 08/14/2016 at 8:16 AM  Between 7am to 6pm - Pager - [phone] After 6pm go to [email or web address] - Social research officer, government  [facility]  Office  [phone]  CC: Primary care physician; Lorie Phenix, MD

## 2016-08-21 ENCOUNTER — Ambulatory Visit (HOSPITAL_COMMUNITY)
Admission: RE | Admit: 2016-08-21 | Discharge: 2016-08-21 | Disposition: A | Discharge status: Home / Self Care | Payer: BLUE CROSS/BLUE SHIELD | Source: Ambulatory Visit | Attending: Cardiology | Admitting: Cardiology

## 2016-08-21 DIAGNOSIS — R51 Headache: Secondary | ICD-10-CM | POA: Diagnosis not present

## 2016-08-21 DIAGNOSIS — R42 Dizziness and giddiness: Secondary | ICD-10-CM | POA: Diagnosis not present

## 2016-08-21 DIAGNOSIS — I2510 Atherosclerotic heart disease of native coronary artery without angina pectoris: Secondary | ICD-10-CM | POA: Diagnosis not present

## 2016-08-21 DIAGNOSIS — I10 Essential (primary) hypertension: Secondary | ICD-10-CM | POA: Insufficient documentation

## 2016-08-21 DIAGNOSIS — I6523 Occlusion and stenosis of bilateral carotid arteries: Secondary | ICD-10-CM | POA: Diagnosis not present

## 2016-08-21 DIAGNOSIS — E785 Hyperlipidemia, unspecified: Secondary | ICD-10-CM | POA: Diagnosis not present

## 2016-08-21 DIAGNOSIS — R55 Syncope and collapse: Secondary | ICD-10-CM | POA: Diagnosis present

## 2016-08-21 DIAGNOSIS — Z87891 Personal history of nicotine dependence: Secondary | ICD-10-CM | POA: Diagnosis not present

## 2016-08-28 ENCOUNTER — Other Ambulatory Visit: Payer: Self-pay | Admitting: *Deleted

## 2016-08-28 MED ORDER — CLOPIDOGREL BISULFATE 75 MG PO TABS: 75.0000 mg | ORAL_TABLET | Freq: Every day | ORAL | 6 refills | Status: DC

## 2016-08-28 NOTE — Telephone Encounter (Signed)
Rx request sent to pharmacy.  

## 2016-08-29 ENCOUNTER — Other Ambulatory Visit: Payer: Self-pay

## 2016-08-29 MED ORDER — METOPROLOL TARTRATE 50 MG PO TABS: 50.0000 mg | ORAL_TABLET | Freq: Two times a day (BID) | ORAL | 3 refills | Status: DC

## 2016-08-31 ENCOUNTER — Telehealth: Payer: Self-pay | Admitting: Cardiology

## 2016-08-31 MED ORDER — METOPROLOL TARTRATE 50 MG PO TABS: 50.0000 mg | ORAL_TABLET | Freq: Two times a day (BID) | ORAL | 3 refills | Status: DC

## 2016-08-31 MED ORDER — CLOPIDOGREL BISULFATE 75 MG PO TABS: 75.0000 mg | ORAL_TABLET | Freq: Every day | ORAL | 3 refills | Status: DC

## 2016-08-31 NOTE — Telephone Encounter (Signed)
Called mail order pharmacy. They had Rx forwarded to them from [facility] mail order specialty pharmacy. Authorized refilled to this paharmacy and removed specialty pharmacy location from chart.

## 2016-08-31 NOTE — Telephone Encounter (Signed)
New message    Ellard ArtisMonet is calling about clarification. She states they received a fax from another pharmacy and they need approval to fill medication for pt.

## 2016-08-31 NOTE — Telephone Encounter (Signed)
°*  STAT* If patient is at the pharmacy, call can be transferred to refill team.   1. Which medications need to be refilled? (please list name of each medication and dose if known) Clopdogrel 75 mg  2. Which pharmacy/location (including street and city if local pharmacy) is medication to be sent to?[facility] [phone]  3. Do they need a 30 day or 90 day supply? 90 and refiils

## 2016-08-31 NOTE — Telephone Encounter (Signed)
Refill sent to the pharmacy electronically.  

## 2016-09-09 ENCOUNTER — Encounter: Payer: Self-pay | Admitting: Cardiology

## 2016-09-10 ENCOUNTER — Telehealth: Payer: Self-pay | Admitting: Cardiology

## 2016-09-10 ENCOUNTER — Other Ambulatory Visit: Payer: Self-pay | Admitting: *Deleted

## 2016-09-10 MED ORDER — BENAZEPRIL HCL 5 MG PO TABS: 5.0000 mg | ORAL_TABLET | Freq: Every day | ORAL | 2 refills | Status: DC

## 2016-09-10 MED ORDER — CLOPIDOGREL BISULFATE 75 MG PO TABS: 75.0000 mg | ORAL_TABLET | Freq: Every day | ORAL | 3 refills | Status: DC

## 2016-09-10 NOTE — Telephone Encounter (Addendum)
New message        *STAT* If patient is at the pharmacy, call can be transferred to refill team.   1. Which medications need to be refilled? (please list name of each medication and dose if known)  Clopidogrel and bennzepril 2. Which pharmacy/location (including street and city if local pharmacy) is medication to be sent to? [facility] at [address] in [place]  3. Do they need a 30 day or 90 day supply?  Pt is out of medication-----need enough to last until pt gets mail order presc

## 2016-09-10 NOTE — Telephone Encounter (Signed)
Refill sent to the pharmacy electronically.  

## 2016-09-13 ENCOUNTER — Encounter: Payer: Self-pay | Admitting: Physician Assistant

## 2016-09-13 ENCOUNTER — Ambulatory Visit (INDEPENDENT_AMBULATORY_CARE_PROVIDER_SITE_OTHER): Payer: BLUE CROSS/BLUE SHIELD | Admitting: Physician Assistant

## 2016-09-13 VITALS — BP 112/68 | HR 60 | Temp 98.6°F | Resp 16 | Wt 167.0 lb

## 2016-09-13 DIAGNOSIS — I10 Essential (primary) hypertension: Secondary | ICD-10-CM

## 2016-09-13 DIAGNOSIS — Z9861 Coronary angioplasty status: Secondary | ICD-10-CM

## 2016-09-13 DIAGNOSIS — E785 Hyperlipidemia, unspecified: Secondary | ICD-10-CM | POA: Diagnosis not present

## 2016-09-13 DIAGNOSIS — K219 Gastro-esophageal reflux disease without esophagitis: Secondary | ICD-10-CM | POA: Diagnosis not present

## 2016-09-13 DIAGNOSIS — Z09 Encounter for follow-up examination after completed treatment for conditions other than malignant neoplasm: Secondary | ICD-10-CM

## 2016-09-13 DIAGNOSIS — I2510 Atherosclerotic heart disease of native coronary artery without angina pectoris: Secondary | ICD-10-CM | POA: Diagnosis not present

## 2016-09-13 DIAGNOSIS — I2119 ST elevation (STEMI) myocardial infarction involving other coronary artery of inferior wall: Secondary | ICD-10-CM

## 2016-09-13 MED ORDER — OMEPRAZOLE 20 MG PO CPDR: 20.0000 mg | DELAYED_RELEASE_CAPSULE | Freq: Every day | ORAL | 0 refills | Status: DC

## 2016-09-13 NOTE — Patient Instructions (Signed)
Chest Wall Pain °Chest wall pain is pain in or around the bones and muscles of your chest. Sometimes, an injury causes this pain. Sometimes, the cause may not be known. This pain may take several weeks or longer to get better. °Follow these instructions at home: °Pay attention to any changes in your symptoms. Take these actions to help with your pain: °· Rest as told by your doctor. °· Avoid activities that cause pain. Try not to use your chest, belly (abdominal), or side muscles to lift heavy things. °· If directed, apply ice to the painful area: °? Put ice in a plastic bag. °? Place a towel between your skin and the bag. °? Leave the ice on for 20 minutes, 2-3 times per day. °· Take over-the-counter and prescription medicines only as told by your doctor. °· Do not use tobacco products, including cigarettes, chewing tobacco, and e-cigarettes. If you need help quitting, ask your doctor. °· Keep all follow-up visits as told by your doctor. This is important. ° °Contact a doctor if: °· You have a fever. °· Your chest pain gets worse. °· You have new symptoms. °Get help right away if: °· You feel sick to your stomach (nauseous) or you throw up (vomit). °· You feel sweaty or light-headed. °· You have a cough with phlegm (sputum) or you cough up blood. °· You are short of breath. °This information is not intended to replace advice given to you by your health care provider. Make sure you discuss any questions you have with your health care provider. °Document Released: 09/12/2007 Document Revised: 09/01/2015 Document Reviewed: 06/21/2014 °Elsevier Interactive Patient Education © 2018 [employer] ° °

## 2016-09-13 NOTE — Progress Notes (Signed)
Patient: Jessica Landry Female    DOB: 1956/01/24   61 y.o.   MRN: [id number] Visit Date: 09/13/2016  Today's Provider: Trey Sailors, PA-C   Chief Complaint  Patient presents with  . Hospitalization Follow-up  . Chest Pain   Subjective:      Jessica Landry is a 61 y/o former smoker with a history of STEMI in 2012 s/p PCI and stent of the RCA, on Clopidogrel, benazepril, and Metoprolol, presenting today for hospital follow up of atypical chest pain. She was admitted for observation to [facility] on 08/13/2016 and discharged on 08/14/2016 for atypical chest pain. Her troponins were normal, as was her CBC, CMET, and CXR. She had a Myoview study revealing low risk without ischemia or infarction and 65% EF. None of her medications were changed. Today she reports feeling better and suspecting her symptoms were from heartburn. No chest pain, dyspnea, SOB, nausea, vomiting. She has been taking omeprazole for this. She has been on omeprazole for years, ever since her cholecystectomy. She reports her diet can be poor. She reports her cardiologist is aware of this. She is also currently on Repatha for lipid management.  Chest Pain   This is a new problem. The current episode started more than 1 month ago. The onset quality is sudden. The problem has been resolved. Pertinent negatives include no abdominal pain, cough, dizziness, headaches or palpitations.  Gastroesophageal Reflux  She reports no abdominal pain, no chest pain, no coughing or no sore throat. She has tried a PPI for the symptoms.  Otalgia   There is pain in the right ear. This is a recurrent problem. The problem has been waxing and waning. There has been no fever. Pertinent negatives include no abdominal pain, coughing, diarrhea, ear discharge, headaches, hearing loss, rash, rhinorrhea or sore throat.    Follow up Hospitalization  Patient was admitted to [facility] on 08/13/2016 and discharged on 08/14/2016. She was treated for Atypical  chest pain/ heartburn. Treatment for this included cardiac  Monitoring, BP medications. She reports excellent compliance with treatment. She reports this condition is Improved.  ------------------------------------------------------------------------------------       Allergies  Allergen Reactions  . Bee Venom Anaphylaxis  . Statins Other (See Comments)    Myalgias & fatigue  . Etodolac   . Prednisone Palpitations and Other (See Comments)    Can take small amounts. HR increase; increased energy   . Sulfa Antibiotics Other (See Comments)    Elevated B/P, skin turns red     Current Outpatient Prescriptions:  .  ARIPiprazole (ABILIFY) 5 MG tablet, Take 5 mg by mouth daily., Disp: , Rfl:  .  benazepril (LOTENSIN) 5 MG tablet, Take 1 tablet (5 mg total) by mouth daily before breakfast., Disp: 30 tablet, Rfl: 2 .  buPROPion (WELLBUTRIN XL) 300 MG 24 hr tablet, Take 1 tablet (300 mg total) by mouth every morning., Disp: 90 tablet, Rfl: 0 .  clopidogrel (PLAVIX) 75 MG tablet, Take 1 tablet (75 mg total) by mouth daily., Disp: 30 tablet, Rfl: 3 .  Evolocumab (REPATHA SURECLICK) 140 MG/ML SOAJ, Inject 1 pen into the skin every 14 (fourteen) days., Disp: 2 pen, Rfl: 11 .  FLUoxetine (PROZAC) 10 MG capsule, Take 1 capsule (10 mg total) by mouth daily., Disp: 30 capsule, Rfl: 2 .  FLUoxetine (PROZAC) 40 MG capsule, Take 1 capsule (40 mg total) by mouth daily before breakfast., Disp: 90 capsule, Rfl: 0 .  gabapentin (NEURONTIN) 300 MG capsule,  Take 2 capsules (600 mg total) by mouth at bedtime., Disp: 30 capsule, Rfl: 0 .  metoprolol tartrate (LOPRESSOR) 50 MG tablet, Take 1 tablet (50 mg total) by mouth 2 (two) times daily., Disp: 180 tablet, Rfl: 3 .  nitroGLYCERIN (NITROSTAT) 0.4 MG SL tablet, Place 1 tablet (0.4 mg total) under the tongue every 5 (five) minutes as needed for chest pain (up to 3 tabs in 15 mins and then call 911)., Disp: 25 tablet, Rfl: 1 .  omeprazole (PRILOSEC OTC) 20 MG  tablet, Take 20 mg by mouth daily before breakfast. , Disp: , Rfl:  .  traZODone (DESYREL) 50 MG tablet, Take 0.5 tablets (25 mg total) by mouth as needed., Disp: , Rfl:  .  Venlafaxine HCl 225 MG TB24, Take 225 mg by mouth daily., Disp: , Rfl:   Review of Systems  Constitutional: Negative.   HENT: Positive for ear pain (Right ear pain; sharpe pain the comes and goes. ). Negative for congestion, ear discharge, hearing loss, nosebleeds, postnasal drip, rhinorrhea, sinus pain, sinus pressure, sneezing, sore throat, tinnitus, trouble swallowing and voice change.   Respiratory: Negative.  Negative for cough.   Cardiovascular: Negative for chest pain, palpitations and leg swelling.  Gastrointestinal: Negative.  Negative for abdominal pain and diarrhea.  Musculoskeletal: Negative.   Skin: Negative for rash.  Neurological: Negative for dizziness, light-headedness and headaches.    Social History  Substance Use Topics  . Smoking status: Former Smoker    Packs/day: 0.25    Years: 40.00    Types: Cigarettes    Quit date: 03/07/2011  . Smokeless tobacco: Never Used  . Alcohol use No   Objective:   BP 112/68 (BP Location: Left Arm, Patient Position: Sitting, Cuff Size: Normal)   Pulse 60   Temp 98.6 F (37 C) (Oral)   Resp 16   Wt 167 lb (75.8 kg)   BMI 26.16 kg/m  Vitals:   09/13/16 1012  BP: 112/68  Pulse: 60  Resp: 16  Temp: 98.6 F (37 C)  TempSrc: Oral  Weight: 167 lb (75.8 kg)     Physical Exam  Constitutional: She is oriented to person, place, and time. She appears well-developed and well-nourished.  Cardiovascular: Normal rate and regular rhythm.   Pulmonary/Chest: Effort normal and breath sounds normal.  Abdominal: Soft. Bowel sounds are normal.  Neurological: She is alert and oriented to person, place, and time.  Skin: Skin is warm and dry.  Psychiatric: She has a normal mood and affect. Her behavior is normal.        Assessment & Plan:     1. Hospital  discharge follow-up  Recently admitted for observation of atypical chest pain with normal workup. Feeling better at discharge. I have reviewed her labwork on 08/13/2016 and 08/14/2016 from this hospitalization, as well has personally reviewed the 08/13/2016 CXR and also Myoview. I have reconciled her discharge medications with her current medications. Encouraged her to maintain f/u with cardiology.  2. ST elevation myocardial infarction (STEMI) of inferior wall, subsequent episode of care [facility])  On Plavix, beta blocker, ACEi and Repatha. Lipids at goal.  3. CAD S/P PCI mRCA: Promus Element DES 3.5 mm x 28 mm (4.2-37 mm))  See above.  4. Benign essential HTN  Controlled.  5. Hyperlipidemia with target LDL less than 70; Statin Intolerant  Controlled.   6. Gastroesophageal reflux disease, esophagitis presence not specified  Gave script for affordability. This medication does have interaction with Plavix that could make it  less effective, though she is six years out from stent placement. Have reviewed cardiology notes and have seen this on her medication list at these visits. Have counseled patient about this interaction and avoiding trigger foods. If she needs more coverage, we can consider switching to Dexilant.   - omeprazole (PRILOSEC) 20 MG capsule; Take 1 capsule (20 mg total) by mouth daily.  Dispense: 90 capsule; Refill: 0  I have spent 25 minutes with this patient, >50% of which was spent on counseling and coordination of care.       Trey SailorsAdriana M Pollak, PA-C  [facility]

## 2016-10-08 ENCOUNTER — Ambulatory Visit (INDEPENDENT_AMBULATORY_CARE_PROVIDER_SITE_OTHER): Payer: BLUE CROSS/BLUE SHIELD | Admitting: Physician Assistant

## 2016-10-08 ENCOUNTER — Encounter: Payer: Self-pay | Admitting: Physician Assistant

## 2016-10-08 VITALS — BP 112/68 | HR 60 | Temp 98.1°F | Resp 16 | Wt 168.0 lb

## 2016-10-08 DIAGNOSIS — H9201 Otalgia, right ear: Secondary | ICD-10-CM | POA: Diagnosis not present

## 2016-10-08 NOTE — Progress Notes (Signed)
Patient: Jessica Landry Female    DOB: 1955-06-29   61 y.o.   MRN: [id number] Visit Date: 10/08/2016  Today's Provider: Trey Sailors, PA-C   Chief Complaint  Patient presents with  . Ear Pain    Right ear; comes and goes.  Has been going to for awhile.   Subjective:      Jessica Landry is a 61 y/o woman with history of STEMI presenting today for right ear pain occurring for three years. The ear pain occurs intermittently. At first, it might have been just two-three times per year. Then it increased to once per month. Most recently, it has been occurring once every couple of weeks. She does not recall any injury. Does not insert objects into her ear such as headphones or ear plugs. Does not swim or deep sea dive. No fever, chills, discharge, imbalance. She says the pain is sudden, sharp, and stabbing. Can come and go quickly. Not really relieved by much. Pain was so bad last night she took leftover narcotic and went to sleep. She has history of dental implants. She thinks she might grind her teeth in her sleep. It is mostly the right ear but also the left sometimes.  Otalgia   There is pain in the right ear. This is a recurrent (Has been going on for several years; but seems to be getting worse. ) problem. The problem has been waxing and waning. There has been no fever. Pertinent negatives include no abdominal pain, coughing, diarrhea, ear discharge, headaches, hearing loss, neck pain, rash, rhinorrhea, sore throat or vomiting.       Allergies  Allergen Reactions  . Bee Venom Anaphylaxis  . Statins Other (See Comments)    Myalgias & fatigue  . Etodolac   . Prednisone Palpitations and Other (See Comments)    Can take small amounts. HR increase; increased energy   . Sulfa Antibiotics Other (See Comments)    Elevated B/P, skin turns red     Current Outpatient Prescriptions:  .  ARIPiprazole (ABILIFY) 5 MG tablet, Take 5 mg by mouth daily., Disp: , Rfl:  .  benazepril  (LOTENSIN) 5 MG tablet, Take 1 tablet (5 mg total) by mouth daily before breakfast., Disp: 30 tablet, Rfl: 2 .  buPROPion (WELLBUTRIN XL) 300 MG 24 hr tablet, Take 1 tablet (300 mg total) by mouth every morning., Disp: 90 tablet, Rfl: 0 .  clopidogrel (PLAVIX) 75 MG tablet, Take 1 tablet (75 mg total) by mouth daily., Disp: 30 tablet, Rfl: 3 .  Evolocumab (REPATHA SURECLICK) 140 MG/ML SOAJ, Inject 1 pen into the skin every 14 (fourteen) days., Disp: 2 pen, Rfl: 11 .  FLUoxetine (PROZAC) 10 MG capsule, Take 1 capsule (10 mg total) by mouth daily., Disp: 30 capsule, Rfl: 2 .  FLUoxetine (PROZAC) 40 MG capsule, Take 1 capsule (40 mg total) by mouth daily before breakfast., Disp: 90 capsule, Rfl: 0 .  gabapentin (NEURONTIN) 300 MG capsule, Take 2 capsules (600 mg total) by mouth at bedtime., Disp: 30 capsule, Rfl: 0 .  metoprolol tartrate (LOPRESSOR) 50 MG tablet, Take 1 tablet (50 mg total) by mouth 2 (two) times daily., Disp: 180 tablet, Rfl: 3 .  nitroGLYCERIN (NITROSTAT) 0.4 MG SL tablet, Place 1 tablet (0.4 mg total) under the tongue every 5 (five) minutes as needed for chest pain (up to 3 tabs in 15 mins and then call 911)., Disp: 25 tablet, Rfl: 1 .  omeprazole (PRILOSEC OTC) 20  MG tablet, Take 20 mg by mouth daily before breakfast. , Disp: , Rfl:  .  omeprazole (PRILOSEC) 20 MG capsule, Take 1 capsule (20 mg total) by mouth daily., Disp: 90 capsule, Rfl: 0 .  traZODone (DESYREL) 50 MG tablet, Take 0.5 tablets (25 mg total) by mouth as needed., Disp: , Rfl:  .  Venlafaxine HCl 225 MG TB24, Take 225 mg by mouth daily., Disp: , Rfl:   Review of Systems  Constitutional: Negative.   HENT: Positive for ear pain. Negative for congestion, ear discharge, facial swelling, hearing loss, mouth sores, nosebleeds, postnasal drip, rhinorrhea, sinus pain, sinus pressure, sneezing, sore throat, tinnitus, trouble swallowing and voice change.   Eyes: Negative.   Respiratory: Negative for cough.     Gastrointestinal: Negative for abdominal pain, diarrhea and vomiting.  Musculoskeletal: Negative for neck pain.  Skin: Negative for rash.  Neurological: Negative for dizziness, light-headedness and headaches.    Social History  Substance Use Topics  . Smoking status: Former Smoker    Packs/day: 0.25    Years: 40.00    Types: Cigarettes    Quit date: 03/07/2011  . Smokeless tobacco: Never Used  . Alcohol use No   Objective:   BP 112/68 (BP Location: Left Arm, Patient Position: Sitting, Cuff Size: Normal)   Pulse 60   Temp 98.1 F (36.7 C) (Oral)   Resp 16   Wt 168 lb (76.2 kg)   BMI 26.31 kg/m  Vitals:   10/08/16 1615  BP: 112/68  Pulse: 60  Resp: 16  Temp: 98.1 F (36.7 C)  TempSrc: Oral  Weight: 168 lb (76.2 kg)     Physical Exam  Constitutional: She appears well-developed and well-nourished.  HENT:  Right Ear: Tympanic membrane, external ear and ear canal normal. No drainage, swelling or tenderness. No foreign bodies. No mastoid tenderness. Tympanic membrane is not perforated and not erythematous. No middle ear effusion.  Left Ear: Tympanic membrane, external ear and ear canal normal. No drainage, swelling or tenderness. No foreign bodies. No mastoid tenderness. Tympanic membrane is not perforated and not erythematous.  No middle ear effusion.  Mouth/Throat: Oropharynx is clear and moist. No oropharyngeal exudate.  Some crepitus in TMJ bilaterally when opening and closing jaw.  Neck: Neck supple.  Lymphadenopathy:    She has no cervical adenopathy.        Assessment & Plan:     1. Right ear pain  Doesn't appear to be infectious source or foreign body. Suspect patient may have some TMJ dysfunction. Suggested she get mouth guard to use at night over the counter for teeth grinding. Usually use NSAIDs but patient has history of STEMI and is on Plavix, so hesitant. Patient mostly wanted reassurance that nothing serious was wrong until she gets to her ENT  appointment at the end of this month. Please call if anything changes.  Return if symptoms worsen or fail to improve.  The entirety of the information documented in the History of Present Illness, Review of Systems and Physical Exam were personally obtained by me. Portions of this information were initially documented by Kavin LeechLaura Walsh, CMA and reviewed by me for thoroughness and accuracy.        Trey SailorsAdriana M Pollak, PA-C  [facility]

## 2016-10-08 NOTE — Patient Instructions (Signed)

## 2016-10-17 ENCOUNTER — Encounter: Payer: Self-pay | Admitting: Psychiatry

## 2016-10-17 ENCOUNTER — Ambulatory Visit (INDEPENDENT_AMBULATORY_CARE_PROVIDER_SITE_OTHER): Payer: BLUE CROSS/BLUE SHIELD | Admitting: Psychiatry

## 2016-10-17 VITALS — BP 128/72 | HR 60 | Temp 98.8°F | Wt 168.4 lb

## 2016-10-17 DIAGNOSIS — F331 Major depressive disorder, recurrent, moderate: Secondary | ICD-10-CM | POA: Diagnosis not present

## 2016-10-17 MED ORDER — BUPROPION HCL ER (XL) 300 MG PO TB24: 300.0000 mg | ORAL_TABLET | ORAL | 0 refills | Status: DC

## 2016-10-17 MED ORDER — FLUOXETINE HCL 40 MG PO CAPS: 40.0000 mg | ORAL_CAPSULE | Freq: Every day | ORAL | 0 refills | Status: DC

## 2016-10-17 MED ORDER — FLUOXETINE HCL 10 MG PO CAPS: 10.0000 mg | ORAL_CAPSULE | Freq: Every day | ORAL | 1 refills | Status: DC

## 2016-10-17 MED ORDER — TRAZODONE HCL 50 MG PO TABS: 50.0000 mg | ORAL_TABLET | ORAL | 1 refills | Status: DC | PRN

## 2016-10-17 NOTE — Progress Notes (Signed)
Patient ID: Jessica Landry, female   DOB: 25-Nov-1955, 61 y.o.   MRN: [id number]   [facility] MD/PA/NP OP Progress Note  10/17/2016 11:06 AM Jessica Landry  MRN:  [id number]  Subjective:  Patient returns for follow-up of her major depressive disorder, recurrent moderate.    Reports doing ok overall. Compliant with all her medications. Enjoying working in her yard. Looking for  A therapist. States she is getting along better with her husband, they are talking a lot. Denies any suicidal thoughts.   Chief Complaint: Doing well Chief Complaint    Follow-up; Medication Refill     Visit Diagnosis:     ICD-10-CM   1. Major depressive disorder, recurrent episode, moderate (HCC) F33.1     Past Medical History:  Past Medical History:  Diagnosis Date  . Anxiety   . Arthritis    knees & back   . CAD S/P percutaneous coronary angioplasty 03/07/2011   a) PCI of mRCA with Promus Element DES 3.5 mm x 28 mm (4.2 -- 3.7 mm); b) Myoview 04/2011: EF 81% (small LV Cavity), fixed basal-mid Inferior Infarct, No Ischemia, 10 METS  . Complication of anesthesia   . Depression 03/08/2011   Dr. Leory Plowman- [place], [facility]  . Essential hypertension 03/07/2011  . Family history of adverse reaction to anesthesia   . GERD (gastroesophageal reflux disease)   . Headache    chocolate & red wine triggers /w bad headaches   . History of back surgery   . Hyperlipidemia with target LDL less than 70 2012    Statin Intolerance (tried Lipitor & Crestor)  . Insomnia    without significant sleeping disorders  . Iron deficiency anemia 03/09/2011  . Neuropathy   . PONV (postoperative nausea and vomiting)   . ST-segment elevation myocardial infarction (STEMI) of inferior wall (HCC) 03/07/2011   RCA Occlusion --> PCI of mRCA; ECHO 03/2011: EF > 55%, MIld Inferior HK, Mild AoV Sclerosis.  . Vocal cord polyp 2005    Past Surgical History:  Procedure Laterality Date  . ABDOMINAL HYSTERECTOMY    . APPENDECTOMY  07/04/2000   Normal appendix, adhesions noted.  Marland Kitchen BACK SURGERY     cyst on spinal cord - lumbar  . BREAST BIOPSY Right 01/31/1999   fibrocystic changes, ductal adenosis, focal atypical ductal epithelium.  Marland Kitchen CARDIAC CATHETERIZATION  03/08/2011   improved flow from the PCI  . CHOLECYSTECTOMY  03/25/1998   Dr. Lemar Livings, chronic cholecystitis.  . COLONOSCOPY WITH PROPOFOL N/A 09/28/2014   Procedure: COLONOSCOPY WITH PROPOFOL;  Surgeon: Earline Mayotte, MD;  Location: [facility] ENDOSCOPY;  Service: Endoscopy;  Laterality: N/A;  . CORONARY ANGIOPLASTY WITH STENT PLACEMENT  03/07/2011   inferior STEMI - RCA PROMUS 3.5 mm x 28 mm DES  (post Dil 3.7 distal to 4.2 prox)  . DENTAL SURGERY  2016  . DOPPLER ECHOCARDIOGRAPHY  03/27/2011   LV cavity is small,EF =>55%,MILD INFERIOR HYPOKINESIS; Mild Aortic Sclerosis  . ESOPHAGOGASTRODUODENOSCOPY N/A 09/28/2014   Procedure: ESOPHAGOGASTRODUODENOSCOPY (EGD);  Surgeon: Earline Mayotte, MD;  Location: [facility] ENDOSCOPY;  Service: Endoscopy;  Laterality: N/A;  . EYE SURGERY     blepheroplasty- bilateral  . JOINT REPLACEMENT    . KNEE SURGERY Left 2015  . KNEE SURGERY Right 2010   Baker cyst  . LEFT HEART CATHETERIZATION WITH CORONARY ANGIOGRAM N/A 03/07/2011   Procedure: LEFT HEART CATHETERIZATION WITH CORONARY ANGIOGRAM;  Surgeon: Marykay Lex, MD;  Location[facility] C[facility] LAB;  Service: Cardiovascular;  Laterality: N/A;  . LEFT  HEART CATHETERIZATION WITH CORONARY ANGIOGRAM N/A 03/08/2011   Procedure: LEFT HEART CATHETERIZATION WITH CORONARY ANGIOGRAM;  Surgeon: Marykay Lexavid W Harding, MD;  [facility];  Service: Cardiovascular;  Laterality: N/A;  . NM MYOCAR PERF WALL MOTION  04/20/2011   EF 81% (due to small LVcavity),fixed basal to mid inferior INFARCT - NO ISCHEMIA; 10 METs  . skin nodule Left 09/11/1999   dermatofibroma left lower leg, positive lateral margin.  Marland Kitchen. TOTAL KNEE ARTHROPLASTY Bilateral 07/20/2015   Procedure: TOTAL KNEE BILATERAL;  Surgeon: Loreta Aveaniel F Murphy,  MD;  [facility];  Service: Orthopedics;  Laterality: Bilateral;  . UPPER GI ENDOSCOPY  02-22-03   Dr. Lemar LivingsByrnett, normal   Family History:  Family History  Problem Relation Age of Onset  . Heart attack Mother   . Coronary artery disease Mother   . Stroke Mother   . Depression Mother   . Anxiety disorder Mother   . Alzheimer's disease Father   . Stroke Father   . Breast cancer Sister [id number]  . Depression Brother   . Drug abuse Brother   . Bipolar disorder Sister    Social History:  Social History   Social History  . Marital status: Married    Spouse name: N/A  . Number of children: N/A  . Years of education: N/A   Social History Main Topics  . Smoking status: Former Smoker    Packs/day: 0.25    Years: 40.00    Types: Cigarettes    Quit date: 03/07/2011  . Smokeless tobacco: Never Used  . Alcohol use No  . Drug use: No  . Sexual activity: Yes   Other Topics Concern  . None   Social History Narrative   Divorced woman -- Now Remarried to her long-term partner.   Exercises routinely on a daily basis roughly 45 minutes a day walking.  Quit smoking at the time of her MI. Does not drink.   Additional History:   Assessment:   Musculoskeletal: Strength & Muscle Tone: within normal limits Gait & Station: normal Patient leans: N/A  Psychiatric Specialty Exam: Medication Refill   Depression         Associated symptoms include does not have insomnia and no suicidal ideas.   Review of Systems  Psychiatric/Behavioral: Negative for depression, hallucinations, memory loss, substance abuse and suicidal ideas. The patient is not nervous/anxious and does not have insomnia.   All other systems reviewed and are negative.   There were no vitals taken for this visit.There is no height or weight on file to calculate BMI.  General Appearance: Neat and Well Groomed  Eye Contact:  Good  Speech:  Normal Rate  Volume:  Normal  Mood:  Doing well  Affect:  Smiling   Thought  Process:  Linear  Orientation:  Full (Time, Place, and Person)  Thought Content:  Negative  Suicidal Thoughts:  No  Homicidal Thoughts:  No  Memory:  Immediate;   Good Recent;   Good Remote;   Good  Judgement:  Good  Insight:  Good  Psychomotor Activity:  Negative  Concentration:  Good  Recall:  Good  Fund of Knowledge: Negative  Language: Good  Akathisia:  Negative  Handed:  Right unknown   AIMS (if indicated):  NA  Assets:  Communication Skills Desire for Improvement  ADL's:  Intact  Cognition: WNL  Sleep:  good   Is the patient at risk to self?  No. Has the patient been a risk to self in the  past 6 months?  No. Has the patient been a risk to self within the distant past?  No. Is the patient a risk to others?  No. Has the patient been a risk to others in the past 6 months?  No. Has the patient been a risk to others within the distant past?  No.  Current Medications: Current Outpatient Prescriptions  Medication Sig Dispense Refill  . benazepril (LOTENSIN) 5 MG tablet Take 1 tablet (5 mg total) by mouth daily before breakfast. 30 tablet 2  . buPROPion (WELLBUTRIN XL) 300 MG 24 hr tablet Take 1 tablet (300 mg total) by mouth every morning. 90 tablet 0  . clopidogrel (PLAVIX) 75 MG tablet Take 1 tablet (75 mg total) by mouth daily. 30 tablet 3  . Evolocumab (REPATHA SURECLICK) 140 MG/ML SOAJ Inject 1 pen into the skin every 14 (fourteen) days. 2 pen 11  . FLUoxetine (PROZAC) 10 MG capsule Take 1 capsule (10 mg total) by mouth daily. 30 capsule 2  . FLUoxetine (PROZAC) 40 MG capsule Take 1 capsule (40 mg total) by mouth daily before breakfast. 90 capsule 0  . gabapentin (NEURONTIN) 300 MG capsule Take 2 capsules (600 mg total) by mouth at bedtime. 30 capsule 0  . metoprolol tartrate (LOPRESSOR) 50 MG tablet Take 1 tablet (50 mg total) by mouth 2 (two) times daily. 180 tablet 3  . nitroGLYCERIN (NITROSTAT) 0.4 MG SL tablet Place 1 tablet (0.4 mg total) under the tongue every 5  (five) minutes as needed for chest pain (up to 3 tabs in 15 mins and then call 911). 25 tablet 1  . omeprazole (PRILOSEC) 20 MG capsule Take 1 capsule (20 mg total) by mouth daily. 90 capsule 0  . traZODone (DESYREL) 50 MG tablet Take 0.5 tablets (25 mg total) by mouth as needed.    . Venlafaxine HCl 225 MG TB24 Take 225 mg by mouth daily.     No current facility-administered medications for this visit.     Medical Decision Making:  Established Problem, Stable/Improving (1) and Review of Medication Regimen & Side Effects (2)  Treatment Plan Summary:Medication management and Plan   Major depressive disorder, recurrent, moderate- We will continue Wellbutrin XL 300 mg daily. Continue Prozac at 50mg . Patient urged to start therapy to address the Excessive guilt  Insomnia Continue trazodone at 50 mg at bedtime as needed for sleep  Patient will follow up in 3 months.  Pamula Luther 10/17/2016, 11:06 AM

## 2016-11-12 ENCOUNTER — Telehealth: Payer: Self-pay

## 2016-11-12 NOTE — Telephone Encounter (Signed)
She has enough refills. 90 day supply prescribed on 10-17-16, please check chart before requesting. Pharmacies randomly fax.

## 2016-11-12 NOTE — Telephone Encounter (Signed)
  Received a fax from the pharmacy requesting a refill on fluoxetine 40mg  pt was last seen on 10-17-16 next appt 01-07-17   Disp Refills Start End   FLUoxetine (PROZAC) 40 MG capsule 90 capsule 0 10/17/2016 10/10/2017   Sig - Route: Take 1 capsule (40 mg total) by mouth daily before breakfast. - Oral   Sent to pharmacy as: FLUoxetine (PROZAC) 40 MG capsule   E-Prescribing Status: Receipt confirmed by pharmacy (10/17/2016 11:10 AM EDT)

## 2016-11-12 NOTE — Telephone Encounter (Signed)
Received a fax requesting also the fluoxetine 10mg . Pt was last seen on  10-17-16 next appt  01-17-17   Disp Refills Start End   FLUoxetine (PROZAC) 10 MG capsule 90 capsule 1 10/17/2016 10/17/2017   Sig - Route: Take 1 capsule (10 mg total) by mouth daily. - Oral   Sent to pharmacy as: FLUoxetine (PROZAC) 10 MG capsule   E-Prescribing Status: Receipt confirmed by pharmacy (10/17/2016 11:10 AM EDT)

## 2016-11-21 ENCOUNTER — Other Ambulatory Visit: Payer: Self-pay | Admitting: Pharmacist

## 2016-11-21 MED ORDER — EVOLOCUMAB 140 MG/ML ~~LOC~~ SOAJ: 1.0000 "pen " | SUBCUTANEOUS | 5 refills | Status: DC

## 2016-11-21 NOTE — Telephone Encounter (Signed)
Prior Auth renewed and new Rx sent to pharmacy

## 2016-12-09 ENCOUNTER — Other Ambulatory Visit: Payer: Self-pay | Admitting: Physician Assistant

## 2016-12-09 DIAGNOSIS — K219 Gastro-esophageal reflux disease without esophagitis: Secondary | ICD-10-CM

## 2016-12-16 ENCOUNTER — Other Ambulatory Visit: Payer: Self-pay | Admitting: Physician Assistant

## 2016-12-16 DIAGNOSIS — K219 Gastro-esophageal reflux disease without esophagitis: Secondary | ICD-10-CM

## 2016-12-31 ENCOUNTER — Encounter: Payer: Self-pay | Admitting: Family Medicine

## 2016-12-31 ENCOUNTER — Ambulatory Visit (INDEPENDENT_AMBULATORY_CARE_PROVIDER_SITE_OTHER): Payer: BLUE CROSS/BLUE SHIELD | Admitting: Family Medicine

## 2016-12-31 VITALS — BP 100/60 | HR 60 | Temp 98.4°F | Resp 16 | Wt 166.0 lb

## 2016-12-31 DIAGNOSIS — R112 Nausea with vomiting, unspecified: Secondary | ICD-10-CM

## 2016-12-31 DIAGNOSIS — W57XXXA Bitten or stung by nonvenomous insect and other nonvenomous arthropods, initial encounter: Secondary | ICD-10-CM

## 2016-12-31 DIAGNOSIS — R5381 Other malaise: Secondary | ICD-10-CM

## 2016-12-31 MED ORDER — DOXYCYCLINE HYCLATE 100 MG PO TABS: 100.0000 mg | ORAL_TABLET | Freq: Two times a day (BID) | ORAL | 0 refills | Status: DC

## 2016-12-31 NOTE — Patient Instructions (Signed)
Rocky [place] Spotted Fever [place] spotted fever is an illness that is spread to people by infected ticks. The illness causes flulike symptoms and a reddish-purple rash. This illness can quickly become very serious. Treatment must be started right away. When the illness is not treated right away, it can sometimes lead to long-term health problems or even death. This illness is most common during warm weather when ticks are most active. What are the causes? [place] spotted fever is caused by a type of bacteria that is called Rickettsia rickettsii. This type of bacteria is carried by American dog ticks and [place] wood ticks. People get infected through a bite from a tick that is infected with the bacteria. The bite is painless, and it frequently goes unnoticed. The bacteria can also infect a person when tick blood or tick feces get into a person's body through damaged skin. A tick bite is not necessary for an infection to occur. People can get [place] spotted fever if they get a tick's blood or body fluids on their skin in the area of a small cut or sore. This could happen while removing a tick from another person or a dog. The infection is not contagious, and it cannot be spread (transmitted) from person to person. What are the signs or symptoms? Symptoms may begin 2-14 days after a tick bite. The most common early symptoms are:  Fever.  Muscle aches.  Headache.  Nausea.  Vomiting.  Poor appetite.  Abdominal pain.  The reddish-purple rash usually appears 3-5 days after the first symptoms begin. The rash often starts on the wrists and ankles. It may then spread to the palms, the soles of the feet, the legs, and the trunk. How is this diagnosed? Diagnosis is based on a physical exam, medical history, and blood tests. Your health care provider may suspect [place] spotted fever in one of these cases:  If you have recently been bitten by a tick.  If you  have been in areas that have a lot of ticks or in areas where the disease is common.  How is this treated? It is important to begin treatment right away. Treatment will usually involve the use of antibiotic medicines. In some cases, your health care provider may begin treatment before the diagnosis is confirmed. If your symptoms are severe, a hospital stay may be needed. Follow these instructions at home:  Rest as much as possible until you feel better.  Take medicines only as directed by your health care provider.  Take your antibiotic medicine as directed by your health care provider. Finish the antibiotic even if you start to feel better.  Drink enough fluid to keep your urine clear or pale yellow.  Keep all follow-up visits as directed by your health care provider. This is important. How is this prevented? Avoiding tick bites can help to prevent this illness. Take these steps to avoid tick bites when you are outdoors:  Be aware that most ticks live in shrubs, low tree branches, and grassy areas. A tick can climb onto your body when you make contact with leaves or grass where the tick is waiting.  Wear protective clothing. Long sleeves and long pants are best.  Wear white clothes so you can see ticks more easily.  Tuck your pant legs into your socks.  If you go walking on a trail, stay in the middle of the trail to avoid brushing against bushes.  Avoid walking through areas that have long   grass.  Put insect repellent on all exposed skin and along boot tops, pant legs, and sleeve cuffs.  Check clothing, hair, and skin repeatedly and before going inside.  Check family members and pets for ticks.  Brush off any ticks that are not attached.  Take a shower or a bath as soon as possible after you have been outdoors. Check your skin for ticks. The most common places on the body where ticks attach themselves are the scalp, neck, armpits, waist, and groin.  You can also greatly  reduce your chances of getting Rocky Mountain spotted fever if you remove attached ticks as soon as possible. To remove an attached tick, use a forceps or fine-point tweezers to detach the intact tick without leaving its mouth parts in the skin. The wound from the tick bite should be washed after the tick has been removed. Contact a health care provider if:  You have drainage, swelling, or increased redness or pain in the area of the rash. Get help right away if:  You have chest pain.  You have shortness of breath.  You have a severe headache.  You have a seizure.  You have severe abdominal pain.  You are feeling confused.  You are bruising easily.  You have bleeding from your gums.  You have blood in your stool. This information is not intended to replace advice given to you by your health care provider. Make sure you discuss any questions you have with your health care provider. Document Released: 07/08/2000 Document Revised: 09/01/2015 Document Reviewed: 11/09/2013 Elsevier Interactive Patient Education  2018 Elsevier Inc.  

## 2016-12-31 NOTE — Progress Notes (Signed)
Patient: Jessica Landry Female    DOB: Feb 21, 1956   61 y.o.   MRN: [id number] Visit Date: 12/31/2016  Today's Provider: Shirlee Latch, MD   Chief Complaint  Patient presents with  . Insect Bite   Subjective:    HPI   Insect Bite Pt had to remove a tick form her left thigh 5 days ago after mowing the lawn. She started noticing GI symptoms about 2 days ago, which have been worsening. She is c/o N/V, burning abdominal pain. She has also noticed headache and fatigue and generalized flu like symptoms. She denies fever and rash. She states her abdominal pain felt like heartburn, and she took nitro to R/O MI. States nitro did not improve sx. She reports she also tried Maalox for sx, without relief. She also takes Omeprazole, which usually improves the GI sx, but has not provided any relief last night or today.  Allergies  Allergen Reactions  . Bee Venom Anaphylaxis  . Statins Other (See Comments)    Myalgias & fatigue  . Etodolac   . Prednisone Palpitations and Other (See Comments)    Can take small amounts. HR increase; increased energy   . Sulfa Antibiotics Other (See Comments)    Elevated B/P, skin turns red     Current Outpatient Prescriptions:  .  benazepril (LOTENSIN) 5 MG tablet, Take 1 tablet (5 mg total) by mouth daily before breakfast., Disp: 30 tablet, Rfl: 2 .  buPROPion (WELLBUTRIN XL) 300 MG 24 hr tablet, Take 1 tablet (300 mg total) by mouth every morning., Disp: 90 tablet, Rfl: 0 .  clopidogrel (PLAVIX) 75 MG tablet, Take 1 tablet (75 mg total) by mouth daily., Disp: 30 tablet, Rfl: 3 .  Evolocumab (REPATHA SURECLICK) 140 MG/ML SOAJ, Inject 1 pen into the skin every 14 (fourteen) days., Disp: 2 pen, Rfl: 5 .  FLUoxetine (PROZAC) 10 MG capsule, Take 1 capsule (10 mg total) by mouth daily., Disp: 90 capsule, Rfl: 1 .  FLUoxetine (PROZAC) 40 MG capsule, Take 1 capsule (40 mg total) by mouth daily before breakfast., Disp: 90 capsule, Rfl: 0 .  gabapentin  (NEURONTIN) 300 MG capsule, Take 2 capsules (600 mg total) by mouth at bedtime., Disp: 30 capsule, Rfl: 0 .  metoprolol tartrate (LOPRESSOR) 50 MG tablet, Take 1 tablet (50 mg total) by mouth 2 (two) times daily., Disp: 180 tablet, Rfl: 3 .  nitroGLYCERIN (NITROSTAT) 0.4 MG SL tablet, Place 1 tablet (0.4 mg total) under the tongue every 5 (five) minutes as needed for chest pain (up to 3 tabs in 15 mins and then call 911)., Disp: 25 tablet, Rfl: 1 .  omeprazole (PRILOSEC) 20 MG capsule, TAKE ONE CAPSULE BY MOUTH DAILY, Disp: 90 capsule, Rfl: 0 .  traZODone (DESYREL) 50 MG tablet, Take 1 tablet (50 mg total) by mouth as needed. (Patient not taking: Reported on 12/31/2016), Disp: 30 tablet, Rfl: 1  Review of Systems  Constitutional: Positive for fatigue. Negative for fever.  Cardiovascular: Negative for chest pain.  Gastrointestinal: Positive for abdominal pain, diarrhea, nausea and vomiting.  Skin: Negative for rash.  Neurological: Positive for headaches.    Social History  Substance Use Topics  . Smoking status: Former Smoker    Packs/day: 0.25    Years: 40.00    Types: Cigarettes    Quit date: 03/07/2011  . Smokeless tobacco: Never Used  . Alcohol use No   Objective:   BP 100/60 (BP Location: Left Arm, Patient Position: Sitting, Cuff Size:  Normal)   Pulse 60   Temp 98.4 F (36.9 C) (Oral)   Resp 16   Wt 166 lb (75.3 kg)   BMI 26.00 kg/m  Vitals:   12/31/16 1107  BP: 100/60  Pulse: 60  Resp: 16  Temp: 98.4 F (36.9 C)  TempSrc: Oral  Weight: 166 lb (75.3 kg)     Physical Exam  Constitutional: She is oriented to person, place, and time. She appears well-developed and well-nourished. No distress.  HENT:  Head: Normocephalic and atraumatic.  Right Ear: External ear normal.  Left Ear: External ear normal.  Nose: Nose normal.  Mouth/Throat: Oropharynx is clear and moist.  Eyes: Conjunctivae are normal. No scleral icterus.  Neck: Neck supple.  Cardiovascular: Normal  rate, regular rhythm, normal heart sounds and intact distal pulses.   No murmur heard. Pulmonary/Chest: Effort normal and breath sounds normal. No respiratory distress. She has no wheezes. She has no rales.  Abdominal: Soft. Bowel sounds are normal. She exhibits no distension. There is no tenderness. There is no rebound and no guarding.  Musculoskeletal: She exhibits no edema or deformity.  Lymphadenopathy:    She has no cervical adenopathy.  Neurological: She is alert and oriented to person, place, and time.  Skin: Skin is warm and dry. No rash noted.  Psychiatric: She has a normal mood and affect. Her behavior is normal.  Vitals reviewed.      Assessment & Plan:     1. Tick bite, initial encounter - symptoms concerning for tick-borne illness - check CMP, CBC and lyme and RMSF Antibodies - if labs all normal could be viral illness - treat empirically until labs result with Doxycycline - B. Burgdorfi Antibodies - Rocky mtn spotted fvr abs pnl(IgG+IgM) - CBC w/Diff/Platelet - COMPLETE METABOLIC PANEL WITH GFR  2. Malaise - as above, concerning for tick-borne illness - B. Burgdorfi Antibodies - Rocky mtn spotted fvr abs pnl(IgG+IgM) - CBC w/Diff/Platelet - COMPLETE METABOLIC PANEL WITH GFR  3. Non-intractable vomiting with nausea, unspecified vomiting type - as above, concerning for tick-borne illness - B. Burgdorfi Antibodies - Rocky mtn spotted fvr abs pnl(IgG+IgM) - CBC w/Diff/Platelet - COMPLETE METABOLIC PANEL WITH GFR  Meds ordered this encounter  Medications  . doxycycline (VIBRA-TABS) 100 MG tablet    Sig: Take 1 tablet (100 mg total) by mouth 2 (two) times daily.    Dispense:  20 tablet    Refill:  0       The entirety of the information documented in the History of Present Illness, Review of Systems and Physical Exam were personally obtained by me. Portions of this information were initially documented by Irving Burton Ratchford, CMA and reviewed by me for thoroughness  and accuracy.     Shirlee Latch, MD  [facility]

## 2017-01-01 LAB — COMPLETE METABOLIC PANEL WITH GFR
AG RATIO: 1.7 (calc) (ref 1.0–2.5)
ALT: 16 U/L (ref 6–29)
AST: 18 U/L (ref 10–35)
Albumin: 4.1 g/dL (ref 3.6–5.1)
Alkaline phosphatase (APISO): 87 U/L (ref 33–130)
BUN: 16 mg/dL (ref 7–25)
CALCIUM: 9.4 mg/dL (ref 8.6–10.4)
CO2: 29 mmol/L (ref 20–32)
CREATININE: 0.97 mg/dL (ref 0.50–0.99)
Chloride: 102 mmol/L (ref 98–110)
GFR, EST AFRICAN AMERICAN: 73 mL/min/{1.73_m2} (ref >=60)
GFR, EST NON AFRICAN AMERICAN: 63 mL/min/{1.73_m2} (ref >=60)
GLOBULIN: 2.4 g/dL (ref 1.9–3.7)
Glucose, Bld: 108 mg/dL — ABNORMAL HIGH (ref 65–99)
Potassium: 5 mmol/L (ref 3.5–5.3)
SODIUM: 138 mmol/L (ref 135–146)
TOTAL PROTEIN: 6.5 g/dL (ref 6.1–8.1)
Total Bilirubin: 0.7 mg/dL (ref 0.2–1.2)

## 2017-01-01 LAB — CBC WITH DIFFERENTIAL/PLATELET
BASOS PCT: 0.6 %
Basophils Absolute: 46 cells/uL (ref 0–200)
Eosinophils Absolute: 131 cells/uL (ref 15–500)
Eosinophils Relative: 1.7 %
HCT: 40 % (ref 35.0–45.0)
Hemoglobin: 13 g/dL (ref 11.7–15.5)
Lymphs Abs: 1409 cells/uL (ref 850–3900)
MCH: 28.8 pg (ref 27.0–33.0)
MCHC: 32.5 g/dL (ref 32.0–36.0)
MCV: 88.7 fL (ref 80.0–100.0)
MONOS PCT: 5.9 %
MPV: 10.5 fL (ref 7.5–12.5)
NEUTROS PCT: 73.5 %
Neutro Abs: 5660 cells/uL (ref 1500–7800)
PLATELETS: 306 10*3/uL (ref 140–400)
RBC: 4.51 10*6/uL (ref 3.80–5.10)
RDW: 12.3 % (ref 11.0–15.0)
TOTAL LYMPHOCYTE: 18.3 %
WBC mixed population: 454 cells/uL (ref 200–950)
WBC: 7.7 10*3/uL (ref 3.8–10.8)

## 2017-01-01 LAB — ROCKY MTN SPOTTED FVR ABS PNL(IGG+IGM)
RMSF IgG: NOT DETECTED
RMSF IgM: NOT DETECTED

## 2017-01-01 LAB — B. BURGDORFI ANTIBODIES: B burgdorferi Ab IgG+IgM: <0.9 index

## 2017-01-02 ENCOUNTER — Telehealth: Payer: Self-pay

## 2017-01-02 NOTE — Telephone Encounter (Signed)
-----   Message from Erasmo Downer, MD sent at 01/02/2017  9:30 AM EDT ----- Normal kidney function, liver function, electrolytes, Blood counts.  Blood sugar is slightly elevated if you had been fasting.  Negative lyme and RMSF testing.  Can stop doxycycline.  Erasmo Downer, MD, MPH [facility] 01/02/2017 9:30 AM

## 2017-01-02 NOTE — Telephone Encounter (Signed)
Pt's husband Brett Canales was advised of results. OK per DPR.

## 2017-01-07 ENCOUNTER — Telehealth: Payer: Self-pay | Admitting: Cardiology

## 2017-01-07 DIAGNOSIS — Z79899 Other long term (current) drug therapy: Secondary | ICD-10-CM

## 2017-01-07 DIAGNOSIS — I2510 Atherosclerotic heart disease of native coronary artery without angina pectoris: Secondary | ICD-10-CM

## 2017-01-07 DIAGNOSIS — E785 Hyperlipidemia, unspecified: Secondary | ICD-10-CM

## 2017-01-07 DIAGNOSIS — Z9861 Coronary angioplasty status: Secondary | ICD-10-CM

## 2017-01-07 DIAGNOSIS — I2119 ST elevation (STEMI) myocardial infarction involving other coronary artery of inferior wall: Secondary | ICD-10-CM

## 2017-01-07 NOTE — Telephone Encounter (Signed)
New Message    Pt would like to know if she needs to have lab work before yearly check up with Dr Herbie Baltimore?

## 2017-01-07 NOTE — Telephone Encounter (Signed)
Spoke to patient  Labs not checked in 6 months after starting repatha.  lab slip mailed to patient . Patient aware to have labs done prior to nov office visit

## 2017-01-17 ENCOUNTER — Ambulatory Visit: Payer: BLUE CROSS/BLUE SHIELD | Admitting: Psychiatry

## 2017-01-22 ENCOUNTER — Encounter: Payer: Self-pay | Admitting: Psychiatry

## 2017-01-22 ENCOUNTER — Ambulatory Visit (INDEPENDENT_AMBULATORY_CARE_PROVIDER_SITE_OTHER): Payer: BLUE CROSS/BLUE SHIELD | Admitting: Psychiatry

## 2017-01-22 VITALS — BP 111/67 | HR 62 | Temp 98.2°F | Wt 169.6 lb

## 2017-01-22 DIAGNOSIS — Z634 Disappearance and death of family member: Secondary | ICD-10-CM

## 2017-01-22 DIAGNOSIS — F331 Major depressive disorder, recurrent, moderate: Secondary | ICD-10-CM

## 2017-01-22 MED ORDER — FLUOXETINE HCL 10 MG PO CAPS: 10.0000 mg | ORAL_CAPSULE | Freq: Every day | ORAL | 1 refills | Status: DC

## 2017-01-22 MED ORDER — BUPROPION HCL ER (XL) 300 MG PO TB24: 300.0000 mg | ORAL_TABLET | ORAL | 1 refills | Status: DC

## 2017-01-22 MED ORDER — FLUOXETINE HCL 40 MG PO CAPS: 40.0000 mg | ORAL_CAPSULE | Freq: Every day | ORAL | 1 refills | Status: DC

## 2017-01-22 NOTE — Progress Notes (Signed)
Patient ID: Jessica Landry, female   DOB: October 12, 1955, 61 y.o.   MRN: [id number]   [facility] MD/PA/NP OP Progress Note  01/22/2017 3:08 PM Jessica Landry  MRN:  [id number]  Subjective:  Patient returns for follow-up of her major depressive disorder, recurrent moderate.   She reports that she has been doing quite well. She and her husband have found a new church and she is enjoying going there. She is also made several new friends and is finding it quite therapeutic to spend time with these women and exchange their life stories. Reports she is sleeping well and eating well and has been gaining weight recently. She ran out of the Wellbutrin and states that for the last 5 days she did not take any and is feeling slightly lethargic. Overall she feels that she is moving forward though she continues to miss her mother.  Chief Complaint: Doing well Chief Complaint    Follow-up; Medication Refill     Visit Diagnosis:     ICD-10-CM   1. Major depressive disorder, recurrent episode, moderate (HCC) F33.1   2. Bereavement Z63.4     Past Medical History:  Past Medical History:  Diagnosis Date  . Anxiety   . Arthritis    knees & back   . CAD S/P percutaneous coronary angioplasty 03/07/2011   a) PCI of mRCA with Promus Element DES 3.5 mm x 28 mm (4.2 -- 3.7 mm); b) Myoview 04/2011: EF 81% (small LV Cavity), fixed basal-mid Inferior Infarct, No Ischemia, 10 METS  . Complication of anesthesia   . Depression 03/08/2011   Dr. Leory Plowman- [place], [facility]  . Essential hypertension 03/07/2011  . Family history of adverse reaction to anesthesia   . GERD (gastroesophageal reflux disease)   . Headache    chocolate & red wine triggers /w bad headaches   . History of back surgery   . Hyperlipidemia with target LDL less than 70 2012    Statin Intolerance (tried Lipitor & Crestor)  . Insomnia    without significant sleeping disorders  . Iron deficiency anemia 03/09/2011  . Neuropathy   . PONV (postoperative  nausea and vomiting)   . ST-segment elevation myocardial infarction (STEMI) of inferior wall (HCC) 03/07/2011   RCA Occlusion --> PCI of mRCA; ECHO 03/2011: EF > 55%, MIld Inferior HK, Mild AoV Sclerosis.  . Vocal cord polyp 2005    Past Surgical History:  Procedure Laterality Date  . ABDOMINAL HYSTERECTOMY    . APPENDECTOMY  07/04/2000   Normal appendix, adhesions noted.  Marland Kitchen BACK SURGERY     cyst on spinal cord - lumbar  . BREAST BIOPSY Right 01/31/1999   fibrocystic changes, ductal adenosis, focal atypical ductal epithelium.  Marland Kitchen CARDIAC CATHETERIZATION  03/08/2011   improved flow from the PCI  . CHOLECYSTECTOMY  03/25/1998   Dr. Lemar Livings, chronic cholecystitis.  . COLONOSCOPY WITH PROPOFOL N/A 09/28/2014   Procedure: COLONOSCOPY WITH PROPOFOL;  Surgeon: Earline Mayotte, MD;  Location: [facility] ENDOSCOPY;  Service: Endoscopy;  Laterality: N/A;  . CORONARY ANGIOPLASTY WITH STENT PLACEMENT  03/07/2011   inferior STEMI - RCA PROMUS 3.5 mm x 28 mm DES  (post Dil 3.7 distal to 4.2 prox)  . DENTAL SURGERY  2016  . DOPPLER ECHOCARDIOGRAPHY  03/27/2011   LV cavity is small,EF =>55%,MILD INFERIOR HYPOKINESIS; Mild Aortic Sclerosis  . ESOPHAGOGASTRODUODENOSCOPY N/A 09/28/2014   Procedure: ESOPHAGOGASTRODUODENOSCOPY (EGD);  Surgeon: Earline Mayotte, MD;  Location: [facility] ENDOSCOPY;  Service: Endoscopy;  Laterality: N/A;  . EYE  SURGERY     blepheroplasty- bilateral  . JOINT REPLACEMENT    . KNEE SURGERY Left 2015  . KNEE SURGERY Right 2010   Baker cyst  . LEFT HEART CATHETERIZATION WITH CORONARY ANGIOGRAM N/A 03/07/2011   Procedure: LEFT HEART CATHETERIZATION WITH CORONARY ANGIOGRAM;  Surgeon: Marykay Lex, MD;  Location: [facility] CATH LAB;  Service: Cardiovascular;  Laterality: N/A;  . LEFT HEART CATHETERIZATION WITH CORONARY ANGIOGRAM N/A 03/08/2011   Procedure: LEFT HEART CATHETERIZATION WITH CORONARY ANGIOGRAM;  Surgeon: Marykay Lex, MD;  Location[facility] C[facility];  Service: Cardiovascular;   Laterality: N/A;  . NM MYOCAR PERF WALL MOTION  04/20/2011   EF 81% (due to small LVcavity),fixed basal to mid inferior INFARCT - NO ISCHEMIA; 10 METs  . skin nodule Left 09/11/1999   dermatofibroma left lower leg, positive lateral margin.  Marland Kitchen TOTAL KNEE ARTHROPLASTY Bilateral 07/20/2015   Procedure: TOTAL KNEE BILATERAL;  Surgeon: Loreta Ave, MD;  Location: [facility] OR;  Service: Orthopedics;  Laterality: Bilateral;  . UPPER GI ENDOSCOPY  02-22-03   Dr. Lemar Livings, normal   Family History:  Family History  Problem Relation Age of Onset  . Heart attack Mother   . Coronary artery disease Mother   . Stroke Mother   . Depression Mother   . Anxiety disorder Mother   . Alzheimer's disease Father   . Stroke Father   . Breast cancer Sister 75  . Depression Brother   . Drug abuse Brother   . Bipolar disorder Sister    Social History:  Social History   Social History  . Marital status: Married    Spouse name: N/A  . Number of children: N/A  . Years of education: N/A   Social History Main Topics  . Smoking status: Former Smoker    Packs/day: 0.25    Years: 40.00    Types: Cigarettes    Quit date: 03/07/2011  . Smokeless tobacco: Never Used  . Alcohol use No  . Drug use: No  . Sexual activity: Yes   Other Topics Concern  . None   Social History Narrative   Divorced woman -- Now Remarried to her long-term partner.   Exercises routinely on a daily basis roughly 45 minutes a day walking.  Quit smoking at the time of her MI. Does not drink.   Additional History:   Assessment:   Musculoskeletal: Strength & Muscle Tone: within normal limits Gait & Station: normal Patient leans: N/A  Psychiatric Specialty Exam: Medication Refill   Depression         Associated symptoms include does not have insomnia and no suicidal ideas.   Review of Systems  Psychiatric/Behavioral: Negative for depression, hallucinations, memory loss, substance abuse and suicidal ideas. The patient is not  nervous/anxious and does not have insomnia.   All other systems reviewed and are negative.   Blood pressure 111/67, pulse 62, temperature 98.2 F (36.8 C), temperature source Oral, weight 169 lb 9.6 oz (76.9 kg).Body mass index is 26.56 kg/m.  General Appearance: Neat and Well Groomed  Eye Contact:  Good  Speech:  Normal Rate  Volume:  Normal  Mood:  Doing well  Affect:  Smiling   Thought Process:  Linear  Orientation:  Full (Time, Place, and Person)  Thought Content:  Negative  Suicidal Thoughts:  No  Homicidal Thoughts:  No  Memory:  Immediate;   Good Recent;   Good Remote;   Good  Judgement:  Good  Insight:  Good  Psychomotor  Activity:  Negative  Concentration:  Good  Recall:  Good  Fund of Knowledge: Negative  Language: Good  Akathisia:  Negative  Handed:  Right unknown   AIMS (if indicated):  NA  Assets:  Communication Skills Desire for Improvement  ADL's:  Intact  Cognition: WNL  Sleep:  good   Is the patient at risk to self?  No. Has the patient been a risk to self in the past 6 months?  No. Has the patient been a risk to self within the distant past?  No. Is the patient a risk to others?  No. Has the patient been a risk to others in the past 6 months?  No. Has the patient been a risk to others within the distant past?  No.  Current Medications: Current Outpatient Prescriptions  Medication Sig Dispense Refill  . benazepril (LOTENSIN) 5 MG tablet Take 1 tablet (5 mg total) by mouth daily before breakfast. 30 tablet 2  . buPROPion (WELLBUTRIN XL) 300 MG 24 hr tablet Take 1 tablet (300 mg total) by mouth every morning. 90 tablet 0  . clopidogrel (PLAVIX) 75 MG tablet Take 1 tablet (75 mg total) by mouth daily. 30 tablet 3  . doxycycline (VIBRA-TABS) 100 MG tablet Take 1 tablet (100 mg total) by mouth 2 (two) times daily. 20 tablet 0  . Evolocumab (REPATHA SURECLICK) 140 MG/ML SOAJ Inject 1 pen into the skin every 14 (fourteen) days. 2 pen 5  . FLUoxetine  (PROZAC) 10 MG capsule Take 1 capsule (10 mg total) by mouth daily. 90 capsule 1  . FLUoxetine (PROZAC) 40 MG capsule Take 1 capsule (40 mg total) by mouth daily before breakfast. 90 capsule 0  . gabapentin (NEURONTIN) 300 MG capsule Take 2 capsules (600 mg total) by mouth at bedtime. 30 capsule 0  . metoprolol tartrate (LOPRESSOR) 50 MG tablet Take 1 tablet (50 mg total) by mouth 2 (two) times daily. 180 tablet 3  . nitroGLYCERIN (NITROSTAT) 0.4 MG SL tablet Place 1 tablet (0.4 mg total) under the tongue every 5 (five) minutes as needed for chest pain (up to 3 tabs in 15 mins and then call 911). 25 tablet 1  . omeprazole (PRILOSEC) 20 MG capsule TAKE ONE CAPSULE BY MOUTH DAILY 90 capsule 0  . traZODone (DESYREL) 50 MG tablet Take 1 tablet (50 mg total) by mouth as needed. 30 tablet 1   No current facility-administered medications for this visit.       Medical Decision Making:  Established Problem, Stable/Improving (1) and Review of Medication Regimen & Side Effects (2)  Treatment Plan Summary:Medication management and Plan   Major depressive disorder, recurrent, moderate- We will continue Wellbutrin XL 300 mg daily. Continue Prozac at .   Insomnia Resolved Discontinue the trazodone  Patient will follow up in 3 months.  Kadian Barcellos 01/22/2017, 3:08 PM

## 2017-02-05 LAB — COMPREHENSIVE METABOLIC PANEL
A/G RATIO: 2.2 (ref 1.2–2.2)
ALT: 16 IU/L (ref 0–32)
AST: 20 IU/L (ref 0–40)
Albumin: 4.4 g/dL (ref 3.6–4.8)
Alkaline Phosphatase: 98 IU/L (ref 39–117)
BILIRUBIN TOTAL: 0.3 mg/dL (ref 0.0–1.2)
BUN / CREAT RATIO: 14 (ref 12–28)
BUN: 16 mg/dL (ref 8–27)
CALCIUM: 9.4 mg/dL (ref 8.7–10.3)
CHLORIDE: 101 mmol/L (ref 96–106)
CO2: 27 mmol/L (ref 20–29)
Creatinine, Ser: 1.12 mg/dL — ABNORMAL HIGH (ref 0.57–1.00)
GFR, EST AFRICAN AMERICAN: 61 mL/min/{1.73_m2} (ref >59)
GFR, EST NON AFRICAN AMERICAN: 53 mL/min/{1.73_m2} — AB (ref >59)
GLOBULIN, TOTAL: 2 g/dL (ref 1.5–4.5)
Glucose: 93 mg/dL (ref 65–99)
POTASSIUM: 4.3 mmol/L (ref 3.5–5.2)
SODIUM: 139 mmol/L (ref 134–144)
Total Protein: 6.4 g/dL (ref 6.0–8.5)

## 2017-02-05 LAB — LIPID PANEL
CHOL/HDL RATIO: 1.9 ratio (ref 0.0–4.4)
CHOLESTEROL TOTAL: 174 mg/dL (ref 100–199)
HDL: 93 mg/dL (ref >39)
LDL Calculated: 68 mg/dL (ref 0–99)
TRIGLYCERIDES: 63 mg/dL (ref 0–149)
VLDL Cholesterol Cal: 13 mg/dL (ref 5–40)

## 2017-02-13 ENCOUNTER — Encounter: Payer: Self-pay | Admitting: Cardiology

## 2017-02-13 ENCOUNTER — Ambulatory Visit: Payer: BLUE CROSS/BLUE SHIELD | Admitting: Cardiology

## 2017-02-13 VITALS — BP 125/76 | HR 56 | Ht 67.0 in | Wt 169.4 lb

## 2017-02-13 DIAGNOSIS — Z9861 Coronary angioplasty status: Secondary | ICD-10-CM | POA: Diagnosis not present

## 2017-02-13 DIAGNOSIS — R55 Syncope and collapse: Secondary | ICD-10-CM | POA: Diagnosis not present

## 2017-02-13 DIAGNOSIS — E785 Hyperlipidemia, unspecified: Secondary | ICD-10-CM | POA: Diagnosis not present

## 2017-02-13 DIAGNOSIS — I10 Essential (primary) hypertension: Secondary | ICD-10-CM

## 2017-02-13 DIAGNOSIS — I2510 Atherosclerotic heart disease of native coronary artery without angina pectoris: Secondary | ICD-10-CM | POA: Diagnosis not present

## 2017-02-13 NOTE — Patient Instructions (Signed)
NO CHANGES WITH CURRENT MEDICATIONS   Your physician wants you to follow-up in 12 MONTHS WITH DR HARDING. You will receive a reminder letter in the mail two months in advance. If you don't receive a letter, please call our office to schedule the follow-up appointment.   If you need a refill on your cardiac medications before your next appointment, please call your pharmacy.  

## 2017-02-13 NOTE — Assessment & Plan Note (Signed)
Well-controlled on current meds.  Stable.  No change

## 2017-02-13 NOTE — Assessment & Plan Note (Signed)
No further episodes.  We evaluated for possible subclavian steal, but the carotid Dopplers were normal.  I think it was probably more vasovagal in vertigo-like in nature.  Continue to ensure that she stays adequately hydrated.

## 2017-02-13 NOTE — Assessment & Plan Note (Signed)
Excellent results having started Repatha.  Continue to follow-up.  Would try to get LDL down to 50 if possible.

## 2017-02-13 NOTE — Progress Notes (Signed)
PCP: Trey SailorsPollak, Adriana M, PA-C  Clinic Note: Chief Complaint  Patient presents with  . Follow-up    Left upper quadrant/lower chest discomfort  . Coronary Artery Disease    HPI: Jessica Landry is a 61 y.o. female with a PMH below who presents today for six-month follow-up for history of inferior MI and PCI to the RCA as well as a relatively recent episode of syncope/near syncope.  Jessica Landry was last seen on July 24, 2016.  This was a follow-up for an episode of syncope.  She is also had some strange chest discomfort episodes.  Recent Hospitalizations:  Aug 13, 2016 ER visit for chest pain - negative Myoview   Studies Personally Reviewed - (if available, images/films reviewed: From Epic Chart or Care Everywhere)  Carotid Dopplers May 2018 -normal  Myoview Stress Test May 2018: Low risk.  No evidence of ischemia or infarction.  Normal EF/hyperdynamic >65%.  Interval History: Jessica Landry returns today really only noticing some pain along the left lower rib cage radiating to the back.  It is not necessarily exertional & is there at rest and with exertion.  Otherwise she is doing well she has not had any recurrence of her anginal type chest pain with rest or exertion.  No further syncope or near syncope.  No resting or exertional chest tightness/dyspnea.  No heart failure symptoms of PND, orthopnea or edema. She may have a few skipped beats every now and then, but no rapid irregular heartbeats or palpitations.  No syncope/near syncope or TIAs or amaurosis fugax.  No melena, hematochezia, hematuria, or epstaxis. No claudication.  She is extremely excited about the results of her lipids having started Repatha.  She actually says she feels clinically better than she has in a long time is unusual finding.  ROS: A comprehensive was performed. Review of Systems  Constitutional: Negative for malaise/fatigue.  Respiratory: Negative for cough and shortness of breath.   Gastrointestinal:  Negative for abdominal pain and heartburn.  Genitourinary: Negative for dysuria.  Musculoskeletal: Negative for joint pain and myalgias.  Neurological: Negative for dizziness.  Psychiatric/Behavioral: Negative for depression. The patient is not nervous/anxious.   All other systems reviewed and are negative.  I have reviewed and (if needed) personally updated the patient's problem list, medications, allergies, past medical and surgical history, social and family history.   Past Medical History:  Diagnosis Date  . Anxiety   . Arthritis    knees & back   . CAD S/P percutaneous coronary angioplasty 03/07/2011   a) PCI of mRCA with Promus Element DES 3.5 mm x 28 mm (4.2 -- 3.7 mm); b) Myoview 04/2011: EF 81% (small LV Cavity), fixed basal-mid Inferior Infarct, No Ischemia, 10 METS  . Complication of anesthesia   . Depression 03/08/2011   [place] [place]  . Essential hypertension 03/07/2011  . Family history of adverse reaction to anesthesia   . GERD (gastroesophageal reflux disease)   . Headache    chocolate & red wine triggers /w bad headaches   . History of back surgery   . Hyperlipidemia with target LDL less than 70 2012    Statin Intolerance (tried Lipitor & Crestor)  . Insomnia    without significant sleeping disorders  . Iron deficiency anemia 03/09/2011  . Neuropathy   . PONV (postoperative nausea and vomiting)   . ST-segment elevation myocardial infarction (STEMI) of inferior wall (HCC) 03/07/2011   RCA Occlusion --> PCI of mRCA; ECHO 03/2011: EF > 55%,  MIld Inferior HK, Mild AoV Sclerosis.  . Vocal cord polyp 2005    Past Surgical History:  Procedure Laterality Date  . ABDOMINAL HYSTERECTOMY    . APPENDECTOMY  07/04/2000   Normal appendix, adhesions noted.  Marland Kitchen. BACK SURGERY     cyst on spinal cord - lumbar  . BREAST BIOPSY Right 01/31/1999   fibrocystic changes, ductal adenosis, focal atypical ductal epithelium.  Marland Kitchen. CARDIAC CATHETERIZATION  03/08/2011    improved flow from the PCI  . CHOLECYSTECTOMY  03/25/1998   Dr. Lemar LivingsByrnett, chronic cholecystitis.  . CORONARY ANGIOPLASTY WITH STENT PLACEMENT  03/07/2011   inferior STEMI - RCA PROMUS 3.5 mm x 28 mm DES  (post Dil 3.7 distal to 4.2 prox)  . DENTAL SURGERY  2016  . DOPPLER ECHOCARDIOGRAPHY  03/27/2011   LV cavity is small,EF =>55%,MILD INFERIOR HYPOKINESIS; Mild Aortic Sclerosis  . EYE SURGERY     blepheroplasty- bilateral  . JOINT REPLACEMENT    . KNEE SURGERY Left 2015  . KNEE SURGERY Right 2010   Baker cyst  . NM MYOCAR PERF WALL MOTION  08/2016   Lexiscan: LOW RISK.  No ischemia or infarction.  Hyperdynamic LV function .  EF> 65%  . skin nodule Left 09/11/1999   dermatofibroma left lower leg, positive lateral margin.  Marland Kitchen. UPPER GI ENDOSCOPY  02-22-03   Dr. Lemar LivingsByrnett, normal    Current Meds  Medication Sig  . benazepril (LOTENSIN) 5 MG tablet Take 1 tablet (5 mg total) by mouth daily before breakfast.  . buPROPion (WELLBUTRIN XL) 300 MG 24 hr tablet Take 1 tablet (300 mg total) by mouth every morning.  . clopidogrel (PLAVIX) 75 MG tablet Take 1 tablet (75 mg total) by mouth daily.  . Evolocumab (REPATHA SURECLICK) 140 MG/ML SOAJ Inject 1 pen into the skin every 14 (fourteen) days.  Marland Kitchen. FLUoxetine (PROZAC) 10 MG capsule Take 1 capsule (10 mg total) by mouth daily.  Marland Kitchen. FLUoxetine (PROZAC) 40 MG capsule Take 1 capsule (40 mg total) by mouth daily before breakfast.  . gabapentin (NEURONTIN) 300 MG capsule Take 2 capsules (600 mg total) by mouth at bedtime.  . metoprolol tartrate (LOPRESSOR) 50 MG tablet Take 1 tablet (50 mg total) by mouth 2 (two) times daily.  . nitroGLYCERIN (NITROSTAT) 0.4 MG SL tablet Place 1 tablet (0.4 mg total) under the tongue every 5 (five) minutes as needed for chest pain (up to 3 tabs in 15 mins and then call 911).  Marland Kitchen. omeprazole (PRILOSEC) 20 MG capsule TAKE ONE CAPSULE BY MOUTH DAILY    Allergies  Allergen Reactions  . Bee Venom Anaphylaxis  . Statins Other  (See Comments)    Myalgias & fatigue  . Etodolac   . Prednisone Palpitations and Other (See Comments)    Can take small amounts. HR increase; increased energy   . Sulfa Antibiotics Other (See Comments)    Elevated B/P, skin turns red    Social History   Socioeconomic History  . Marital status: Married    Spouse name: None  . Number of children: None  . Years of education: None  . Highest education level: None  Social Needs  . Financial resource strain: None  . Food insecurity - worry: None  . Food insecurity - inability: None  . Transportation needs - medical: None  . Transportation needs - non-medical: None  Occupational History  . None  Tobacco Use  . Smoking status: Former Smoker    Packs/day: 0.25    Years: 40.00  Pack years: 10.00    Types: Cigarettes    Last attempt to quit: 03/07/2011    Years since quitting: 5.9  . Smokeless tobacco: Never Used  Substance and Sexual Activity  . Alcohol use: No    Alcohol/week: 0.0 oz  . Drug use: No  . Sexual activity: Yes  Other Topics Concern  . None  Social History Narrative   Divorced woman -- Now Remarried to her long-term partner.   Exercises routinely on a daily basis roughly 45 minutes a day walking.  Quit smoking at the time of her MI. Does not drink.    family history includes Alzheimer's disease in her father; Anxiety disorder in her mother; Bipolar disorder in her sister; Breast cancer (age of onset: 84) in her sister; Coronary artery disease in her mother; Depression in her brother and mother; Drug abuse in her brother; Heart attack in her mother; Stroke in her father and mother.  Wt Readings from Last 3 Encounters:  02/13/17 169 lb 6.4 oz (76.8 kg)  12/31/16 166 lb (75.3 kg)  10/08/16 168 lb (76.2 kg)    PHYSICAL EXAM BP 125/76   Pulse (!) 56   Ht 5\' 7"  (1.702 m)   Wt 169 lb 6.4 oz (76.8 kg)   SpO2 98%   BMI 26.53 kg/m  Physical Exam  Constitutional: She is oriented to person, place, and time.  She appears well-developed and well-nourished. No distress.  Healthy-appearing.  Well-groomed.  In very good spirits  HENT:  Head: Normocephalic and atraumatic.  Eyes: EOM are normal.  Neck: Normal range of motion. Neck supple. No hepatojugular reflux and no JVD present. Carotid bruit is not present.  Cardiovascular: Normal rate, regular rhythm and intact distal pulses.  No extrasystoles are present. Exam reveals no gallop and no friction rub.  Murmur heard. High-pitched harsh crescendo-decrescendo midsystolic murmur is present with a grade of 1/6 at the upper right sternal border radiating to the neck. Pulmonary/Chest: Effort normal and breath sounds normal. No respiratory distress. She has no wheezes. She has no rales.  She has tenderness all along the lower 2 or 3 ribs.  From below her breast down to the floating rib costochondral margin.  Also tenderness following the ribs back to the back.  Abdominal: Soft. Bowel sounds are normal. She exhibits no distension. There is no tenderness. There is no rebound.  Musculoskeletal: Normal range of motion. She exhibits no edema or deformity.  Neurological: She is alert and oriented to person, place, and time.  Skin: Skin is warm and dry.  Psychiatric: She has a normal mood and affect. Her behavior is normal. Judgment and thought content normal.  Nursing note and vitals reviewed.    Adult ECG Report None  Other studies Reviewed: Additional studies/ records that were reviewed today include:  Recent Labs:    Lab Results  Component Value Date   CHOL 174 02/04/2017   HDL 93 02/04/2017   LDLCALC 68 02/04/2017   TRIG 63 02/04/2017   CHOLHDL 1.9 02/04/2017    ASSESSMENT / PLAN: Problem List Items Addressed This Visit    Benign essential HTN (Chronic)    Well-controlled on current meds.  Stable.  No change      CAD S/P PCI mRCA: Promus Element DES 3.5 mm x 28 mm (4.2-37 mm)) - Primary (Chronic)    She really only has the one stent in the RCA  and otherwise mild disease elsewhere.  Just had a Myoview stress test that was negative for  ischemia in the setting of some atypical chest discomfort.  She is on stable dose of beta-blocker, ACE inhibitor as well as Plavix without aspirin. She is on Repatha for lipids.  Stable, no change.      Hyperlipidemia with target LDL less than 70; Statin Intolerant (Chronic)    Excellent results having started Repatha.  Continue to follow-up.  Would try to get LDL down to 50 if possible.      Syncope and collapse    No further episodes.  We evaluated for possible subclavian steal, but the carotid Dopplers were normal.  I think it was probably more vasovagal in vertigo-like in nature.  Continue to ensure that she stays adequately hydrated.         Current medicines are reviewed at length with the patient today. (+/- concerns) n/a -- Loves Repatha results. The following changes have been made: n/a  Patient Instructions  NO CHANGES WITH CURRENT MEDICATIONS    Your physician wants you to follow-up in 12 MONTHS WITH DR HARDING. You will receive a reminder letter in the mail two months in advance. If you don't receive a letter, please call our office to schedule the follow-up appointment.   If you need a refill on your cardiac medications before your next appointment, please call your pharmacy.    Studies Ordered:   No orders of the defined types were placed in this encounter.     Bryan Lemma, M.D., M.S. Interventional Cardiologist   Pager # [phone] Phone # [phone] [address] [place], [place] [address]

## 2017-02-13 NOTE — Assessment & Plan Note (Signed)
She really only has the one stent in the RCA and otherwise mild disease elsewhere.  Just had a Myoview stress test that was negative for ischemia in the setting of some atypical chest discomfort.  She is on stable dose of beta-blocker, ACE inhibitor as well as Plavix without aspirin. She is on Repatha for lipids.  Stable, no change.

## 2017-03-11 ENCOUNTER — Encounter: Payer: Self-pay | Admitting: Family Medicine

## 2017-03-11 ENCOUNTER — Ambulatory Visit (INDEPENDENT_AMBULATORY_CARE_PROVIDER_SITE_OTHER): Payer: BLUE CROSS/BLUE SHIELD | Admitting: Family Medicine

## 2017-03-11 VITALS — BP 124/84 | HR 68 | Temp 98.3°F | Resp 16 | Ht 67.0 in | Wt 170.0 lb

## 2017-03-11 DIAGNOSIS — E785 Hyperlipidemia, unspecified: Secondary | ICD-10-CM | POA: Diagnosis not present

## 2017-03-11 DIAGNOSIS — F325 Major depressive disorder, single episode, in full remission: Secondary | ICD-10-CM

## 2017-03-11 DIAGNOSIS — Z955 Presence of coronary angioplasty implant and graft: Secondary | ICD-10-CM | POA: Diagnosis not present

## 2017-03-11 DIAGNOSIS — I10 Essential (primary) hypertension: Secondary | ICD-10-CM | POA: Diagnosis not present

## 2017-03-11 DIAGNOSIS — Z87891 Personal history of nicotine dependence: Secondary | ICD-10-CM | POA: Diagnosis not present

## 2017-03-11 DIAGNOSIS — Z1159 Encounter for screening for other viral diseases: Secondary | ICD-10-CM | POA: Diagnosis not present

## 2017-03-11 DIAGNOSIS — Z1231 Encounter for screening mammogram for malignant neoplasm of breast: Secondary | ICD-10-CM | POA: Diagnosis not present

## 2017-03-11 DIAGNOSIS — Z0000 Encounter for general adult medical examination without abnormal findings: Secondary | ICD-10-CM

## 2017-03-11 DIAGNOSIS — Z23 Encounter for immunization: Secondary | ICD-10-CM

## 2017-03-11 DIAGNOSIS — E663 Overweight: Secondary | ICD-10-CM | POA: Diagnosis not present

## 2017-03-11 DIAGNOSIS — Z862 Personal history of diseases of the blood and blood-forming organs and certain disorders involving the immune mechanism: Secondary | ICD-10-CM

## 2017-03-11 LAB — HEPATITIS C ANTIBODY
Hepatitis C Ab: NONREACTIVE
SIGNAL TO CUT-OFF: 0.01 (ref <1.00)

## 2017-03-11 NOTE — Progress Notes (Signed)
Patient: Jessica Landry, Female    DOB: 06/30/1955, 61 y.o.   MRN: [id number] Visit Date: 03/11/2017  Today's Provider: Shirlee Latch, MD   Chief Complaint  Patient presents with  . Annual Exam   Subjective:    Annual physical exam  Jessica Landry is a 61 y.o. female who presents today for health maintenance and complete physical. She feels well. She reports exercising daily. Raking, yard work, Catering manager. She reports she is sleeping fairly well.  Last mammogram- 08/23/2014- BI-RADS 1. Family H/O breast cancer in sister. Last colonoscopy- 09/28/2014- normal. Last pap- S/P hysterectomy at age 37. -----------------------------------------------------------------  Depression: Followed by Dr. Meryl Crutch (psychiatry).  Taking Wellbutrin XL 300mg  daily and Prozac 50 mg daily. States this is the best she has felt physically and emotionally in her entire life.   Review of Systems  Constitutional: Positive for activity change.  HENT: Positive for dental problem and ear pain.   Eyes: Positive for itching.  Respiratory: Negative.   Gastrointestinal: Negative.   Endocrine: Negative.   Genitourinary: Negative.   Musculoskeletal: Positive for arthralgias, back pain and neck stiffness.  Skin: Negative.   Allergic/Immunologic: Negative.   Hematological: Bruises/bleeds easily.  Psychiatric/Behavioral: Negative.   All other systems reviewed and are negative.   Social History      She  reports that she quit smoking about 6 years ago. Her smoking use included cigarettes. She has a 10.00 pack-year smoking history. she has never used smokeless tobacco. She reports that she does not drink alcohol or use drugs.       Social History   Socioeconomic History  . Marital status: Married    Spouse name: Brett Canales  . Number of children: 0  . Years of education: 16  . Highest education level: Bachelor's degree (e.g., BA, AB, BS)  Social Needs  . Financial resource strain: Not on file  . Food  insecurity - worry: Not on file  . Food insecurity - inability: Not on file  . Transportation needs - medical: Not on file  . Transportation needs - non-medical: Not on file  Occupational History  . Occupation: unemployed  Tobacco Use  . Smoking status: Former Smoker    Packs/day: 0.25    Years: 40.00    Pack years: 10.00    Types: Cigarettes    Last attempt to quit: 03/07/2011    Years since quitting: 6.0  . Smokeless tobacco: Never Used  Substance and Sexual Activity  . Alcohol use: No    Alcohol/week: 0.0 oz  . Drug use: No  . Sexual activity: Yes  Other Topics Concern  . Not on file  Social History Narrative   Divorced woman -- Now Remarried to her long-term partner.   Exercises routinely on a daily basis roughly 45 minutes a day walking.  Quit smoking at the time of her MI. Does not drink.    Past Medical History:  Diagnosis Date  . Anxiety   . Arthritis    knees & back   . CAD S/P percutaneous coronary angioplasty 03/07/2011   a) PCI of mRCA with Promus Element DES 3.5 mm x 28 mm (4.2 -- 3.7 mm); b) Myoview 04/2011: EF 81% (small LV Cavity), fixed basal-mid Inferior Infarct, No Ischemia, 10 METS  . Complication of anesthesia   . Depression 03/08/2011   Dr. Leory Plowman- [place], [facility]  . Essential hypertension 03/07/2011  . Family history of adverse reaction to anesthesia   . GERD (gastroesophageal reflux disease)   .  Headache    chocolate & red wine triggers /w bad headaches   . History of back surgery   . Hyperlipidemia with target LDL less than 70 2012    Statin Intolerance (tried Lipitor & Crestor)  . Insomnia    without significant sleeping disorders  . Iron deficiency anemia 03/09/2011  . Neuropathy   . PONV (postoperative nausea and vomiting)   . ST-segment elevation myocardial infarction (STEMI) of inferior wall (HCC) 03/07/2011   RCA Occlusion --> PCI of mRCA; ECHO 03/2011: EF > 55%, MIld Inferior HK, Mild AoV Sclerosis.  . Vocal cord polyp 2005      Patient Active Problem List   Diagnosis Date Noted  . Syncope and collapse 07/25/2016  . Hypocalcemia 09/29/2015  . Status post total bilateral knee replacement 07/20/2015  . Clinical depression 06/07/2015  . Allergic rhinitis 03/30/2015  . Absolute anemia 03/30/2015  . History of tobacco use 03/30/2015  . Adenopathy 03/30/2015  . Headache, migraine 03/30/2015  . Post menopausal syndrome 03/30/2015  . Allergic to bees 12/07/2014  . Overweight (BMI 25.0-29.9) 11/23/2012  . History of tobacco abuse: Quit in November 2012 11/23/2011  . Presence of stent in right coronary artery 03/11/2011  . Iron deficiency anemia 03/09/2011  . CAD S/P PCI mRCA: Promus Element DES 3.5 mm x 28 mm (4.2-37 mm)) 03/08/2011    Class: Diagnosis of  . Hyperlipidemia with target LDL less than 70; Statin Intolerant 03/08/2011  . Previous back surgery 03/08/2011  . Depression 03/08/2011  . ST elevation myocardial infarction (STEMI) of inferior wall, subsequent episode of care (HCC) 03/07/2011    Class: Acute  . Contagious disease 10/15/2009  . Encounter for general adult medical examination without abnormal findings 01/05/2009  . Benign essential HTN 01/05/2009  . Acid reflux 01/05/2009  . Narrowing of intervertebral disc space 12/14/2008  . Herniation of nucleus pulposus 12/14/2008    Past Surgical History:  Procedure Laterality Date  . ABDOMINAL HYSTERECTOMY    . APPENDECTOMY  07/04/2000   Normal appendix, adhesions noted.  Marland Kitchen BACK SURGERY     cyst on spinal cord - lumbar  . BREAST BIOPSY Right 01/31/1999   fibrocystic changes, ductal adenosis, focal atypical ductal epithelium.  Marland Kitchen CARDIAC CATHETERIZATION  03/08/2011   improved flow from the PCI  . CHOLECYSTECTOMY  03/25/1998   Dr. Lemar Livings, chronic cholecystitis.  . COLONOSCOPY WITH PROPOFOL N/A 09/28/2014   Procedure: COLONOSCOPY WITH PROPOFOL;  Surgeon: Earline Mayotte, MD;  Location: [facility] ENDOSCOPY;  Service: Endoscopy;  Laterality: N/A;   . CORONARY ANGIOPLASTY WITH STENT PLACEMENT  03/07/2011   inferior STEMI - RCA PROMUS 3.5 mm x 28 mm DES  (post Dil 3.7 distal to 4.2 prox)  . DENTAL SURGERY  2016  . DOPPLER ECHOCARDIOGRAPHY  03/27/2011   LV cavity is small,EF =>55%,MILD INFERIOR HYPOKINESIS; Mild Aortic Sclerosis  . ESOPHAGOGASTRODUODENOSCOPY N/A 09/28/2014   Procedure: ESOPHAGOGASTRODUODENOSCOPY (EGD);  Surgeon: Earline Mayotte, MD;  Location: [facility] ENDOSCOPY;  Service: Endoscopy;  Laterality: N/A;  . EYE SURGERY     blepheroplasty- bilateral  . JOINT REPLACEMENT    . KNEE SURGERY Left 2015  . KNEE SURGERY Right 2010   Baker cyst  . LEFT HEART CATHETERIZATION WITH CORONARY ANGIOGRAM N/A 03/07/2011   Procedure: LEFT HEART CATHETERIZATION WITH CORONARY ANGIOGRAM;  Surgeon: Marykay Lex, MD;  Location: [facility] CATH LAB;  Service: Cardiovascular;  Laterality: N/A;  . LEFT HEART CATHETERIZATION WITH CORONARY ANGIOGRAM N/A 03/08/2011   Procedure: LEFT HEART CATHETERIZATION WITH CORONARY ANGIOGRAM;  Surgeon: Marykay Lexavid W Harding, MD;  [facility];  Service: Cardiovascular;  Laterality: N/A;  . NM MYOCAR PERF WALL MOTION  08/2016   Lexiscan: LOW RISK.  No ischemia or infarction.  Hyperdynamic LV function .  EF> 65%  . skin nodule Left 09/11/1999   dermatofibroma left lower leg, positive lateral margin.  Marland Kitchen. TOTAL KNEE ARTHROPLASTY Bilateral 07/20/2015   Procedure: TOTAL KNEE BILATERAL;  Surgeon: Loreta Aveaniel F Murphy, MD;  [facility];  Service: Orthopedics;  Laterality: Bilateral;  . UPPER GI ENDOSCOPY  02-22-03   Dr. Lemar LivingsByrnett, normal    Family History        Family Status  Relation Name Status  . Mother  Deceased  . Father  Deceased  . Sister  Alive  . Brother  Deceased  . Sister  Alive  . Sister  Alive        Her family history includes Alzheimer's disease in her father; Anxiety disorder in her mother; Bipolar disorder in her sister; Breast cancer (age of onset: 7870) in her sister; Coronary artery disease in her  mother; Depression in her brother and mother; Drug abuse in her brother; Healthy in her sister; Heart attack in her mother; Stroke in her father and mother.     Allergies  Allergen Reactions  . Bee Venom Anaphylaxis  . Statins Other (See Comments)    Myalgias & fatigue  . Etodolac   . Prednisone Palpitations and Other (See Comments)    Can take small amounts. HR increase; increased energy   . Sulfa Antibiotics Other (See Comments)    Elevated B/P, skin turns red     Current Outpatient Medications:  .  benazepril (LOTENSIN) 5 MG tablet, Take 1 tablet (5 mg total) by mouth daily before breakfast., Disp: 30 tablet, Rfl: 2 .  buPROPion (WELLBUTRIN XL) 300 MG 24 hr tablet, Take 1 tablet (300 mg total) by mouth every morning., Disp: 90 tablet, Rfl: 1 .  clopidogrel (PLAVIX) 75 MG tablet, Take 1 tablet (75 mg total) by mouth daily., Disp: 30 tablet, Rfl: 3 .  Evolocumab (REPATHA SURECLICK) 140 MG/ML SOAJ, Inject 1 pen into the skin every 14 (fourteen) days., Disp: 2 pen, Rfl: 5 .  FLUoxetine (PROZAC) 10 MG capsule, Take 1 capsule (10 mg total) by mouth daily., Disp: 90 capsule, Rfl: 1 .  FLUoxetine (PROZAC) 40 MG capsule, Take 1 capsule (40 mg total) by mouth daily before breakfast., Disp: 90 capsule, Rfl: 1 .  gabapentin (NEURONTIN) 300 MG capsule, Take 2 capsules (600 mg total) by mouth at bedtime., Disp: 30 capsule, Rfl: 0 .  metoprolol tartrate (LOPRESSOR) 50 MG tablet, Take 1 tablet (50 mg total) by mouth 2 (two) times daily., Disp: 180 tablet, Rfl: 3 .  nitroGLYCERIN (NITROSTAT) 0.4 MG SL tablet, Place 1 tablet (0.4 mg total) under the tongue every 5 (five) minutes as needed for chest pain (up to 3 tabs in 15 mins and then call 911)., Disp: 25 tablet, Rfl: 1 .  omeprazole (PRILOSEC) 20 MG capsule, TAKE ONE CAPSULE BY MOUTH DAILY, Disp: 90 capsule, Rfl: 0   Patient Care Team: Maryella ShiversPollak, Adriana M, PA-C as PCP - General (Physician Assistant) Lorie PhenixMaloney, Nancy, MD as Referring Physician (Family  Medicine) Earline MayotteByrnett, Jeffrey W, MD (General Surgery)      Objective:   Vitals: BP 124/84 (BP Location: Left Arm, Patient Position: Sitting, Cuff Size: Large)   Pulse 68   Temp 98.3 F (36.8 C) (Oral)   Resp 16   Ht  5\' 7"  (1.702 m)   Wt 170 lb (77.1 kg)   BMI 26.63 kg/m    Vitals:   03/11/17 1012  BP: 124/84  Pulse: 68  Resp: 16  Temp: 98.3 F (36.8 C)  TempSrc: Oral  Weight: 170 lb (77.1 kg)  Height: 5\' 7"  (1.702 m)     Physical Exam  Constitutional: She is oriented to person, place, and time. She appears well-developed and well-nourished. No distress.  HENT:  Head: Normocephalic and atraumatic.  Right Ear: External ear normal.  Left Ear: External ear normal.  Nose: Nose normal.  Mouth/Throat: Oropharynx is clear and moist.  Eyes: Conjunctivae and EOM are normal. Pupils are equal, round, and reactive to light. No scleral icterus.  Neck: Neck supple. No thyromegaly present.  Cardiovascular: Normal rate, regular rhythm, normal heart sounds and intact distal pulses.  No murmur heard. Pulmonary/Chest: Effort normal and breath sounds normal. No respiratory distress. She has no wheezes. She has no rales.  Breasts: breasts appear normal, no suspicious masses, no skin or nipple changes or axillary nodes.  Abdominal: Soft. Bowel sounds are normal. She exhibits no distension. There is no tenderness. There is no rebound and no guarding.  Musculoskeletal: She exhibits no edema or deformity.  Lymphadenopathy:    She has no cervical adenopathy.  Neurological: She is alert and oriented to person, place, and time.  Skin: Skin is warm and dry. No rash noted.  Psychiatric: She has a normal mood and affect. Her behavior is normal.  Vitals reviewed.    Depression Screen PHQ 2/9 Scores 03/11/2017 10/18/2014  PHQ - 2 Score 0 4  PHQ- 9 Score - 14      Assessment & Plan:     Routine Health Maintenance and Physical Exam  Exercise Activities and Dietary recommendations Goals     None      Immunization History  Administered Date(s) Administered  . Influenza Split 03/08/2011  . Pneumococcal Polysaccharide-23 03/08/2011    Health Maintenance  Topic Date Due  . Hepatitis C Screening  18-Aug-1955  . TETANUS/TDAP  05/16/1974  . PAP SMEAR  05/16/1976  . MAMMOGRAM  08/12/2016  . INFLUENZA VACCINE  11/07/2016  . COLONOSCOPY  09/27/2024  . HIV Screening  Completed     Discussed health benefits of physical activity, and encouraged her to engage in regular exercise appropriate for her age and condition.    Problem List Items Addressed This Visit      Cardiovascular and Mediastinum   Benign essential HTN (Chronic)    Well controlled Continue current meds Recent labs noted Managed by cardiology        Other   Hyperlipidemia with target LDL less than 70; Statin Intolerant (Chronic)    Well controlled Continue current meds Recent labs noted Managed by cardiology      Depression (Chronic)    Well controlled Continue psych f/u and current meds      Presence of stent in right coronary artery (Chronic)    Continue plavix Managed by cardiology      Overweight (BMI 25.0-29.9) (Chronic)    Discussed healthy weight, diet and exercise      H/O iron deficiency anemia    On plavix therapy Recent CBC reviewed with no anemia      History of tobacco use    Quit in 2012 Continue to abstain       Other Visit Diagnoses    Encounter for annual physical exam    -  Primary  Need for hepatitis C screening test       Relevant Orders   Hepatitis C Antibody   Encounter for screening mammogram for breast cancer       Relevant Orders   MM SCREENING BREAST TOMO BILATERAL   Need for diphtheria-tetanus-pertussis (Tdap) vaccine       Relevant Orders   Tdap vaccine greater than or equal to 7yo IM (Completed)      -------------------------------------------------------------------- Return in about 1 year (around 03/11/2018) for physical.   The entirety  of the information documented in the History of Present Illness, Review of Systems and Physical Exam were personally obtained by me. Portions of this information were initially documented by Irving Burton Ratchford, CMA and reviewed by me for thoroughness and accuracy.     Shirlee Latch, MD  [facility]

## 2017-03-11 NOTE — Assessment & Plan Note (Signed)
Well controlled Continue current meds Recent labs noted Managed by cardiology

## 2017-03-11 NOTE — Assessment & Plan Note (Signed)
Well controlled Continue psych f/u and current meds

## 2017-03-11 NOTE — Assessment & Plan Note (Signed)
Well controlled Continue current meds Recent labs noted Managed by cardiology 

## 2017-03-11 NOTE — Assessment & Plan Note (Signed)
Continue plavix Managed by cardiology

## 2017-03-11 NOTE — Patient Instructions (Signed)
Preventive Care 40-64 Years, Female Preventive care refers to lifestyle choices and visits with your health care provider that can promote health and wellness. What does preventive care include?  A yearly physical exam. This is also called an annual well check.  Dental exams once or twice a year.  Routine eye exams. Ask your health care provider how often you should have your eyes checked.  Personal lifestyle choices, including: ? Daily care of your teeth and gums. ? Regular physical activity. ? Eating a healthy diet. ? Avoiding tobacco and drug use. ? Limiting alcohol use. ? Practicing safe sex. ? Taking low-dose aspirin daily starting at age 86. ? Taking vitamin and mineral supplements as recommended by your health care provider. What happens during an annual well check? The services and screenings done by your health care provider during your annual well check will depend on your age, overall health, lifestyle risk factors, and family history of disease. Counseling Your health care provider may ask you questions about your:  Alcohol use.  Tobacco use.  Drug use.  Emotional well-being.  Home and relationship well-being.  Sexual activity.  Eating habits.  Work and work Statistician.  Method of birth control.  Menstrual cycle.  Pregnancy history.  Screening You may have the following tests or measurements:  Height, weight, and BMI.  Blood pressure.  Lipid and cholesterol levels. These may be checked every 5 years, or more frequently if you are over 31 years old.  Skin check.  Lung cancer screening. You may have this screening every year starting at age 62 if you have a 30-pack-year history of smoking and currently smoke or have quit within the past 15 years.  Fecal occult blood test (FOBT) of the stool. You may have this test every year starting at age 36.  Flexible sigmoidoscopy or colonoscopy. You may have a sigmoidoscopy every 5 years or a colonoscopy  every 10 years starting at age 57.  Hepatitis C blood test.  Hepatitis B blood test.  Sexually transmitted disease (STD) testing.  Diabetes screening. This is done by checking your blood sugar (glucose) after you have not eaten for a while (fasting). You may have this done every 1-3 years.  Mammogram. This may be done every 1-2 years. Talk to your health care provider about when you should start having regular mammograms. This may depend on whether you have a family history of breast cancer.  BRCA-related cancer screening. This may be done if you have a family history of breast, ovarian, tubal, or peritoneal cancers.  Pelvic exam and Pap test. This may be done every 3 years starting at age 65. Starting at age 66, this may be done every 5 years if you have a Pap test in combination with an HPV test.  Bone density scan. This is done to screen for osteoporosis. You may have this scan if you are at high risk for osteoporosis.  Discuss your test results, treatment options, and if necessary, the need for more tests with your health care provider. Vaccines Your health care provider may recommend certain vaccines, such as:  Influenza vaccine. This is recommended every year.  Tetanus, diphtheria, and acellular pertussis (Tdap, Td) vaccine. You may need a Td booster every 10 years.  Varicella vaccine. You may need this if you have not been vaccinated.  Zoster vaccine. You may need this after age 60.  Measles, mumps, and rubella (MMR) vaccine. You may need at least one dose of MMR if you were born in  1957 or later. You may also need a second dose.  Pneumococcal 13-valent conjugate (PCV13) vaccine. You may need this if you have certain conditions and were not previously vaccinated.  Pneumococcal polysaccharide (PPSV23) vaccine. You may need one or two doses if you smoke cigarettes or if you have certain conditions.  Meningococcal vaccine. You may need this if you have certain  conditions.  Hepatitis A vaccine. You may need this if you have certain conditions or if you travel or work in places where you may be exposed to hepatitis A.  Hepatitis B vaccine. You may need this if you have certain conditions or if you travel or work in places where you may be exposed to hepatitis B.  Haemophilus influenzae type b (Hib) vaccine. You may need this if you have certain conditions.  Talk to your health care provider about which screenings and vaccines you need and how often you need them. This information is not intended to replace advice given to you by your health care provider. Make sure you discuss any questions you have with your health care provider. Document Released: 04/22/2015 Document Revised: 12/14/2015 Document Reviewed: 01/25/2015 Elsevier Interactive Patient Education  2017 [employer].

## 2017-03-11 NOTE — Assessment & Plan Note (Signed)
On plavix therapy Recent CBC reviewed with no anemia

## 2017-03-11 NOTE — Assessment & Plan Note (Signed)
Quit in 2012 Continue to abstain

## 2017-03-11 NOTE — Assessment & Plan Note (Signed)
Discussed healthy weight, diet and exercise 

## 2017-03-12 ENCOUNTER — Telehealth: Payer: Self-pay

## 2017-03-12 NOTE — Telephone Encounter (Signed)
-----   Message from Erasmo DownerAngela M Bacigalupo, MD sent at 03/12/2017  8:31 AM EST ----- Negative Hep C screening  Bacigalupo, Marzella SchleinAngela M, MD, MPH [facility] 03/12/2017 8:31 AM

## 2017-03-12 NOTE — Telephone Encounter (Signed)
Pt advised.

## 2017-03-27 ENCOUNTER — Other Ambulatory Visit: Payer: Self-pay | Admitting: Family Medicine

## 2017-03-27 DIAGNOSIS — M9979 Connective tissue and disc stenosis of intervertebral foramina of abdomen and other regions: Secondary | ICD-10-CM

## 2017-03-27 MED ORDER — GABAPENTIN 300 MG PO CAPS: 600.0000 mg | ORAL_CAPSULE | Freq: Every day | ORAL | 3 refills | Status: DC

## 2017-03-27 NOTE — Telephone Encounter (Signed)
Pt requesting a refill on the following [facility] [address] and would like it to be for a a 90-days supply.  Thanks CC  gabapentin (NEURONTIN) 300 MG capsule

## 2017-03-27 NOTE — Telephone Encounter (Signed)
Had CPE on 03/11/2017.

## 2017-03-28 ENCOUNTER — Other Ambulatory Visit: Payer: Self-pay

## 2017-03-28 DIAGNOSIS — M9979 Connective tissue and disc stenosis of intervertebral foramina of abdomen and other regions: Secondary | ICD-10-CM

## 2017-03-28 MED ORDER — GABAPENTIN 300 MG PO CAPS: 600.0000 mg | ORAL_CAPSULE | Freq: Every day | ORAL | 3 refills | Status: DC

## 2017-03-28 NOTE — Telephone Encounter (Signed)
Pt states pharmacy still did not receive prescription. Resent.

## 2017-04-05 IMAGING — MG MM DIGITAL SCREENING BILAT W/ CAD
5 series · 5 of 5 positions shown · non-contrast
Comparison: Previous exam(s).

CLINICAL DATA: Screening.

EXAM:
DIGITAL SCREENING BILATERAL MAMMOGRAM WITH CAD

[R MLO]
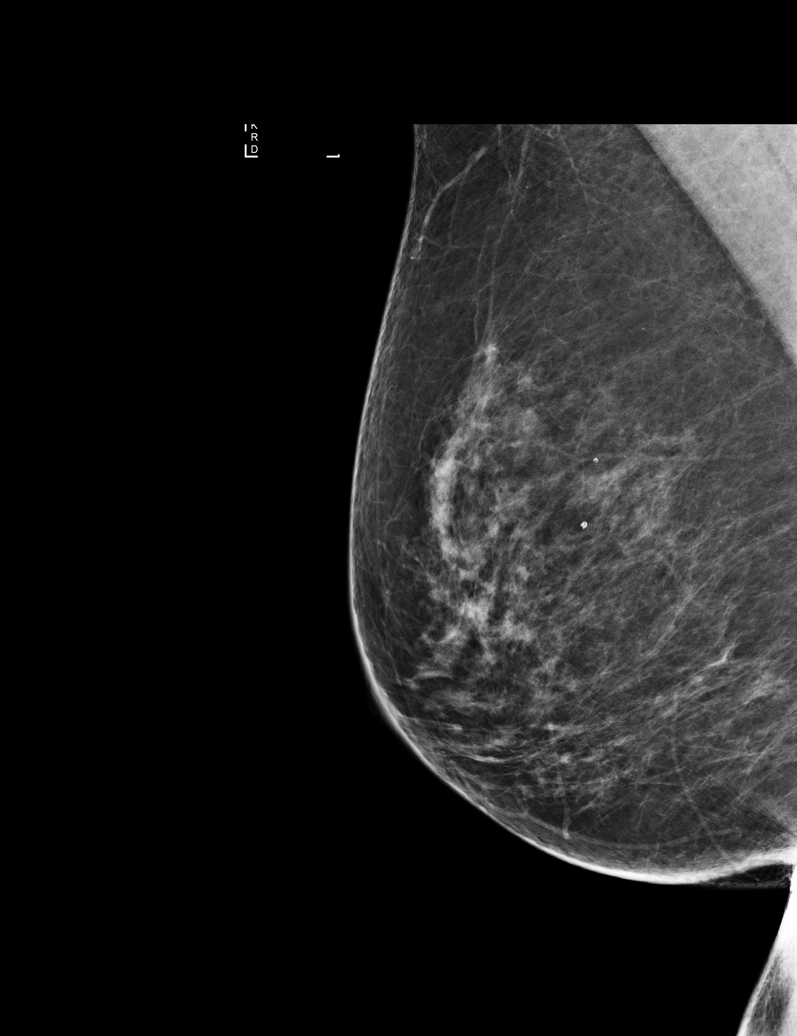

[R XCCL]
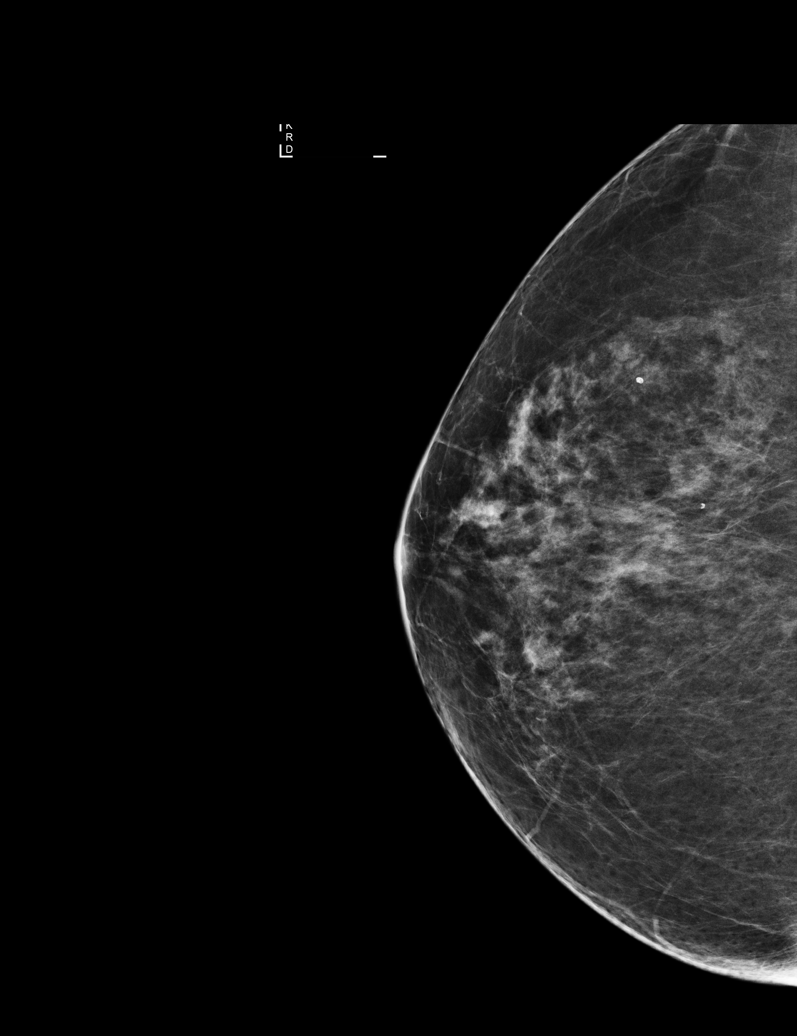

[L CC]
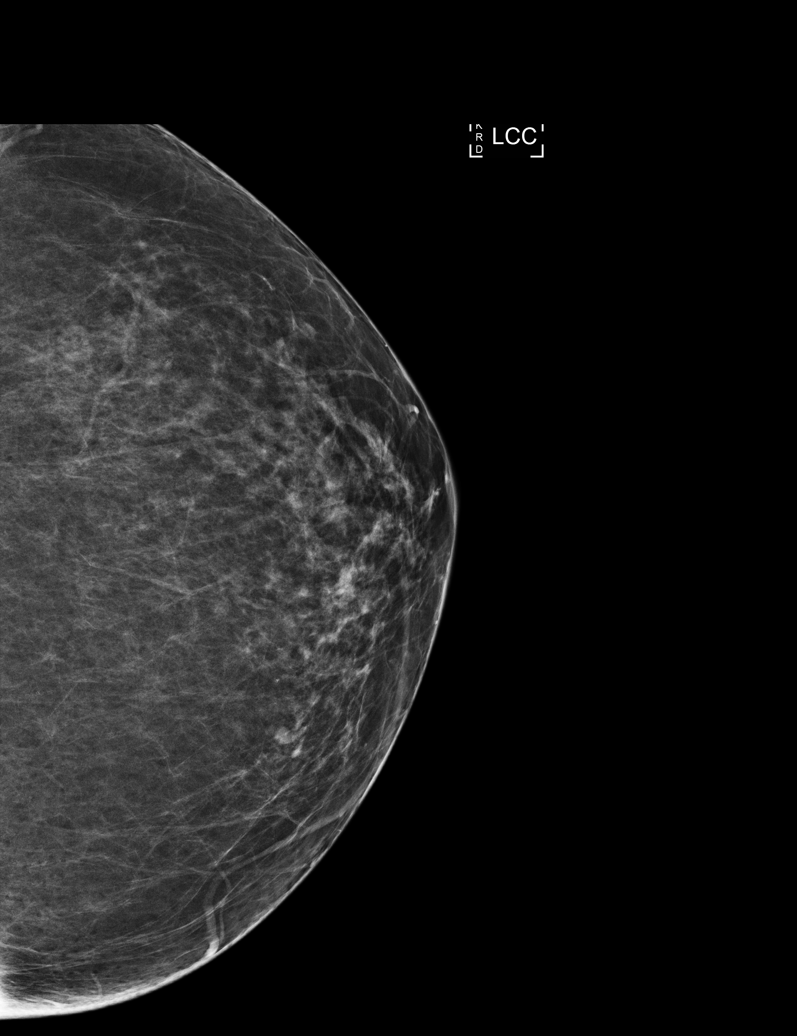

[L MLO]
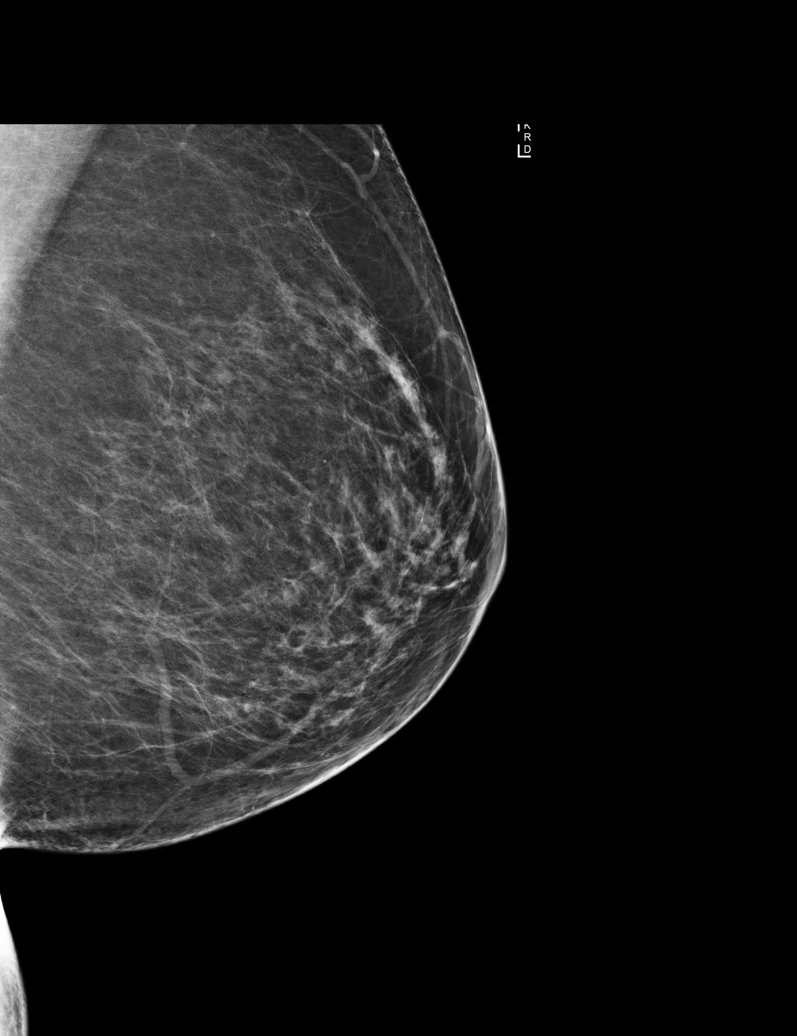

[R CC]
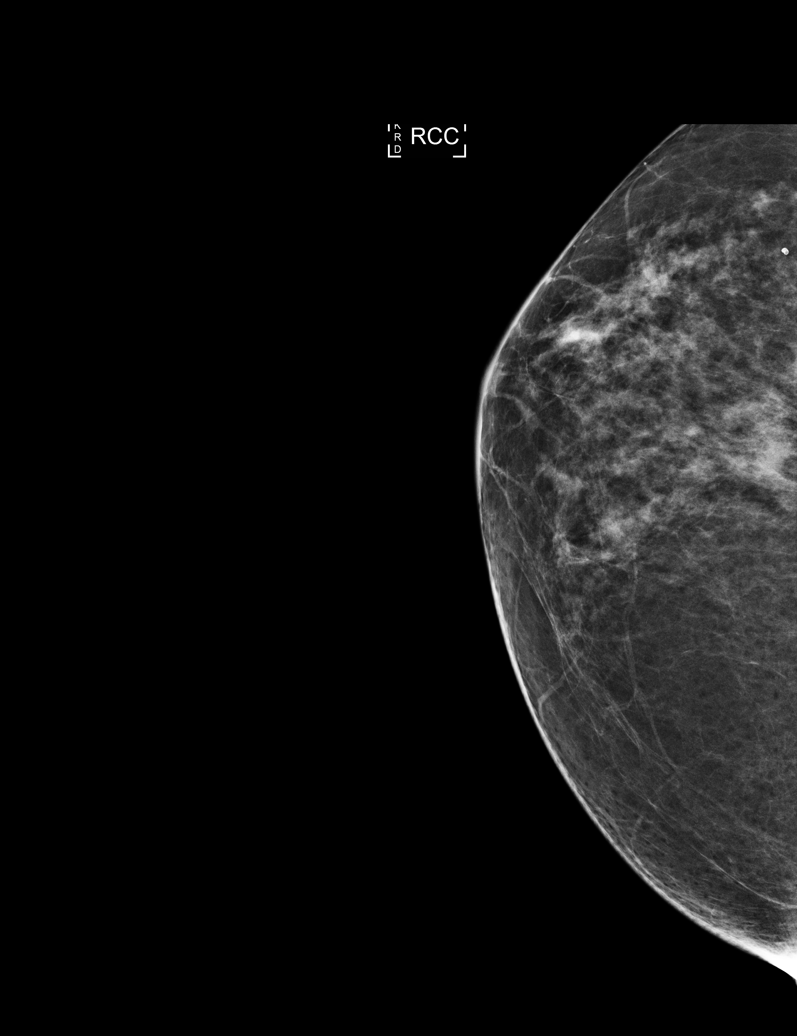

[5 of 5 positions shown; findings below may reference images not displayed]

ACR Breast Density Category b: There are scattered areas of
fibroglandular density.
FINDINGS: There are no findings suspicious for malignancy. Images were
processed with CAD.
IMPRESSION: No mammographic evidence of malignancy. A result letter of this
screening mammogram will be mailed directly to the patient.

RECOMMENDATION:
Screening mammogram in one year. (Code:AS-G-LCT)

BI-RADS CATEGORY  1: Negative.

## 2017-04-11 ENCOUNTER — Encounter: Payer: Self-pay | Admitting: Physician Assistant

## 2017-04-11 ENCOUNTER — Ambulatory Visit: Payer: BLUE CROSS/BLUE SHIELD | Admitting: Physician Assistant

## 2017-04-11 VITALS — BP 128/70 | HR 64 | Temp 98.6°F | Resp 16 | Wt 171.0 lb

## 2017-04-11 DIAGNOSIS — J029 Acute pharyngitis, unspecified: Secondary | ICD-10-CM | POA: Diagnosis not present

## 2017-04-11 LAB — POCT RAPID STREP A (OFFICE): Rapid Strep A Screen: NEGATIVE

## 2017-04-11 NOTE — Patient Instructions (Signed)

## 2017-04-11 NOTE — Progress Notes (Signed)
Patient: Jessica BrookingSarah A Landry Female    DOB: 01/12/1956   62 y.o.   MRN: [id number] Visit Date: 04/11/2017  Today's Provider: Trey SailorsAdriana M Pollak, PA-C   Chief Complaint  Patient presents with  . Sore Throat    Started Tuesday evening.   Subjective:    Sore Throat   This is a new problem. The current episode started in the past 7 days. The problem has been gradually worsening. Neither side of throat is experiencing more pain than the other. There has been no fever. Associated symptoms include congestion, coughing, headaches and trouble swallowing. Pertinent negatives include no ear discharge, ear pain, shortness of breath or stridor.       Allergies  Allergen Reactions  . Bee Venom Anaphylaxis  . Statins Other (See Comments)    Myalgias & fatigue  . Etodolac   . Prednisone Palpitations and Other (See Comments)    Can take small amounts. HR increase; increased energy   . Sulfa Antibiotics Other (See Comments)    Elevated B/P, skin turns red     Current Outpatient Medications:  .  benazepril (LOTENSIN) 5 MG tablet, Take 1 tablet (5 mg total) by mouth daily before breakfast., Disp: 30 tablet, Rfl: 2 .  buPROPion (WELLBUTRIN XL) 300 MG 24 hr tablet, Take 1 tablet (300 mg total) by mouth every morning., Disp: 90 tablet, Rfl: 1 .  clopidogrel (PLAVIX) 75 MG tablet, Take 1 tablet (75 mg total) by mouth daily., Disp: 30 tablet, Rfl: 3 .  Evolocumab (REPATHA SURECLICK) 140 MG/ML SOAJ, Inject 1 pen into the skin every 14 (fourteen) days., Disp: 2 pen, Rfl: 5 .  FLUoxetine (PROZAC) 10 MG capsule, Take 1 capsule (10 mg total) by mouth daily., Disp: 90 capsule, Rfl: 1 .  FLUoxetine (PROZAC) 40 MG capsule, Take 1 capsule (40 mg total) by mouth daily before breakfast., Disp: 90 capsule, Rfl: 1 .  gabapentin (NEURONTIN) 300 MG capsule, Take 2 capsules (600 mg total) by mouth at bedtime., Disp: 180 capsule, Rfl: 3 .  metoprolol tartrate (LOPRESSOR) 50 MG tablet, Take 1 tablet (50 mg total) by  mouth 2 (two) times daily., Disp: 180 tablet, Rfl: 3 .  nitroGLYCERIN (NITROSTAT) 0.4 MG SL tablet, Place 1 tablet (0.4 mg total) under the tongue every 5 (five) minutes as needed for chest pain (up to 3 tabs in 15 mins and then call 911)., Disp: 25 tablet, Rfl: 1 .  omeprazole (PRILOSEC) 20 MG capsule, TAKE ONE CAPSULE BY MOUTH DAILY, Disp: 90 capsule, Rfl: 0  Review of Systems  Constitutional: Positive for fatigue. Negative for activity change, appetite change, chills, diaphoresis, fever and unexpected weight change.  HENT: Positive for congestion, sore throat, trouble swallowing and voice change. Negative for ear discharge, ear pain, hearing loss, nosebleeds, postnasal drip, rhinorrhea, sinus pressure, sinus pain and tinnitus.   Respiratory: Positive for cough. Negative for apnea, choking, chest tightness, shortness of breath, wheezing and stridor.   Gastrointestinal: Negative.   Neurological: Positive for headaches. Negative for dizziness and light-headedness.    Social History   Tobacco Use  . Smoking status: Former Smoker    Packs/day: 0.25    Years: 40.00    Pack years: 10.00    Types: Cigarettes    Last attempt to quit: 03/07/2011    Years since quitting: 6.1  . Smokeless tobacco: Never Used  Substance Use Topics  . Alcohol use: No    Alcohol/week: 0.0 oz   Objective:   BP  128/70 (BP Location: Right Arm, Patient Position: Sitting, Cuff Size: Normal)   Pulse 64   Temp 98.6 F (37 C) (Oral)   Resp 16   Wt 171 lb (77.6 kg)   BMI 26.78 kg/m  Vitals:   04/11/17 1053  BP: 128/70  Pulse: 64  Resp: 16  Temp: 98.6 F (37 C)  TempSrc: Oral  Weight: 171 lb (77.6 kg)     Physical Exam  Constitutional: She is oriented to person, place, and time. She appears well-developed and well-nourished.  HENT:  Right Ear: Tympanic membrane normal.  Left Ear: Tympanic membrane normal.  Mouth/Throat: Posterior oropharyngeal edema and posterior oropharyngeal erythema present. No  oropharyngeal exudate.  Eyes: Conjunctivae are normal.  Cardiovascular: Normal rate and regular rhythm.  Pulmonary/Chest: Effort normal and breath sounds normal.  Lymphadenopathy:    She has cervical adenopathy.  Neurological: She is alert and oriented to person, place, and time.  Skin: Skin is warm and dry.  Psychiatric: She has a normal mood and affect. Her behavior is normal.        Assessment & Plan:     1. Viral pharyngitis  Strep negative in office, likely viral. Unfortunately with heart history not a great candidate for NSAIDs. Tylenol and warm saltwater gargles for symptomatic relief, should resolve in several days.  2. Sore throat  - POCT rapid strep A  Return if symptoms worsen or fail to improve.  The entirety of the information documented in the History of Present Illness, Review of Systems and Physical Exam were personally obtained by me. Portions of this information were initially documented by Kavin Leech, CMA and reviewed by me for thoroughness and accuracy.        Trey Sailors, PA-C  [facility]

## 2017-04-15 ENCOUNTER — Encounter: Payer: Self-pay | Admitting: Family Medicine

## 2017-04-15 ENCOUNTER — Ambulatory Visit: Payer: BLUE CROSS/BLUE SHIELD | Admitting: Family Medicine

## 2017-04-15 VITALS — BP 142/92 | HR 66 | Temp 98.5°F | Resp 15 | Wt 169.6 lb

## 2017-04-15 DIAGNOSIS — H1033 Unspecified acute conjunctivitis, bilateral: Secondary | ICD-10-CM

## 2017-04-15 DIAGNOSIS — J0100 Acute maxillary sinusitis, unspecified: Secondary | ICD-10-CM | POA: Diagnosis not present

## 2017-04-15 MED ORDER — AMOXICILLIN-POT CLAVULANATE 875-125 MG PO TABS: 1.0000 | ORAL_TABLET | Freq: Two times a day (BID) | ORAL | 0 refills | Status: DC

## 2017-04-15 MED ORDER — HYDROCODONE-HOMATROPINE 5-1.5 MG/5ML PO SYRP: ORAL_SOLUTION | ORAL | 0 refills | Status: DC

## 2017-04-15 MED ORDER — POLYMYXIN B-TRIMETHOPRIM 10000-0.1 UNIT/ML-% OP SOLN: OPHTHALMIC | 0 refills | Status: DC

## 2017-04-15 NOTE — Patient Instructions (Signed)
Discussed use of frequent of wet, warm compresses to both eyes.

## 2017-04-15 NOTE — Progress Notes (Signed)
Subjective:     Patient ID: Jessica Landry, female   DOB: 09/15/1955, 62 y.o.   MRN: [id number] Chief Complaint  Patient presents with  . Sore Throat    Patient returns back to office today for follow up, patient was last seen 04/11/17 and diagnosed with viral pharyngitis and strep in office was negative. Patient states that she has had no changes in her symptoms and since weekend has had crusting, redness and itching of both eyes, patient states in the PM she has been a fever high of 100. Patient has tried otc Tylenol for symptom control.    HPI She two weeks out from onset of a URI. Patient reports increased sinus pressure, purulent  post nasal drainage and accompanying cough. Also has developed red eyes with matting over the last 3 days. She is accompanied by her husband.  Review of Systems     Objective:   Physical Exam  Constitutional: She appears well-developed and well-nourished. No distress.  Ears: T.M's intact without inflammation Eyes: scleral injected. No f.b.or draingage noted. Throat: tonsils absent with greenish posterior pharyngeal drainage. Neck: no cervical adenopathy Lungs: clear     Assessment:    1. Acute maxillary sinusitis, recurrence not specified - amoxicillin-clavulanate (AUGMENTIN) 875-125 MG tablet; Take 1 tablet by mouth 2 (two) times daily.  Dispense: 20 tablet; Refill: 0 - HYDROcodone-homatropine (HYCODAN) 5-1.5 MG/5ML syrup; 5 ml 4-6 hours as needed for cough  Dispense: 120 mL; Refill: 0  2. Acute bacterial conjunctivitis of both eyes - trimethoprim-polymyxin b (POLYTRIM) ophthalmic solution; 1-2 drops every 4-6 hours (max 6 drops/day)  Dispense: 10 mL; Refill: 0    Plan:    Discussed frequent use of warm, wet, compresses to her eyes.

## 2017-04-23 ENCOUNTER — Ambulatory Visit: Payer: BLUE CROSS/BLUE SHIELD | Admitting: Psychiatry

## 2017-05-01 ENCOUNTER — Other Ambulatory Visit: Payer: Self-pay

## 2017-05-01 ENCOUNTER — Ambulatory Visit: Payer: BLUE CROSS/BLUE SHIELD | Admitting: Psychiatry

## 2017-05-01 ENCOUNTER — Encounter: Payer: Self-pay | Admitting: Psychiatry

## 2017-05-01 VITALS — BP 119/72 | HR 71 | Temp 98.6°F | Wt 170.6 lb

## 2017-05-01 DIAGNOSIS — Z634 Disappearance and death of family member: Secondary | ICD-10-CM

## 2017-05-01 DIAGNOSIS — F331 Major depressive disorder, recurrent, moderate: Secondary | ICD-10-CM

## 2017-05-01 MED ORDER — BUPROPION HCL ER (XL) 300 MG PO TB24: 300.0000 mg | ORAL_TABLET | ORAL | 1 refills | Status: DC

## 2017-05-01 MED ORDER — FLUOXETINE HCL 10 MG PO CAPS: 10.0000 mg | ORAL_CAPSULE | Freq: Every day | ORAL | 1 refills | Status: DC

## 2017-05-01 MED ORDER — FLUOXETINE HCL 40 MG PO CAPS: 40.0000 mg | ORAL_CAPSULE | Freq: Every day | ORAL | 1 refills | Status: DC

## 2017-05-01 NOTE — Progress Notes (Signed)
Patient ID: Jessica Landry, female   DOB: Nov 15, 1955, 62 y.o.   MRN: [id number]   [facility] MD/PA/NP OP Progress Note  05/01/2017 2:36 PM Jessica Landry  MRN:  [id number]  Subjective:  Patient returns for follow-up of her major depressive disorder, recurrent moderate.   She reports that she has been doing quite well. She has joined a support group at church for codependency issues. She had her first meeting last night. She reports it was uncomfortable but it went okay. Fair sleep and appetite. Denies any suicidal thoughts.  Chief Complaint: Doing well Chief Complaint    Follow-up; Medication Refill     Visit Diagnosis:     ICD-10-CM   1. Major depressive disorder, recurrent episode, moderate (HCC) F33.1   2. Bereavement Z63.4     Past Medical History:  Past Medical History:  Diagnosis Date  . Anxiety   . Arthritis    knees & back   . CAD S/P percutaneous coronary angioplasty 03/07/2011   a) PCI of mRCA with Promus Element DES 3.5 mm x 28 mm (4.2 -- 3.7 mm); b) Myoview 04/2011: EF 81% (small LV Cavity), fixed basal-mid Inferior Infarct, No Ischemia, 10 METS  . Complication of anesthesia   . Depression 03/08/2011   Dr. Leory Plowman- [place], [facility]  . Essential hypertension 03/07/2011  . Family history of adverse reaction to anesthesia   . GERD (gastroesophageal reflux disease)   . Headache    chocolate & red wine triggers /w bad headaches   . History of back surgery   . Hyperlipidemia with target LDL less than 70 2012    Statin Intolerance (tried Lipitor & Crestor)  . Insomnia    without significant sleeping disorders  . Iron deficiency anemia 03/09/2011  . Neuropathy   . PONV (postoperative nausea and vomiting)   . ST-segment elevation myocardial infarction (STEMI) of inferior wall (HCC) 03/07/2011   RCA Occlusion --> PCI of mRCA; ECHO 03/2011: EF > 55%, MIld Inferior HK, Mild AoV Sclerosis.  . Vocal cord polyp 2005    Past Surgical History:  Procedure Laterality Date  .  ABDOMINAL HYSTERECTOMY    . APPENDECTOMY  07/04/2000   Normal appendix, adhesions noted.  Marland Kitchen BACK SURGERY     cyst on spinal cord - lumbar  . BREAST BIOPSY Right 01/31/1999   fibrocystic changes, ductal adenosis, focal atypical ductal epithelium.  Marland Kitchen CARDIAC CATHETERIZATION  03/08/2011   improved flow from the PCI  . CHOLECYSTECTOMY  03/25/1998   Dr. Lemar Livings, chronic cholecystitis.  . COLONOSCOPY WITH PROPOFOL N/A 09/28/2014   Procedure: COLONOSCOPY WITH PROPOFOL;  Surgeon: Earline Mayotte, MD;  Location: [facility] ENDOSCOPY;  Service: Endoscopy;  Laterality: N/A;  . CORONARY ANGIOPLASTY WITH STENT PLACEMENT  03/07/2011   inferior STEMI - RCA PROMUS 3.5 mm x 28 mm DES  (post Dil 3.7 distal to 4.2 prox)  . DENTAL SURGERY  2016  . DOPPLER ECHOCARDIOGRAPHY  03/27/2011   LV cavity is small,EF =>55%,MILD INFERIOR HYPOKINESIS; Mild Aortic Sclerosis  . ESOPHAGOGASTRODUODENOSCOPY N/A 09/28/2014   Procedure: ESOPHAGOGASTRODUODENOSCOPY (EGD);  Surgeon: Earline Mayotte, MD;  Location: [facility] ENDOSCOPY;  Service: Endoscopy;  Laterality: N/A;  . EYE SURGERY     blepheroplasty- bilateral  . JOINT REPLACEMENT    . KNEE SURGERY Left 2015  . KNEE SURGERY Right 2010   Baker cyst  . LEFT HEART CATHETERIZATION WITH CORONARY ANGIOGRAM N/A 03/07/2011   Procedure: LEFT HEART CATHETERIZATION WITH CORONARY ANGIOGRAM;  Surgeon: Marykay Lex, MD;  Location: [facility]  CATH LAB;  Service: Cardiovascular;  Laterality: N/A;  . LEFT HEART CATHETERIZATION WITH CORONARY ANGIOGRAM N/A 03/08/2011   Procedure: LEFT HEART CATHETERIZATION WITH CORONARY ANGIOGRAM;  Surgeon: Marykay Lex, MD;  Location: [facility] CATH LAB;  Service: Cardiovascular;  Laterality: N/A;  . NM MYOCAR PERF WALL MOTION  08/2016   Lexiscan: LOW RISK.  No ischemia or infarction.  Hyperdynamic LV function .  EF> 65%  . skin nodule Left 09/11/1999   dermatofibroma left lower leg, positive lateral margin.  Marland Kitchen TOTAL KNEE ARTHROPLASTY Bilateral 07/20/2015   Procedure:  TOTAL KNEE BILATERAL;  Surgeon: Loreta Ave, MD;  Location: [facility] OR;  Service: Orthopedics;  Laterality: Bilateral;  . UPPER GI ENDOSCOPY  02-22-03   Dr. Lemar Livings, normal   Family History:  Family History  Problem Relation Age of Onset  . Heart attack Mother   . Coronary artery disease Mother   . Stroke Mother   . Depression Mother   . Anxiety disorder Mother   . Alzheimer's disease Father   . Stroke Father   . Breast cancer Sister 66  . Depression Brother   . Drug abuse Brother   . Healthy Sister   . Bipolar disorder Sister    Social History:  Social History   Socioeconomic History  . Marital status: Married    Spouse name: Brett Canales  . Number of children: 0  . Years of education: 16  . Highest education level: Bachelor's degree (e.g., BA, AB, BS)  Social Needs  . Financial resource strain: None  . Food insecurity - worry: None  . Food insecurity - inability: None  . Transportation needs - medical: None  . Transportation needs - non-medical: None  Occupational History  . Occupation: unemployed  Tobacco Use  . Smoking status: Former Smoker    Packs/day: 0.25    Years: 40.00    Pack years: 10.00    Types: Cigarettes    Last attempt to quit: 03/07/2011    Years since quitting: 6.1  . Smokeless tobacco: Never Used  Substance and Sexual Activity  . Alcohol use: No    Alcohol/week: 0.0 oz  . Drug use: No  . Sexual activity: Yes  Other Topics Concern  . None  Social History Narrative   Divorced woman -- Now Remarried to her long-term partner.   Exercises routinely on a daily basis roughly 45 minutes a day walking.  Quit smoking at the time of her MI. Does not drink.   Additional History:   Assessment:   Musculoskeletal: Strength & Muscle Tone: within normal limits Gait & Station: normal Patient leans: N/A  Psychiatric Specialty Exam: Medication Refill   Depression         Associated symptoms include does not have insomnia and no suicidal ideas.   Review  of Systems  Psychiatric/Behavioral: Negative for depression, hallucinations, memory loss, substance abuse and suicidal ideas. The patient is not nervous/anxious and does not have insomnia.   All other systems reviewed and are negative.   Blood pressure 119/72, pulse 71, temperature 98.6 F (37 C), temperature source Oral, weight 77.4 kg (170 lb 9.6 oz).Body mass index is 26.72 kg/m.  General Appearance: Neat and Well Groomed  Eye Contact:  Good  Speech:  Normal Rate  Volume:  Normal  Mood:  Doing well  Affect:  Smiling   Thought Process:  Linear  Orientation:  Full (Time, Place, and Person)  Thought Content:  Negative  Suicidal Thoughts:  No  Homicidal Thoughts:  No  Memory:  Immediate;   Good Recent;   Good Remote;   Good  Judgement:  Good  Insight:  Good  Psychomotor Activity:  Negative  Concentration:  Good  Recall:  Good  Fund of Knowledge: Negative  Language: Good  Akathisia:  Negative  Handed:  Right unknown   AIMS (if indicated):  NA  Assets:  Communication Skills Desire for Improvement  ADL's:  Intact  Cognition: WNL  Sleep:  good   Is the patient at risk to self?  No. Has the patient been a risk to self in the past 6 months?  No. Has the patient been a risk to self within the distant past?  No. Is the patient a risk to others?  No. Has the patient been a risk to others in the past 6 months?  No. Has the patient been a risk to others within the distant past?  No.  Current Medications: Current Outpatient Medications  Medication Sig Dispense Refill  . benazepril (LOTENSIN) 5 MG tablet Take 1 tablet (5 mg total) by mouth daily before breakfast. 30 tablet 2  . buPROPion (WELLBUTRIN XL) 300 MG 24 hr tablet Take 1 tablet (300 mg total) by mouth every morning. 90 tablet 1  . clopidogrel (PLAVIX) 75 MG tablet Take 1 tablet (75 mg total) by mouth daily. 30 tablet 3  . Evolocumab (REPATHA SURECLICK) 140 MG/ML SOAJ Inject 1 pen into the skin every 14 (fourteen) days. 2  pen 5  . FLUoxetine (PROZAC) 10 MG capsule Take 1 capsule (10 mg total) by mouth daily. 90 capsule 1  . FLUoxetine (PROZAC) 40 MG capsule Take 1 capsule (40 mg total) by mouth daily before breakfast. 90 capsule 1  . gabapentin (NEURONTIN) 300 MG capsule Take 2 capsules (600 mg total) by mouth at bedtime. 180 capsule 3  . HYDROcodone-homatropine (HYCODAN) 5-1.5 MG/5ML syrup 5 ml 4-6 hours as needed for cough 120 mL 0  . metoprolol tartrate (LOPRESSOR) 50 MG tablet Take 1 tablet (50 mg total) by mouth 2 (two) times daily. 180 tablet 3  . nitroGLYCERIN (NITROSTAT) 0.4 MG SL tablet Place 1 tablet (0.4 mg total) under the tongue every 5 (five) minutes as needed for chest pain (up to 3 tabs in 15 mins and then call 911). 25 tablet 1  . omeprazole (PRILOSEC) 20 MG capsule TAKE ONE CAPSULE BY MOUTH DAILY 90 capsule 0  . trimethoprim-polymyxin b (POLYTRIM) ophthalmic solution 1-2 drops every 4-6 hours (max 6 drops/day) 10 mL 0  . amoxicillin-clavulanate (AUGMENTIN) 875-125 MG tablet Take 1 tablet by mouth 2 (two) times daily. (Patient not taking: Reported on 05/01/2017) 20 tablet 0   No current facility-administered medications for this visit.       Medical Decision Making:  Established Problem, Stable/Improving (1) and Review of Medication Regimen & Side Effects (2)  Treatment Plan Summary:Medication management and Plan   Major depressive disorder, recurrent, moderate- We will continue Wellbutrin XL 300 mg daily. Continue Prozac at 50mg .   Insomnia Resolved  Patient will follow up in 3 months.  Jessica Landry 05/01/2017, 2:36 PM

## 2017-05-10 ENCOUNTER — Other Ambulatory Visit: Payer: Self-pay | Admitting: Cardiology

## 2017-05-15 NOTE — Telephone Encounter (Signed)
I would like to approve this, I just simply have no idea how to write the prescription.  Bryan Lemmaavid Harding, MD

## 2017-05-16 MED ORDER — EVOLOCUMAB 140 MG/ML ~~LOC~~ SOAJ: 140.0000 mg | SUBCUTANEOUS | 11 refills | Status: DC

## 2017-05-16 NOTE — Addendum Note (Signed)
Addended by: Pearletha FurlODRIGUEZ-GUZMAN, Adamarys Shall on: 05/16/2017 03:38 PM   Modules accepted: Orders

## 2017-05-16 NOTE — Telephone Encounter (Signed)
REFILL 

## 2017-05-16 NOTE — Telephone Encounter (Signed)
New rx sent to local [facility]

## 2017-05-17 ENCOUNTER — Telehealth: Payer: Self-pay | Admitting: Cardiology

## 2017-05-17 NOTE — Telephone Encounter (Signed)
New Message    *STAT* If patient is at the pharmacy, call can be transferred to refill team.   1. Which medications need to be refilled? (please list name of each medication and dose if known) Evolocumab (REPATHA SURECLICK) 140 MG/ML SOAJ  2. Which pharmacy/location (including street and city if local pharmacy) is medication to be sent to? [facility] [address] [place]  3. Do they need a 30 day or 90 day supply? 30

## 2017-05-21 MED ORDER — EVOLOCUMAB 140 MG/ML ~~LOC~~ SOAJ: 140.0000 mg | SUBCUTANEOUS | 11 refills | Status: DC

## 2017-05-21 NOTE — Telephone Encounter (Signed)
Will forward to [facility].  DH

## 2017-05-21 NOTE — Telephone Encounter (Signed)
Prescription sent

## 2017-05-24 ENCOUNTER — Ambulatory Visit: Payer: Self-pay | Admitting: Family Medicine

## 2017-05-24 ENCOUNTER — Encounter: Payer: Self-pay | Admitting: Family Medicine

## 2017-05-24 VITALS — BP 130/70 | HR 51 | Temp 97.6°F | Resp 16 | Wt 173.4 lb

## 2017-05-24 DIAGNOSIS — J069 Acute upper respiratory infection, unspecified: Secondary | ICD-10-CM

## 2017-05-24 NOTE — Progress Notes (Signed)
Subjective:     Patient ID: Jessica Landry, female   DOB: 11/29/1955, 62 y.o.   MRN: [id number] Chief Complaint  Patient presents with  . Sore Throat    Patient comes in office today with complaints of right ear pain, sore throat and cough and congestion for the oast 24hrs.    HPI Reports lingering sx from o.v.for sinusitis in early January. States she has a lot of hydrocodone cough syrup left over.  Review of Systems     Objective:   Physical Exam  Constitutional: She appears well-developed and well-nourished. No distress.  Ears: T.M's intact without inflammation Throat: Tonsils absent with posterior pharyngeal erythema Neck: no cervical adenopathy Lungs: clear     Assessment:    1. Viral upper respiratory tract infection    Plan:    Discussed use of Mucinex D and natual history of her illness.

## 2017-05-24 NOTE — Patient Instructions (Addendum)
Discussed use of decongestants like Mucinex D and prescription cough syrup. Call if sinuses not improving towards the end of next week.

## 2017-06-12 ENCOUNTER — Other Ambulatory Visit: Payer: Self-pay

## 2017-06-12 DIAGNOSIS — K219 Gastro-esophageal reflux disease without esophagitis: Secondary | ICD-10-CM

## 2017-06-12 NOTE — Telephone Encounter (Signed)
Pt needs refill of omeprazole sent to [facility] [address]

## 2017-06-13 MED ORDER — OMEPRAZOLE 20 MG PO CPDR: 20.0000 mg | DELAYED_RELEASE_CAPSULE | Freq: Every day | ORAL | 2 refills | Status: DC

## 2017-06-13 NOTE — Telephone Encounter (Signed)
Please review

## 2017-07-04 ENCOUNTER — Telehealth: Payer: Self-pay | Admitting: Cardiology

## 2017-07-04 MED ORDER — BENAZEPRIL HCL 5 MG PO TABS: 5.0000 mg | ORAL_TABLET | Freq: Every day | ORAL | 2 refills | Status: DC

## 2017-07-04 MED ORDER — CLOPIDOGREL BISULFATE 75 MG PO TABS: 75.0000 mg | ORAL_TABLET | Freq: Every day | ORAL | 2 refills | Status: DC

## 2017-07-04 NOTE — Telephone Encounter (Signed)
This is Dr. Harding's pt. °

## 2017-07-04 NOTE — Telephone Encounter (Signed)
New message   Needs new script sent over, she is not longer using mailorder    *STAT* If patient is at the pharmacy, call can be transferred to refill team.   1. Which medications need to be refilled? (please list name of each medication and dose if known)    benazepril (LOTENSIN) 5 MG tablet Take 1 tablet (5 mg total) by mouth daily before breakfast.     clopidogrel (PLAVIX) 75 MG tablet Take 1 tablet (75 mg total) by mouth daily.     2. Which pharmacy/location (including street and city if local pharmacy) is medication to be sent to? [facility] [address] [place]  3. Do they need a 30 day or 90 day supply?  90

## 2017-07-25 ENCOUNTER — Telehealth: Payer: Self-pay

## 2017-07-25 NOTE — Telephone Encounter (Signed)
pt called states that she needs a [facility] for [facility] bup[facility]ion pt states that [facility] they transfered the other but not that one.     buPROPion (WELLBUTRIN XL) 300 MG 24 hr tablet 90 tablet 1 05/01/2017 04/24/2018   Sig - Route: Take 1 tablet (300 mg total) by mouth every morning. - Oral   Sent to pharmacy as: buPROPion (WELLBUTRIN XL) 300 MG 24 hr tablet   E-Prescribing Status: Receipt confirmed by pharmacy (05/01/2017 2:41 PM EST)   [facility] [place] - [address]

## 2017-07-30 ENCOUNTER — Encounter: Payer: Self-pay | Admitting: Psychiatry

## 2017-07-30 ENCOUNTER — Other Ambulatory Visit: Payer: Self-pay

## 2017-07-30 ENCOUNTER — Ambulatory Visit: Payer: BLUE CROSS/BLUE SHIELD | Admitting: Psychiatry

## 2017-07-30 VITALS — BP 136/80 | HR 60 | Wt 175.4 lb

## 2017-07-30 DIAGNOSIS — F331 Major depressive disorder, recurrent, moderate: Secondary | ICD-10-CM

## 2017-07-30 MED ORDER — FLUOXETINE HCL 40 MG PO CAPS: 40.0000 mg | ORAL_CAPSULE | Freq: Every day | ORAL | 1 refills | Status: DC

## 2017-07-30 MED ORDER — FLUOXETINE HCL 10 MG PO CAPS: 10.0000 mg | ORAL_CAPSULE | Freq: Every day | ORAL | 1 refills | Status: DC

## 2017-07-30 MED ORDER — BUPROPION HCL ER (XL) 300 MG PO TB24: 300.0000 mg | ORAL_TABLET | ORAL | 1 refills | Status: DC

## 2017-07-30 NOTE — Progress Notes (Signed)
Patient ID: Jessica [facility], female   DOB: 07/11/1955, 62 y.o.   MRN: [id number]   [facility] MD/PA/NP OP Progress Note  07/30/2017 10:11 AM Jessica [facility]  MRN:  [id number]  Subjective:  Patient is a 62 yo woman who returns for follow-up of her major depressive disorder, recurrent moderate.   She reports that she is doing okay. Her husband had foot surgery and is recovering. States his recovery is taking longer than expected. Says she has gained some weight. Overall her life is the best now than it has ever been. Glad to be in her relationship, says her husband really loves her.  Fair sleep and appetite. Denies any suicidal thoughts.  Chief Complaint: Doing well Chief Complaint    Follow-up; Medication Refill     Visit Diagnosis:     ICD-10-CM   1. Major depressive disorder, recurrent episode, moderate (HCC) F33.1     Past Medical History:  Past Medical History:  Diagnosis Date  . Anxiety   . Arthritis    knees & back   . CAD S/P percutaneous coronary angioplasty 03/07/2011   a) PCI of mRCA with Promus Element DES 3.5 mm x 28 mm (4.2 -- 3.7 mm); b) Myoview 04/2011: EF 81% (small LV Cavity), fixed basal-mid Inferior Infarct, No Ischemia, 10 METS  . Complication of anesthesia   . Depression 03/08/2011   Dr. Leory Plowman- [place], [facility]  . Essential hypertension 03/07/2011  . Family history of adverse reaction to anesthesia   . GERD (gastroesophageal reflux disease)   . Headache    chocolate & red wine triggers /w bad headaches   . History of back surgery   . Hyperlipidemia with target LDL less than 70 2012    Statin Intolerance (tried Lipitor & Crestor)  . Insomnia    without significant sleeping disorders  . Iron deficiency anemia 03/09/2011  . Neuropathy   . PONV (postoperative nausea and vomiting)   . ST-segment elevation myocardial infarction (STEMI) of inferior wall (HCC) 03/07/2011   RCA Occlusion --> PCI of mRCA; ECHO 03/2011: EF > 55%, MIld Inferior HK, Mild AoV Sclerosis.  .  Vocal cord polyp 2005    Past Surgical History:  Procedure Laterality Date  . ABDOMINAL HYSTERECTOMY    . APPENDECTOMY  07/04/2000   Normal appendix, adhesions noted.  Marland Kitchen BACK SURGERY     cyst on spinal cord - lumbar  . BREAST BIOPSY Right 01/31/1999   fibrocystic changes, ductal adenosis, focal atypical ductal epithelium.  Marland Kitchen CARDIAC CATHETERIZATION  03/08/2011   improved flow from the PCI  . CHOLECYSTECTOMY  03/25/1998   Dr. Lemar Livings, chronic cholecystitis.  . COLONOSCOPY WITH PROPOFOL N/A 09/28/2014   Procedure: COLONOSCOPY WITH PROPOFOL;  Surgeon: Earline Mayotte, MD;  Location: [facility];  Service: Endoscopy;  Laterality: N/A;  . CORONARY ANGIOPLASTY WITH STENT PLACEMENT  03/07/2011   inferior STEMI - RCA PROMUS 3.5 mm x 28 mm DES  (post Dil 3.7 distal to 4.2 prox)  . DENTAL SURGERY  2016  . DOPPLER ECHOCARDIOGRAPHY  03/27/2011   LV cavity is small,EF =>55%,MILD INFERIOR HYPOKINESIS; Mild Aortic Sclerosis  . ESOPHAGOGASTRODUODENOSCOPY N/A 09/28/2014   Procedure: ESOPHAGOGASTRODUODENOSCOPY (EGD);  Surgeon: Earline Mayotte, MD;  Location: [facility] ENDOSCOPY;  Service: Endoscopy;  Laterality: N/A;  . EYE SURGERY     blepheroplasty- bilateral  . JOINT REPLACEMENT    . KNEE SURGERY Left 2015  . KNEE SURGERY Right 2010   Baker cyst  . LEFT HEART CATHETERIZATION WITH CORONARY ANGIOGRAM N/A  03/07/2011   Procedure: LEFT HEART CATHETERIZATION WITH CORONARY ANGIOGRAM;  Surgeon: Marykay Lexavid W Harding, MD;  [facility];  Service: Cardiovascular;  Laterality: N/A;  . LEFT HEART CATHETERIZATION WITH CORONARY ANGIOGRAM N/A 03/08/2011   Procedure: LEFT HEART CATHETERIZATION WITH CORONARY ANGIOGRAM;  Surgeon: Marykay Lexavid W Harding, MD;  [facility];  Service: Cardiovascular;  Laterality: N/A;  . NM MYOCAR PERF WALL MOTION  08/2016   Lexiscan: LOW RISK.  No ischemia or infarction.  Hyperdynamic LV function .  EF> 65%  . skin nodule Left 09/11/1999   dermatofibroma left lower leg,  positive lateral margin.  Marland Kitchen. TOTAL KNEE ARTHROPLASTY Bilateral 07/20/2015   Procedure: TOTAL KNEE BILATERAL;  Surgeon: Loreta Aveaniel F Murphy, MD;  [facility];  Service: Orthopedics;  Laterality: Bilateral;  . UPPER GI ENDOSCOPY  02-22-03   Dr. Lemar LivingsByrnett, normal   Family History:  Family History  Problem Relation Age of Onset  . Heart attack Mother   . Coronary artery disease Mother   . Stroke Mother   . Depression Mother   . Anxiety disorder Mother   . Alzheimer's disease Father   . Stroke Father   . Breast cancer Sister [id number]  . Depression Brother   . Drug abuse Brother   . Healthy Sister   . Bipolar disorder Sister    Social History:  Social History   Socioeconomic History  . Marital status: Married    Spouse name: Jessica CanalesSteve  . Number of children: 0  . Years of education: 16  . Highest education level: Bachelor's degree (e.g., BA, AB, BS)  Occupational History  . Occupation: unemployed  Social Needs  . Financial resource strain: Not on file  . Food insecurity:    Worry: Not on file    Inability: Not on file  . Transportation needs:    Medical: Not on file    Non-medical: Not on file  Tobacco Use  . Smoking status: Former Smoker    Packs/day: 0.25    Years: 40.00    Pack years: 10.00    Types: Cigarettes    Last attempt to quit: 03/07/2011    Years since quitting: 6.4  . Smokeless tobacco: Never Used  Substance and Sexual Activity  . Alcohol use: No    Alcohol/week: 0.0 oz  . Drug use: No  . Sexual activity: Yes  Lifestyle  . Physical activity:    Days per week: Not on file    Minutes per session: Not on file  . Stress: Not on file  Relationships  . Social connections:    Talks on phone: Not on file    Gets together: Not on file    Attends religious service: Not on file    Active member of club or organization: Not on file    Attends meetings of clubs or organizations: Not on file    Relationship status: Not on file  Other Topics Concern  . Not on file   Social History Narrative   Divorced woman -- Now Remarried to her long-term partner.   Exercises routinely on a daily basis roughly 45 minutes a day walking.  Quit smoking at the time of her MI. Does not drink.   Additional History:   Assessment:   Musculoskeletal: Strength & Muscle Tone: within normal limits Gait & Station: normal Patient leans: N/A  Psychiatric Specialty Exam: Medication Refill   Depression         Associated symptoms include does not have insomnia and no suicidal  ideas.   Review of Systems  Psychiatric/Behavioral: Negative for depression, hallucinations, memory loss, substance abuse and suicidal ideas. The patient is not nervous/anxious and does not have insomnia.   All other systems reviewed and are negative.   Blood pressure 136/80, pulse 60, weight 79.6 kg (175 lb 6.4 oz).Body mass index is 27.47 kg/m.  General Appearance: Neat and Well Groomed  Eye Contact:  Good  Speech:  Normal Rate  Volume:  Normal  Mood:  Doing well  Affect:  Smiling   Thought Process:  Linear  Orientation:  Full (Time, Place, and Person)  Thought Content:  Negative  Suicidal Thoughts:  No  Homicidal Thoughts:  No  Memory:  Immediate;   Good Recent;   Good Remote;   Good  Judgement:  Good  Insight:  Good  Psychomotor Activity:  Negative  Concentration:  Good  Recall:  Good  Fund of Knowledge: Negative  Language: Good  Akathisia:  Negative  Handed:  Right unknown   AIMS (if indicated):  NA  Assets:  Communication Skills Desire for Improvement  ADL's:  Intact  Cognition: WNL  Sleep:  good   Is the patient at risk to self?  No. Has the patient been a risk to self in the past 6 months?  No. Has the patient been a risk to self within the distant past?  No. Is the patient a risk to others?  No. Has the patient been a risk to others in the past 6 months?  No. Has the patient been a risk to others within the distant past?  No.  Current Medications: Current  Outpatient Medications  Medication Sig Dispense Refill  . benazepril (LOTENSIN) 5 MG tablet Take 1 tablet (5 mg total) by mouth daily before breakfast. 90 tablet 2  . buPROPion (WELLBUTRIN XL) 300 MG 24 hr tablet Take 1 tablet (300 mg total) by mouth every morning. 90 tablet 1  . clopidogrel (PLAVIX) 75 MG tablet Take 1 tablet (75 mg total) by mouth daily. 90 tablet 2  . Evolocumab (REPATHA SURECLICK) 140 MG/ML SOAJ Inject 140 mg into the skin every 14 (fourteen) days. 2 pen 11  . FLUoxetine (PROZAC) 10 MG capsule Take 1 capsule (10 mg total) by mouth daily. 90 capsule 1  . FLUoxetine (PROZAC) 40 MG capsule Take 1 capsule (40 mg total) by mouth daily before breakfast. 90 capsule 1  . gabapentin (NEURONTIN) 300 MG capsule Take 2 capsules (600 mg total) by mouth at bedtime. 180 capsule 3  . metoprolol tartrate (LOPRESSOR) 50 MG tablet Take 1 tablet (50 mg total) by mouth 2 (two) times daily. 180 tablet 3  . nitroGLYCERIN (NITROSTAT) 0.4 MG SL tablet Place 1 tablet (0.4 mg total) under the tongue every 5 (five) minutes as needed for chest pain (up to 3 tabs in 15 mins and then call 911). 25 tablet 1  . omeprazole (PRILOSEC) 20 MG capsule Take 1 capsule (20 mg total) by mouth daily. 90 capsule 2   No current facility-administered medications for this visit.       Medical Decision Making:  Established Problem, Stable/Improving (1) and Review of Medication Regimen & Side Effects (2)  Treatment Plan Summary:Medication management and Plan   Major depressive disorder, recurrent, moderate- We will continue Wellbutrin XL 300 mg daily. Continue Prozac at 50mg .   Insomnia Resolved  Patient will follow up in 3 months.  Takashi Korol 07/30/2017, 10:11 AM

## 2017-08-06 NOTE — Telephone Encounter (Signed)
Please call [facility]

## 2017-08-29 ENCOUNTER — Other Ambulatory Visit: Payer: Self-pay | Admitting: Cardiology

## 2017-10-29 ENCOUNTER — Encounter: Payer: Self-pay | Admitting: Psychiatry

## 2017-10-29 ENCOUNTER — Ambulatory Visit: Payer: BLUE CROSS/BLUE SHIELD | Admitting: Psychiatry

## 2017-10-29 ENCOUNTER — Other Ambulatory Visit: Payer: Self-pay

## 2017-10-29 VITALS — BP 115/73 | HR 57 | Temp 98.0°F | Wt 174.0 lb

## 2017-10-29 DIAGNOSIS — F331 Major depressive disorder, recurrent, moderate: Secondary | ICD-10-CM | POA: Diagnosis not present

## 2017-10-29 NOTE — Progress Notes (Signed)
Patient ID: [facility], female   DOB: 1955-08-17, 62 y.o.   MRN: [id number]   [facility] MD/PA/NP OP Progress Note  10/29/2017 10:39 AM MAYRA JOLLIFFE  MRN:  [id number]  Subjective:  Patient is a 62 yo woman who returns for follow-up of her major depressive disorder, recurrent moderate.   She reports that a lot has gone since the last 3 months. She lost one of her dogs due to ill health an dit has been very difficult for her. She reports she is dealing with the grief but holding up okay. Fair sleep and appetite. Denies any suicidal thoughts.  Chief Complaint: Doing well Chief Complaint    Follow-up; Medication Refill     Visit Diagnosis:   No diagnosis found.  Past Medical History:  Past Medical History:  Diagnosis Date  . Anxiety   . Arthritis    knees & back   . CAD S/P percutaneous coronary angioplasty 03/07/2011   a) PCI of mRCA with Promus Element DES 3.5 mm x 28 mm (4.2 -- 3.7 mm); b) Myoview 04/2011: EF 81% (small LV Cavity), fixed basal-mid Inferior Infarct, No Ischemia, 10 METS  . Complication of anesthesia   . Depression 03/08/2011   Dr. Leory Plowman- [place], [facility]  . Essential hypertension 03/07/2011  . Family history of adverse reaction to anesthesia   . GERD (gastroesophageal reflux disease)   . Headache    chocolate & red wine triggers /w bad headaches   . History of back surgery   . Hyperlipidemia with target LDL less than 70 2012    Statin Intolerance (tried Lipitor & Crestor)  . Insomnia    without significant sleeping disorders  . Iron deficiency anemia 03/09/2011  . Neuropathy   . PONV (postoperative nausea and vomiting)   . ST-segment elevation myocardial infarction (STEMI) of inferior wall (HCC) 03/07/2011   RCA Occlusion --> PCI of mRCA; ECHO 03/2011: EF > 55%, MIld Inferior HK, Mild AoV Sclerosis.  . Vocal cord polyp 2005    Past Surgical History:  Procedure Laterality Date  . ABDOMINAL HYSTERECTOMY    . APPENDECTOMY  07/04/2000   Normal appendix,  adhesions noted.  Marland Kitchen BACK SURGERY     cyst on spinal cord - lumbar  . BREAST BIOPSY Right 01/31/1999   fibrocystic changes, ductal adenosis, focal atypical ductal epithelium.  Marland Kitchen CARDIAC CATHETERIZATION  03/08/2011   improved flow from the PCI  . CHOLECYSTECTOMY  03/25/1998   Dr. Lemar Livings, chronic cholecystitis.  . COLONOSCOPY WITH PROPOFOL N/A 09/28/2014   Procedure: COLONOSCOPY WITH PROPOFOL;  Surgeon: Earline Mayotte, MD;  [facility] END[facility]COPY;  Service: Endoscopy;  Laterality: N/A;  . CORONARY ANGIOPLASTY WITH STENT PLACEMENT  03/07/2011   inferior STEMI - RCA PROMUS 3.5 mm x 28 mm DES  (post Dil 3.7 distal to 4.2 prox)  . DENTAL SURGERY  2016  . DOPPLER ECHOCARDIOGRAPHY  03/27/2011   LV cavity is small,EF =>55%,MILD INFERIOR HYPOKINESIS; Mild Aortic Sclerosis  . ESOPHAGOGASTRODUODENOSCOPY N/A 09/28/2014   Procedure: ESOPHAGOGASTRODUODENOSCOPY (EGD);  Surgeon: Earline Mayotte, MD;  Location: [facility] ENDOSCOPY;  Service: Endoscopy;  Laterality: N/A;  . EYE SURGERY     blepheroplasty- bilateral  . JOINT REPLACEMENT    . KNEE SURGERY Left 2015  . KNEE SURGERY Right 2010   Baker cyst  . LEFT HEART CATHETERIZATION WITH CORONARY ANGIOGRAM N/A 03/07/2011   Procedure: LEFT HEART CATHETERIZATION WITH CORONARY ANGIOGRAM;  Surgeon: Marykay Lex, MD;  Location: [facility] CATH LAB;  Service: Cardiovascular;  Laterality:  N/A;  . LEFT HEART CATHETERIZATION WITH CORONARY ANGIOGRAM N/A 03/08/2011   Procedure: LEFT HEART CATHETERIZATION WITH CORONARY ANGIOGRAM;  Surgeon: Marykay Lex, MD;  Location: [facility] CATH LAB;  Service: Cardiovascular;  Laterality: N/A;  . NM MYOCAR PERF WALL MOTION  08/2016   Lexiscan: LOW RISK.  No ischemia or infarction.  Hyperdynamic LV function .  EF> 65%  . skin nodule Left 09/11/1999   dermatofibroma left lower leg, positive lateral margin.  Marland Kitchen TOTAL KNEE ARTHROPLASTY Bilateral 07/20/2015   Procedure: TOTAL KNEE BILATERAL;  Surgeon: Loreta Ave, MD;  Location: [facility] OR;   Service: Orthopedics;  Laterality: Bilateral;  . UPPER GI ENDOSCOPY  02-22-03   Dr. Lemar Livings, normal   Family History:  Family History  Problem Relation Age of Onset  . Heart attack Mother   . Coronary artery disease Mother   . Stroke Mother   . Depression Mother   . Anxiety disorder Mother   . Alzheimer's disease Father   . Stroke Father   . Breast cancer Sister 83  . Depression Brother   . Drug abuse Brother   . Healthy Sister   . Bipolar disorder Sister    Social History:  Social History   Socioeconomic History  . Marital status: Married    Spouse name: Brett Canales  . Number of children: 0  . Years of education: 16  . Highest education level: Bachelor's degree (e.g., BA, AB, BS)  Occupational History  . Occupation: unemployed  Social Needs  . Financial resource strain: Not on file  . Food insecurity:    Worry: Not on file    Inability: Not on file  . Transportation needs:    Medical: Not on file    Non-medical: Not on file  Tobacco Use  . Smoking status: Former Smoker    Packs/day: 0.25    Years: 40.00    Pack years: 10.00    Types: Cigarettes    Last attempt to quit: 03/07/2011    Years since quitting: 6.6  . Smokeless tobacco: Never Used  Substance and Sexual Activity  . Alcohol use: No    Alcohol/week: 0.0 oz  . Drug use: No  . Sexual activity: Yes  Lifestyle  . Physical activity:    Days per week: Not on file    Minutes per session: Not on file  . Stress: Not on file  Relationships  . Social connections:    Talks on phone: Not on file    Gets together: Not on file    Attends religious service: Not on file    Active member of club or organization: Not on file    Attends meetings of clubs or organizations: Not on file    Relationship status: Not on file  Other Topics Concern  . Not on file  Social History Narrative   Divorced woman -- Now Remarried to her long-term partner.   Exercises routinely on a daily basis roughly 45 minutes a day walking.   Quit smoking at the time of her MI. Does not drink.   Additional History:   Assessment:   Musculoskeletal: Strength & Muscle Tone: within normal limits Gait & Station: normal Patient leans: N/A  Psychiatric Specialty Exam: Medication Refill   Depression         Associated symptoms include does not have insomnia and no suicidal ideas.   Review of Systems  Psychiatric/Behavioral: Negative for depression, hallucinations, memory loss, substance abuse and suicidal ideas. The patient is not nervous/anxious and does  not have insomnia.   All other systems reviewed and are negative.   Blood pressure 115/73, pulse (!) 57, temperature 98 F (36.7 C), temperature source Oral, weight 78.9 kg (174 lb).Body mass index is 27.25 kg/m.  General Appearance: Neat and Well Groomed  Eye Contact:  Good  Speech:  Normal Rate  Volume:  Normal  Mood:  Doing well  Affect:  Smiling   Thought Process:  Linear  Orientation:  Full (Time, Place, and Person)  Thought Content:  Negative  Suicidal Thoughts:  No  Homicidal Thoughts:  No  Memory:  Immediate;   Good Recent;   Good Remote;   Good  Judgement:  Good  Insight:  Good  Psychomotor Activity:  Negative  Concentration:  Good  Recall:  Good  Fund of Knowledge: Negative  Language: Good  Akathisia:  Negative  Handed:  Right unknown   AIMS (if indicated):  NA  Assets:  Communication Skills Desire for Improvement  ADL's:  Intact  Cognition: WNL  Sleep:  good   Is the patient at risk to self?  No. Has the patient been a risk to self in the past 6 months?  No. Has the patient been a risk to self within the distant past?  No. Is the patient a risk to others?  No. Has the patient been a risk to others in the past 6 months?  No. Has the patient been a risk to others within the distant past?  No.  Current Medications: Current Outpatient Medications  Medication Sig Dispense Refill  . benazepril (LOTENSIN) 5 MG tablet Take 1 tablet (5 mg total)  by mouth daily before breakfast. 90 tablet 2  . buPROPion (WELLBUTRIN XL) 300 MG 24 hr tablet Take 1 tablet (300 mg total) by mouth every morning. 90 tablet 1  . clopidogrel (PLAVIX) 75 MG tablet Take 1 tablet (75 mg total) by mouth daily. 90 tablet 2  . Evolocumab (REPATHA SURECLICK) 140 MG/ML SOAJ Inject 140 mg into the skin every 14 (fourteen) days. 2 pen 11  . FLUoxetine (PROZAC) 10 MG capsule Take 1 capsule (10 mg total) by mouth daily. 90 capsule 1  . FLUoxetine (PROZAC) 40 MG capsule Take 1 capsule (40 mg total) by mouth daily before breakfast. 90 capsule 1  . gabapentin (NEURONTIN) 300 MG capsule Take 2 capsules (600 mg total) by mouth at bedtime. 180 capsule 3  . metoprolol tartrate (LOPRESSOR) 50 MG tablet TAKE 1 TABLET BY MOUTH TWICE DAILY. GENERIC EQUIVALENT FOR LOPRESSOR. 180 tablet 0  . nitroGLYCERIN (NITROSTAT) 0.4 MG SL tablet Place 1 tablet (0.4 mg total) under the tongue every 5 (five) minutes as needed for chest pain (up to 3 tabs in 15 mins and then call 911). 25 tablet 1  . omeprazole (PRILOSEC) 20 MG capsule Take 1 capsule (20 mg total) by mouth daily. 90 capsule 2   No current facility-administered medications for this visit.       Medical Decision Making:  Established Problem, Stable/Improving (1) and Review of Medication Regimen & Side Effects (2)  Treatment Plan Summary:Medication management and Plan   Major depressive disorder, recurrent, moderate- We will continue Wellbutrin XL 300 mg daily. Continue Prozac at 50mg .   Insomnia Resolved  Patient will follow up in 3 months.  Laurette Villescas 10/29/2017, 10:39 AM

## 2017-11-12 ENCOUNTER — Other Ambulatory Visit: Payer: Self-pay | Admitting: Cardiology

## 2017-11-12 NOTE — Telephone Encounter (Signed)
Rx request sent to pharmacy.  

## 2018-01-28 ENCOUNTER — Encounter: Payer: Self-pay | Admitting: Psychiatry

## 2018-01-28 ENCOUNTER — Ambulatory Visit (INDEPENDENT_AMBULATORY_CARE_PROVIDER_SITE_OTHER): Payer: BLUE CROSS/BLUE SHIELD | Admitting: Psychiatry

## 2018-01-28 DIAGNOSIS — F331 Major depressive disorder, recurrent, moderate: Secondary | ICD-10-CM | POA: Diagnosis not present

## 2018-01-28 MED ORDER — FLUOXETINE HCL 40 MG PO CAPS: 40.0000 mg | ORAL_CAPSULE | Freq: Every day | ORAL | 1 refills | Status: DC

## 2018-01-28 MED ORDER — BUPROPION HCL ER (XL) 300 MG PO TB24: 300.0000 mg | ORAL_TABLET | ORAL | 1 refills | Status: DC

## 2018-01-28 MED ORDER — FLUOXETINE HCL 10 MG PO CAPS: 10.0000 mg | ORAL_CAPSULE | Freq: Every day | ORAL | 1 refills | Status: DC

## 2018-01-28 NOTE — Progress Notes (Signed)
Patient ID: Jessica Landry, female   DOB: February 28, 1956, 62 y.o.   MRN: [id number]   [facility] MD/PA/NP OP Progress Note  01/28/2018 10:54 AM Jessica Landry  MRN:  [id number]  Subjective:  Patient is a 62 yo woman who returns for follow-up of her major depressive disorder, recurrent moderate.   She reports that she ran out of wellbutrin and was unable to get a refill. She has been out for a week. She has been more moody and having low energy. Continues to enjoy her church activities. . Fair sleep and appetite. Denies any suicidal thoughts.  Chief Complaint: Doing ok  Visit Diagnosis:     ICD-10-CM   1. Major depressive disorder, recurrent episode, moderate (HCC) F33.1     Past Medical History:  Past Medical History:  Diagnosis Date  . Anxiety   . Arthritis    knees & back   . CAD S/P percutaneous coronary angioplasty 03/07/2011   a) PCI of mRCA with Promus Element DES 3.5 mm x 28 mm (4.2 -- 3.7 mm); b) Myoview 04/2011: EF 81% (small LV Cavity), fixed basal-mid Inferior Infarct, No Ischemia, 10 METS  . Complication of anesthesia   . Depression 03/08/2011   Dr. Leory Plowman- [place], [facility]  . Essential hypertension 03/07/2011  . Family history of adverse reaction to anesthesia   . GERD (gastroesophageal reflux disease)   . Headache    chocolate & red wine triggers /w bad headaches   . History of back surgery   . Hyperlipidemia with target LDL less than 70 2012    Statin Intolerance (tried Lipitor & Crestor)  . Insomnia    without significant sleeping disorders  . Iron deficiency anemia 03/09/2011  . Neuropathy   . PONV (postoperative nausea and vomiting)   . ST-segment elevation myocardial infarction (STEMI) of inferior wall (HCC) 03/07/2011   RCA Occlusion --> PCI of mRCA; ECHO 03/2011: EF > 55%, MIld Inferior HK, Mild AoV Sclerosis.  . Vocal cord polyp 2005    Past Surgical History:  Procedure Laterality Date  . ABDOMINAL HYSTERECTOMY    . APPENDECTOMY  07/04/2000   Normal  appendix, adhesions noted.  Marland Kitchen BACK SURGERY     cyst on spinal cord - lumbar  . BREAST BIOPSY Right 01/31/1999   fibrocystic changes, ductal adenosis, focal atypical ductal epithelium.  Marland Kitchen CARDIAC CATHETERIZATION  03/08/2011   improved flow from the PCI  . CHOLECYSTECTOMY  03/25/1998   Dr. Lemar Livings, chronic cholecystitis.  . COLONOSCOPY WITH PROPOFOL N/A 09/28/2014   Procedure: COLONOSCOPY WITH PROPOFOL;  Surgeon: Earline Mayotte, MD;  Location[facility] ENDOSCOPY;  Service: Endoscopy;  Laterality: N/A;  . CORONARY ANGIOPLASTY WITH STENT PLACEMENT  03/07/2011   inferior STEMI - RCA PROMUS 3.5 mm x 28 mm DES  (post Dil 3.7 distal to 4.2 prox)  . DENTAL SURGERY  2016  . DOPPLER ECHOCARDIOGRAPHY  03/27/2011   LV cavity is small,EF =>55%,MILD INFERIOR HYPOKINESIS; Mild Aortic Sclerosis  . ESOPHAGOGASTRODUODENOSCOPY N/A 09/28/2014   Procedure: ESOPHAGOGASTRODUODENOSCOPY (EGD);  Surgeon: Earline Mayotte, MD;  Location: [facility] ENDOSCOPY;  Service: Endoscopy;  Laterality: N/A;  . EYE SURGERY     blepheroplasty- bilateral  . JOINT REPLACEMENT    . KNEE SURGERY Left 2015  . KNEE SURGERY Right 2010   Baker cyst  . LEFT HEART CATHETERIZATION WITH CORONARY ANGIOGRAM N/A 03/07/2011   Procedure: LEFT HEART CATHETERIZATION WITH CORONARY ANGIOGRAM;  Surgeon: Marykay Lex, MD;  Location: [facility] CATH LAB;  Service: Cardiovascular;  Laterality: N/A;  .  LEFT HEART CATHETERIZATION WITH CORONARY ANGIOGRAM N/A 03/08/2011   Procedure: LEFT HEART CATHETERIZATION WITH CORONARY ANGIOGRAM;  Surgeon: Marykay Lex, MD;  Location: [facility] CATH LAB;  Service: Cardiovascular;  Laterality: N/A;  . NM MYOCAR PERF WALL MOTION  08/2016   Lexiscan: LOW RISK.  No ischemia or infarction.  Hyperdynamic LV function .  EF> 65%  . skin nodule Left 09/11/1999   dermatofibroma left lower leg, positive lateral margin.  Marland Kitchen TOTAL KNEE ARTHROPLASTY Bilateral 07/20/2015   Procedure: TOTAL KNEE BILATERAL;  Surgeon: Loreta Ave, MD;  Location:  [facility] OR;  Service: Orthopedics;  Laterality: Bilateral;  . UPPER GI ENDOSCOPY  02-22-03   Dr. Lemar Livings, normal   Family History:  Family History  Problem Relation Age of Onset  . Heart attack Mother   . Coronary artery disease Mother   . Stroke Mother   . Depression Mother   . Anxiety disorder Mother   . Alzheimer's disease Father   . Stroke Father   . Breast cancer Sister 34  . Depression Brother   . Drug abuse Brother   . Healthy Sister   . Bipolar disorder Sister    Social History:  Social History   Socioeconomic History  . Marital status: Married    Spouse name: Brett Canales  . Number of children: 0  . Years of education: 16  . Highest education level: Bachelor's degree (e.g., BA, AB, BS)  Occupational History  . Occupation: unemployed  Social Needs  . Financial resource strain: Not on file  . Food insecurity:    Worry: Not on file    Inability: Not on file  . Transportation needs:    Medical: Not on file    Non-medical: Not on file  Tobacco Use  . Smoking status: Former Smoker    Packs/day: 0.25    Years: 40.00    Pack years: 10.00    Types: Cigarettes    Last attempt to quit: 03/07/2011    Years since quitting: 6.9  . Smokeless tobacco: Never Used  Substance and Sexual Activity  . Alcohol use: No    Alcohol/week: 0.0 standard drinks  . Drug use: No  . Sexual activity: Yes  Lifestyle  . Physical activity:    Days per week: Not on file    Minutes per session: Not on file  . Stress: Not on file  Relationships  . Social connections:    Talks on phone: Not on file    Gets together: Not on file    Attends religious service: Not on file    Active member of club or organization: Not on file    Attends meetings of clubs or organizations: Not on file    Relationship status: Not on file  Other Topics Concern  . Not on file  Social History Narrative   Divorced woman -- Now Remarried to her long-term partner.   Exercises routinely on a daily basis roughly 45  minutes a day walking.  Quit smoking at the time of her MI. Does not drink.   Additional History:   Assessment:   Musculoskeletal: Strength & Muscle Tone: within normal limits Gait & Station: normal Patient leans: N/A  Psychiatric Specialty Exam: Medication Refill   Depression         Associated symptoms include does not have insomnia and no suicidal ideas.   Review of Systems  Psychiatric/Behavioral: Negative for depression, hallucinations, memory loss, substance abuse and suicidal ideas. The patient is not nervous/anxious and does not have  insomnia.   All other systems reviewed and are negative.   There were no vitals taken for this visit.There is no height or weight on file to calculate BMI.  General Appearance: Neat and Well Groomed  Eye Contact:  Good  Speech:  Normal Rate  Volume:  Normal  Mood:  Low over past week  Affect:  Smiling   Thought Process:  Linear  Orientation:  Full (Time, Place, and Person)  Thought Content:  Negative  Suicidal Thoughts:  No  Homicidal Thoughts:  No  Memory:  Immediate;   Good Recent;   Good Remote;   Good  Judgement:  Good  Insight:  Good  Psychomotor Activity:  Negative  Concentration:  Good  Recall:  Good  Fund of Knowledge: Negative  Language: Good  Akathisia:  Negative  Handed:  Right unknown   AIMS (if indicated):  NA  Assets:  Communication Skills Desire for Improvement  ADL's:  Intact  Cognition: WNL  Sleep:  good   Is the patient at risk to self?  No. Has the patient been a risk to self in the past 6 months?  No. Has the patient been a risk to self within the distant past?  No. Is the patient a risk to others?  No. Has the patient been a risk to others in the past 6 months?  No. Has the patient been a risk to others within the distant past?  No.  Current Medications: Current Outpatient Medications  Medication Sig Dispense Refill  . benazepril (LOTENSIN) 5 MG tablet Take 1 tablet (5 mg total) by mouth daily  before breakfast. 90 tablet 2  . buPROPion (WELLBUTRIN XL) 300 MG 24 hr tablet Take 1 tablet (300 mg total) by mouth every morning. 90 tablet 1  . clopidogrel (PLAVIX) 75 MG tablet Take 1 tablet (75 mg total) by mouth daily. 90 tablet 2  . Evolocumab (REPATHA SURECLICK) 140 MG/ML SOAJ Inject 140 mg into the skin every 14 (fourteen) days. 2 pen 11  . FLUoxetine (PROZAC) 10 MG capsule Take 1 capsule (10 mg total) by mouth daily. 90 capsule 1  . FLUoxetine (PROZAC) 40 MG capsule Take 1 capsule (40 mg total) by mouth daily before breakfast. 90 capsule 1  . gabapentin (NEURONTIN) 300 MG capsule Take 2 capsules (600 mg total) by mouth at bedtime. 180 capsule 3  . metoprolol tartrate (LOPRESSOR) 50 MG tablet TAKE 1 TABLET BY MOUTH TWICE DAILY. GENERIC EQUIVALENT FOR LOPRESSOR. 180 tablet 0  . nitroGLYCERIN (NITROSTAT) 0.4 MG SL tablet Place 1 tablet (0.4 mg total) under the tongue every 5 (five) minutes as needed for chest pain (up to 3 tabs in 15 mins and then call 911). 25 tablet 1  . omeprazole (PRILOSEC) 20 MG capsule Take 1 capsule (20 mg total) by mouth daily. 90 capsule 2   No current facility-administered medications for this visit.       Medical Decision Making:  Established Problem, Stable/Improving (1) and Review of Medication Regimen & Side Effects (2)  Treatment Plan Summary:Medication management and Plan   Major depressive disorder, recurrent, moderate-  Continue Wellbutrin XL 300 mg daily. Continue Prozac at 50mg .   Insomnia Resolved  Patient will follow up in 3 months.  Naveed Humphres 01/28/2018, 10:54 AM

## 2018-02-10 ENCOUNTER — Other Ambulatory Visit: Payer: Self-pay | Admitting: Cardiology

## 2018-02-11 ENCOUNTER — Other Ambulatory Visit: Payer: Self-pay | Admitting: Cardiology

## 2018-02-11 MED ORDER — METOPROLOL TARTRATE 50 MG PO TABS: ORAL_TABLET | ORAL | 0 refills | Status: DC

## 2018-02-11 NOTE — Telephone Encounter (Signed)
° ° °*  STAT* If patient is at the pharmacy, call can be transferred to refill team.   1. Which medications need to be refilled? (please list name of each medication and dose if known) metoprolol tartrate (LOPRESSOR) 50 MG tablet  2. Which pharmacy/location (including street and city if local pharmacy) is medication to be sent to?[id number] - [place], [place] - [address]  3. Do they need a 30 day or 90 day supply? 90

## 2018-03-05 ENCOUNTER — Other Ambulatory Visit: Payer: Self-pay | Admitting: Family Medicine

## 2018-03-05 DIAGNOSIS — K219 Gastro-esophageal reflux disease without esophagitis: Secondary | ICD-10-CM

## 2018-03-05 DIAGNOSIS — M9979 Connective tissue and disc stenosis of intervertebral foramina of abdomen and other regions: Secondary | ICD-10-CM

## 2018-03-12 ENCOUNTER — Encounter: Payer: Self-pay | Admitting: Family Medicine

## 2018-03-21 ENCOUNTER — Ambulatory Visit: Payer: BLUE CROSS/BLUE SHIELD | Admitting: Cardiology

## 2018-03-21 ENCOUNTER — Encounter: Payer: Self-pay | Admitting: Cardiology

## 2018-03-21 VITALS — BP 124/62 | HR 57 | Ht 66.0 in | Wt 176.0 lb

## 2018-03-21 DIAGNOSIS — Z955 Presence of coronary angioplasty implant and graft: Secondary | ICD-10-CM | POA: Diagnosis not present

## 2018-03-21 DIAGNOSIS — I2510 Atherosclerotic heart disease of native coronary artery without angina pectoris: Secondary | ICD-10-CM

## 2018-03-21 DIAGNOSIS — I2119 ST elevation (STEMI) myocardial infarction involving other coronary artery of inferior wall: Secondary | ICD-10-CM

## 2018-03-21 DIAGNOSIS — E785 Hyperlipidemia, unspecified: Secondary | ICD-10-CM | POA: Diagnosis not present

## 2018-03-21 DIAGNOSIS — Z9861 Coronary angioplasty status: Secondary | ICD-10-CM | POA: Diagnosis not present

## 2018-03-21 DIAGNOSIS — E663 Overweight: Secondary | ICD-10-CM

## 2018-03-21 DIAGNOSIS — I10 Essential (primary) hypertension: Secondary | ICD-10-CM

## 2018-03-21 MED ORDER — ASPIRIN EC 81 MG PO TBEC: 81.0000 mg | DELAYED_RELEASE_TABLET | Freq: Every day | ORAL | 3 refills | Status: DC

## 2018-03-21 NOTE — Progress Notes (Signed)
PCP: Erasmo Downer, MD  Clinic Note: Chief Complaint  Patient presents with  . Follow-up    1 year.  Doing well  . Headache    At times.  . Coronary Artery Disease    No angina    HPI: Jessica Landry is a 62 y.o. female with a PMH notable for history of inferior STEMI with PCI to the RCA and intermittent episodes of syncope/near syncope who presents here in for annual follow-up.   Jessica Landry was last seen in November 2018.  She had had a syncopal episode back in May timeframe.  She had a Myoview at that time that was negative for ischemia.    Recent Hospitalizations:   None  She has had issues over the last year with depression symptoms, but is otherwise doing fairly well from a cardiac standpoint.  Studies Personally Reviewed - (if available, images/films reviewed: From Epic Chart or Care Everywhere)  None  Interval History: Jessica Landry returns today really doing well.  She gave herself a year off having retired and is gotten somewhat sedentary.  Results she is somewhat deconditioned and may get short of breath faster than she used to, she is not having any significant exertional chest discomfort or dyspnea.  She is enjoying having both knees replaced and not having that knee pain. She is not having any of the nagging chest discomfort that she has had for years.  No PND, orthopnea or edema. No rapid heartbeats or palpitations.  No syncope/near syncope or TIA/amaurosis fugax. She does get little bit dizzy if she looks up and reaches up high, but not orthostatic in nature. No claudication symptoms.   She really is not doing much exercise, but is doing things like mowing the lawn and gardening.  She needs to get back into doing exercise and realizes that but has enjoyed the first year of retirement.  A lot of this was coming to grips with some of the subtleties of getting older and now that she seems to be in better spirits hopefully will be able to get back into  exercise.  Still doing well with the Repatha.  ROS: A comprehensive was performed. Review of Systems  Constitutional: Negative for malaise/fatigue.  Respiratory: Negative for cough and shortness of breath.   Gastrointestinal: Negative for abdominal pain and heartburn.  Genitourinary: Negative for dysuria.  Musculoskeletal: Negative for joint pain and myalgias.  Neurological: Negative for dizziness (Only with looking up and reaching up high).  Psychiatric/Behavioral: Negative for depression (Symptoms seem to be much better controlled). The patient is not nervous/anxious.   All other systems reviewed and are negative.  I have reviewed and (if needed) personally updated the patient's problem list, medications, allergies, past medical and surgical history, social and family history.   Past Medical History:  Diagnosis Date  . Anxiety   . Arthritis    knees & back   . CAD S/P percutaneous coronary angioplasty 03/07/2011   a) PCI of mRCA with Promus Element DES 3.5 mm x 28 mm (4.2 -- 3.7 mm); b) Myoview 04/2011: EF 81% (small LV Cavity), fixed basal-mid Inferior Infarct, No Ischemia, 10 METS  . Complication of anesthesia   . Depression 03/08/2011   Dr. Leory Plowman- [place]  . Essential hypertension 03/07/2011  . Family history of adverse reaction to anesthesia   . GERD (gastroesophageal reflux disease)   . Headache    chocolate & red wine triggers /w bad headaches   . History of back  surgery   . Hyperlipidemia with target LDL less than 70 2012    Statin Intolerance (tried Lipitor & Crestor)  . Insomnia    without significant sleeping disorders  . Iron deficiency anemia 03/09/2011  . Neuropathy   . PONV (postoperative nausea and vomiting)   . ST-segment elevation myocardial infarction (STEMI) of inferior wall (HCC) 03/07/2011   RCA Occlusion --> PCI of mRCA; ECHO 03/2011: EF > 55%, MIld Inferior HK, Mild AoV Sclerosis.  . Vocal cord polyp 2005    Past Surgical History:   Procedure Laterality Date  . ABDOMINAL HYSTERECTOMY    . APPENDECTOMY  07/04/2000   Normal appendix, adhesions noted.  Marland Kitchen. BACK SURGERY     cyst on spinal cord - lumbar  . BREAST BIOPSY Right 01/31/1999   fibrocystic changes, ductal adenosis, focal atypical ductal epithelium.  Marland Kitchen. CARDIAC CATHETERIZATION  03/08/2011   improved flow from the PCI  . CHOLECYSTECTOMY  03/25/1998   Dr. Lemar LivingsByrnett, chronic cholecystitis.  . COLONOSCOPY WITH PROPOFOL N/A 09/28/2014   Procedure: COLONOSCOPY WITH PROPOFOL;  Surgeon: Earline MayotteJeffrey W Byrnett, MD;  [facility];  Service: Endoscopy;  Laterality: N/A;  . CORONARY ANGIOPLASTY WITH STENT PLACEMENT  03/07/2011   inferior STEMI - RCA PROMUS 3.5 mm x 28 mm DES  (post Dil 3.7 distal to 4.2 prox)  . DENTAL SURGERY  2016  . DOPPLER ECHOCARDIOGRAPHY  03/27/2011   LV cavity is small,EF =>55%,MILD INFERIOR HYPOKINESIS; Mild Aortic Sclerosis  . ESOPHAGOGASTRODUODENOSCOPY N/A 09/28/2014   Procedure: ESOPHAGOGASTRODUODENOSCOPY (EGD);  Surgeon: Earline MayotteJeffrey W Byrnett, MD;  [facility];  Service: Endoscopy;  Laterality: N/A;  . EYE SURGERY     blepheroplasty- bilateral  . JOINT REPLACEMENT    . KNEE SURGERY Left 2015  . KNEE SURGERY Right 2010   Baker cyst  . LEFT HEART CATHETERIZATION WITH CORONARY ANGIOGRAM N/A 03/07/2011   Procedure: LEFT HEART CATHETERIZATION WITH CORONARY ANGIOGRAM;  Surgeon: Marykay Lexavid W Harding, MD;  [facility];  Service: Cardiovascular;  Laterality: N/A;  . LEFT HEART CATHETERIZATION WITH CORONARY ANGIOGRAM N/A 03/08/2011   Procedure: LEFT HEART CATHETERIZATION WITH CORONARY ANGIOGRAM;  Surgeon: Marykay Lexavid W Harding, MD;  [facility];  Service: Cardiovascular;  Laterality: N/A;  . NM MYOCAR PERF WALL MOTION  08/2016   Lexiscan: LOW RISK.  No ischemia or infarction.  Hyperdynamic LV function .  EF> 65%  . skin nodule Left 09/11/1999   dermatofibroma left lower leg, positive lateral margin.  Marland Kitchen. TOTAL KNEE ARTHROPLASTY  Bilateral 07/20/2015   Procedure: TOTAL KNEE BILATERAL;  Surgeon: Loreta Aveaniel F Murphy, MD;  [facility];  Service: Orthopedics;  Laterality: Bilateral;  . UPPER GI ENDOSCOPY  02-22-03   Dr. Lemar LivingsByrnett, normal    Current Meds  Medication Sig  . benazepril (LOTENSIN) 5 MG tablet Take 1 tablet (5 mg total) by mouth daily before breakfast.  . buPROPion (WELLBUTRIN XL) 300 MG 24 hr tablet Take 1 tablet (300 mg total) by mouth every morning.  . Evolocumab (REPATHA SURECLICK) 140 MG/ML SOAJ Inject 140 mg into the skin every 14 (fourteen) days.  Marland Kitchen. FLUoxetine (PROZAC) 10 MG capsule Take 1 capsule (10 mg total) by mouth daily.  Marland Kitchen. FLUoxetine (PROZAC) 40 MG capsule Take 1 capsule (40 mg total) by mouth daily before breakfast.  . gabapentin (NEURONTIN) 300 MG capsule Take 2 capsules (600 mg total) by mouth at bedtime.  . gabapentin (NEURONTIN) 300 MG capsule TAKE 2 CAPSULES(600 MG) BY MOUTH AT BEDTIME  . metoprolol tartrate (LOPRESSOR)  50 MG tablet TAKE 1 TABLET BY MOUTH TWICE DAILY. GENERIC EQUIVALENT FOR LOPRESSOR.  . nitroGLYCERIN (NITROSTAT) 0.4 MG SL tablet Place 1 tablet (0.4 mg total) under the tongue every 5 (five) minutes as needed for chest pain (up to 3 tabs in 15 mins and then call 911).  Marland Kitchen omeprazole (PRILOSEC) 20 MG capsule TAKE 1 CAPSULE(20 MG) BY MOUTH DAILY  . [DISCONTINUED] clopidogrel (PLAVIX) 75 MG tablet Take 1 tablet (75 mg total) by mouth daily.    Allergies  Allergen Reactions  . Bee Venom Anaphylaxis  . Statins Other (See Comments)    Myalgias & fatigue  . Etodolac   . Prednisone Palpitations and Other (See Comments)    Can take small amounts. HR increase; increased energy   . Sulfa Antibiotics Other (See Comments)    Elevated B/P, skin turns red   Social History   Tobacco Use  . Smoking status: Former Smoker    Packs/day: 0.25    Years: 40.00    Pack years: 10.00    Types: Cigarettes    Last attempt to quit: 03/07/2011    Years since quitting: 7.0  . Smokeless  tobacco: Never Used  Substance Use Topics  . Alcohol use: No    Alcohol/week: 0.0 standard drinks  . Drug use: No   Social History   Social History Narrative   Divorced woman -- Now Remarried to her long-term partner.   Exercises routinely on a daily basis roughly 45 minutes a day walking.  Quit smoking at the time of her MI. Does not drink.   Family History family history includes Alzheimer's disease in her father; Anxiety disorder in her mother; Bipolar disorder in her sister; Breast cancer (age of onset: 60) in her sister; Coronary artery disease in her mother; Depression in her brother and mother; Drug abuse in her brother; Healthy in her sister; Heart attack in her mother; Stroke in her father and mother.   Wt Readings from Last 3 Encounters:  03/21/18 176 lb (79.8 kg)  05/24/17 173 lb 6.4 oz (78.7 kg)  04/15/17 169 lb 9.6 oz (76.9 kg)    PHYSICAL EXAM BP 124/62 (BP Location: Left Arm, Patient Position: Sitting, Cuff Size: Normal)   Pulse (!) 57   Ht 5\' 6"  (1.676 m)   Wt 176 lb (79.8 kg)   BMI 28.41 kg/m  Physical Exam  Constitutional: She is oriented to person, place, and time. She appears well-developed and well-nourished. No distress.  Healthy-appearing.  Well-groomed.  In very good spirits  HENT:  Head: Normocephalic and atraumatic.  Neck: Normal range of motion. Neck supple. No hepatojugular reflux and no JVD present. Carotid bruit is not present.  Cardiovascular: Normal rate, regular rhythm, S1 normal, S2 normal and intact distal pulses.  No extrasystoles are present. PMI is not displaced. Exam reveals no gallop and no friction rub.  Murmur heard. High-pitched harsh crescendo-decrescendo midsystolic murmur is present with a grade of 1/6 at the upper right sternal border radiating to the neck. Pulmonary/Chest: Effort normal and breath sounds normal. No respiratory distress. She has no wheezes. She has no rales.  She has tenderness all along the lower 2 or 3 ribs.   From below her breast down to the floating rib costochondral margin.  Also tenderness following the ribs back to the back.  Abdominal: Soft. Bowel sounds are normal. She exhibits no distension. There is no abdominal tenderness. There is no rebound.  Mild truncal obesity; no HSM.  Musculoskeletal: Normal range of motion.  General: No edema.  Neurological: She is alert and oriented to person, place, and time.  Psychiatric: She has a normal mood and affect. Her behavior is normal. Judgment and thought content normal.  Much more positive outlook today.  Seems to be in good spirits.  Vitals reviewed.    Adult ECG Report Sinus bradycardia, rate 57 bpm.  Normal axis, intervals and durations.  Otherwise normal  Other studies Reviewed: Additional studies/ records that were reviewed today include:  Recent Labs:   Due for follow-up labs Lab Results  Component Value Date   CHOL 174 02/04/2017   HDL 93 02/04/2017   LDLCALC 68 02/04/2017   TRIG 63 02/04/2017   CHOLHDL 1.9 02/04/2017    ASSESSMENT / PLAN: Problem List Items Addressed This Visit    Benign essential HTN (Chronic)    Well-controlled on current meds.  No change.      Relevant Medications   aspirin EC 81 MG tablet   Other Relevant Orders   Comprehensive metabolic panel   CAD S/P PCI mRCA: Promus Element DES 3.5 mm x 28 mm (4.2-37 mm)) - Primary (Chronic)    1 stent in the RCA.  Now just over 7 years out from her MI.  At this point, I think we can probably stop Plavix. No further anginal symptoms.  Just encouraged her to get back into her routine exercise regimen again now that she has enjoyed her first year of retirement.  Plan:   DC Plavix and restart aspirin 81 mg.  Continue moderate dose beta-blocker and ACE inhibitor along with Repatha.      Relevant Medications   aspirin EC 81 MG tablet   Other Relevant Orders   EKG 12-Lead   Comprehensive metabolic panel   Hyperlipidemia with target LDL less than 70;  Statin Intolerant (Chronic)    Doing well on Repatha.  Is due for lab check now.  Last check was in October 2018. LDL was 68 on last check.  Would hope that would be lower than that next check.  Discussed importance of dietary modification.      Relevant Medications   aspirin EC 81 MG tablet   Other Relevant Orders   Lipid panel   Comprehensive metabolic panel   Overweight (BMI 25.0-29.9) (Chronic)    Thankfully, she really has not gained much weight throughout her year of being relatively sedentary.  Now she is open to get back active again.  Both she and her husband are in agreement that they need to get back out exercising.      Relevant Orders   Lipid panel   Presence of stent in right coronary artery (Chronic)   Relevant Orders   EKG 12-Lead   ST elevation myocardial infarction (STEMI) of inferior wall, subsequent episode of care [facility]) (Chronic)    Recovered well from her STEMI.  Is now 7 years out and doing well.  It took a little while to get over the psychological aspect of her MI, but is now doing very well and stable.  Preserved EF by echo with no heart failure symptoms.  No recurrent anginal symptoms now. On stable regimen.  Is far enough out to stop Plavix.      Relevant Medications   aspirin EC 81 MG tablet      Current medicines are reviewed at length with the patient today. (+/- concerns) n/a -- Loves Repatha results.  -Asked about Plavix. The following changes have been made: n/a  Patient Instructions  Medication Instructions:  CAN STOP TAKING PLAVIX (CLOPIDOGREL)  START TAKING ASPIRIN 81 MG ONE TABLET DAILY  If you need a refill on your cardiac medications before your next appointment, please call your pharmacy.   Lab work: LIPID CMP -- DON EAT OR DRINK THE MORNING OF THE TEST If you have labs (blood work) drawn today and your tests are completely normal, you will receive your results only by: Marland Kitchen MyChart Message (if you have MyChart) OR . A paper  copy in the mail If you have any lab test that is abnormal or we need to change your treatment, we will call you to review the results.  Testing/Procedures: NOT NEEDED  Follow-Up: At [facility], you and your health needs are our priority.  As part of our continuing mission to provide you with exceptional heart care, we have created designated Provider Care Teams.  These Care Teams include your primary Cardiologist (physician) and Advanced Practice Providers (APPs -  Physician Assistants and Nurse Practitioners) who all work together to provide you with the care you need, when you need it. You will need a follow up appointment in 12 months DEC 2020.  Please call our office 2 months in advance to schedule this appointment.  You may see Bryan Lemma, MD  or one of the following Advanced Practice Providers on your designated Care Team:   Theodore Demark, PA-C . Joni Reining, DNP, ANP  Any Other Special Instructions Will Be Listed Below (If Applicable).      Studies Ordered:   Orders Placed This Encounter  Procedures  . Lipid panel  . Comprehensive metabolic panel  . EKG 12-Lead      Bryan Lemma, M.D., M.S. Interventional Cardiologist   Pager # [phone] Phone # [phone] [address] [place], [place] [address]

## 2018-03-21 NOTE — Patient Instructions (Signed)
Medication Instructions:   CAN STOP TAKING PLAVIX (CLOPIDOGREL)  START TAKING ASPIRIN 81 MG ONE TABLET DAILY  If you need a refill on your cardiac medications before your next appointment, please call your pharmacy.   Lab work: LIPID CMP -- DON EAT OR DRINK THE MORNING OF THE TEST If you have labs (blood work) drawn today and your tests are completely normal, you will receive your results only by: Marland Kitchen. MyChart Message (if you have MyChart) OR . A paper copy in the mail If you have any lab test that is abnormal or we need to change your treatment, we will call you to review the results.  Testing/Procedures: NOT NEEDED  Follow-Up: At [facility], you and your health needs are our priority.  As part of our continuing mission to provide you with exceptional heart care, we have created designated Provider Care Teams.  These Care Teams include your primary Cardiologist (physician) and Advanced Practice Providers (APPs -  Physician Assistants and Nurse Practitioners) who all work together to provide you with the care you need, when you need it. You will need a follow up appointment in 12 months DEC 2020.  Please call our office 2 months in advance to schedule this appointment.  You may see Bryan Lemmaavid Harding, MD  or one of the following Advanced Practice Providers on your designated Care Team:   Theodore DemarkRhonda Barrett, PA-C . Joni ReiningKathryn Lawrence, DNP, ANP  Any Other Special Instructions Will Be Listed Below (If Applicable).

## 2018-03-23 ENCOUNTER — Encounter: Payer: Self-pay | Admitting: Cardiology

## 2018-03-23 NOTE — Assessment & Plan Note (Signed)
Recovered well from her STEMI.  Is now 7 years out and doing well.  It took a little while to get over the psychological aspect of her MI, but is now doing very well and stable.  Preserved EF by echo with no heart failure symptoms.  No recurrent anginal symptoms now. On stable regimen.  Is far enough out to stop Plavix.

## 2018-03-23 NOTE — Assessment & Plan Note (Addendum)
1 stent in the RCA.  Now just over 7 years out from her MI.  At this point, I think we can probably stop Plavix. No further anginal symptoms.  Just encouraged her to get back into her routine exercise regimen again now that she has enjoyed her first year of retirement.  Plan:   DC Plavix and restart aspirin 81 mg.  Continue moderate dose beta-blocker and ACE inhibitor along with Repatha.

## 2018-03-23 NOTE — Assessment & Plan Note (Signed)
Thankfully, she really has not gained much weight throughout her year of being relatively sedentary.  Now she is open to get back active again.  Both she and her husband are in agreement that they need to get back out exercising.

## 2018-03-23 NOTE — Assessment & Plan Note (Signed)
Well-controlled on current meds.  No change 

## 2018-03-23 NOTE — Assessment & Plan Note (Signed)
Doing well on Repatha.  Is due for lab check now.  Last check was in October 2018. LDL was 68 on last check.  Would hope that would be lower than that next check.  Discussed importance of dietary modification.

## 2018-03-25 ENCOUNTER — Telehealth: Payer: Self-pay | Admitting: Cardiology

## 2018-03-25 MED ORDER — BENAZEPRIL HCL 5 MG PO TABS: 5.0000 mg | ORAL_TABLET | Freq: Every day | ORAL | 2 refills | Status: DC

## 2018-03-25 NOTE — Telephone Encounter (Signed)
Rx request sent to pharmacy.  

## 2018-03-31 LAB — COMPREHENSIVE METABOLIC PANEL
ALT: 18 IU/L (ref 0–32)
AST: 18 IU/L (ref 0–40)
Albumin/Globulin Ratio: 2 (ref 1.2–2.2)
Albumin: 4.2 g/dL (ref 3.6–4.8)
Alkaline Phosphatase: 94 IU/L (ref 39–117)
BUN/Creatinine Ratio: 16 (ref 12–28)
BUN: 16 mg/dL (ref 8–27)
Bilirubin Total: 0.3 mg/dL (ref 0.0–1.2)
CALCIUM: 9.3 mg/dL (ref 8.7–10.3)
CO2: 25 mmol/L (ref 20–29)
CREATININE: 1.01 mg/dL — AB (ref 0.57–1.00)
Chloride: 101 mmol/L (ref 96–106)
GFR, EST AFRICAN AMERICAN: 69 mL/min/{1.73_m2} (ref >59)
GFR, EST NON AFRICAN AMERICAN: 60 mL/min/{1.73_m2} (ref >59)
GLUCOSE: 87 mg/dL (ref 65–99)
Globulin, Total: 2.1 g/dL (ref 1.5–4.5)
Potassium: 5.4 mmol/L — ABNORMAL HIGH (ref 3.5–5.2)
Sodium: 141 mmol/L (ref 134–144)
TOTAL PROTEIN: 6.3 g/dL (ref 6.0–8.5)

## 2018-03-31 LAB — LIPID PANEL
CHOL/HDL RATIO: 1.9 ratio (ref 0.0–4.4)
Cholesterol, Total: 177 mg/dL (ref 100–199)
HDL: 91 mg/dL (ref >39)
LDL CALC: 75 mg/dL (ref 0–99)
TRIGLYCERIDES: 54 mg/dL (ref 0–149)
VLDL Cholesterol Cal: 11 mg/dL (ref 5–40)

## 2018-04-01 ENCOUNTER — Other Ambulatory Visit: Payer: Self-pay

## 2018-04-01 DIAGNOSIS — E875 Hyperkalemia: Secondary | ICD-10-CM

## 2018-04-03 ENCOUNTER — Telehealth: Payer: Self-pay | Admitting: *Deleted

## 2018-04-03 LAB — BASIC METABOLIC PANEL
BUN / CREAT RATIO: 11 — AB (ref 12–28)
BUN: 12 mg/dL (ref 8–27)
CALCIUM: 9.4 mg/dL (ref 8.7–10.3)
CHLORIDE: 101 mmol/L (ref 96–106)
CO2: 25 mmol/L (ref 20–29)
Creatinine, Ser: 1.05 mg/dL — ABNORMAL HIGH (ref 0.57–1.00)
GFR calc non Af Amer: 57 mL/min/{1.73_m2} — ABNORMAL LOW (ref >59)
GFR, EST AFRICAN AMERICAN: 66 mL/min/{1.73_m2} (ref >59)
GLUCOSE: 84 mg/dL (ref 65–99)
Potassium: 5.2 mmol/L (ref 3.5–5.2)
Sodium: 141 mmol/L (ref 134–144)

## 2018-04-03 NOTE — Telephone Encounter (Signed)
Not sure what is causing the symptoms.  Occurring at rest would make me think it is not claudication.  She is not on a statin. May very well just be related to her needing to stretch and the fact that she is probably starting to do more exercise now.  Bryan Lemmaavid Koty Anctil, MD

## 2018-04-03 NOTE — Telephone Encounter (Signed)
Received patient walk in form [facility] to message left.    Per chart review-message was left to advise patient to repeat lab work due to elevated potassium.    Spoke to patient, she did have lab work completed this morning but also wanted to report that the back of her leg from her hip to her knee has been painful. Describes pain as a "cramping" sensation but states the muscle is not cramping.  She states this mostly occurs at rest.  She states she didn't think to mention this to Dr. Herbie BaltimoreHarding when he asked at her office visit.  Advised symptoms do not seem cardiac related and unsure if related to lab work.  Patient verbalized understanding but request this be sent to Dr. Herbie BaltimoreHarding to review.

## 2018-04-04 ENCOUNTER — Other Ambulatory Visit: Payer: Self-pay | Admitting: *Deleted

## 2018-04-04 DIAGNOSIS — E875 Hyperkalemia: Secondary | ICD-10-CM

## 2018-04-04 NOTE — Telephone Encounter (Signed)
Patient made aware.

## 2018-04-10 LAB — BASIC METABOLIC PANEL
BUN / CREAT RATIO: 12 (ref 12–28)
BUN: 13 mg/dL (ref 8–27)
CHLORIDE: 100 mmol/L (ref 96–106)
CO2: 25 mmol/L (ref 20–29)
Calcium: 9.6 mg/dL (ref 8.7–10.3)
Creatinine, Ser: 1.07 mg/dL — ABNORMAL HIGH (ref 0.57–1.00)
GFR calc Af Amer: 64 mL/min/{1.73_m2} (ref >59)
GFR calc non Af Amer: 56 mL/min/{1.73_m2} — ABNORMAL LOW (ref >59)
GLUCOSE: 74 mg/dL (ref 65–99)
POTASSIUM: 5 mmol/L (ref 3.5–5.2)
SODIUM: 139 mmol/L (ref 134–144)

## 2018-04-23 ENCOUNTER — Ambulatory Visit: Payer: Self-pay | Admitting: Psychiatry

## 2018-04-23 ENCOUNTER — Ambulatory Visit: Payer: BLUE CROSS/BLUE SHIELD | Admitting: Psychiatry

## 2018-04-23 ENCOUNTER — Encounter: Payer: Self-pay | Admitting: Psychiatry

## 2018-04-23 ENCOUNTER — Other Ambulatory Visit: Payer: Self-pay

## 2018-04-23 VITALS — BP 106/68 | HR 62 | Temp 98.0°F | Wt 177.2 lb

## 2018-04-23 DIAGNOSIS — F331 Major depressive disorder, recurrent, moderate: Secondary | ICD-10-CM | POA: Diagnosis not present

## 2018-04-23 MED ORDER — FLUOXETINE HCL 10 MG PO CAPS: 10.0000 mg | ORAL_CAPSULE | Freq: Every day | ORAL | 1 refills | Status: DC

## 2018-04-23 MED ORDER — BUPROPION HCL ER (XL) 300 MG PO TB24: 300.0000 mg | ORAL_TABLET | ORAL | 1 refills | Status: DC

## 2018-04-23 MED ORDER — FLUOXETINE HCL 40 MG PO CAPS: 40.0000 mg | ORAL_CAPSULE | Freq: Every day | ORAL | 1 refills | Status: DC

## 2018-04-23 NOTE — Progress Notes (Signed)
BH MD OP Progress Note  04/23/2018 5:22 PM Jessica Landry  MRN:  [id number]  Chief Complaint: ' I am here for follow up." Chief Complaint    Follow-up; Medication Refill     HPI: Jessica Landry is a 63 year old Caucasian female, married, on SSI, lives in [place], has a history of depression, presented to the clinic today for a follow-up visit.  This is patient's first visit with Clinical research associate.  Patient used to follow-up with Dr. Daleen Bo previously.  I have reviewed medical records in [facility] R. Per Dr. Daleen Bo dated 01/28/2018. 'per progress note- patient during that time was advised to continue Wellbutrin extended release 300 mg and Prozac 50 mg.  Patient at that time reported her insomnia as resolved.'  Patient today reports she continues to do well on the current medication regimen.  She does report she is going through an adjustment phase with her new life situations.  She reports she has to get adjusted to getting old.  She reports that sometimes worries her.  She reports she used to follow-up with a therapist previously however her therapist retired.  Patient also went to another therapist in the community however did not feel like it was a good fit for her.  Patient is interested in psychotherapy referral today.  Patient reports she is active in her church.  Patient denies any suicidality, homicidality or perceptual disturbances.  Patient denies any other concerns today. Visit Diagnosis:    ICD-10-CM   1. Major depressive disorder, recurrent episode, moderate (HCC) F33.1     Past Psychiatric History: Used to follow-up with Dr. Daleen Bo here in clinic.  Patient with history of depression.  Patient denies inpatient mental health admissions.  Patient denies suicide attempts.  Past Medical History:  Past Medical History:  Diagnosis Date  . Anxiety   . Arthritis    knees & back   . CAD S/P percutaneous coronary angioplasty 03/07/2011   a) PCI of mRCA with Promus Element DES 3.5 mm x 28 mm (4.2 -- 3.7  mm); b) Myoview 04/2011: EF 81% (small LV Cavity), fixed basal-mid Inferior Infarct, No Ischemia, 10 METS  . Complication of anesthesia   . Depression 03/08/2011   Dr. Leory Plowman- [place]  . Essential hypertension 03/07/2011  . Family history of adverse reaction to anesthesia   . GERD (gastroesophageal reflux disease)   . Headache    chocolate & red wine triggers /w bad headaches   . History of back surgery   . Hyperlipidemia with target LDL less than 70 2012    Statin Intolerance (tried Lipitor & Crestor)  . Insomnia    without significant sleeping disorders  . Iron deficiency anemia 03/09/2011  . Neuropathy   . PONV (postoperative nausea and vomiting)   . ST-segment elevation myocardial infarction (STEMI) of inferior wall (HCC) 03/07/2011   RCA Occlusion --> PCI of mRCA; ECHO 03/2011: EF > 55%, MIld Inferior HK, Mild AoV Sclerosis.  . Vocal cord polyp 2005    Past Surgical History:  Procedure Laterality Date  . ABDOMINAL HYSTERECTOMY    . APPENDECTOMY  07/04/2000   Normal appendix, adhesions noted.  Marland Kitchen BACK SURGERY     cyst on spinal cord - lumbar  . BREAST BIOPSY Right 01/31/1999   fibrocystic changes, ductal adenosis, focal atypical ductal epithelium.  Marland Kitchen CARDIAC CATHETERIZATION  03/08/2011   improved flow from the PCI  . CHOLECYSTECTOMY  03/25/1998   Dr. Lemar Livings, chronic cholecystitis.  . COLONOSCOPY WITH PROPOFOL N/A 09/28/2014   Procedure: COLONOSCOPY  WITH PROPOFOL;  Surgeon: Earline Mayotte, MD;  Location[facility] ENDOSCOPY;  Service: Endoscopy;  Laterality: N/A;  . CORONARY ANGIOPLASTY WITH STENT PLACEMENT  03/07/2011   inferior STEMI - RCA PROMUS 3.5 mm x 28 mm DES  (post Dil 3.7 distal to 4.2 prox)  . DENTAL SURGERY  2016  . DOPPLER ECHOCARDIOGRAPHY  03/27/2011   LV cavity is small,EF =>55%,MILD INFERIOR HYPOKINESIS; Mild Aortic Sclerosis  . ESOPHAGOGASTRODUODENOSCOPY N/A 09/28/2014   Procedure: ESOPHAGOGASTRODUODENOSCOPY (EGD);  Surgeon: Earline Mayotte, MD;   Location: [facility] ENDOSCOPY;  Service: Endoscopy;  Laterality: N/A;  . EYE SURGERY     blepheroplasty- bilateral  . JOINT REPLACEMENT    . KNEE SURGERY Left 2015  . KNEE SURGERY Right 2010   Baker cyst  . LEFT HEART CATHETERIZATION WITH CORONARY ANGIOGRAM N/A 03/07/2011   Procedure: LEFT HEART CATHETERIZATION WITH CORONARY ANGIOGRAM;  Surgeon: Marykay Lex, MD;  Location: [facility] CATH LAB;  Service: Cardiovascular;  Laterality: N/A;  . LEFT HEART CATHETERIZATION WITH CORONARY ANGIOGRAM N/A 03/08/2011   Procedure: LEFT HEART CATHETERIZATION WITH CORONARY ANGIOGRAM;  Surgeon: Marykay Lex, MD;  Location[facility] CATH LAB;  Service: Cardiovascular;  Laterality: N/A;  . NM MYOCAR PERF WALL MOTION  08/2016   Lexiscan: LOW RISK.  No ischemia or infarction.  Hyperdynamic LV function .  EF> 65%  . skin nodule Left 09/11/1999   dermatofibroma left lower leg, positive lateral margin.  Marland Kitchen TOTAL KNEE ARTHROPLASTY Bilateral 07/20/2015   Procedure: TOTAL KNEE BILATERAL;  Surgeon: Loreta Ave, MD;  Location: [facility] OR;  Service: Orthopedics;  Laterality: Bilateral;  . UPPER GI ENDOSCOPY  02-22-03   Dr. Lemar Livings, normal    Family Psychiatric History: Mother-depression-attempted suicide, brother- drug overdose, depression  Family History:  Family History  Problem Relation Age of Onset  . Heart attack Mother   . Coronary artery disease Mother   . Stroke Mother   . Depression Mother   . Anxiety disorder Mother   . Alzheimer's disease Father   . Stroke Father   . Breast cancer Sister 87  . Depression Brother   . Drug abuse Brother   . Healthy Sister   . Bipolar disorder Sister     Social History: Patient is married.  She lives in [place].  She is retired.  She has completed college.  She denies having children.  She is active in her church. Social History   Socioeconomic History  . Marital status: Married    Spouse name: Brett Canales  . Number of children: 0  . Years of education: 16  . Highest  education level: Bachelor's degree (e.g., BA, AB, BS)  Occupational History  . Occupation: unemployed  Social Needs  . Financial resource strain: Not on file  . Food insecurity:    Worry: Not on file    Inability: Not on file  . Transportation needs:    Medical: Not on file    Non-medical: Not on file  Tobacco Use  . Smoking status: Former Smoker    Packs/day: 0.25    Years: 40.00    Pack years: 10.00    Types: Cigarettes    Last attempt to quit: 03/07/2011    Years since quitting: 7.1  . Smokeless tobacco: Never Used  Substance and Sexual Activity  . Alcohol use: No    Alcohol/week: 0.0 standard drinks  . Drug use: No  . Sexual activity: Yes  Lifestyle  . Physical activity:    Days per week: Not on file  Minutes per session: Not on file  . Stress: Not on file  Relationships  . Social connections:    Talks on phone: Not on file    Gets together: Not on file    Attends religious service: Not on file    Active member of club or organization: Not on file    Attends meetings of clubs or organizations: Not on file    Relationship status: Not on file  Other Topics Concern  . Not on file  Social History Narrative   Divorced woman -- Now Remarried to her long-term partner.   Exercises routinely on a daily basis roughly 45 minutes a day walking.  Quit smoking at the time of her MI. Does not drink.    Allergies:  Allergies  Allergen Reactions  . Bee Venom Anaphylaxis  . Statins Other (See Comments)    Myalgias & fatigue  . Etodolac   . Prednisone Palpitations and Other (See Comments)    Can take small amounts. HR increase; increased energy   . Sulfa Antibiotics Other (See Comments)    Elevated B/P, skin turns red    Metabolic Disorder Labs: Lab Results  Component Value Date   HGBA1C 4.9 08/13/2016   MPG 94 08/13/2016   MPG 123 (H) 03/08/2011   No results found for: PROLACTIN Lab Results  Component Value Date   CHOL 177 03/31/2018   TRIG 54 03/31/2018    HDL 91 03/31/2018   CHOLHDL 1.9 03/31/2018   VLDL 14 08/14/2016   LDLCALC 75 03/31/2018   LDLCALC 68 02/04/2017   Lab Results  Component Value Date   TSH 2.830 09/29/2015   TSH 3.520 10/18/2014    Therapeutic Level Labs: No results found for: LITHIUM No results found for: VALPROATE No components found for:  CBMZ  Current Medications: Current Outpatient Medications  Medication Sig Dispense Refill  . aspirin EC 81 MG tablet Take 1 tablet (81 mg total) by mouth daily. 90 tablet 3  . benazepril (LOTENSIN) 5 MG tablet Take 1 tablet (5 mg total) by mouth daily before breakfast. 90 tablet 2  . buPROPion (WELLBUTRIN XL) 300 MG 24 hr tablet Take 1 tablet (300 mg total) by mouth every morning. 90 tablet 1  . Evolocumab (REPATHA SURECLICK) 140 MG/ML SOAJ Inject 140 mg into the skin every 14 (fourteen) days. 2 pen 11  . FLUoxetine (PROZAC) 10 MG capsule Take 1 capsule (10 mg total) by mouth daily. 90 capsule 1  . FLUoxetine (PROZAC) 40 MG capsule Take 1 capsule (40 mg total) by mouth daily before breakfast. 90 capsule 1  . gabapentin (NEURONTIN) 300 MG capsule Take 2 capsules (600 mg total) by mouth at bedtime. 180 capsule 3  . metoprolol tartrate (LOPRESSOR) 50 MG tablet TAKE 1 TABLET BY MOUTH TWICE DAILY. GENERIC EQUIVALENT FOR LOPRESSOR. 180 tablet 0  . nitroGLYCERIN (NITROSTAT) 0.4 MG SL tablet Place 1 tablet (0.4 mg total) under the tongue every 5 (five) minutes as needed for chest pain (up to 3 tabs in 15 mins and then call 911). 25 tablet 1  . omeprazole (PRILOSEC) 20 MG capsule TAKE 1 CAPSULE(20 MG) BY MOUTH DAILY 90 capsule 3   No current facility-administered medications for this visit.      Musculoskeletal: Strength & Muscle Tone: within normal limits Gait & Station: normal Patient leans: N/A  Psychiatric Specialty Exam: Review of Systems  Psychiatric/Behavioral: The patient is nervous/anxious.   All other systems reviewed and are negative.   Blood pressure 106/68, pulse  62, temperature 98 F (36.7 C), temperature source Oral, weight 177 lb 3.2 oz (80.4 kg).Body mass index is 28.6 kg/m.  General Appearance: Casual  Eye Contact:  Fair  Speech:  Normal Rate  Volume:  Normal  Mood:  Anxious  Affect:  Congruent  Thought Process:  Goal Directed and Descriptions of Associations: Intact  Orientation:  Full (Time, Place, and Person)  Thought Content: Logical   Suicidal Thoughts:  No  Homicidal Thoughts:  No  Memory:  Immediate;   Fair Recent;   Fair Remote;   Fair  Judgement:  Fair  Insight:  Fair  Psychomotor Activity:  Normal  Concentration:  Concentration: Fair and Attention Span: Fair  Recall:  FiservFair  Fund of Knowledge: Fair  Language: Fair  Akathisia:  No  Handed:  Right  AIMS (if indicated): denies tremors, rigidity  Assets:  Communication Skills Desire for Improvement Financial Resources/Insurance Social Support  ADL's:  Intact  Cognition: WNL  Sleep:  Fair   Screenings: PHQ2-9     [facility] Visit [facility] 03/11/2017 in [facility] Office Visit from 10/18/2014 in [facility]  PHQ-2 Total Score  0  4  PHQ-9 Total Score  -  14       Assessment and Plan: Maralyn SagoSarah is a 63 year old Caucasian female, married, retired, lives in [place], has a history of depression, presented to the clinic today for a follow-up visit.  Patient is biologically predisposed given her family history.  She is also having some adjustment issues getting used to her entire life.  Patient will benefit from psychotherapy visits.  Plan MDD-improving Continue Wellbutrin extended release 300 mg p.o. daily Prozac 50 mg p.o. daily.  I [facility] Per Dr. Daleen Boavi most recent 1 dated 01/28/2018- as noted above.  I have placed a referral for psychotherapy sessions with our therapist here in clinic.  I have ordered the following labs-TSH, vitamin B12.  Patient can go to lab core.  Follow-up in clinic in 3 months or sooner if  needed.  I have spent atleast 15 minutes face to face with patient today. More than 50 % of the time was spent for psychoeducation and supportive psychotherapy and care coordination.  This note was generated in part or whole with voice recognition software. Voice recognition is usually quite accurate but there are transcription errors that can and very often do occur. I apologize for any typographical errors that were not detected and corrected.        Jomarie LongsSaramma Adlai Sinning, MD 04/23/2018, 5:22 PM

## 2018-05-07 ENCOUNTER — Ambulatory Visit (INDEPENDENT_AMBULATORY_CARE_PROVIDER_SITE_OTHER): Payer: PRIVATE HEALTH INSURANCE | Admitting: Licensed Clinical Social Worker

## 2018-05-07 DIAGNOSIS — F331 Major depressive disorder, recurrent, moderate: Secondary | ICD-10-CM | POA: Diagnosis not present
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
## 2018-05-08 ENCOUNTER — Other Ambulatory Visit: Payer: Self-pay | Admitting: Psychiatry

## 2018-05-09 LAB — VITAMIN B12: Vitamin B-12: 420 pg/mL (ref 232–1245)

## 2018-05-09 LAB — TSH: TSH: 3.04 u[IU]/mL (ref 0.450–4.500)

## 2018-05-12 ENCOUNTER — Other Ambulatory Visit: Payer: Self-pay | Admitting: Cardiology

## 2018-05-12 NOTE — Telephone Encounter (Signed)
Rx request sent to pharmacy.  

## 2018-05-21 ENCOUNTER — Encounter: Payer: Self-pay | Admitting: Licensed Clinical Social Worker

## 2018-05-21 ENCOUNTER — Ambulatory Visit (INDEPENDENT_AMBULATORY_CARE_PROVIDER_SITE_OTHER): Payer: PRIVATE HEALTH INSURANCE | Admitting: Licensed Clinical Social Worker

## 2018-05-21 DIAGNOSIS — F331 Major depressive disorder, recurrent, moderate: Secondary | ICD-10-CM | POA: Diagnosis not present

## 2018-05-21 NOTE — Progress Notes (Signed)
   THERAPIST PROGRESS NOTE  Session Time: 1100-1150  Participation Level: Active  Behavioral Response: Well GroomedAlertDepressed  Type of Therapy: Individual Therapy  Treatment Goals addressed: Coping  Interventions: CBT  Summary: Jessica Landry is a 63 y.o. female who presents with continued symptoms of her diagnosis. [name] reports, "things have been okay. I'm not having one of my best days today." [name] reports feeling depressed over the last two weeks. She stated she was sick and "stayed in bed for a week. I was sleeping 20 hours a day." LCSW encouraged [name] to view that as doing what her body needed, and reframe the negative thoughts around that. This led to a discussion around CBT and challenging negative thoughts. [name] was able to understand the ideas presented and expressed agreement. [name] reported feeling depressed that she chose not to have a baby when she was younger, and reported she felt it was because "I gave my whole life to the horse world." LCSW asked [name] if there were other contributing factors to her decision not to have a child. She confirmed that she also never envisioned herself being pregnant. [name] was able to realize that she was focusing on one aspect of a decision rather than the whole picture. Additionally, [name] reported feeling uncomfortable when others compliment her or want to be around her at church. She said she has a difficult time understanding why they care for her. LCSW encouraged [name] to accept those feelings are true for the people saying the statements, and that eventually [name] will allow herself to believe it. This led to a discussion around validation. [name] expressed understanding and agreement.   Suicidal/Homicidal: No  Therapist Response: [name] was able to speak openly about her depression and how those symptoms are affecting her daily life. She was able to understand skills and concepts presented by LCSW and expressed understanding of how  they could positively impact her. [name] has not yet reached her tx goals, but we will continue working towards them utilizing CBT.   Plan: Return again in 2 weeks.  Diagnosis: Axis I: MDD (recurrent, moderate)    Axis II: No diagnosis    Heidi Dach, LCSW 05/21/2018

## 2018-06-04 ENCOUNTER — Ambulatory Visit (INDEPENDENT_AMBULATORY_CARE_PROVIDER_SITE_OTHER): Payer: PRIVATE HEALTH INSURANCE | Admitting: Licensed Clinical Social Worker

## 2018-06-04 ENCOUNTER — Encounter: Payer: Self-pay | Admitting: Licensed Clinical Social Worker

## 2018-06-04 DIAGNOSIS — F331 Major depressive disorder, recurrent, moderate: Secondary | ICD-10-CM

## 2018-06-04 NOTE — Progress Notes (Signed)
   THERAPIST PROGRESS NOTE  Session Time: 1100-1147  Participation Level: Active  Behavioral Response: Well GroomedAlertDepressed  Type of Therapy: Individual Therapy  Treatment Goals addressed: Coping  Interventions: CBT  Summary: Jessica Landry is a 63 y.o. female who presents with continued symptoms of her diagnosis. Jessica Landry reports things have gone well since our last session. She reports joining a book club through her church, which had their first meeting last night. She reported feeling very excited to be apart of the club, and that the organizer "is just wonderful. You can just feel her happiness and joy when you're around her." LCSW validated Jessica Landry in her efforts to be more social and nuture the relationships she has formed through her church affiliation. Jessica Landry went on to discuss the idea that her body is betraying her. She stated feeling very sad around the idea that she is unable to do the things she used to without her body "reminding me I'm old." Jessica Landry used an example of trying to climb stairs to meet for the book club and not being able to "power through them." LCSW encouraged Jessica Landry to reframe the negative thoughts around aging. For example, LCSW proposed Jessica Landry viewing things around age like she has earned the right to take her time up the stairs, or not work as hard throughout the day. Jessica Landry expressed understanding and agreement with this idea. She reported feeling guilty when allowing herself to sit on her couch. Because of this, LCSW asked Jessica Landry to make a to-do list, and assign herself one thing per day, and after that thing was done--she actively gave herself permission to sit on the couch with her dog (if that is what she wanted to do). Jessica Landry expressed agreement with this plan.   Suicidal/Homicidal: No  Therapist Response: Jessica Landry continues to work towards her tx goals but has not yet reached them. We are continuing to work on reframing negative thoughts and understanding the  connection between thoughts and emotions. We will continue to utilize CBT moving forward to continue improving Jessica Landry's emotional regulation skills and increase her ability to manage her depression in a meaningful way.   Plan: Return again in 2 weeks.  Diagnosis: Axis I: Major Depressive Disorder, recurrent, moderate    Axis II: No diagnosis    Heidi Dach, LCSW 06/04/2018

## 2018-06-09 ENCOUNTER — Other Ambulatory Visit: Payer: Self-pay | Admitting: Cardiology

## 2018-06-09 ENCOUNTER — Other Ambulatory Visit: Payer: Self-pay | Admitting: Family Medicine

## 2018-06-09 DIAGNOSIS — M9979 Connective tissue and disc stenosis of intervertebral foramina of abdomen and other regions: Secondary | ICD-10-CM

## 2018-06-09 NOTE — Telephone Encounter (Signed)
I think this lady might be your patient now.

## 2018-06-09 NOTE — Telephone Encounter (Signed)
Patient is overdue for CPE.  Please call and get her scheduled.

## 2018-06-10 NOTE — Telephone Encounter (Signed)
Patient advised. She states she loves [facility], but she is switching to [facility] Dr. Dareen Piano because her husband goes to him.

## 2018-06-24 ENCOUNTER — Ambulatory Visit: Payer: PRIVATE HEALTH INSURANCE | Admitting: Licensed Clinical Social Worker

## 2018-06-25 ENCOUNTER — Other Ambulatory Visit: Payer: Self-pay | Admitting: Internal Medicine

## 2018-06-25 DIAGNOSIS — Z1231 Encounter for screening mammogram for malignant neoplasm of breast: Secondary | ICD-10-CM

## 2018-07-23 ENCOUNTER — Encounter: Payer: Self-pay | Admitting: Psychiatry

## 2018-07-23 ENCOUNTER — Ambulatory Visit (INDEPENDENT_AMBULATORY_CARE_PROVIDER_SITE_OTHER): Payer: PRIVATE HEALTH INSURANCE | Admitting: Psychiatry

## 2018-07-23 ENCOUNTER — Other Ambulatory Visit: Payer: Self-pay

## 2018-07-23 DIAGNOSIS — F331 Major depressive disorder, recurrent, moderate: Secondary | ICD-10-CM | POA: Diagnosis not present

## 2018-07-23 DIAGNOSIS — F325 Major depressive disorder, single episode, in full remission: Secondary | ICD-10-CM | POA: Insufficient documentation

## 2018-07-23 NOTE — Progress Notes (Signed)
TC  On 07-23-18 @ 9:46 pt medical and surgical hx section was reviewed with no changes. Pt allergies were reviewed with no changes. Pt medications and pharmacy were reviewed and no changes. No vitals taken due to this is a phone visit.

## 2018-07-23 NOTE — Progress Notes (Signed)
Virtual Visit via Telephone Note  I connected with Jessica Landry on 07/23/18 at 11:00 AM EDT by telephone and verified that I am speaking with the correct person using two identifiers.   I discussed the limitations, risks, security and privacy concerns of performing an evaluation and management service by telephone and the availability of in person appointments. I also discussed with the patient that there may be a patient responsible charge related to this service. The patient expressed understanding and agreed to proceed.    I discussed the assessment and treatment plan with the patient. The patient was provided an opportunity to ask questions and all were answered. The patient agreed with the plan and demonstrated an understanding of the instructions.   The patient was advised to call back or seek an in-person evaluation if the symptoms worsen or if the condition fails to improve as anticipated.   BH MD OP Progress Note  07/23/2018 5:18 PM Jessica Landry  MRN:  [id number]  Chief Complaint:  Chief Complaint    Follow-up     HPI: Jessica Landry is a 63 yr old CF, married on SSI, lives in [place], has a history of MDD, was evaluated by phone today.  Patient today reports she is doing well with regards to her depression. She also reports anxiety symptoms as improved. She reports she and her husband are doing well and is coping OK with the Covid 19 outbreak. They are staying indoors mostly .  He reports she is enjoying this time with her husband by "spending quality time with him.  She spends a lot of time working in her garden and enjoys it.  She reports her husband has been doing most of the shopping.  She reports she does not feel much anxious or depressed about the COVID-19 outbreak and is able to follow all recommendations.  She reports she is tolerating medications well. She is compliant on them,.  She is alert , oriented to person, place and situation.  She denies any other  concerns.  Visit Diagnosis:    ICD-10-CM   1. Major depressive disorder, recurrent episode, moderate (HCC) F33.1    improving    Past Psychiatric History: Reviewed past psychiatric history from my progress note on 04/23/2018  Past Medical History:  Past Medical History:  Diagnosis Date  . Anxiety   . Arthritis    knees & back   . CAD S/P percutaneous coronary angioplasty 03/07/2011   a) PCI of mRCA with Promus Element DES 3.5 mm x 28 mm (4.2 -- 3.7 mm); b) Myoview 04/2011: EF 81% (small LV Cavity), fixed basal-mid Inferior Infarct, No Ischemia, 10 METS  . Complication of anesthesia   . Depression 03/08/2011   [place]. [place]  . Essential hypertension 03/07/2011  . Family history of adverse reaction to anesthesia   . GERD (gastroesophageal reflux disease)   . Headache    chocolate & red wine triggers /w bad headaches   . History of back surgery   . Hyperlipidemia with target LDL less than 70 2012    Statin Intolerance (tried Lipitor & Crestor)  . Insomnia    without significant sleeping disorders  . Iron deficiency anemia 03/09/2011  . Neuropathy   . PONV (postoperative nausea and vomiting)   . ST-segment elevation myocardial infarction (STEMI) of inferior wall (HCC) 03/07/2011   RCA Occlusion --> PCI of mRCA; ECHO 03/2011: EF > 55%, MIld Inferior HK, Mild AoV Sclerosis.  . Vocal cord polyp 2005  Past Surgical History:  Procedure Laterality Date  . ABDOMINAL HYSTERECTOMY    . APPENDECTOMY  07/04/2000   Normal appendix, adhesions noted.  Marland Kitchen BACK SURGERY     cyst on spinal cord - lumbar  . BREAST BIOPSY Right 01/31/1999   fibrocystic changes, ductal adenosis, focal atypical ductal epithelium.  Marland Kitchen CARDIAC CATHETERIZATION  03/08/2011   improved flow from the PCI  . CHOLECYSTECTOMY  03/25/1998   Dr. Lemar Livings, chronic cholecystitis.  . COLONOSCOPY WITH PROPOFOL N/A 09/28/2014   Procedure: COLONOSCOPY WITH PROPOFOL;  Surgeon: Earline Mayotte, MD;  [facility]  ENDOSCOPY;  Service: Endoscopy;  Laterality: N/A;  . CORONARY ANGIOPLASTY WITH STENT PLACEMENT  03/07/2011   inferior STEMI - RCA PROMUS 3.5 mm x 28 mm DES  (post Dil 3.7 distal to 4.2 prox)  . DENTAL SURGERY  2016  . DOPPLER ECHOCARDIOGRAPHY  03/27/2011   LV cavity is small,EF =>55%,MILD INFERIOR HYPOKINESIS; Mild Aortic Sclerosis  . ESOPHAGOGASTRODUODENOSCOPY N/A 09/28/2014   Procedure: ESOPHAGOGASTRODUODENOSCOPY (EGD);  Surgeon: Earline Mayotte, MD;  Location: [facility] ENDOSCOPY;  Service: Endoscopy;  Laterality: N/A;  . EYE SURGERY     blepheroplasty- bilateral  . JOINT REPLACEMENT    . KNEE SURGERY Left 2015  . KNEE SURGERY Right 2010   Baker cyst  . LEFT HEART CATHETERIZATION WITH CORONARY ANGIOGRAM N/A 03/07/2011   Procedure: LEFT HEART CATHETERIZATION WITH CORONARY ANGIOGRAM;  Surgeon: Marykay Lex, MD;  Location: [facility] CATH LAB;  Service: Cardiovascular;  Laterality: N/A;  . LEFT HEART CATHETERIZATION WITH CORONARY ANGIOGRAM N/A 03/08/2011   Procedure: LEFT HEART CATHETERIZATION WITH CORONARY ANGIOGRAM;  Surgeon: Marykay Lex, MD;  Location: [facility] CATH LAB;  Service: Cardiovascular;  Laterality: N/A;  . NM MYOCAR PERF WALL MOTION  08/2016   Lexiscan: LOW RISK.  No ischemia or infarction.  Hyperdynamic LV function .  EF> 65%  . skin nodule Left 09/11/1999   dermatofibroma left lower leg, positive lateral margin.  Marland Kitchen TOTAL KNEE ARTHROPLASTY Bilateral 07/20/2015   Procedure: TOTAL KNEE BILATERAL;  Surgeon: Loreta Ave, MD;  [facility] OR;  Service: Orthopedics;  Laterality: Bilateral;  . UPPER GI ENDOSCOPY  02-22-03   Dr. Lemar Livings, normal    Family Psychiatric History: I have reviewed family history from my progress note on 04/23/2018  Family History:  Family History  Problem Relation Age of Onset  . Heart attack Mother   . Coronary artery disease Mother   . Stroke Mother   . Depression Mother   . Anxiety disorder Mother   . Alzheimer's disease Father   . Stroke Father    . Breast cancer Sister 57  . Depression Brother   . Drug abuse Brother   . Healthy Sister   . Bipolar disorder Sister     Social History: I have reviewed social history from my progress note on 04/23/2018 Social History   Socioeconomic History  . Marital status: Married    Spouse name: Brett Canales  . Number of children: 0  . Years of education: 16  . Highest education level: Bachelor's degree (e.g., BA, AB, BS)  Occupational History  . Occupation: unemployed  Social Needs  . Financial resource strain: Not hard at all  . Food insecurity:    Worry: Never true    Inability: Never true  . Transportation needs:    Medical: No    Non-medical: No  Tobacco Use  . Smoking status: Former Smoker    Packs/day: 0.25    Years: 40.00  Pack years: 10.00    Types: Cigarettes    Last attempt to quit: 03/07/2011    Years since quitting: 7.3  . Smokeless tobacco: Never Used  Substance and Sexual Activity  . Alcohol use: No    Alcohol/week: 0.0 standard drinks  . Drug use: No  . Sexual activity: Yes  Lifestyle  . Physical activity:    Days per week: 0 days    Minutes per session: 0 min  . Stress: Not at all  Relationships  . Social connections:    Talks on phone: Not on file    Gets together: Not on file    Attends religious service: Never    Active member of club or organization: No    Attends meetings of clubs or organizations: Never    Relationship status: Married  Other Topics Concern  . Not on file  Social History Narrative   Divorced woman -- Now Remarried to her long-term partner.   Exercises routinely on a daily basis roughly 45 minutes a day walking.  Quit smoking at the time of her MI. Does not drink.    Allergies:  Allergies  Allergen Reactions  . Bee Venom Anaphylaxis  . Statins Other (See Comments)    Myalgias & fatigue  . Etodolac   . Prednisone Palpitations and Other (See Comments)    Can take small amounts. HR increase; increased energy   . Sulfa  Antibiotics Other (See Comments)    Elevated B/P, skin turns red    Metabolic Disorder Labs: Lab Results  Component Value Date   HGBA1C 4.9 08/13/2016   MPG 94 08/13/2016   MPG 123 (H) 03/08/2011   No results found for: PROLACTIN Lab Results  Component Value Date   CHOL 177 03/31/2018   TRIG 54 03/31/2018   HDL 91 03/31/2018   CHOLHDL 1.9 03/31/2018   VLDL 14 08/14/2016   LDLCALC 75 03/31/2018   LDLCALC 68 02/04/2017   Lab Results  Component Value Date   TSH 3.040 05/08/2018   TSH 2.830 09/29/2015    Therapeutic Level Labs: No results found for: LITHIUM No results found for: VALPROATE No components found for:  CBMZ  Current Medications: Current Outpatient Medications  Medication Sig Dispense Refill  . aspirin EC 81 MG tablet Take 1 tablet (81 mg total) by mouth daily. 90 tablet 3  . benazepril (LOTENSIN) 5 MG tablet Take 1 tablet (5 mg total) by mouth daily before breakfast. 90 tablet 2  . buPROPion (WELLBUTRIN XL) 300 MG 24 hr tablet Take 1 tablet (300 mg total) by mouth every morning. 90 tablet 1  . FLUoxetine (PROZAC) 10 MG capsule Take 1 capsule (10 mg total) by mouth daily. 90 capsule 1  . FLUoxetine (PROZAC) 40 MG capsule Take 1 capsule (40 mg total) by mouth daily before breakfast. 90 capsule 1  . gabapentin (NEURONTIN) 300 MG capsule TAKE 2 CAPSULES(600 MG) BY MOUTH AT BEDTIME 180 capsule 0  . metoprolol tartrate (LOPRESSOR) 50 MG tablet TAKE 1 TABLET BY MOUTH TWICE DAILY. GENERIC FOR LOPRESSOR 180 tablet 0  . nitroGLYCERIN (NITROSTAT) 0.4 MG SL tablet Place 1 tablet (0.4 mg total) under the tongue every 5 (five) minutes as needed for chest pain (up to 3 tabs in 15 mins and then call 911). 25 tablet 1  . omeprazole (PRILOSEC) 20 MG capsule TAKE 1 CAPSULE(20 MG) BY MOUTH DAILY 90 capsule 3  . REPATHA SURECLICK 140 MG/ML SOAJ INJECT 1 ML UNDER THE SKIN EVERY 14 DAYS 2 mL  6   No current facility-administered medications for this visit.       Musculoskeletal: Strength & Muscle Tone: UTA Gait & Station: UTA Patient leans: N/A  Psychiatric Specialty Exam: Review of Systems  Psychiatric/Behavioral: The patient is nervous/anxious (improving).   All other systems reviewed and are negative.   There were no vitals taken for this visit.There is no height or weight on file to calculate BMI.  General Appearance: UTA  Eye Contact:  UTA  Speech:  Normal Rate  Volume:  Normal  Mood:  Anxious  Affect:  UTA  Thought Process:  Goal Directed and Descriptions of Associations: Intact  Orientation:  Full (Time, Place, and Person)  Thought Content: Logical   Suicidal Thoughts:  No  Homicidal Thoughts:  No  Memory:  Immediate;   Fair Recent;   Fair Remote;   Fair  Judgement:  Fair  Insight:  Fair  Psychomotor Activity:  UTA  Concentration:  Concentration: Fair and Attention Span: Fair  Recall:  [employer] of Knowledge: Fair  Language: Fair  Akathisia:  No  Handed:  Right  AIMS (if indicated): Denies tremors , rigidity,stiffness  Assets:  Communication Skills Desire for Improvement Housing Social Support Transportation  ADL's:  Intact  Cognition: WNL  Sleep:  Fair   Screenings: PHQ2-9     Office Visit from 03/11/2017 in [facility] Office Visit from 10/18/2014 in [facility]  PHQ-2 Total Score  0  4  PHQ-9 Total Score  -  14       Assessment and Plan: Perris is a 63 yr old CF, married , retired , lives in [place], has a history of depression, was evaluated by phone today.Patient is biologically predisposed given her family history of mental health problems. Patient is currently doing well , will continues plan as noted below.  Plan  MDD - Stable Wellbutrin XL 300 mg PO daily. Prozac 50 mg PO daily.  I have reviewed labs - dated 05/08/2018 - TSH - 3.040 - wnl , VItamin b12- 420 - wnl.  Follow up in clinic in 3 months or sooner if needed.  I have spent atleast 15 minutes non  face to face with patient today. More than 50 % of the time was spent for psychoeducation and supportive psychotherapy and care coordination.   This note was generated in part or whole with voice recognition software. Voice recognition is usually quite accurate but there are transcription errors that can and very often do occur. I apologize for any typographical errors that were not detected and corrected.       Jomarie Longs, MD 07/23/2018, 5:18 PM

## 2018-08-05 ENCOUNTER — Telehealth: Payer: Self-pay | Admitting: Cardiology

## 2018-08-05 NOTE — Telephone Encounter (Signed)
  Osborne Casco with [facility] team is calling to verify we received the fax regarding authorization for Ms Simington's Repatha Sure Click  Script [id number]

## 2018-08-05 NOTE — Telephone Encounter (Signed)
Working on [employer]

## 2018-08-05 NOTE — Telephone Encounter (Signed)
Forward to [facility] 

## 2018-08-06 ENCOUNTER — Other Ambulatory Visit: Payer: Self-pay

## 2018-08-08 ENCOUNTER — Telehealth: Payer: Self-pay | Admitting: *Deleted

## 2018-08-08 ENCOUNTER — Other Ambulatory Visit: Payer: Self-pay

## 2018-08-08 ENCOUNTER — Telehealth: Payer: Self-pay | Admitting: Cardiology

## 2018-08-08 DIAGNOSIS — E785 Hyperlipidemia, unspecified: Secondary | ICD-10-CM

## 2018-08-08 NOTE — Telephone Encounter (Signed)
Working on it. I called [facility]. The key they provided didn't worked. I entered a renewal for her PA.

## 2018-08-08 NOTE — Telephone Encounter (Signed)
Opened in error

## 2018-08-08 NOTE — Telephone Encounter (Signed)
New Message           Patient is needing a authorization signed for " Repatha"coming from [employer]. Patient needs this signed ASAP!!!!This will be faxed to the main fax

## 2018-08-08 NOTE — Telephone Encounter (Signed)
Renea Ee from [facility] [phone] called to check on the status of prior approval for Repatha, reference number is [id number].  Thank you

## 2018-08-11 ENCOUNTER — Telehealth: Payer: Self-pay

## 2018-08-11 LAB — LIPID PANEL
Chol/HDL Ratio: 2.2 ratio (ref 0.0–4.4)
Cholesterol, Total: 175 mg/dL (ref 100–199)
HDL: 80 mg/dL (ref >39)
LDL Calculated: 84 mg/dL (ref 0–99)
Triglycerides: 56 mg/dL (ref 0–149)
VLDL Cholesterol Cal: 11 mg/dL (ref 5–40)

## 2018-08-11 NOTE — Telephone Encounter (Signed)
Please see previous phone message.  

## 2018-08-11 NOTE — Telephone Encounter (Signed)
Talked to patient today. Aware her new insurance DENIED prior-authorization renewal for Repatha d/t labs older than 3 month.  I am in process of appealing the decision.  Patient will stop by office today and repeat Lipid panle.  We will provide repatha 140mg  samples x2 as well.

## 2018-08-11 NOTE — Telephone Encounter (Signed)
Received message from pharmD regarding providing 2 boxes of Repatha 140 mg samples for pt. Samples placed in bag with pt name and DOB. Medication available for pick up by pt and is located in pharmD refrigerator.     Drug name: REPATHA      Strength: 140 MG/ML       Qty: 2 BOXES  LOT: [id number]  Exp.Date: 05/22  Dosing instructions:  INJECT 1 ML UNDER THE SKIN EVERY 14 DAYS

## 2018-08-11 NOTE — Addendum Note (Signed)
Addended by: Pearletha Furl on: 08/11/2018 09:27 AM   Modules accepted: Orders

## 2018-08-15 ENCOUNTER — Other Ambulatory Visit: Payer: Self-pay | Admitting: Cardiology

## 2018-08-15 NOTE — Telephone Encounter (Signed)
Lopressor 50 mg refilled. 

## 2018-08-20 ENCOUNTER — Other Ambulatory Visit: Payer: Self-pay

## 2018-08-26 ENCOUNTER — Other Ambulatory Visit: Payer: Self-pay | Admitting: Pharmacist

## 2018-08-26 MED ORDER — EVOLOCUMAB 140 MG/ML ~~LOC~~ SOAJ: 140.0000 mg | SUBCUTANEOUS | 6 refills | Status: DC

## 2018-08-26 NOTE — Telephone Encounter (Signed)
PA renewal for repatha approved today 5/19/202  Patient notified and refill sent to pharmacy

## 2018-08-27 ENCOUNTER — Telehealth: Payer: Self-pay

## 2018-09-05 ENCOUNTER — Telehealth: Payer: Self-pay | Admitting: Family Medicine

## 2018-09-05 DIAGNOSIS — M9979 Connective tissue and disc stenosis of intervertebral foramina of abdomen and other regions: Secondary | ICD-10-CM

## 2018-09-05 MED ORDER — GABAPENTIN 300 MG PO CAPS: 600.0000 mg | ORAL_CAPSULE | Freq: Every day | ORAL | 1 refills | Status: AC

## 2018-09-05 NOTE — Telephone Encounter (Signed)
Ok to send refill of 90 day supply with 1 refill

## 2018-09-05 NOTE — Telephone Encounter (Signed)
[facility] faxed refill request for the following medications:  gabapentin (NEURONTIN) 300 MG capsule  90 day supply  Please advise. Thanks TNP

## 2018-09-08 NOTE — Telephone Encounter (Signed)
Refills

## 2018-10-27 ENCOUNTER — Ambulatory Visit: Payer: PRIVATE HEALTH INSURANCE | Admitting: Psychiatry

## 2018-11-03 ENCOUNTER — Other Ambulatory Visit: Payer: Self-pay

## 2018-11-03 ENCOUNTER — Ambulatory Visit (INDEPENDENT_AMBULATORY_CARE_PROVIDER_SITE_OTHER): Payer: Self-pay | Admitting: Psychiatry

## 2018-11-03 ENCOUNTER — Encounter: Payer: Self-pay | Admitting: Psychiatry

## 2018-11-03 DIAGNOSIS — F331 Major depressive disorder, recurrent, moderate: Secondary | ICD-10-CM | POA: Insufficient documentation

## 2018-11-03 DIAGNOSIS — F3341 Major depressive disorder, recurrent, in partial remission: Secondary | ICD-10-CM

## 2018-11-03 MED ORDER — BUPROPION HCL ER (XL) 300 MG PO TB24: 300.0000 mg | ORAL_TABLET | ORAL | 1 refills | Status: DC

## 2018-11-03 MED ORDER — FLUOXETINE HCL 10 MG PO CAPS: 10.0000 mg | ORAL_CAPSULE | Freq: Every day | ORAL | 1 refills | Status: DC

## 2018-11-03 MED ORDER — FLUOXETINE HCL 40 MG PO CAPS: 40.0000 mg | ORAL_CAPSULE | Freq: Every day | ORAL | 1 refills | Status: DC

## 2018-11-03 NOTE — Progress Notes (Signed)
Virtual Visit via Telephone Note  I connected with Jessica Landry on 11/03/18 at 10:45 AM EDT by telephone and verified that I am speaking with the correct person using two identifiers.   I discussed the limitations, risks, security and privacy concerns of performing an evaluation and management service by telephone and the availability of in person appointments. I also discussed with the patient that there may be a patient responsible charge related to this service. The patient expressed understanding and agreed to proceed.   I discussed the assessment and treatment plan with the patient. The patient was provided an opportunity to ask questions and all were answered. The patient agreed with the plan and demonstrated an understanding of the instructions.   The patient was advised to call back or seek an in-person evaluation if the symptoms worsen or if the condition fails to improve as anticipated.   BH MD OP Progress Note  11/03/2018 5:49 PM Jessica BrookingSarah A Hardebeck  MRN:  [id number]  Chief Complaint:  Chief Complaint    Follow-up     HPI: Jessica Landry is a 63 year old Caucasian female, married, on SSI, lives in [place], has a history of MDD was evaluated by phone today.  Patient preferred to do a phone call.  Patient today reports she is currently doing well.  She denies any significant anxiety or depressive symptoms.  She reports she has been spending a lot of time with her church friends.  She reports that has been very helpful.  She reports sleep and mood is fair.  She reports appetite is good.  She is compliant on her Prozac as well as Wellbutrin.  She denies any side effects.  Patient appeared to be oriented to person place and situation.  Patient completed a PHQ-9 and scored 0 on the same.  It is likely her depression at this time is in remission.  This was discussed with patient.   Visit Diagnosis:    ICD-10-CM   1. MDD (major depressive disorder), recurrent, in partial remission  (HCC)  F33.41 buPROPion (WELLBUTRIN XL) 300 MG 24 hr tablet    FLUoxetine (PROZAC) 40 MG capsule    FLUoxetine (PROZAC) 10 MG capsule    Past Psychiatric History: I have reviewed past psychiatric history from my progress note on 04/23/2018.  Past Medical History:  Past Medical History:  Diagnosis Date  . Anxiety   . Arthritis    knees & back   . CAD S/P percutaneous coronary angioplasty 03/07/2011   a) PCI of mRCA with Promus Element DES 3.5 mm x 28 mm (4.2 -- 3.7 mm); b) Myoview 04/2011: EF 81% (small LV Cavity), fixed basal-mid Inferior Infarct, No Ischemia, 10 METS  . Complication of anesthesia   . Depression 03/08/2011   [place] [place]  . Essential hypertension 03/07/2011  . Family history of adverse reaction to anesthesia   . GERD (gastroesophageal reflux disease)   . Headache    chocolate & red wine triggers /w bad headaches   . History of back surgery   . Hyperlipidemia with target LDL less than 70 2012    Statin Intolerance (tried Lipitor & Crestor)  . Insomnia    without significant sleeping disorders  . Iron deficiency anemia 03/09/2011  . Neuropathy   . PONV (postoperative nausea and vomiting)   . ST-segment elevation myocardial infarction (STEMI) of inferior wall (HCC) 03/07/2011   RCA Occlusion --> PCI of mRCA; ECHO 03/2011: EF > 55%, MIld Inferior HK, Mild AoV Sclerosis.  . Vocal  cord polyp 2005    Past Surgical History:  Procedure Laterality Date  . ABDOMINAL HYSTERECTOMY    . APPENDECTOMY  07/04/2000   Normal appendix, adhesions noted.  Marland Kitchen BACK SURGERY     cyst on spinal cord - lumbar  . BREAST BIOPSY Right 01/31/1999   fibrocystic changes, ductal adenosis, focal atypical ductal epithelium.  Marland Kitchen CARDIAC CATHETERIZATION  03/08/2011   improved flow from the PCI  . CHOLECYSTECTOMY  03/25/1998   Dr. Bary Castilla, chronic cholecystitis.  . COLONOSCOPY WITH PROPOFOL N/A 09/28/2014   Procedure: COLONOSCOPY WITH PROPOFOL;  Surgeon: Robert Bellow, MD;   Location[facility] ENDOSCOPY;  Service: Endoscopy;  Laterality: N/A;  . CORONARY ANGIOPLASTY WITH STENT PLACEMENT  03/07/2011   inferior STEMI - RCA PROMUS 3.5 mm x 28 mm DES  (post Dil 3.7 distal to 4.2 prox)  . DENTAL SURGERY  2016  . DOPPLER ECHOCARDIOGRAPHY  03/27/2011   LV cavity is small,EF =>55%,MILD INFERIOR HYPOKINESIS; Mild Aortic Sclerosis  . ESOPHAGOGASTRODUODENOSCOPY N/A 09/28/2014   Procedure: ESOPHAGOGASTRODUODENOSCOPY (EGD);  Surgeon: Robert Bellow, MD;  Location: [facility] ENDOSCOPY;  Service: Endoscopy;  Laterality: N/A;  . EYE SURGERY     blepheroplasty- bilateral  . JOINT REPLACEMENT    . KNEE SURGERY Left 2015  . KNEE SURGERY Right 2010   Baker cyst  . LEFT HEART CATHETERIZATION WITH CORONARY ANGIOGRAM N/A 03/07/2011   Procedure: LEFT HEART CATHETERIZATION WITH CORONARY ANGIOGRAM;  Surgeon: Leonie Man, MD;  Location: [facility] CATH LAB;  Service: Cardiovascular;  Laterality: N/A;  . LEFT HEART CATHETERIZATION WITH CORONARY ANGIOGRAM N/A 03/08/2011   Procedure: LEFT HEART CATHETERIZATION WITH CORONARY ANGIOGRAM;  Surgeon: Leonie Man, MD;  Location: [facility] CATH LAB;  Service: Cardiovascular;  Laterality: N/A;  . NM MYOCAR PERF WALL MOTION  08/2016   Lexiscan: LOW RISK.  No ischemia or infarction.  Hyperdynamic LV function .  EF> 65%  . skin nodule Left 09/11/1999   dermatofibroma left lower leg, positive lateral margin.  Marland Kitchen TOTAL KNEE ARTHROPLASTY Bilateral 07/20/2015   Procedure: TOTAL KNEE BILATERAL;  Surgeon: Ninetta Lights, MD;  Location: [facility];  Service: Orthopedics;  Laterality: Bilateral;  . UPPER GI ENDOSCOPY  02-22-03   Dr. Bary Castilla, normal    Family Psychiatric History: I have reviewed family psychiatric history from my progress note on 04/23/2018.  Family History:  Family History  Problem Relation Age of Onset  . Heart attack Mother   . Coronary artery disease Mother   . Stroke Mother   . Depression Mother   . Anxiety disorder Mother   . Alzheimer's disease  Father   . Stroke Father   . Breast cancer Sister 69  . Depression Brother   . Drug abuse Brother   . Healthy Sister   . Bipolar disorder Sister     Social History: I have reviewed social history from my progress note on 04/23/2018. Social History   Socioeconomic History  . Marital status: Married    Spouse name: Richardson Landry  . Number of children: 0  . Years of education: 16  . Highest education level: Bachelor's degree (e.g., BA, AB, BS)  Occupational History  . Occupation: unemployed  Social Needs  . Financial resource strain: Not hard at all  . Food insecurity    Worry: Never true    Inability: Never true  . Transportation needs    Medical: No    Non-medical: No  Tobacco Use  . Smoking status: Former Smoker    Packs/day: 0.25  Years: 40.00    Pack years: 10.00    Types: Cigarettes    Quit date: 03/07/2011    Years since quitting: 7.6  . Smokeless tobacco: Never Used  Substance and Sexual Activity  . Alcohol use: No    Alcohol/week: 0.0 standard drinks  . Drug use: No  . Sexual activity: Yes  Lifestyle  . Physical activity    Days per week: 0 days    Minutes per session: 0 min  . Stress: Not at all  Relationships  . Social Musicianconnections    Talks on phone: Not on file    Gets together: Not on file    Attends religious service: Never    Active member of club or organization: No    Attends meetings of clubs or organizations: Never    Relationship status: Married  Other Topics Concern  . Not on file  Social History Narrative   Divorced woman -- Now Remarried to her long-term partner.   Exercises routinely on a daily basis roughly 45 minutes a day walking.  Quit smoking at the time of her MI. Does not drink.    Allergies:  Allergies  Allergen Reactions  . Bee Venom Anaphylaxis  . Statins Other (See Comments)    Myalgias & fatigue  . Etodolac   . Prednisone Palpitations and Other (See Comments)    Can take small amounts. HR increase; increased energy   .  Sulfa Antibiotics Other (See Comments)    Elevated B/P, skin turns red    Metabolic Disorder Labs: Lab Results  Component Value Date   HGBA1C 4.9 08/13/2016   MPG 94 08/13/2016   MPG 123 (H) 03/08/2011   No results found for: PROLACTIN Lab Results  Component Value Date   CHOL 175 08/11/2018   TRIG 56 08/11/2018   HDL 80 08/11/2018   CHOLHDL 2.2 08/11/2018   VLDL 14 08/14/2016   LDLCALC 84 08/11/2018   LDLCALC 75 03/31/2018   Lab Results  Component Value Date   TSH 3.040 05/08/2018   TSH 2.830 09/29/2015    Therapeutic Level Labs: No results found for: LITHIUM No results found for: VALPROATE No components found for:  CBMZ  Current Medications: Current Outpatient Medications  Medication Sig Dispense Refill  . aspirin EC 81 MG tablet Take 1 tablet (81 mg total) by mouth daily. 90 tablet 3  . benazepril (LOTENSIN) 5 MG tablet Take 1 tablet (5 mg total) by mouth daily before breakfast. 90 tablet 2  . buPROPion (WELLBUTRIN XL) 300 MG 24 hr tablet Take 1 tablet (300 mg total) by mouth every morning. 90 tablet 1  . Evolocumab (REPATHA SURECLICK) 140 MG/ML SOAJ Inject 140 mg into the skin every 14 (fourteen) days. 2 pen 6  . FLUoxetine (PROZAC) 10 MG capsule Take 1 capsule (10 mg total) by mouth daily. To be taken with 40 mg 90 capsule 1  . FLUoxetine (PROZAC) 40 MG capsule Take 1 capsule (40 mg total) by mouth daily before breakfast. To be taken with 10 mg 90 capsule 1  . gabapentin (NEURONTIN) 300 MG capsule Take 2 capsules (600 mg total) by mouth at bedtime. 180 capsule 1  . metoprolol tartrate (LOPRESSOR) 50 MG tablet TAKE 1 TABLET BY MOUTH TWICE DAILY 180 tablet 0  . nitroGLYCERIN (NITROSTAT) 0.4 MG SL tablet Place 1 tablet (0.4 mg total) under the tongue every 5 (five) minutes as needed for chest pain (up to 3 tabs in 15 mins and then call 911). 25 tablet 1  .  omeprazole (PRILOSEC) 20 MG capsule TAKE 1 CAPSULE(20 MG) BY MOUTH DAILY 90 capsule 3   No current  facility-administered medications for this visit.      Musculoskeletal: Strength & Muscle Tone: UTA Gait & Station: Reports as WNL Patient leans: N/A  Psychiatric Specialty Exam: Review of Systems  Psychiatric/Behavioral: The patient is not nervous/anxious.   All other systems reviewed and are negative.   There were no vitals taken for this visit.There is no height or weight on file to calculate BMI.  General Appearance: UTA  Eye Contact:  UTA  Speech:  Clear and Coherent  Volume:  Normal  Mood:  Euthymic  Affect:  UTA  Thought Process:  Goal Directed and Descriptions of Associations: Intact  Orientation:  Full (Time, Place, and Person)  Thought Content: Logical   Suicidal Thoughts:  No  Homicidal Thoughts:  No  Memory:  Immediate;   Fair Recent;   Fair Remote;   Fair  Judgement:  Fair  Insight:  Good  Psychomotor Activity:  UTA  Concentration:  Concentration: Fair and Attention Span: Fair  Recall:  [employer]Fair  Fund of Knowledge: Fair  Language: Fair  Akathisia:  No  Handed:  Right  AIMS (if indicated): Denies tremors, rigidity,stiffness  Assets:  Communication Skills Desire for Improvement Social Support  ADL's:  Intact  Cognition: WNL  Sleep:  Fair   Screenings: PHQ2-9     [facility]11[facility] Office Visit from 10/18/2014 in [facility]  PHQ-2 Total Score  0  4  PHQ-9 Total Score  -  14       Assessment and Plan: Jessica Landry is a 63 year old Caucasian female, married, retired, lives in [place], has a history of depression, was evaluated by phone today.  She is biologically predisposed given her family history of mental health problems.  Patient however currently is doing well and is compliant on medications.  Plan MDD- in partial remission Wellbutrin XL 300 mg p.o. daily. Prozac 50 mg p.o. daily. Patient [facility] medications sent to [facility].  Completed PHQ 9 screening questionnaire today in session-she  scored 0.  Follow-up in clinic in 3 to 4 months or sooner if needed.  November 2 at 10:45 in the morning.  I have spent atleast 15 minutes non face to face with patient today. More than 50 % of the time was spent for psychoeducation and supportive psychotherapy and care coordination.  This note was generated in part or whole with voice recognition software. Voice recognition is usually quite accurate but there are transcription errors that can and very often do occur. I apologize for any typographical errors that were not detected and corrected.          Jomarie LongsSaramma Adelisa Satterwhite, MD 11/03/2018, 5:49 PM

## 2018-11-09 ENCOUNTER — Other Ambulatory Visit: Payer: Self-pay | Admitting: Cardiology

## 2018-11-25 ENCOUNTER — Telehealth: Payer: Self-pay | Admitting: Cardiology

## 2018-11-25 NOTE — Telephone Encounter (Signed)
See note

## 2018-11-25 NOTE — Telephone Encounter (Signed)
I called the pharmacy and the insurance information are as follows: Id: [id number] Bin: [id number] Pcn: [id number] Grp: [id number] Tried to submit to cover my meds but they stated that eligibility wasn't found

## 2018-11-25 NOTE — Telephone Encounter (Signed)
New Message    Pt c/o medication issue:  1. Name of Medication: Repatha  2. How are you currently taking this medication (dosage and times per day)? 140mg /ml SOAJ Inject 140mg  into the skin every 14 days.  3. Are you having a reaction (difficulty breathing--STAT)? No  4. What is your medication issue? Patient's new insurance is [employer] and requires a prior authorization for medication

## 2018-11-25 NOTE — Telephone Encounter (Signed)
We have a letter from [facility] that states PA good until 09/2020.  Verify with [employer] that PA still good then call pharmacy - they will have to fix it on their end

## 2018-11-27 NOTE — Telephone Encounter (Signed)
Pharmacy called following up on this authorization please give them a call back.

## 2018-11-27 NOTE — Telephone Encounter (Signed)
Spoke with pharmacy, information regarding the PA given. They are going to call [employer].

## 2018-12-17 ENCOUNTER — Other Ambulatory Visit: Payer: Self-pay

## 2018-12-17 ENCOUNTER — Other Ambulatory Visit: Payer: Self-pay | Admitting: Pharmacist

## 2018-12-17 MED ORDER — REPATHA SURECLICK 140 MG/ML ~~LOC~~ SOAJ: 140.0000 mg | SUBCUTANEOUS | 3 refills | Status: DC

## 2018-12-25 ENCOUNTER — Other Ambulatory Visit: Payer: Self-pay

## 2018-12-25 MED ORDER — METOPROLOL TARTRATE 50 MG PO TABS: 50.0000 mg | ORAL_TABLET | Freq: Two times a day (BID) | ORAL | 0 refills | Status: DC

## 2018-12-31 ENCOUNTER — Other Ambulatory Visit: Payer: Self-pay

## 2019-01-06 ENCOUNTER — Ambulatory Visit
Admission: RE | Admit: 2019-01-06 | Discharge: 2019-01-06 | Disposition: A | Discharge status: Home / Self Care | Payer: 59 | Source: Ambulatory Visit | Attending: Internal Medicine | Admitting: Internal Medicine

## 2019-01-06 DIAGNOSIS — Z1231 Encounter for screening mammogram for malignant neoplasm of breast: Secondary | ICD-10-CM | POA: Diagnosis not present

## 2019-01-26 NOTE — Telephone Encounter (Signed)
Ok to fill  Jessica Hew, MD

## 2019-02-09 ENCOUNTER — Other Ambulatory Visit: Payer: Self-pay

## 2019-02-09 ENCOUNTER — Ambulatory Visit (INDEPENDENT_AMBULATORY_CARE_PROVIDER_SITE_OTHER): Payer: 59 | Admitting: Psychiatry

## 2019-02-09 ENCOUNTER — Encounter: Payer: Self-pay | Admitting: Psychiatry

## 2019-02-09 DIAGNOSIS — F3342 Major depressive disorder, recurrent, in full remission: Secondary | ICD-10-CM

## 2019-02-09 NOTE — Progress Notes (Signed)
Virtual Visit via Video Note  I connected with Jessica Landry on 02/09/19 at 10:45 AM EST by a video enabled telemedicine application and verified that I am speaking with the correct person using two identifiers.   I discussed the limitations of evaluation and management by telemedicine and the availability of in person appointments. The patient expressed understanding and agreed to proceed.    I discussed the assessment and treatment plan with the patient. The patient was provided an opportunity to ask questions and all were answered. The patient agreed with the plan and demonstrated an understanding of the instructions.   The patient was advised to call back or seek an in-person evaluation if the symptoms worsen or if the condition fails to improve as anticipated.   BH MD OP Progress Note  02/09/2019 3:29 PM Jessica Landry  MRN:  [id number]  Chief Complaint:  Chief Complaint    Follow-up     HPI: Jessica Landry is a 63 year old Caucasian female, married, SSI, lives in [place], has a history of MDD was evaluated by telemedicine today.  Patient today reports she is currently doing well with regards to her mood symptoms.  She denies any significant depression.  She reports sleep is good.  She reports appetite is fair.  She is compliant on medications as prescribed and denies side effects.  She appeared to be alert, oriented to person place time and situation.  Patient reports she is coping with the pandemic okay.  She spends time working on her crafts as well as helping her husband.  Patient denies any other concerns today. Visit Diagnosis:    ICD-10-CM   1. MDD (major depressive disorder), recurrent, in full remission (HCC)  F33.42     Past Psychiatric History: Reviewed past psychiatric history from my progress note on 04/23/2018  Past Medical History:  Past Medical History:  Diagnosis Date  . Anxiety   . Arthritis    knees & back   . CAD S/P percutaneous coronary  angioplasty 03/07/2011   a) PCI of mRCA with Promus Element DES 3.5 mm x 28 mm (4.2 -- 3.7 mm); b) Myoview 04/2011: EF 81% (small LV Cavity), fixed basal-mid Inferior Infarct, No Ischemia, 10 METS  . Complication of anesthesia   . Depression 03/08/2011   Dr. Leory Plowman- [place], [facility]  . Essential hypertension 03/07/2011  . Family history of adverse reaction to anesthesia   . GERD (gastroesophageal reflux disease)   . Headache    chocolate & red wine triggers /w bad headaches   . History of back surgery   . Hyperlipidemia with target LDL less than 70 2012    Statin Intolerance (tried Lipitor & Crestor)  . Insomnia    without significant sleeping disorders  . Iron deficiency anemia 03/09/2011  . Neuropathy   . PONV (postoperative nausea and vomiting)   . ST-segment elevation myocardial infarction (STEMI) of inferior wall (HCC) 03/07/2011   RCA Occlusion --> PCI of mRCA; ECHO 03/2011: EF > 55%, MIld Inferior HK, Mild AoV Sclerosis.  . Vocal cord polyp 2005    Past Surgical History:  Procedure Laterality Date  . ABDOMINAL HYSTERECTOMY    . APPENDECTOMY  07/04/2000   Normal appendix, adhesions noted.  Marland Kitchen BACK SURGERY     cyst on spinal cord - lumbar  . BREAST BIOPSY Right 01/31/1999   fibrocystic changes, ductal adenosis, focal atypical ductal epithelium.  Marland Kitchen BREAST EXCISIONAL BIOPSY    . CARDIAC CATHETERIZATION  03/08/2011   improved flow from the PCI  .  CHOLECYSTECTOMY  03/25/1998   Dr. Lemar LivingsByrnett, chronic cholecystitis.  . COLONOSCOPY WITH PROPOFOL N/A 09/28/2014   Procedure: COLONOSCOPY WITH PROPOFOL;  Surgeon: Earline MayotteJeffrey W Byrnett, MD;  [facility];  Service: Endoscopy;  Laterality: N/A;  . CORONARY ANGIOPLASTY WITH STENT PLACEMENT  03/07/2011   inferior STEMI - RCA PROMUS 3.5 mm x 28 mm DES  (post Dil 3.7 distal to 4.2 prox)  . DENTAL SURGERY  2016  . DOPPLER ECHOCARDIOGRAPHY  03/27/2011   LV cavity is small,EF =>55%,MILD INFERIOR HYPOKINESIS; Mild Aortic Sclerosis  .  ESOPHAGOGASTRODUODENOSCOPY N/A 09/28/2014   Procedure: ESOPHAGOGASTRODUODENOSCOPY (EGD);  Surgeon: Earline MayotteJeffrey W Byrnett, MD;  [facility];  Service: Endoscopy;  Laterality: N/A;  . EYE SURGERY     blepheroplasty- bilateral  . JOINT REPLACEMENT    . KNEE SURGERY Left 2015  . KNEE SURGERY Right 2010   Baker cyst  . LEFT HEART CATHETERIZATION WITH CORONARY ANGIOGRAM N/A 03/07/2011   Procedure: LEFT HEART CATHETERIZATION WITH CORONARY ANGIOGRAM;  Surgeon: Marykay Lexavid W Harding, MD;  [facility];  Service: Cardiovascular;  Laterality: N/A;  . LEFT HEART CATHETERIZATION WITH CORONARY ANGIOGRAM N/A 03/08/2011   Procedure: LEFT HEART CATHETERIZATION WITH CORONARY ANGIOGRAM;  Surgeon: Marykay Lexavid W Harding, MD;  [facility];  Service: Cardiovascular;  Laterality: N/A;  . NM MYOCAR PERF WALL MOTION  08/2016   Lexiscan: LOW RISK.  No ischemia or infarction.  Hyperdynamic LV function .  EF> 65%  . skin nodule Left 09/11/1999   dermatofibroma left lower leg, positive lateral margin.  Marland Kitchen. TOTAL KNEE ARTHROPLASTY Bilateral 07/20/2015   Procedure: TOTAL KNEE BILATERAL;  Surgeon: Loreta Aveaniel F Murphy, MD;  [facility];  Service: Orthopedics;  Laterality: Bilateral;  . UPPER GI ENDOSCOPY  02-22-03   Dr. Lemar LivingsByrnett, normal    Family Psychiatric History: Reviewed family psychiatric history from my progress note on 04/23/2018  Family History:  Family History  Problem Relation Age of Onset  . Heart attack Mother   . Coronary artery disease Mother   . Stroke Mother   . Depression Mother   . Anxiety disorder Mother   . Alzheimer's disease Father   . Stroke Father   . Breast cancer Sister [id number]  . Depression Brother   . Drug abuse Brother   . Healthy Sister   . Bipolar disorder Sister     Social History: Reviewed social history from my progress note on 04/23/2018 Social History   Socioeconomic History  . Marital status: Married    Spouse name: Brett CanalesSteve  . Number of children: 0  . Years of  education: 16  . Highest education level: Bachelor's degree (e.g., BA, AB, BS)  Occupational History  . Occupation: unemployed  Social Needs  . Financial resource strain: Not hard at all  . Food insecurity    Worry: Never true    Inability: Never true  . Transportation needs    Medical: No    Non-medical: No  Tobacco Use  . Smoking status: Former Smoker    Packs/day: 0.25    Years: 40.00    Pack years: 10.00    Types: Cigarettes    Quit date: 03/07/2011    Years since quitting: 7.9  . Smokeless tobacco: Never Used  Substance and Sexual Activity  . Alcohol use: No    Alcohol/week: 0.0 standard drinks  . Drug use: No  . Sexual activity: Yes  Lifestyle  . Physical activity    Days per week: 0 days    Minutes  per session: 0 min  . Stress: Not at all  Relationships  . Social Herbalist on phone: Not on file    Gets together: Not on file    Attends religious service: Never    Active member of club or organization: No    Attends meetings of clubs or organizations: Never    Relationship status: Married  Other Topics Concern  . Not on file  Social History Narrative   Divorced woman -- Now Remarried to her long-term partner.   Exercises routinely on a daily basis roughly 45 minutes a day walking.  Quit smoking at the time of her MI. Does not drink.    Allergies:  Allergies  Allergen Reactions  . Bee Venom Anaphylaxis  . Statins Other (See Comments)    Myalgias & fatigue  . Etodolac   . Prednisone Palpitations and Other (See Comments)    Can take small amounts. HR increase; increased energy   . Sulfa Antibiotics Other (See Comments)    Elevated B/P, skin turns red    Metabolic Disorder Labs: Lab Results  Component Value Date   HGBA1C 4.9 08/13/2016   MPG 94 08/13/2016   MPG 123 (H) 03/08/2011   No results found for: PROLACTIN Lab Results  Component Value Date   CHOL 175 08/11/2018   TRIG 56 08/11/2018   HDL 80 08/11/2018   CHOLHDL 2.2 08/11/2018    VLDL 14 08/14/2016   LDLCALC 84 08/11/2018   LDLCALC 75 03/31/2018   Lab Results  Component Value Date   TSH 3.040 05/08/2018   TSH 2.830 09/29/2015    Therapeutic Level Labs: No results found for: LITHIUM No results found for: VALPROATE No components found for:  CBMZ  Current Medications: Current Outpatient Medications  Medication Sig Dispense Refill  . aspirin EC 81 MG tablet Take 1 tablet (81 mg total) by mouth daily. 90 tablet 3  . benazepril (LOTENSIN) 5 MG tablet Take 1 tablet (5 mg total) by mouth daily before breakfast. 90 tablet 2  . buPROPion (WELLBUTRIN XL) 300 MG 24 hr tablet Take 1 tablet (300 mg total) by mouth every morning. 90 tablet 1  . Evolocumab (REPATHA SURECLICK) 224 MG/ML SOAJ Inject 140 mg into the skin every 14 (fourteen) days. 6 pen 3  . FLUoxetine (PROZAC) 10 MG capsule Take 1 capsule (10 mg total) by mouth daily. To be taken with 40 mg 90 capsule 1  . FLUoxetine (PROZAC) 40 MG capsule Take 1 capsule (40 mg total) by mouth daily before breakfast. To be taken with 10 mg 90 capsule 1  . fluticasone (FLONASE) 50 MCG/ACT nasal spray Place into the nose.    . gabapentin (NEURONTIN) 300 MG capsule Take 2 capsules (600 mg total) by mouth at bedtime. 180 capsule 1  . metoprolol tartrate (LOPRESSOR) 50 MG tablet Take 1 tablet (50 mg total) by mouth 2 (two) times daily. 180 tablet 0  . nitroGLYCERIN (NITROSTAT) 0.4 MG SL tablet Place 1 tablet (0.4 mg total) under the tongue every 5 (five) minutes as needed for chest pain (up to 3 tabs in 15 mins and then call 911). 25 tablet 1  . omeprazole (PRILOSEC) 20 MG capsule TAKE 1 CAPSULE(20 MG) BY MOUTH DAILY 90 capsule 3  . chlorhexidine (PERIDEX) 0.12 % solution     . SF 1.1 % GEL dental gel APP TID AFTER MEALS. DO NOT RINSE.SLS FREE WILL NOT FOAM     No current facility-administered medications for this visit.  Musculoskeletal: Strength & Muscle Tone: UTA Gait & Station: normal Patient leans:  N/A  Psychiatric Specialty Exam: Review of Systems  Psychiatric/Behavioral: Negative for depression, hallucinations and suicidal ideas. The patient is not nervous/anxious.   All other systems reviewed and are negative.   There were no vitals taken for this visit.There is no height or weight on file to calculate BMI.  General Appearance: Casual  Eye Contact:  Fair  Speech:  Clear and Coherent  Volume:  Normal  Mood:  Euthymic  Affect:  Congruent  Thought Process:  Goal Directed and Descriptions of Associations: Intact  Orientation:  Full (Time, Place, and Person)  Thought Content: Logical   Suicidal Thoughts:  No  Homicidal Thoughts:  No  Memory:  Immediate;   Fair Recent;   Fair Remote;   Fair  Judgement:  Fair  Insight:  Fair  Psychomotor Activity:  Normal  Concentration:  Concentration: Fair and Attention Span: Fair  Recall:  [employer] of Knowledge: Fair  Language: Fair  Akathisia:  No  Handed:  Right  AIMS (if indicated): Denies tremors, rigidity  Assets:  Communication Skills Desire for Improvement Housing Intimacy Social Support  ADL's:  Intact  Cognition: WNL  Sleep:  Fair   Screenings: PHQ2-9     Office Visit from 03/11/2017 in [facility] Office Visit from 10/18/2014 in [facility]  PHQ-2 Total Score  0  4  PHQ-9 Total Score  -  14       Assessment and Plan: Jessica Landry is a 63 year old Caucasian female, married, retired, lives in [place], has a history of depression was evaluated by telemedicine today.  She is biologically predisposed given her family history of mental health problems.  She is currently doing well plan as noted below.  Plan   MDD-in remission Wellbutrin XL 300 mg p.o. daily Prozac 50 mg p.o. daily  Follow-up in clinic in 3 to 6 months or sooner if needed.  March 18 at 10:30 AM  I have spent atleast 10 minutes non  face to face with patient today. More than 50 % of the time was spent for psychoeducation  and supportive psychotherapy and care coordination. This note was generated in part or whole with voice recognition software. Voice recognition is usually quite accurate but there are transcription errors that can and very often do occur. I apologize for any typographical errors that were not detected and corrected.       Jomarie Longs, MD 02/09/2019, 3:29 PM

## 2019-02-20 ENCOUNTER — Other Ambulatory Visit: Payer: Self-pay | Admitting: Cardiology

## 2019-02-27 ENCOUNTER — Telehealth: Payer: Self-pay | Admitting: Cardiology

## 2019-02-27 NOTE — Telephone Encounter (Signed)
Lm to call back ./cy 

## 2019-02-27 NOTE — Telephone Encounter (Signed)
Patient calling the office for samples of medication:   1.  What medication and dosage are you requesting samples for? metoprolol tartrate (LOPRESSOR) 50 MG tablet  2.  Are you currently out of this medication? No, has enough for 7 more days.   Patient lives in [place] and want's to know if the samples can be picked up at the [place] office instead of her having to drive to [place].

## 2019-03-02 MED ORDER — METOPROLOL TARTRATE 50 MG PO TABS: 50.0000 mg | ORAL_TABLET | Freq: Two times a day (BID) | ORAL | 0 refills | Status: DC

## 2019-03-02 NOTE — Telephone Encounter (Signed)
Spoke with pt, she needs a refill on the metoprolol to last until her appointment 12/14. 3 month refill sent to [facility]

## 2019-03-02 NOTE — Telephone Encounter (Signed)
Patient is returning phone call.  °

## 2019-03-11 ENCOUNTER — Other Ambulatory Visit: Payer: Self-pay | Admitting: Psychiatry

## 2019-03-11 DIAGNOSIS — F3341 Major depressive disorder, recurrent, in partial remission: Secondary | ICD-10-CM

## 2019-03-23 ENCOUNTER — Encounter: Payer: Self-pay | Admitting: Cardiology

## 2019-03-23 ENCOUNTER — Other Ambulatory Visit: Payer: Self-pay

## 2019-03-23 ENCOUNTER — Ambulatory Visit: Payer: 59 | Admitting: Cardiology

## 2019-03-23 VITALS — BP 120/73 | HR 56 | Temp 97.3°F | Ht 66.0 in | Wt 168.0 lb

## 2019-03-23 DIAGNOSIS — I2119 ST elevation (STEMI) myocardial infarction involving other coronary artery of inferior wall: Secondary | ICD-10-CM | POA: Diagnosis not present

## 2019-03-23 DIAGNOSIS — I2510 Atherosclerotic heart disease of native coronary artery without angina pectoris: Secondary | ICD-10-CM

## 2019-03-23 DIAGNOSIS — Z955 Presence of coronary angioplasty implant and graft: Secondary | ICD-10-CM

## 2019-03-23 DIAGNOSIS — Z9861 Coronary angioplasty status: Secondary | ICD-10-CM

## 2019-03-23 DIAGNOSIS — I10 Essential (primary) hypertension: Secondary | ICD-10-CM

## 2019-03-23 DIAGNOSIS — E785 Hyperlipidemia, unspecified: Secondary | ICD-10-CM

## 2019-03-23 MED ORDER — METOPROLOL TARTRATE 50 MG PO TABS: 50.0000 mg | ORAL_TABLET | Freq: Two times a day (BID) | ORAL | 3 refills | Status: DC

## 2019-03-23 NOTE — Patient Instructions (Signed)
Medication Instructions:  NO CHANGES *If you need a refill on your cardiac medications before your next appointment, please call your pharmacy*  Lab Work: LIPIDS CMP If you have labs (blood work) drawn today and your tests are completely normal, you will receive your results only by: Marland Kitchen MyChart Message (if you have MyChart) OR . A paper copy in the mail If you have any lab test that is abnormal or we need to change your treatment, we will call you to review the results.  Testing/Procedures: Not needed  Follow-Up: At [facility], you and your health needs are our priority.  As part of our continuing mission to provide you with exceptional heart care, we have created designated Provider Care Teams.  These Care Teams include your primary Cardiologist (physician) and Advanced Practice Providers (APPs -  Physician Assistants and Nurse Practitioners) who all work together to provide you with the care you need, when you need it.  Your next appointment:   12 month(s)  The format for your next appointment:   In Person  Provider:   Glenetta Hew, MD  Other Instructions

## 2019-03-23 NOTE — Progress Notes (Signed)
Primary Care Provider: Lauro RegulusAnderson, Marshall W, MD Cardiologist: Bryan Lemmaavid Eloise Picone, MD Electrophysiologist:   Clinic Note: Chief Complaint  Patient presents with  . Follow-up    Annual  . Coronary Artery Disease    Not very active, but no chest pain or pressure with rest or exertion.  . Hyperlipidemia    Now on Repatha     HPI:    Jessica Landry is a 63 y.o. female with a PMH notable for history of inferior STEMI with RCA PCI followed by transient episodes of intermittent syncope/near syncope, who presents today for annual follow-up.  Jessica Landry was last seen on March 21, 2018-she was doing very well.  Was 1 year out from retiring and noted that she had gotten quite sedentary.  Some exertional dyspnea from deconditioning.  No chest pain or pressure.  Walking was much improved having had both knee replacement surgeries done.  No longer having nagging chest discomfort. -->  Not doing routine exercise, but was doing yard work, Deal[place] lawn and gardening. -> Tolerating Repatha; recommended stopping the clopidogrel and just taking baby aspirin.  Recent Hospitalizations: None  Reviewed  CV studies:    The following studies were reviewed today: (if available, images/films reviewed: From Epic Chart or Care Everywhere) . None:   Interval History:   Jessica Landry returns today actually really doing well.  No major issues.  She is in great spirits.  She only wishes the had not allowed herself to get "lazy "without exercise.  She denies any significant exertional dyspnea or chest tightness/pressure.  She just has her rare intermittent nagging chest discomfort is more up actually in the left lower left lateral chest/ discomfort.  The description seems relatively localized but more musculoskeletal or GI in nature..  Off and on rare palpitations.  No profound resting exertional chest pain or pressure.  No heart failure symptoms or significant edema.  Trying to increase her  energy/exercise level.  Gradually picking up, she does.  Is hoping to keep trying to lose more weight.  She has lost somewhere between 5 to 8 pounds since last year.  She is able to walk more but has not really doing any routine exercise.  Knees being replaced certainly helped her ability to walk.  CV Review of Symptoms (Summary): no chest pain or dyspnea on exertion positive for - chest pain, palpitations and Relatively rare nagging sharp chest pains and even more infrequent palpitations/irregular beats. negative for - edema, orthopnea, paroxysmal nocturnal dyspnea, rapid heart rate, shortness of breath or Syncope/near syncope, TIA/amaurosis fugax, claudication  The patient does not have symptoms concerning for COVID-19 infection (fever, chills, cough, or new shortness of breath).  The patient is practicing social distancing.  She is vigilant about masking.  Not really going out for routine groceries/shopping.  Currently retired from work   REVIEWED OF SYSTEMS   A comprehensive ROS was performed. Review of Systems  Constitutional: Negative for malaise/fatigue and weight loss.  HENT: Negative for congestion and nosebleeds.   Respiratory: Negative for shortness of breath (Per HPI).   Cardiovascular: Negative for leg swelling.  Gastrointestinal: Negative for abdominal pain, blood in stool and melena.  Genitourinary: Negative for hematuria.  Musculoskeletal: Positive for back pain and joint pain. Negative for falls (No recent falls, but has had falls within the last year.) and myalgias.  Neurological: Positive for dizziness (Rare dizziness, just poor balance).       Poor balance.  Has some depth perception issues.  Unsteady  gait.  Psychiatric/Behavioral: Positive for depression (Fairly well treated now with combination of Wellbutrin and as needed Valium.  Seems to be tolerating well.). Negative for memory loss. The patient does not have insomnia (Sleeping much better).   All other systems  reviewed and are negative.   I have reviewed and (if needed) personally updated the patient's problem list, medications, allergies, past medical and surgical history, social and family history.   PAST MEDICAL HISTORY   Past Medical History:  Diagnosis Date  . Anxiety   . Arthritis    knees & back   . CAD S/P percutaneous coronary angioplasty 03/07/2011   a) PCI of mRCA with Promus Element DES 3.5 mm x 28 mm (4.2 -- 3.7 mm); b) Myoview 04/2011: EF 81% (small LV Cavity), fixed basal-mid Inferior Infarct, No Ischemia, 10 METS  . Complication of anesthesia   . Depression 03/08/2011   Dr. Leory Plowman- [place], [facility] [address]  . Essential hypertension 03/07/2011  . Family history of adverse reaction to anesthesia   . GERD (gastroesophageal reflux disease)   . Headache    chocolate & red wine triggers /w bad headaches   . History of back surgery   . Hyperlipidemia with target LDL less than 70 2012    Statin Intolerance (tried Lipitor & Crestor)  . Insomnia    without significant sleeping disorders  . Iron deficiency anemia 03/09/2011  . Neuropathy   . PONV (postoperative nausea and vomiting)   . ST-segment elevation myocardial infarction (STEMI) of inferior wall (HCC) 03/07/2011   RCA Occlusion --> PCI of mRCA; ECHO 03/2011: EF > 55%, MIld Inferior HK, Mild AoV Sclerosis.  . Vocal cord polyp 2005     PAST SURGICAL HISTORY   Past Surgical History:  Procedure Laterality Date  . ABDOMINAL HYSTERECTOMY    . APPENDECTOMY  07/04/2000   Normal appendix, adhesions noted.  Marland Kitchen BACK SURGERY     cyst on spinal cord - lumbar  . BREAST BIOPSY Right 01/31/1999   fibrocystic changes, ductal adenosis, focal atypical ductal epithelium.  Marland Kitchen BREAST EXCISIONAL BIOPSY    . CARDIAC CATHETERIZATION  03/08/2011   improved flow from the PCI  . CHOLECYSTECTOMY  03/25/1998   Dr. Lemar Livings, chronic cholecystitis.  . COLONOSCOPY WITH PROPOFOL N/A 09/28/2014   Procedure: COLONOSCOPY WITH PROPOFOL;  Surgeon: Earline Mayotte, MD;  Location: [facility] ENDOSCOPY;  Service: Endoscopy;  Laterality: N/A;  . CORONARY ANGIOPLASTY WITH STENT PLACEMENT  03/07/2011   inferior STEMI - RCA PROMUS 3.5 mm x 28 mm DES  (post Dil 3.7 distal to 4.2 prox)  . DENTAL SURGERY  2016  . DOPPLER ECHOCARDIOGRAPHY  03/27/2011   LV cavity is small,EF =>55%,MILD INFERIOR HYPOKINESIS; Mild Aortic Sclerosis  . ESOPHAGOGASTRODUODENOSCOPY N/A 09/28/2014   Procedure: ESOPHAGOGASTRODUODENOSCOPY (EGD);  Surgeon: Earline Mayotte, MD;  Location: [facility] ENDOSCOPY;  Service: Endoscopy;  Laterality: N/A;  . EYE SURGERY     blepheroplasty- bilateral  . JOINT REPLACEMENT    . KNEE SURGERY Left 2015  . KNEE SURGERY Right 2010   Baker cyst  . LEFT HEART CATHETERIZATION WITH CORONARY ANGIOGRAM N/A 03/07/2011   Procedure: LEFT HEART CATHETERIZATION WITH CORONARY ANGIOGRAM;  Surgeon: Marykay Lex, MD;  Location: [facility] CATH LAB;  Service: Cardiovascular;  Laterality: N/A;  . LEFT HEART CATHETERIZATION WITH CORONARY ANGIOGRAM N/A 03/08/2011   Procedure: LEFT HEART CATHETERIZATION WITH CORONARY ANGIOGRAM;  Surgeon: Marykay Lex, MD;  Location: [facility] CATH LAB;  Service: Cardiovascular;  Laterality: N/A;  . NM MYOCAR PERF WALL  MOTION  08/2016   Lexiscan: LOW RISK.  No ischemia or infarction.  Hyperdynamic LV function .  EF> 65%  . skin nodule Left 09/11/1999   dermatofibroma left lower leg, positive lateral margin.  Marland Kitchen TOTAL KNEE ARTHROPLASTY Bilateral 07/20/2015   Procedure: TOTAL KNEE BILATERAL;  Surgeon: Loreta Ave, MD;  Location: [facility] OR;  Service: Orthopedics;  Laterality: Bilateral;  . UPPER GI ENDOSCOPY  02-22-03   Dr. Lemar Livings, normal     MEDICATIONS/ALLERGIES   Current Meds  Medication Sig  . amoxicillin (AMOXIL) 500 MG capsule   . aspirin EC 81 MG tablet Take 1 tablet (81 mg total) by mouth daily.  . benazepril (LOTENSIN) 5 MG tablet Take 1 tablet (5 mg total) by mouth daily before breakfast.  . buPROPion (WELLBUTRIN XL) 300 MG 24 hr tablet  TAKE 1 TABLET EVERY MORNING  . chlorhexidine (PERIDEX) 0.12 % solution   . diazepam (VALIUM) 5 MG tablet   . Evolocumab (REPATHA SURECLICK) 140 MG/ML SOAJ Inject 140 mg into the skin every 14 (fourteen) days.  Marland Kitchen FLUoxetine (PROZAC) 10 MG capsule TAKE 1 CAPSULE EVERY DAY  WITH    . FLUoxetine (PROZAC) 40 MG capsule TAKE 1 CAPSULE EVERY DAY BEFORE BREAKFAST  WITH    CAP  . fluticasone (FLONASE) 50 MCG/ACT nasal spray Place into the nose.  . gabapentin (NEURONTIN) 300 MG capsule Take 2 capsules (600 mg total) by mouth at bedtime.  . metoprolol tartrate (LOPRESSOR) 50 MG tablet Take 1 tablet (50 mg total) by mouth 2 (two) times daily.  . nitroGLYCERIN (NITROSTAT) 0.4 MG SL tablet Place 1 tablet (0.4 mg total) under the tongue every 5 (five) minutes as needed for chest pain (up to 3 tabs in 15 mins and then call 911).  Marland Kitchen omeprazole (PRILOSEC) 20 MG capsule TAKE 1 CAPSULE(20 MG) BY MOUTH DAILY  . SF 1.1 % GEL dental gel APP TID AFTER MEALS. DO NOT RINSE.SLS FREE WILL NOT FOAM  . [DISCONTINUED] metoprolol tartrate (LOPRESSOR) 50 MG tablet Take 1 tablet (50 mg total) by mouth 2 (two) times daily.    Allergies  Allergen Reactions  . Bee Venom Anaphylaxis  . Statins Other (See Comments)    Myalgias & fatigue  . Etodolac   . Prednisone Palpitations and Other (See Comments)    Can take small amounts. HR increase; increased energy   . Sulfa Antibiotics Other (See Comments)    Elevated B/P, skin turns red    SOCIAL HISTORY/FAMILY HISTORY   Social History   Tobacco Use  . Smoking status: Former Smoker    Packs/day: 0.25    Years: 40.00    Pack years: 10.00    Types: Cigarettes    Quit date: 03/07/2011    Years since quitting: 8.0  . Smokeless tobacco: Never Used  Substance Use Topics  . Alcohol use: No    Alcohol/week: 0.0 standard drinks  . Drug use: No   Social History   Social History Narrative   Divorced woman -- Now Remarried to her long-term partner.   Exercises  routinely on a daily basis roughly 45 minutes a day walking.  Quit smoking at the time of her MI. Does not drink.    Family History family history includes Alzheimer's disease in her father; Anxiety disorder in her mother; Bipolar disorder in her sister; Breast cancer (age of onset: 72) in her sister; Coronary artery disease in her mother; Depression in her brother and mother; Drug abuse in her brother; Healthy in her  sister; Heart attack in her mother; Stroke in her father and mother.   OBJCTIVE -PE, EKG, labs   Wt Readings from Last 3 Encounters:  03/23/19 168 lb (76.2 kg)  03/21/18 176 lb (79.8 kg)  05/24/17 173 lb 6.4 oz (78.7 kg)    Physical Exam: BP 120/73   Pulse (!) 56   Temp (!) 97.3 F (36.3 C)   Ht 5\' 6"  (1.676 m)   Wt 168 lb (76.2 kg)   SpO2 99%   BMI 27.12 kg/m  Physical Exam  Constitutional: She is oriented to person, place, and time. She appears well-developed and well-nourished. No distress.  Healthy-appearing.  Well-groomed.  HENT:  Head: Normocephalic and atraumatic.  Eyes: EOM are normal.  Neck: No JVD present.  Cardiovascular: Normal rate, regular rhythm and intact distal pulses. Exam reveals no gallop and no friction rub.  Murmur (High-pitched 1/6C-D SEM at RUSB-neck.) heard. Pulmonary/Chest: Effort normal and breath sounds normal. No respiratory distress. She has no wheezes. She has no rales. She exhibits tenderness (Still has tenderness along the left lower ribs.).  Abdominal: Soft. Bowel sounds are normal. She exhibits no distension. There is no abdominal tenderness. There is no rebound and no guarding.  No HSM  Musculoskeletal:        General: No deformity or edema. Normal range of motion.     Cervical back: Normal range of motion and neck supple.  Neurological: She is alert and oriented to person, place, and time.  Skin: Skin is warm and dry.  Psychiatric: She has a normal mood and affect. Her behavior is normal. Judgment and thought content normal.   Vitals reviewed.   Adult ECG Report  Rate: 52;  Rhythm: sinus bradycardia; normal axis, intervals and durations.  Narrative Interpretation: Normal EKG  Recent Labs: Due for labs now. Lab Results  Component Value Date   CHOL 170 03/23/2019   HDL 88 03/23/2019   LDLCALC 70 03/23/2019   TRIG 63 03/23/2019   CHOLHDL 1.9 03/23/2019   Lab Results  Component Value Date   CREATININE 1.01 (H) 03/23/2019   BUN 15 03/23/2019   NA 138 03/23/2019   K 5.4 (H) 03/23/2019   CL 100 03/23/2019   CO2 25 03/23/2019    ASSESSMENT/PLAN    Problem List Items Addressed This Visit    ST elevation myocardial infarction (STEMI) of inferior wall, subsequent episode of care (HCC) (Chronic)    Hard to believe that we are 8 years out from her MI.  She is doing amazingly well.  She has gone through the anxiety phase.  Not actively having any chest pain or pressure or even dyspnea to suggest heart failure or angina. EF preserved after PCI to the RCA. Most recent Myoview showed a small fixed defect but with excellent exercise capacity of 10 METS.  No changes.      Relevant Medications   metoprolol tartrate (LOPRESSOR) 50 MG tablet   Other Relevant Orders   EKG 12-Lead   Lipid panel (Completed)   Comprehensive metabolic panel (Completed)   CAD S/P PCI mRCA: Promus Element DES 3.5 mm x 28 mm (4.2-37 mm)) - Primary (Chronic)    Now 8 years out from MI with PCI to the RCA.  Doing well with no angina.  Plan: Now on aspirin post Repatha along with moderate dose beta-blocker and ACE inhibitor. No change for now      Relevant Medications   metoprolol tartrate (LOPRESSOR) 50 MG tablet   Other Relevant Orders  EKG 12-Lead   Lipid panel (Completed)   Comprehensive metabolic panel (Completed)   Presence of stent in right coronary artery (Chronic)    No longer on Plavix.  On aspirin alone.  Okay to hold aspirin 5 to 7 days preop for surgeries or procedures.      Hyperlipidemia with target LDL less  than 70; Statin Intolerant (Chronic)    Tolerating Repatha well.  Is due for lab check.  Labs be ordered for this month and then if stable for 1 year down the road.  LDL was just at goal last year, but seems to be up up a little bit as of June.  Would like to see if there is continued bulging downward now that she is back on Repatha.  She is no longer on any statin.  Did not tolerate even low-dose.      Relevant Medications   metoprolol tartrate (LOPRESSOR) 50 MG tablet   Other Relevant Orders   Lipid panel (Completed)   Comprehensive metabolic panel (Completed)   Benign essential HTN (Chronic)    Stable, well controlled on current meds.  Is on moderate dose metoprolol tartrate along with benazepril.  No need for change.      Relevant Medications   metoprolol tartrate (LOPRESSOR) 50 MG tablet      COVID-19 Education: The signs and symptoms of COVID-19 were discussed with the patient and how to seek care for testing (follow up with PCP or arrange E-visit).   The importance of social distancing was discussed today.  I spent a total of 21 minutes with the patient and chart review. >  50% of the time was spent in direct patient consultation.  Additional time spent with chart review (studies, outside notes, etc): 5 Total Time: 26 min   Current medicines are reviewed at length with the patient today.  (+/- concerns) N/A   Patient Instructions / Medication Changes & Studies & Tests Ordered   Patient Instructions  Medication Instructions:  NO CHANGES *If you need a refill on your cardiac medications before your next appointment, please call your pharmacy*  Lab Work: LIPIDS CMP If you have labs (blood work) drawn today and your tests are completely normal, you will receive your results only by: Marland Kitchen MyChart Message (if you have MyChart) OR . A paper copy in the mail If you have any lab test that is abnormal or we need to change your treatment, we will call you to review the  results.  Testing/Procedures: Not needed  Follow-Up: At [facility], you and your health needs are our priority.  As part of our continuing mission to provide you with exceptional heart care, we have created designated Provider Care Teams.  These Care Teams include your primary Cardiologist (physician) and Advanced Practice Providers (APPs -  Physician Assistants and Nurse Practitioners) who all work together to provide you with the care you need, when you need it.  Your next appointment:   12 month(s)  The format for your next appointment:   In Person  Provider:   Glenetta Hew, MD  Other Instructions     Studies Ordered:   Orders Placed This Encounter  Procedures  . Lipid panel  . Comprehensive metabolic panel  . EKG 12-Lead     Glenetta Hew, M.D., M.S. Interventional Cardiologist   Pager # [phone] Phone # [phone] [address] [place], [place] [address]   Thank you for choosing Heartcare at [facility]!!

## 2019-03-24 ENCOUNTER — Encounter: Payer: Self-pay | Admitting: Cardiology

## 2019-03-24 LAB — COMPREHENSIVE METABOLIC PANEL
ALT: 16 IU/L (ref 0–32)
AST: 23 IU/L (ref 0–40)
Albumin/Globulin Ratio: 1.6 (ref 1.2–2.2)
Albumin: 4.2 g/dL (ref 3.8–4.8)
Alkaline Phosphatase: 110 IU/L (ref 39–117)
BUN/Creatinine Ratio: 15 (ref 12–28)
BUN: 15 mg/dL (ref 8–27)
Bilirubin Total: 0.3 mg/dL (ref 0.0–1.2)
CO2: 25 mmol/L (ref 20–29)
Calcium: 9.4 mg/dL (ref 8.7–10.3)
Chloride: 100 mmol/L (ref 96–106)
Creatinine, Ser: 1.01 mg/dL — ABNORMAL HIGH (ref 0.57–1.00)
GFR calc Af Amer: 68 mL/min/{1.73_m2} (ref >59)
GFR calc non Af Amer: 59 mL/min/{1.73_m2} — ABNORMAL LOW (ref >59)
Globulin, Total: 2.6 g/dL (ref 1.5–4.5)
Glucose: 79 mg/dL (ref 65–99)
Potassium: 5.4 mmol/L — ABNORMAL HIGH (ref 3.5–5.2)
Sodium: 138 mmol/L (ref 134–144)
Total Protein: 6.8 g/dL (ref 6.0–8.5)

## 2019-03-24 LAB — LIPID PANEL
Chol/HDL Ratio: 1.9 ratio (ref 0.0–4.4)
Cholesterol, Total: 170 mg/dL (ref 100–199)
HDL: 88 mg/dL (ref >39)
LDL Chol Calc (NIH): 70 mg/dL (ref 0–99)
Triglycerides: 63 mg/dL (ref 0–149)
VLDL Cholesterol Cal: 12 mg/dL (ref 5–40)

## 2019-03-24 NOTE — Assessment & Plan Note (Signed)
No longer on Plavix.  On aspirin alone.  Okay to hold aspirin 5 to 7 days preop for surgeries or procedures.

## 2019-03-24 NOTE — Assessment & Plan Note (Signed)
Hard to believe that we are 8 years out from her MI.  She is doing amazingly well.  She has gone through the anxiety phase.  Not actively having any chest pain or pressure or even dyspnea to suggest heart failure or angina. EF preserved after PCI to the RCA. Most recent Myoview showed a small fixed defect but with excellent exercise capacity of 10 METS.  No changes.

## 2019-03-24 NOTE — Assessment & Plan Note (Addendum)
Now 8 years out from MI with PCI to the RCA.  Doing well with no angina.  Plan: Now on aspirin post Repatha along with moderate dose beta-blocker and ACE inhibitor. No change for now

## 2019-03-24 NOTE — Assessment & Plan Note (Signed)
Stable, well controlled on current meds.  Is on moderate dose metoprolol tartrate along with benazepril.  No need for change.

## 2019-03-24 NOTE — Assessment & Plan Note (Signed)
Tolerating Repatha well.  Is due for lab check.  Labs be ordered for this month and then if stable for 1 year down the road.  LDL was just at goal last year, but seems to be up up a little bit as of June.  Would like to see if there is continued bulging downward now that she is back on Repatha.  She is no longer on any statin.  Did not tolerate even low-dose.

## 2019-06-25 ENCOUNTER — Ambulatory Visit: Payer: Self-pay | Admitting: Psychiatry

## 2019-11-01 ENCOUNTER — Other Ambulatory Visit: Payer: Self-pay | Admitting: Cardiology

## 2019-11-01 DIAGNOSIS — E785 Hyperlipidemia, unspecified: Secondary | ICD-10-CM

## 2019-11-01 DIAGNOSIS — I2510 Atherosclerotic heart disease of native coronary artery without angina pectoris: Secondary | ICD-10-CM

## 2019-11-02 ENCOUNTER — Other Ambulatory Visit: Payer: Self-pay

## 2019-11-02 ENCOUNTER — Ambulatory Visit (INDEPENDENT_AMBULATORY_CARE_PROVIDER_SITE_OTHER): Payer: 59

## 2019-11-02 DIAGNOSIS — L578 Other skin changes due to chronic exposure to nonionizing radiation: Secondary | ICD-10-CM

## 2019-11-02 DIAGNOSIS — I781 Nevus, non-neoplastic: Secondary | ICD-10-CM

## 2019-11-02 DIAGNOSIS — Z872 Personal history of diseases of the skin and subcutaneous tissue: Secondary | ICD-10-CM

## 2019-11-02 DIAGNOSIS — L821 Other seborrheic keratosis: Secondary | ICD-10-CM

## 2019-11-02 NOTE — Progress Notes (Signed)
BBL w/MLP. Recommended to start w/3 treatments spaced 4-6 weeks apart. Pt has a history of fever blisters. She will begin Valtrex today. Risks and benefits reviewed. jj

## 2019-12-07 ENCOUNTER — Other Ambulatory Visit: Payer: Self-pay

## 2019-12-07 ENCOUNTER — Ambulatory Visit: Payer: 59

## 2019-12-07 ENCOUNTER — Ambulatory Visit (INDEPENDENT_AMBULATORY_CARE_PROVIDER_SITE_OTHER): Payer: Self-pay

## 2019-12-07 DIAGNOSIS — I781 Nevus, non-neoplastic: Secondary | ICD-10-CM

## 2019-12-07 DIAGNOSIS — L988 Other specified disorders of the skin and subcutaneous tissue: Secondary | ICD-10-CM

## 2019-12-07 DIAGNOSIS — L578 Other skin changes due to chronic exposure to nonionizing radiation: Secondary | ICD-10-CM

## 2019-12-07 DIAGNOSIS — L821 Other seborrheic keratosis: Secondary | ICD-10-CM

## 2019-12-07 NOTE — Progress Notes (Signed)
Pt had great improvement after her 1st tx. jj

## 2020-01-18 ENCOUNTER — Ambulatory Visit: Payer: 59

## 2020-02-29 ENCOUNTER — Ambulatory Visit (INDEPENDENT_AMBULATORY_CARE_PROVIDER_SITE_OTHER): Payer: Self-pay

## 2020-02-29 ENCOUNTER — Other Ambulatory Visit: Payer: Self-pay

## 2020-02-29 DIAGNOSIS — L821 Other seborrheic keratosis: Secondary | ICD-10-CM

## 2020-02-29 DIAGNOSIS — I781 Nevus, non-neoplastic: Secondary | ICD-10-CM

## 2020-02-29 DIAGNOSIS — L578 Other skin changes due to chronic exposure to nonionizing radiation: Secondary | ICD-10-CM

## 2020-03-23 ENCOUNTER — Encounter: Payer: Self-pay | Admitting: Cardiology

## 2020-03-23 ENCOUNTER — Other Ambulatory Visit: Payer: Self-pay

## 2020-03-23 ENCOUNTER — Ambulatory Visit: Payer: 59 | Admitting: Cardiology

## 2020-03-23 VITALS — BP 128/72 | HR 53 | Ht 66.0 in | Wt 171.0 lb

## 2020-03-23 DIAGNOSIS — Z955 Presence of coronary angioplasty implant and graft: Secondary | ICD-10-CM

## 2020-03-23 DIAGNOSIS — I739 Peripheral vascular disease, unspecified: Secondary | ICD-10-CM | POA: Diagnosis not present

## 2020-03-23 DIAGNOSIS — R55 Syncope and collapse: Secondary | ICD-10-CM | POA: Diagnosis not present

## 2020-03-23 DIAGNOSIS — Z9861 Coronary angioplasty status: Secondary | ICD-10-CM

## 2020-03-23 DIAGNOSIS — I10 Essential (primary) hypertension: Secondary | ICD-10-CM

## 2020-03-23 DIAGNOSIS — M79662 Pain in left lower leg: Secondary | ICD-10-CM | POA: Diagnosis not present

## 2020-03-23 DIAGNOSIS — I2510 Atherosclerotic heart disease of native coronary artery without angina pectoris: Secondary | ICD-10-CM

## 2020-03-23 DIAGNOSIS — E785 Hyperlipidemia, unspecified: Secondary | ICD-10-CM

## 2020-03-23 DIAGNOSIS — R002 Palpitations: Secondary | ICD-10-CM

## 2020-03-23 DIAGNOSIS — I2119 ST elevation (STEMI) myocardial infarction involving other coronary artery of inferior wall: Secondary | ICD-10-CM

## 2020-03-23 DIAGNOSIS — M7989 Other specified soft tissue disorders: Secondary | ICD-10-CM

## 2020-03-23 NOTE — Assessment & Plan Note (Signed)
Noting intermittent fluttering sensations, and now with a syncopal episode.  Plan: 30-day monitor.

## 2020-03-23 NOTE — Assessment & Plan Note (Signed)
PCI to occluded RCA in setting of inferior STEMI.  Now 9 years out.  Remains on maintenance dose aspirin.   Okay to hold aspirin if necessary for procedures.  5 days preop.

## 2020-03-23 NOTE — Assessment & Plan Note (Signed)
Distant history of inferior STEMI.  RCA PCI.  Preserved EF.  Has not any further angina.  Myoview showed small fixed defect from previous infarct, but preserved EF.  Now with a syncopal episode, will recheck an echocardiogram just to ensure that there are no new structural normalities.  Nothing heard on exam, besides stable SEM.

## 2020-03-23 NOTE — Progress Notes (Signed)
Primary Care Provider: Kirk Ruths, MD Cardiologist: Glenetta Hew, MD Electrophysiologist: None  Clinic Note: Chief Complaint  Patient presents with  . Follow-up    Annual  . Coronary Artery Disease    No angina  . Foot Swelling    Left foot with blue-purple distal feet  . Loss of Consciousness    Was seated on her ottoman watching TV, and ended up lying on the floor, unsure of how she hit the floor.   Problem List Items Addressed This Visit    ST elevation myocardial infarction (STEMI) of inferior wall, subsequent episode of care ([place]) (Chronic)   CAD S/P PCI mRCA: Promus Element DES 3.5 mm x 28 mm (4.2-37 mm)) (Chronic)   Presence of stent in right coronary artery (Chronic)   Hyperlipidemia with target LDL less than 70; Statin Intolerant (Chronic)   Benign essential HTN (Chronic)   Pain and swelling of left lower leg   Palpitations   Syncope and collapse - Primary   Claudication in peripheral vascular disease (HCC)     HPI:    Jessica Landry is a 64 y.o. female with a PMH notable for h/o Inf STEMI (PCI-RCA 02/2011), Intermittent Syncope/Near Syncope who presents today for annual f/u. Marland Kitchen   Jessica Landry was last seen on March 23, 2019.  Doing well.  No major issues.  In great spirits.  Just not doing as well associated with routine exercise.  No angina or dyspnea.  Has lost some weight was up and was hoping to lose more.  Is replaced and was helping her walk more.  No changes.  Lipids ordered.  Recent Hospitalizations: None  Reviewed  CV studies:    The following studies were reviewed today: (if available, images/films reviewed: From Epic Chart or Care Everywhere) . None:   Interval History:   Jessica Landry returns today overall doing okay from a cardiac standpoint, however over the last couple months she has had some more concerning issues.  She has not had any chest pain or pressure with rest or exertion.  She has her intermittent pinpoint  pain along left sternal border, but that is oftentimes at rest, with deep inspiration etc.  Nothing like her anginal symptoms.  No PND, orthopnea.  As far she can tell no TIA or amaurosis fugax.  Major complaints  1. Reddish purplish discoloration of the tip of her left foot-toes with painful swelling  She has noticed over the last several months, 1+ edema.  Purplish blue discoloration.  No calf claudication, but does note some buttock and upper leg pain with walking.  The foot does not feel cold, no recent injuries. 2. Buttock/thigh claudication-limiting pain with walking.  Now having a hard time walking more than around the block without having significant pain in her lateral hips but also buttock and upper thigh.  Often has to stop and rest. 3. An episode of "passing out "while seated on her ottoman  Was sitting on the arm and watching television, and the next thing she knew she was on the ground.  Remembers what was happening up to the event, denies any rapid heart rates or palpitations.  No lightheadedness or prodrome/aura.  No loss of bowel or bladder  Cannot be certain, does not think that there is any change to her p.o. intake.  She does note that she has not been very active of late.  Does not really do much in the way of any particular exercise.  Despite  this, she is actually lost about 8 to 10 pounds in the last year with dietary adjustment.  Near the end of the visit she did mention that she is been having mild fluttering sensations off and on lasting less than 2 minutes over the last several months.  She had not had these in a while.  The patient does not have symptoms concerning for COVID-19 infection (fever, chills, cough, or new shortness of breath).   REVIEWED OF SYSTEMS   Review of Systems  Constitutional: Positive for weight loss (Roughly 8 pounds). Negative for malaise/fatigue (Mostly just deconditioning).  HENT: Negative for congestion and nosebleeds.   Respiratory:  Negative for sputum production.   Gastrointestinal: Negative for abdominal pain, blood in stool and melena.  Genitourinary: Negative for hematuria.  Musculoskeletal: Positive for back pain, falls (Only the episode noted above) and joint pain.  Neurological: Positive for dizziness (Rare; mostly notes that her balance is off.). Negative for sensory change, speech change, focal weakness, weakness and headaches.  Psychiatric/Behavioral: Negative for depression (Seems in good spirits) and memory loss. The patient is nervous/anxious (Pretty well controlled). The patient does not have insomnia.    I have reviewed and (if needed) personally updated the patient's problem list, medications, allergies, past medical and surgical history, social and family history.   PAST MEDICAL HISTORY   Past Medical History:  Diagnosis Date  . Anxiety   . Arthritis    knees & back   . CAD S/P percutaneous coronary angioplasty 03/07/2011   a) PCI of mRCA with Promus Element DES 3.5 mm x 28 mm (4.2 -- 3.7 mm); b) Myoview 04/2011: EF 81% (small LV Cavity), fixed basal-mid Inferior Infarct, No Ischemia, 10 METS  . Complication of anesthesia   . Depression 03/08/2011   Dr. Milus Banister- [place], [facility]  . Essential hypertension 03/07/2011  . Family history of adverse reaction to anesthesia   . GERD (gastroesophageal reflux disease)   . Headache    chocolate & red wine triggers /w bad headaches   . History of back surgery   . Hyperlipidemia with target LDL less than 70 2012    Statin Intolerance (tried Lipitor & Crestor)  . Insomnia    without significant sleeping disorders  . Iron deficiency anemia 03/09/2011  . Neuropathy   . PONV (postoperative nausea and vomiting)   . ST-segment elevation myocardial infarction (STEMI) of inferior wall (HCC) 03/07/2011   RCA Occlusion --> PCI of mRCA; ECHO 03/2011: EF > 55%, MIld Inferior HK, Mild AoV Sclerosis.  . Vocal cord polyp 2005    PAST SURGICAL HISTORY   Past Surgical  History:  Procedure Laterality Date  . ABDOMINAL HYSTERECTOMY    . APPENDECTOMY  07/04/2000   Normal appendix, adhesions noted.  Marland Kitchen BACK SURGERY     cyst on spinal cord - lumbar  . BREAST BIOPSY Right 01/31/1999   fibrocystic changes, ductal adenosis, focal atypical ductal epithelium.  Marland Kitchen BREAST EXCISIONAL BIOPSY    . CARDIAC CATHETERIZATION  03/08/2011   improved flow from the PCI  . CHOLECYSTECTOMY  03/25/1998   Dr. Bary Castilla, chronic cholecystitis.  . COLONOSCOPY WITH PROPOFOL N/A 09/28/2014   Procedure: COLONOSCOPY WITH PROPOFOL;  Surgeon: Robert Bellow, MD;  Location: [facility] ENDOSCOPY;  Service: Endoscopy;  Laterality: N/A;  . CORONARY ANGIOPLASTY WITH STENT PLACEMENT  03/07/2011   inferior STEMI - RCA PROMUS 3.5 mm x 28 mm DES  (post Dil 3.7 distal to 4.2 prox)  . DENTAL SURGERY  2016  . DOPPLER ECHOCARDIOGRAPHY  03/27/2011   LV cavity is small,EF =>55%,MILD INFERIOR HYPOKINESIS; Mild Aortic Sclerosis  . ESOPHAGOGASTRODUODENOSCOPY N/A 09/28/2014   Procedure: ESOPHAGOGASTRODUODENOSCOPY (EGD);  Surgeon: Robert Bellow, MD;  Location: [facility] ENDOSCOPY;  Service: Endoscopy;  Laterality: N/A;  . EYE SURGERY     blepheroplasty- bilateral  . JOINT REPLACEMENT    . KNEE SURGERY Left 2015  . KNEE SURGERY Right 2010   Baker cyst  . LEFT HEART CATHETERIZATION WITH CORONARY ANGIOGRAM N/A 03/07/2011   Procedure: LEFT HEART CATHETERIZATION WITH CORONARY ANGIOGRAM;  Surgeon: Leonie Man, MD;  Location: [facility] CATH LAB;  Service: Cardiovascular;  Laterality: N/A;  . LEFT HEART CATHETERIZATION WITH CORONARY ANGIOGRAM N/A 03/08/2011   Procedure: LEFT HEART CATHETERIZATION WITH CORONARY ANGIOGRAM;  Surgeon: Leonie Man, MD;  Location: [facility] CATH LAB;  Service: Cardiovascular;  Laterality: N/A;  . NM MYOCAR PERF WALL MOTION  08/2016   Lexiscan: LOW RISK.  No ischemia or infarction.  Hyperdynamic LV function .  EF> 65%  . skin nodule Left 09/11/1999   dermatofibroma left lower leg, positive  lateral margin.  Marland Kitchen TOTAL KNEE ARTHROPLASTY Bilateral 07/20/2015   Procedure: TOTAL KNEE BILATERAL;  Surgeon: Ninetta Lights, MD;  Location: [facility];  Service: Orthopedics;  Laterality: Bilateral;  . UPPER GI ENDOSCOPY  02-22-03   Dr. Bary Castilla, normal    Immunization History  Administered Date(s) Administered  . Influenza Split 03/08/2011  . Pneumococcal Polysaccharide-23 03/08/2011  . Tdap 03/11/2017    MEDICATIONS/ALLERGIES   Current Meds  Medication Sig  . aspirin EC 81 MG tablet Take 1 tablet (81 mg total) by mouth daily.  . benazepril (LOTENSIN) 5 MG tablet Take 1 tablet (5 mg total) by mouth daily before breakfast.  . buPROPion (WELLBUTRIN XL) 300 MG 24 hr tablet TAKE 1 TABLET EVERY MORNING  . diazepam (VALIUM) 5 MG tablet   . FLUoxetine (PROZAC) 10 MG capsule TAKE 1 CAPSULE EVERY DAY  WITH  40MG  . FLUoxetine (PROZAC) 40 MG capsule TAKE 1 CAPSULE EVERY DAY BEFORE BREAKFAST  WITH  10MG  CAP  . gabapentin (NEURONTIN) 300 MG capsule Take 2 capsules (600 mg total) by mouth at bedtime.  . metoprolol tartrate (LOPRESSOR) 50 MG tablet Take 1 tablet (50 mg total) by mouth 2 (two) times daily.  . nitroGLYCERIN (NITROSTAT) 0.4 MG SL tablet Place 1 tablet (0.4 mg total) under the tongue every 5 (five) minutes as needed for chest pain (up to 3 tabs in 15 mins and then call 911).  Marland Kitchen omeprazole (PRILOSEC) 20 MG capsule TAKE 1 CAPSULE(20 MG) BY MOUTH DAILY  . REPATHA SURECLICK 585 MG/ML SOAJ INJECT 140MG SUBCUTANEOUSLY EVERY TWO WEEKS    Allergies  Allergen Reactions  . Bee Venom Anaphylaxis  . Statins Other (See Comments)    Myalgias & fatigue  . Etodolac   . Prednisone Palpitations and Other (See Comments)    Can take small amounts. HR increase; increased energy   . Sulfa Antibiotics Other (See Comments)    Elevated B/P, skin turns red    SOCIAL HISTORY/FAMILY HISTORY   Reviewed in Epic:  Pertinent findings: n/a  OBJCTIVE -PE, EKG, labs   Wt Readings from Last 3 Encounters:   03/23/20 171 lb (77.6 kg)  03/23/19 168 lb (76.2 kg)  03/21/18 176 lb (79.8 kg)    Physical Exam: BP 128/72   Pulse (!) 53   Ht 5' 6" (1.676 m)   Wt 171 lb (77.6 kg)   BMI 27.60 kg/m  Physical Exam Vitals  reviewed.  Constitutional:      General: She is not in acute distress.    Appearance: She is normal weight. She is not ill-appearing or toxic-appearing.     Comments: Healthy-appearing.  Well-groomed.  HENT:     Head: Normocephalic and atraumatic.  Cardiovascular:     Rate and Rhythm: Normal rate and regular rhythm.     Pulses:          Carotid pulses are on the right side with bruit.      Dorsalis pedis pulses are 1+ on the right side and 1+ on the left side.       Posterior tibial pulses are 2+ on the right side and 2+ on the left side.     Heart sounds: Murmur (1/6C-D SEM at RUSB--neck) heard.  No friction rub. No gallop.   Pulmonary:     Effort: Pulmonary effort is normal. No respiratory distress.     Breath sounds: Normal breath sounds.  Chest:     Chest wall: Tenderness present.  Musculoskeletal:        General: Normal range of motion.     Left lower leg: Edema (1+ edema at the ankle) present.  Neurological:     General: No focal deficit present.     Mental Status: She is alert and oriented to person, place, and time. Mental status is at baseline.     Motor: No weakness.     Gait: Gait normal.  Psychiatric:        Mood and Affect: Mood normal.        Behavior: Behavior normal.        Thought Content: Thought content normal.        Judgment: Judgment normal.     Comments: A little anxious.     Adult ECG Report  Rate: 53 ;  Rhythm: sinus bradycardia; normal axis, intervals & durations  Narrative Interpretation: stable   Recent Labs:   Lipid Panel w/calc LDL Component 12/10/19 06/09/19  Cholesterol, Total 167 139  Triglyceride 72 57  HDL (High Density Lipoprotein) Cholesterol 79.7 72.1  LDL Calculated 73 56  VLDL Cholesterol 14 11  Cholesterol/HDL  Ratio 2.1 1.9   Comprehensive Metabolic Panel (CMP) Component 12/10/19 06/09/19  Glucose 87 93  Sodium 139 139  Potassium 4.5 4.6  Chloride 103 102  Carbon Dioxide (CO2) 31.7 30.8  Urea Nitrogen (BUN) 15 16  Creatinine 1.1 1.1  Glomerular Filtration Rate (eGFR), MDRD Estimate 50 50Low  Calcium 8.9 9.4  AST  15 18  ALT  12 15  Alk Phos (alkaline Phosphatase) 85 93  Albumin 3.8 4.0  Bilirubin, Total 0.3 0.4  Protein, Total 6.1 6.4  A/G Ratio 1.7 1.7    Ref Range & Units 06/09/19  WBC (White Blood Cell Count) 4.1 - 10.2 10^3/uL 4.9   RBC (Red Blood Cell Count) 4.04 - 5.48 10^6/uL 3.98Low   Hemoglobin 12.0 - 15.0 gm/dL 11.3Low   Hematocrit 35.0 - 47.0 % 36.3   MCV (Mean Corpuscular Volume) 80.0 - 100.0 fl 91.2   MCH (Mean Corpuscular Hemoglobin) 27.0 - 31.2 pg 28.4   Platelet Count 150 - 450 10^3/uL 312    Lab Results  Component Value Date   CHOL 170 03/23/2019   HDL 88 03/23/2019   LDLCALC 70 03/23/2019   TRIG 63 03/23/2019   CHOLHDL 1.9 03/23/2019    Lab Results  Component Value Date   TSH 3.040 05/08/2018    ASSESSMENT/PLAN    Problem List  Items Addressed This Visit    ST elevation myocardial infarction (STEMI) of inferior wall, subsequent episode of care [facility]) (Chronic)    Distant history of inferior STEMI.  RCA PCI.  Preserved EF.  Has not any further angina.  Myoview showed small fixed defect from previous infarct, but preserved EF.  Now with a syncopal episode, will recheck an echocardiogram just to ensure that there are no new structural normalities.  Nothing heard on exam, besides stable SEM.      Relevant Orders   EKG 12-Lead   VAS US CAROTID   VAS US AORTA/IVC/ILIACS   VAS [name] LOWER EXTREMITY ARTERIAL DUPLEX   VAS [name] LOWER EXTREMITY VENOUS (DVT)   ECHOCARDIOGRAM COMPLETE   Cardiac event monitor   VAS US CAROTID   CAD S/P PCI mRCA: Promus Element DES 3.5 mm x 28 mm (4.2-37 mm)) (Chronic)    She is now 9 years out from MI with PCI to the RCA.   No further angina.  Remains on maintenance dose of aspirin along with beta-blocker, ACE inhibitor and Repatha (in place of statin)  Doing well.  No further chest pain.  Okay to hold aspirin if necessary for procedures 5 days preop.      Relevant Orders   EKG 12-Lead   VAS US CAROTID   VAS US AORTA/IVC/ILIACS   VAS [name] LOWER EXTREMITY ARTERIAL DUPLEX   VAS [name] LOWER EXTREMITY VENOUS (DVT)   ECHOCARDIOGRAM COMPLETE   Cardiac event monitor   VAS US CAROTID   Presence of stent in right coronary artery (Chronic)    PCI to occluded RCA in setting of inferior STEMI.  Now 9 years out.  Remains on maintenance dose aspirin.   Okay to hold aspirin if necessary for procedures.  5 days preop.      Relevant Orders   EKG 12-Lead   VAS US CAROTID   VAS US AORTA/IVC/ILIACS   VAS [name] LOWER EXTREMITY ARTERIAL DUPLEX   VAS [name] LOWER EXTREMITY VENOUS (DVT)   ECHOCARDIOGRAM COMPLETE   Cardiac event monitor   VAS US CAROTID   Hyperlipidemia with target LDL less than 70; Statin Intolerant (Chronic)    She has been on Repatha for a while now.  Labs look good.  PCP has been checking them.  Interestingly, her LDL went up this time to above 70 for the first time in a while.  Discussed correlates with weight loss which is somewhat interesting.  We talked about dietary control.  Labs being followed by her PCP.  I will not change any medications for now.      Relevant Orders   EKG 12-Lead   VAS US CAROTID   VAS US AORTA/IVC/ILIACS   VAS [name] LOWER EXTREMITY ARTERIAL DUPLEX   VAS [name] LOWER EXTREMITY VENOUS (DVT)   ECHOCARDIOGRAM COMPLETE   Cardiac event monitor   VAS US CAROTID   Benign essential HTN (Chronic)    Blood pressure stable.  No change on current dose of metoprolol and benazepril.      Relevant Orders   EKG 12-Lead   VAS US CAROTID   VAS US AORTA/IVC/ILIACS   VAS [name] LOWER EXTREMITY ARTERIAL DUPLEX   VAS [name] LOWER EXTREMITY VENOUS (DVT)   ECHOCARDIOGRAM COMPLETE   Cardiac event monitor    VAS US CAROTID   Pain and swelling of left lower leg    Unusual finding for unilateral erythematous swelling.  Does not look like DVT.  Looks vascular in nature.  Plan: We will check  lower extremity arterial Dopplers as well as venous reflux Dopplers. -> With buttock and thigh claudication symptoms, will also check abdominal iliac arterial Dopplers.      Relevant Orders   EKG 12-Lead   VAS US CAROTID   VAS US AORTA/IVC/ILIACS   VAS [name] LOWER EXTREMITY ARTERIAL DUPLEX   VAS [name] LOWER EXTREMITY VENOUS (DVT)   ECHOCARDIOGRAM COMPLETE   Cardiac event monitor   VAS US CAROTID   Syncope and collapse    She has had  distant history of syncope, but no episodes in quite a while until this recent event.  Most likely consistent with dehydration and vasovagal episode, however there was no prodrome and no warning symptoms to speak of.    Need to exclude bradycardia arrhythmias or pauses, structural normalities of the heart as well as potential vertebral artery disease.  Plan: Check 30-day monitor, 2D echo and carotid Dopplers.      Relevant Orders   EKG 12-Lead   VAS US CAROTID   VAS US AORTA/IVC/ILIACS   VAS [name] LOWER EXTREMITY ARTERIAL DUPLEX   VAS [name] LOWER EXTREMITY VENOUS (DVT)   ECHOCARDIOGRAM COMPLETE   Cardiac event monitor   VAS US CAROTID   Claudication in peripheral vascular disease ([place]) - Primary   Relevant Orders   EKG 12-Lead   VAS US CAROTID   VAS US AORTA/IVC/ILIACS   VAS [name] LOWER EXTREMITY ARTERIAL DUPLEX   VAS [name] LOWER EXTREMITY VENOUS (DVT)   ECHOCARDIOGRAM COMPLETE   Cardiac event monitor   VAS US CAROTID   Palpitations    Noting intermittent fluttering sensations, and now with a syncopal episode.  Plan: 30-day monitor.      Relevant Orders   EKG 12-Lead   VAS US CAROTID   VAS US AORTA/IVC/ILIACS   VAS [name] LOWER EXTREMITY ARTERIAL DUPLEX   VAS [name] LOWER EXTREMITY VENOUS (DVT)   ECHOCARDIOGRAM COMPLETE   Cardiac event monitor   VAS US CAROTID       COVID-19 Education: The signs and symptoms of COVID-19 were discussed with the patient and how to seek care for testing (follow up with PCP or arrange E-visit).   The importance of social distancing and COVID-19 vaccination was discussed today. 2 min The patient is practicing social distancing & Masking.   I spent a total of 26 minutes with the patient spent in direct patient consultation.  Additional time spent with chart review  / charting (studies, outside notes, etc): 10 Total Time: 36 min   Current medicines are reviewed at length with the patient today.  (+/- concerns) n/a  This visit occurred during the SARS-CoV-2 public health emergency.  Safety protocols were in place, including screening questions prior to the visit, additional usage of staff PPE, and extensive cleaning of exam room while observing appropriate contact time as indicated for disinfecting solutions.  Notice: This dictation was prepared with Dragon dictation along with smaller phrase technology. Any transcriptional errors that result from this process are unintentional and may not be corrected upon review.  Patient Instructions / Medication Changes & Studies & Tests Ordered   Patient Instructions  Medication Instructions:  Continue same medications *If you need a refill on your cardiac medications before your next appointment, please call your pharmacy*   Lab Work: None ordered   Testing/Procedures: Echo 30 day Event Monitor Lower ext arterial dopplers Lower ext venous dopplers Abd aorta/illiac dopplers Carotid dopplers   Follow-Up: At [facility], you and your health needs are our priority.  As  part of our continuing mission to provide you with exceptional heart care, we have created designated Provider Care Teams.  These Care Teams include your primary Cardiologist (physician) and Advanced Practice Providers (APPs -  Physician Assistants and Nurse Practitioners) who all work together to provide  you with the care you need, when you need it.  We recommend signing up for the patient portal called "MyChart".  Sign up information is provided on this After Visit Summary.  MyChart is used to connect with patients for Virtual Visits (Telemedicine).  Patients are able to view lab/test results, encounter notes, upcoming appointments, etc.  Non-urgent messages can be sent to your provider as well.   To learn more about what you can do with MyChart, go to [email or web address].    Your next appointment:  After all test   The format for your next appointment:  Office   Provider:  Dr.Daryll Spisak  Studies Ordered:   Orders Placed This Encounter  Procedures  . Cardiac event monitor  . EKG 12-Lead  . ECHOCARDIOGRAM COMPLETE  . VAS US CAROTID  . VAS US AORTA/IVC/ILIACS  . VAS [name] LOWER EXTREMITY ARTERIAL DUPLEX  . VAS [name] LOWER EXTREMITY VENOUS (DVT)  . VAS US CAROTID     Glenetta Hew, M.D., M.S. Interventional Cardiologist   Pager # [phone] Phone # [phone] [address] [place], [place] [address]   Thank you for choosing Heartcare at [facility]!!

## 2020-03-23 NOTE — Assessment & Plan Note (Signed)
She is now 9 years out from MI with PCI to the RCA.  No further angina.  Remains on maintenance dose of aspirin along with beta-blocker, ACE inhibitor and Repatha (in place of statin)  Doing well.  No further chest pain.  Okay to hold aspirin if necessary for procedures 5 days preop.

## 2020-03-23 NOTE — Assessment & Plan Note (Signed)
She has had  distant history of syncope, but no episodes in quite a while until this recent event.  Most likely consistent with dehydration and vasovagal episode, however there was no prodrome and no warning symptoms to speak of.    Need to exclude bradycardia arrhythmias or pauses, structural normalities of the heart as well as potential vertebral artery disease.  Plan: Check 30-day monitor, 2D echo and carotid Dopplers.

## 2020-03-23 NOTE — Assessment & Plan Note (Signed)
Blood pressure stable.  No change on current dose of metoprolol and benazepril.

## 2020-03-23 NOTE — Assessment & Plan Note (Signed)
She has been on Repatha for a while now.  Labs look good.  PCP has been checking them.  Interestingly, her LDL went up this time to above 70 for the first time in a while.  Discussed correlates with weight loss which is somewhat interesting.  We talked about dietary control.  Labs being followed by her PCP.  I will not change any medications for now.

## 2020-03-23 NOTE — Patient Instructions (Signed)
Medication Instructions:  Continue same medications *If you need a refill on your cardiac medications before your next appointment, please call your pharmacy*   Lab Work: None ordered   Testing/Procedures: Echo 30 day Event Monitor Lower ext arterial dopplers Lower ext venous dopplers Abd aorta/illiac dopplers Carotid dopplers   Follow-Up: At [facility], you and your health needs are our priority.  As part of our continuing mission to provide you with exceptional heart care, we have created designated Provider Care Teams.  These Care Teams include your primary Cardiologist (physician) and Advanced Practice Providers (APPs -  Physician Assistants and Nurse Practitioners) who all work together to provide you with the care you need, when you need it.  We recommend signing up for the patient portal called "MyChart".  Sign up information is provided on this After Visit Summary.  MyChart is used to connect with patients for Virtual Visits (Telemedicine).  Patients are able to view lab/test results, encounter notes, upcoming appointments, etc.  Non-urgent messages can be sent to your provider as well.   To learn more about what you can do with MyChart, go to [email or web address].    Your next appointment:  After all test   The format for your next appointment:  Office   Provider:  Dr.Harding

## 2020-03-23 NOTE — Assessment & Plan Note (Signed)
Unusual finding for unilateral erythematous swelling.  Does not look like DVT.  Looks vascular in nature.  Plan: We will check lower extremity arterial Dopplers as well as venous reflux Dopplers. -> With buttock and thigh claudication symptoms, will also check abdominal iliac arterial Dopplers.

## 2020-03-25 ENCOUNTER — Telehealth: Payer: Self-pay | Admitting: Cardiology

## 2020-03-25 NOTE — Telephone Encounter (Signed)
Patient states during her last appointment Dr. Herbie Baltimore asked if she had pain in her left calf. She states she said no, but then she remembered she did have pain in her left calf on Sunday or Monday. She states it is also hurting now. She states she does not need a call back.

## 2020-03-25 NOTE — Telephone Encounter (Signed)
Left a message for the patient to call back even though she requested that she did not need one to get more information on her leg pain.   The patient is scheduled for a LEA and Aorta on 04/15/20 Venous scheduled for 04/11/20

## 2020-03-29 NOTE — Telephone Encounter (Signed)
Left message for patient to make sure she was aware of the appointments she has in January for the doppler studies. She is to call with questions.

## 2020-04-06 ENCOUNTER — Other Ambulatory Visit: Payer: Self-pay | Admitting: Cardiology

## 2020-04-06 DIAGNOSIS — I2510 Atherosclerotic heart disease of native coronary artery without angina pectoris: Secondary | ICD-10-CM

## 2020-04-06 DIAGNOSIS — Z955 Presence of coronary angioplasty implant and graft: Secondary | ICD-10-CM

## 2020-04-06 DIAGNOSIS — I2119 ST elevation (STEMI) myocardial infarction involving other coronary artery of inferior wall: Secondary | ICD-10-CM

## 2020-04-06 DIAGNOSIS — R55 Syncope and collapse: Secondary | ICD-10-CM

## 2020-04-06 DIAGNOSIS — E785 Hyperlipidemia, unspecified: Secondary | ICD-10-CM

## 2020-04-06 DIAGNOSIS — Z9861 Coronary angioplasty status: Secondary | ICD-10-CM

## 2020-04-06 DIAGNOSIS — M79662 Pain in left lower leg: Secondary | ICD-10-CM

## 2020-04-06 DIAGNOSIS — I739 Peripheral vascular disease, unspecified: Secondary | ICD-10-CM

## 2020-04-06 DIAGNOSIS — I10 Essential (primary) hypertension: Secondary | ICD-10-CM

## 2020-04-06 DIAGNOSIS — M7989 Other specified soft tissue disorders: Secondary | ICD-10-CM

## 2020-04-06 DIAGNOSIS — R002 Palpitations: Secondary | ICD-10-CM

## 2020-04-07 ENCOUNTER — Ambulatory Visit (INDEPENDENT_AMBULATORY_CARE_PROVIDER_SITE_OTHER): Payer: 59

## 2020-04-07 DIAGNOSIS — R55 Syncope and collapse: Secondary | ICD-10-CM | POA: Diagnosis not present

## 2020-04-07 DIAGNOSIS — I2510 Atherosclerotic heart disease of native coronary artery without angina pectoris: Secondary | ICD-10-CM

## 2020-04-07 DIAGNOSIS — M7989 Other specified soft tissue disorders: Secondary | ICD-10-CM

## 2020-04-07 DIAGNOSIS — E785 Hyperlipidemia, unspecified: Secondary | ICD-10-CM

## 2020-04-07 DIAGNOSIS — I739 Peripheral vascular disease, unspecified: Secondary | ICD-10-CM

## 2020-04-07 DIAGNOSIS — Z9861 Coronary angioplasty status: Secondary | ICD-10-CM

## 2020-04-07 DIAGNOSIS — I2119 ST elevation (STEMI) myocardial infarction involving other coronary artery of inferior wall: Secondary | ICD-10-CM | POA: Diagnosis not present

## 2020-04-07 DIAGNOSIS — Z955 Presence of coronary angioplasty implant and graft: Secondary | ICD-10-CM

## 2020-04-07 DIAGNOSIS — R002 Palpitations: Secondary | ICD-10-CM

## 2020-04-07 DIAGNOSIS — M79662 Pain in left lower leg: Secondary | ICD-10-CM

## 2020-04-07 DIAGNOSIS — I10 Essential (primary) hypertension: Secondary | ICD-10-CM

## 2020-04-11 ENCOUNTER — Telehealth: Payer: Self-pay | Admitting: *Deleted

## 2020-04-11 ENCOUNTER — Ambulatory Visit (HOSPITAL_BASED_OUTPATIENT_CLINIC_OR_DEPARTMENT_OTHER)
Admission: RE | Admit: 2020-04-11 | Discharge: 2020-04-11 | Disposition: A | Discharge status: Home / Self Care | Payer: 59 | Source: Ambulatory Visit | Attending: Cardiology | Admitting: Cardiology

## 2020-04-11 ENCOUNTER — Other Ambulatory Visit: Payer: Self-pay

## 2020-04-11 ENCOUNTER — Ambulatory Visit (HOSPITAL_COMMUNITY)
Admission: RE | Admit: 2020-04-11 | Discharge: 2020-04-11 | Disposition: A | Discharge status: Home / Self Care | Payer: 59 | Source: Ambulatory Visit | Attending: Cardiology | Admitting: Cardiology

## 2020-04-11 DIAGNOSIS — I739 Peripheral vascular disease, unspecified: Secondary | ICD-10-CM

## 2020-04-11 DIAGNOSIS — I10 Essential (primary) hypertension: Secondary | ICD-10-CM

## 2020-04-11 DIAGNOSIS — Z955 Presence of coronary angioplasty implant and graft: Secondary | ICD-10-CM

## 2020-04-11 DIAGNOSIS — Z9861 Coronary angioplasty status: Secondary | ICD-10-CM | POA: Insufficient documentation

## 2020-04-11 DIAGNOSIS — M7989 Other specified soft tissue disorders: Secondary | ICD-10-CM | POA: Insufficient documentation

## 2020-04-11 DIAGNOSIS — E785 Hyperlipidemia, unspecified: Secondary | ICD-10-CM | POA: Diagnosis present

## 2020-04-11 DIAGNOSIS — R002 Palpitations: Secondary | ICD-10-CM

## 2020-04-11 DIAGNOSIS — I2510 Atherosclerotic heart disease of native coronary artery without angina pectoris: Secondary | ICD-10-CM | POA: Diagnosis present

## 2020-04-11 DIAGNOSIS — I2119 ST elevation (STEMI) myocardial infarction involving other coronary artery of inferior wall: Secondary | ICD-10-CM | POA: Diagnosis present

## 2020-04-11 DIAGNOSIS — R55 Syncope and collapse: Secondary | ICD-10-CM

## 2020-04-11 DIAGNOSIS — M79662 Pain in left lower leg: Secondary | ICD-10-CM | POA: Diagnosis present

## 2020-04-11 NOTE — Telephone Encounter (Signed)
Received [employer] monitor strip, critical event sinus bradycardia with HR of 53  HR at last office visit 53 Strip given to Antionette Fairy RN with Dr Herbie Baltimore for him to review Will call patient if any further recommendations

## 2020-04-11 NOTE — Telephone Encounter (Signed)
Dr Herbie Baltimore reviewed strips, no changes continue to monitor

## 2020-04-14 ENCOUNTER — Encounter (HOSPITAL_COMMUNITY): Payer: 59

## 2020-04-15 ENCOUNTER — Other Ambulatory Visit: Payer: Self-pay

## 2020-04-15 ENCOUNTER — Ambulatory Visit (HOSPITAL_BASED_OUTPATIENT_CLINIC_OR_DEPARTMENT_OTHER)
Admission: RE | Admit: 2020-04-15 | Discharge: 2020-04-15 | Disposition: A | Discharge status: Home / Self Care | Payer: 59 | Source: Ambulatory Visit | Attending: Cardiology | Admitting: Cardiology

## 2020-04-15 ENCOUNTER — Ambulatory Visit (HOSPITAL_COMMUNITY)
Admission: RE | Admit: 2020-04-15 | Discharge: 2020-04-15 | Disposition: A | Discharge status: Home / Self Care | Payer: 59 | Source: Ambulatory Visit | Attending: Cardiology | Admitting: Cardiology

## 2020-04-15 DIAGNOSIS — Z955 Presence of coronary angioplasty implant and graft: Secondary | ICD-10-CM | POA: Insufficient documentation

## 2020-04-15 DIAGNOSIS — R55 Syncope and collapse: Secondary | ICD-10-CM | POA: Insufficient documentation

## 2020-04-15 DIAGNOSIS — M79662 Pain in left lower leg: Secondary | ICD-10-CM

## 2020-04-15 DIAGNOSIS — Z9861 Coronary angioplasty status: Secondary | ICD-10-CM | POA: Insufficient documentation

## 2020-04-15 DIAGNOSIS — I739 Peripheral vascular disease, unspecified: Secondary | ICD-10-CM | POA: Insufficient documentation

## 2020-04-15 DIAGNOSIS — I2119 ST elevation (STEMI) myocardial infarction involving other coronary artery of inferior wall: Secondary | ICD-10-CM

## 2020-04-15 DIAGNOSIS — I2510 Atherosclerotic heart disease of native coronary artery without angina pectoris: Secondary | ICD-10-CM

## 2020-04-15 DIAGNOSIS — E785 Hyperlipidemia, unspecified: Secondary | ICD-10-CM

## 2020-04-15 DIAGNOSIS — I10 Essential (primary) hypertension: Secondary | ICD-10-CM | POA: Insufficient documentation

## 2020-04-15 DIAGNOSIS — M7989 Other specified soft tissue disorders: Secondary | ICD-10-CM

## 2020-04-15 DIAGNOSIS — R002 Palpitations: Secondary | ICD-10-CM

## 2020-04-22 ENCOUNTER — Ambulatory Visit (HOSPITAL_COMMUNITY): Payer: 59 | Attending: Cardiovascular Disease

## 2020-04-22 ENCOUNTER — Other Ambulatory Visit: Payer: Self-pay

## 2020-04-22 DIAGNOSIS — Z9861 Coronary angioplasty status: Secondary | ICD-10-CM | POA: Insufficient documentation

## 2020-04-22 DIAGNOSIS — R55 Syncope and collapse: Secondary | ICD-10-CM

## 2020-04-22 DIAGNOSIS — I739 Peripheral vascular disease, unspecified: Secondary | ICD-10-CM | POA: Diagnosis present

## 2020-04-22 DIAGNOSIS — E785 Hyperlipidemia, unspecified: Secondary | ICD-10-CM

## 2020-04-22 DIAGNOSIS — I2510 Atherosclerotic heart disease of native coronary artery without angina pectoris: Secondary | ICD-10-CM

## 2020-04-22 DIAGNOSIS — M79662 Pain in left lower leg: Secondary | ICD-10-CM

## 2020-04-22 DIAGNOSIS — Z955 Presence of coronary angioplasty implant and graft: Secondary | ICD-10-CM | POA: Diagnosis present

## 2020-04-22 DIAGNOSIS — I2119 ST elevation (STEMI) myocardial infarction involving other coronary artery of inferior wall: Secondary | ICD-10-CM

## 2020-04-22 DIAGNOSIS — I10 Essential (primary) hypertension: Secondary | ICD-10-CM

## 2020-04-22 DIAGNOSIS — R002 Palpitations: Secondary | ICD-10-CM

## 2020-04-22 DIAGNOSIS — M7989 Other specified soft tissue disorders: Secondary | ICD-10-CM

## 2020-04-22 HISTORY — PX: TRANSTHORACIC ECHOCARDIOGRAM: SHX275

## 2020-04-22 LAB — ECHOCARDIOGRAM COMPLETE
Area-P 1/2: 4.31 cm2
P 1/2 time: 543 msec
S' Lateral: 3 cm

## 2020-04-27 ENCOUNTER — Telehealth: Payer: Self-pay

## 2020-04-27 NOTE — Telephone Encounter (Signed)
Received Critical Monitor report for patient from 1/15 @ 1:43pm. There were 3 attempts by monitor company and no answer, at that time.   Called patient on cell. No answer and VM full  Called patient at home, no answer and left message to call back.   Strips given to MD to review when he returns to office, from similar strips on 1/3

## 2020-04-28 ENCOUNTER — Telehealth: Payer: Self-pay | Admitting: Cardiology

## 2020-04-28 NOTE — Telephone Encounter (Signed)
Patient returning a call to discuss Monitor results. Please call back

## 2020-04-28 NOTE — Telephone Encounter (Signed)
Spoke to patient.  Informed patient that the office was calling yesterday to see how she was doing- office receive monitor report from 04/23/20. Patient states she will wear monitor until 05/06/20.  Patient states she is doing okay and has been documenting any episodes.

## 2020-05-13 ENCOUNTER — Other Ambulatory Visit: Payer: Self-pay | Admitting: Cardiology

## 2020-05-16 ENCOUNTER — Telehealth: Payer: Self-pay | Admitting: Cardiology

## 2020-05-16 NOTE — Telephone Encounter (Signed)
Metoprolol called into [facility] pharmacy. Spoke with pharmacist [name]. Patient updated.

## 2020-05-16 NOTE — Telephone Encounter (Signed)
Pt c/o medication issue:  1. Name of Medication: metoprolol tartrate (LOPRESSOR) 50 MG tablet  2. How are you currently taking this medication (dosage and times per day)? As written  3. Are you having a reaction (difficulty breathing--STAT)? No   4. What is your medication issue? Patient says that Francine Graven has been trying to contact Dr. Herbie Baltimore or nurse regarding her medication.

## 2020-06-02 ENCOUNTER — Ambulatory Visit (INDEPENDENT_AMBULATORY_CARE_PROVIDER_SITE_OTHER): Payer: Medicare HMO | Admitting: Cardiology

## 2020-06-02 ENCOUNTER — Encounter: Payer: Self-pay | Admitting: Cardiology

## 2020-06-02 ENCOUNTER — Other Ambulatory Visit: Payer: Self-pay

## 2020-06-02 VITALS — BP 148/80 | HR 61 | Ht 67.0 in | Wt 174.0 lb

## 2020-06-02 DIAGNOSIS — I2119 ST elevation (STEMI) myocardial infarction involving other coronary artery of inferior wall: Secondary | ICD-10-CM

## 2020-06-02 DIAGNOSIS — I10 Essential (primary) hypertension: Secondary | ICD-10-CM

## 2020-06-02 DIAGNOSIS — I2510 Atherosclerotic heart disease of native coronary artery without angina pectoris: Secondary | ICD-10-CM | POA: Diagnosis not present

## 2020-06-02 DIAGNOSIS — Z9861 Coronary angioplasty status: Secondary | ICD-10-CM

## 2020-06-02 DIAGNOSIS — R55 Syncope and collapse: Secondary | ICD-10-CM | POA: Diagnosis not present

## 2020-06-02 DIAGNOSIS — E663 Overweight: Secondary | ICD-10-CM

## 2020-06-02 DIAGNOSIS — E785 Hyperlipidemia, unspecified: Secondary | ICD-10-CM

## 2020-06-02 DIAGNOSIS — I739 Peripheral vascular disease, unspecified: Secondary | ICD-10-CM | POA: Diagnosis not present

## 2020-06-02 NOTE — Patient Instructions (Addendum)
Medication Instructions:  NO CHANGES  *If you need a refill on your cardiac medications before your next appointment, please call your pharmacy*   Lab Work: NOT NEEDED    Testing/Procedures:  NOT NEEDED  Follow-Up: At [facility], you and your health needs are our priority.  As part of our continuing mission to provide you with exceptional heart care, we have created designated Provider Care Teams.  These Care Teams include your primary Cardiologist (physician) and Advanced Practice Providers (APPs -  Physician Assistants and Nurse Practitioners) who all work together to provide you with the care you need, when you need it.     Your next appointment:   9 month(s) NOV 2022  The format for your next appointment:   In Person  Provider:   Bryan Lemma, MD   Other Instructions   [facility]

## 2020-06-02 NOTE — Progress Notes (Signed)
Primary Care Provider: Kirk Ruths, MD Cardiologist: Glenetta Hew, MD Electrophysiologist: None  Clinic Note: Chief Complaint  Patient presents with   Follow-up    88-monthtest results   Palpitations    Better   Left foot pain    Still has the red blotchy discoloration    ===================================  ASSESSMENT/PLAN   Problem List Items Addressed This Visit    Syncope and collapse    Normal carotid Dopplers, echo and monitor.  She denies any further episodes.  At this point we will simply monitor.  If she has another episode, we could consider a loop recorder.      Claudication in peripheral vascular disease (HCC)    Clearly, the ABIs as well as aortoiliac Dopplers were suggest that her symptoms are probably not claudication related.  Only abnormal finding was mildly decreased left TBI.  Not enough to explain the redness that she sees on her foot.  Is probably more related to inflammation.      ST elevation myocardial infarction (STEMI) of inferior wall, subsequent episode of care ([facility] (Chronic)    9 years and 3 months from her STEMISmall fixed perfusion defect, but normal wall motion and preserved EF of 60 to 65% by echo.       CAD S/P PCI mRCA: Promus Element DES 3.5 mm x 28 mm (4.2-37 mm)) - Primary (Chronic)    She is somewhat excited about the fact that she is going into almost 10 years from her MI.  No further anginal symptoms.  I encouraged that she stay active.  Plan:   She is now only on aspirin, not on Plavix. ->  If necessary, okay to hold aspirin PRN procedures.  Continue beta-blocker and ACE inhibitor along with aspirin.  Seizure tolerating Repatha well.      Hyperlipidemia with target LDL less than 70; Statin Intolerant (Chronic)    She is due for labs to be rechecked in March.  As of September, LDL was almost to goal-73.  Would like to see it drop below 70.  Discussed dietary modification.  Hopefully with continued weight loss,  this will improve.      Overweight (BMI 25.0-29.9) (Chronic)    She has slowly started losing weight but very times she loses it comes back.  We talked about staying on the exercise wagon and maintaining her diet.      Essential hypertension (Chronic)    Her blood pressure is high today which is unusual for her.  Continue to monitor.  We may need to titrate up ACE inhibitor.  She is on metoprolol 50 twice daily and benazepril 5 mg.  She had some daytime fatigue and I suggested she give a trial of reducing morning dose of metoprolol 25 mg.  If this does leave the blood pressure increasing, then will increase benazepril to 10.         ===================================  HPI:    SSONI KEGELis a 65y.o. female with a PMH notable for CAD (inferior STEMI-RCA PCI in November 2012), Intermittent Syncope/near Syncope who presents today for 249-monthollow-up to discuss results of studies.. Jessica Hamburgeras last seen on March 23, 2020 as an annual follow-up, but she had multiple complaints including:   Reddish-purple discoloration the tip of her left toe with painful swelling.  Several months.  1+ edema.    No claudication in the calf but some buttock claudication noted.  1 episode of syncope while seated on her  ottoman.  Occasional fluttering episodes.  Lasting maybe 2 minutes.  Noted back pain, joint pain and dizziness/balance off.  Anxiety under control. => Had not been very active with exercise, but had lost weight with dietary changes.  Recent Hospitalizations: None  Reviewed  CV studies:    The following studies were reviewed today: (if available, images/films reviewed: From Epic Chart or Care Everywhere)  Lower Venous DVT Study 04/11/2020 VAS [name] LOWER EXTREMITY VENOUS (DVT): No evidence of DVT in lower extremity.  No direct evidence of proximal obstructive lesion.  No cystic structure found.  Minimal superficial edema seen at medial ankle.  Carotid Duplex /VAS US  CAROTID 04/11/2020: 1-39% bilateral ICA.  Bilateral vertebral arteries normal.  Normal subclavian arteries.     VAS US AORTA/IVC/ILIACS 04/15/2020  no evidence abdominal aortic aneurysm.  Largest diameter 2.3 cm.  Mild aortoiliac atherosclerosis without evidence of significant stenosis.  No evidence of thrombus in the IVC.  VAS [name] LOWER EXTREMITY ARTERIAL DUPLEX   R ABI 1.18, R TBI 0.87; L ABI 1.20, L TBI 0.67 (borderline abnormal.)      ECHOCARDIOGRAM COMPLETE  04/22/2020  EF 60-65%.  GRII DD.  No RWM A.  Normal RV.  Mild LA dilation.  (This is not consistent with GRII D), mild ascending aorta dilation of 38 mm.  Otherwise normal.  Cardiac event monitor  January 2022  Predominant Rhythm SR: Minimum 47 bpm, maximum 93 bpm.  Average 59 bpm; no A. fib or other arrhythmias.  Rare PVCs and PACs; occasionally secondary AV block type II noted but simply sinus bradycardia.   48 triggers noted 16 patient triggered and 32 auto trigger.  Patient triggers were occasionally associated with intermittent interpolated PVCs.  Mostly without any findings.  "Critical findingswere actually incorrect and not Mobitz type II block    Interval History:   Jessica Landry returns here today overall feeling better from a cardiac standpoint.  She was happy to see some of the results of her tests, but had not heard heard about her palpitation evaluation with the monitor.  She is very anxious and nervous.  Concerned about her findings. She still has a discoloration on her left foot.  It is somewhat uncomfortable, but she really does not have any significant claudication symptoms.  She also notes that she feels a little bit tired and fatigued during the day.  She probably more because of lack of motivation has not been doing much exercise.  CV Review of Symptoms (Summary): no chest pain or dyspnea on exertion positive for - Still has the redness and swelling on the left foot.  Ankle is better.  Palpitations have improved. negative  for - irregular heartbeat, orthopnea, paroxysmal nocturnal dyspnea, rapid heart rate, shortness of breath or Lightheadedness, dizziness, syncope/near syncope or TIA/amaurosis fugax, claudication  The patient does not have symptoms concerning for COVID-19 infection (fever, chills, cough, or new shortness of breath).   REVIEWED OF SYSTEMS   Review of Systems  Constitutional: Negative for malaise/fatigue and weight loss (She asked actually put some weight back on.).  HENT: Negative for congestion and nosebleeds.   Respiratory: Negative for cough and shortness of breath.   Cardiovascular:       Per HPI  Gastrointestinal: Negative for blood in stool and melena.  Genitourinary: Negative for hematuria.  Musculoskeletal: Positive for joint pain (Left ankle and toe).  Neurological: Negative for dizziness and headaches.  Psychiatric/Behavioral: Negative for depression and memory loss. The patient is nervous/anxious (Very anxious to  hear the results of her studies.). The patient does not have insomnia.    I have reviewed and (if needed) personally updated the patient's problem list, medications, allergies, past medical and surgical history, social and family history.   PAST MEDICAL HISTORY   Past Medical History:  Diagnosis Date   Anxiety    Arthritis    knees & back    CAD S/P percutaneous coronary angioplasty 03/07/2011   a) PCI of mRCA with Promus Element DES 3.5 mm x 28 mm (4.2 -- 3.7 mm); b) Myoview 04/2011: EF 81% (small LV Cavity), fixed basal-mid Inferior Infarct, No Ischemia, 10 METS   Complication of anesthesia    Depression 03/08/2011   Dr. Milus Banister- [place], [facility]   Essential hypertension 03/07/2011   Family history of adverse reaction to anesthesia    GERD (gastroesophageal reflux disease)    Headache    chocolate & red wine triggers /w bad headaches    History of back surgery    Hyperlipidemia with target LDL less than 70 2012    Statin Intolerance (tried Lipitor &  Crestor)   Insomnia    without significant sleeping disorders   Iron deficiency anemia 03/09/2011   Neuropathy    PONV (postoperative nausea and vomiting)    ST-segment elevation myocardial infarction (STEMI) of inferior wall ([place]) 03/07/2011   RCA Occlusion --> PCI of mRCA; ECHO 03/2011: EF > 55%, MIld Inferior HK, Mild AoV Sclerosis.   Vocal cord polyp 2005    PAST SURGICAL HISTORY   Past Surgical History:  Procedure Laterality Date   ABDOMINAL HYSTERECTOMY     APPENDECTOMY  07/04/2000   Normal appendix, adhesions noted.   BACK SURGERY     cyst on spinal cord - lumbar   BREAST BIOPSY Right 01/31/1999   fibrocystic changes, ductal adenosis, focal atypical ductal epithelium.   BREAST EXCISIONAL BIOPSY     CARDIAC CATHETERIZATION  03/08/2011   improved flow from the PCI   CHOLECYSTECTOMY  03/25/1998   Dr. Bary Castilla, chronic cholecystitis.   COLONOSCOPY WITH PROPOFOL N/A 09/28/2014   Procedure: COLONOSCOPY WITH PROPOFOL;  Surgeon: Robert Bellow, MD;  Location: [facility] ENDOSCOPY;  Service: Endoscopy;  Laterality: N/A;   CORONARY ANGIOPLASTY WITH STENT PLACEMENT  03/07/2011   inferior STEMI - RCA PROMUS 3.5 mm x 28 mm DES  (post Dil 3.7 distal to 4.2 prox)   DENTAL SURGERY  2016   DOPPLER ECHOCARDIOGRAPHY  03/27/2011   LV cavity is small,EF =>55%,MILD INFERIOR HYPOKINESIS; Mild Aortic Sclerosis   ESOPHAGOGASTRODUODENOSCOPY N/A 09/28/2014   Procedure: ESOPHAGOGASTRODUODENOSCOPY (EGD);  Surgeon: Robert Bellow, MD;  Location: [facility] ENDOSCOPY;  Service: Endoscopy;  Laterality: N/A;   EYE SURGERY     blepheroplasty- bilateral   JOINT REPLACEMENT     KNEE SURGERY Left 2015   KNEE SURGERY Right 2010   Baker cyst   LEFT HEART CATHETERIZATION WITH CORONARY ANGIOGRAM N/A 03/07/2011   Procedure: LEFT HEART CATHETERIZATION WITH CORONARY ANGIOGRAM;  Surgeon: Leonie Man, MD;  Location: [facility] CATH LAB;  Service: Cardiovascular;  Laterality: N/A;   LEFT HEART  CATHETERIZATION WITH CORONARY ANGIOGRAM N/A 03/08/2011   Procedure: LEFT HEART CATHETERIZATION WITH CORONARY ANGIOGRAM;  Surgeon: Leonie Man, MD;  Location: [facility] CATH LAB;  Service: Cardiovascular;  Laterality: N/A;   NM MYOCAR PERF WALL MOTION  08/2016   Lexiscan: LOW RISK.  No ischemia or infarction.  Hyperdynamic LV function .  EF> 65%   skin nodule Left 09/11/1999   dermatofibroma left lower leg, positive  lateral margin.   TOTAL KNEE ARTHROPLASTY Bilateral 07/20/2015   Procedure: TOTAL KNEE BILATERAL;  Surgeon: Ninetta Lights, MD;  Location: [place];  Service: Orthopedics;  Laterality: Bilateral;   TRANSTHORACIC ECHOCARDIOGRAM  04/22/2020    EF 60-65%.  GRII DD.  No RWM A.  Normal RV.  Mild LA dilation.  (This is not consistent with GRII D), mild ascending aorta dilation of 38 mm.  Otherwise normal.   UPPER GI ENDOSCOPY  02-22-03   Dr. Bary Castilla, normal    Immunization History  Administered Date(s) Administered   Influenza Split 03/08/2011   Pneumococcal Polysaccharide-23 03/08/2011   Tdap 03/11/2017    MEDICATIONS/ALLERGIES   Current Meds  Medication Sig   aspirin EC 81 MG tablet Take 1 tablet (81 mg total) by mouth daily.   benazepril (LOTENSIN) 5 MG tablet Take 1 tablet (5 mg total) by mouth daily before breakfast.   buPROPion (WELLBUTRIN XL) 300 MG 24 hr tablet TAKE 1 TABLET EVERY MORNING   diazepam (VALIUM) 5 MG tablet    FLUoxetine (PROZAC) 10 MG capsule TAKE 1 CAPSULE EVERY DAY  WITH  40MG   FLUoxetine (PROZAC) 40 MG capsule TAKE 1 CAPSULE EVERY DAY BEFORE BREAKFAST  WITH  10MG  CAP   gabapentin (NEURONTIN) 300 MG capsule Take 2 capsules (600 mg total) by mouth at bedtime.   metoprolol tartrate (LOPRESSOR) 50 MG tablet TAKE 1 TABLET TWICE DAILY   nitroGLYCERIN (NITROSTAT) 0.4 MG SL tablet Place 1 tablet (0.4 mg total) under the tongue every 5 (five) minutes as needed for chest pain (up to 3 tabs in 15 mins and then call 911).   omeprazole (PRILOSEC) 20 MG  capsule TAKE 1 CAPSULE(20 MG) BY MOUTH DAILY   [DISCONTINUED] REPATHA SURECLICK 235 MG/ML SOAJ INJECT 140MG SUBCUTANEOUSLY EVERY TWO WEEKS    Allergies  Allergen Reactions   Bee Venom Anaphylaxis   Statins Other (See Comments)    Myalgias & fatigue   Etodolac    Prednisone Palpitations and Other (See Comments)    Can take small amounts. HR increase; increased energy    Sulfa Antibiotics Other (See Comments)    Elevated B/P, skin turns red    SOCIAL HISTORY/FAMILY HISTORY   Reviewed in Epic:  Pertinent findings:  Social History   Tobacco Use   Smoking status: Former Smoker    Packs/day: 0.25    Years: 40.00    Pack years: 10.00    Types: Cigarettes    Quit date: 03/07/2011    Years since quitting: 9.3   Smokeless tobacco: Never Used  Vaping Use   Vaping Use: Never used  Substance Use Topics   Alcohol use: No    Alcohol/week: 0.0 standard drinks   Drug use: No   Social History   Social History Narrative   Divorced woman -- Now Remarried to her long-term partner.   Exercises routinely on a daily basis roughly 45 minutes a day walking.  Quit smoking at the time of her MI. Does not drink.    OBJCTIVE -PE, EKG, labs   Wt Readings from Last 3 Encounters:  06/02/20 174 lb (78.9 kg)  03/23/20 171 lb (77.6 kg)  03/23/19 168 lb (76.2 kg)    Physical Exam: BP (!) 148/80    Pulse 61    Ht '5\' 7"'  (1.702 m)    Wt 174 lb (78.9 kg)    SpO2 99%    BMI 27.25 kg/m  Physical Exam Vitals reviewed.  Constitutional:  Appearance: Normal appearance. She is normal weight. She is not ill-appearing or toxic-appearing.     Comments: Well-groomed.  Healthy-appearing.  HENT:     Head: Normocephalic and atraumatic.  Neck:     Vascular: Carotid bruit (Cannot exclude right-sided bruit.) present.  Cardiovascular:     Rate and Rhythm: Normal rate and regular rhythm.  No extrasystoles are present.    Chest Wall: PMI is not displaced.     Pulses: Decreased pulses (Mildly  decreased pedal pulses).     Heart sounds: S1 normal and S2 normal. Murmur (1/6 COPD SEM at RUSB.) heard.  No friction rub. No gallop.   Pulmonary:     Effort: Pulmonary effort is normal. No respiratory distress.     Breath sounds: Normal breath sounds.  Chest:     Chest wall: No tenderness.  Musculoskeletal:     Cervical back: Normal range of motion and neck supple.     Left lower leg: Edema (Left ankle swelling is improved but still there.) present.  Neurological:     General: No focal deficit present.     Mental Status: She is alert and oriented to person, place, and time.     Gait: Gait abnormal (She does favor the left foot a little bit).  Psychiatric:        Behavior: Behavior normal.        Thought Content: Thought content normal.        Judgment: Judgment normal.     Comments: Very anxious, nervous to hear the results of her studies.  Her blood pressure is high today higher than usual.     Adult ECG Report n/a  Recent Labs: Due to be checked in March Lipid Panel w/calc LDL Component 12/10/19  Cholesterol, Total 167  Triglyceride 72  HDL (High Density Lipoprotein) Cholesterol 79.7  LDL Calculated 73  VLDL Cholesterol 14  Cholesterol/HDL Ratio 2.1   Comprehensive Metabolic Panel (CMP) Component 12/10/19  Glucose 87  Sodium 139  Potassium 4.5  Chloride 103  Carbon Dioxide (CO2) 31.7  Urea Nitrogen (BUN) 15  Creatinine 1.1  Glomerular Filtration Rate (eGFR), MDRD Estimate 50  Calcium 8.9  AST  15  ALT  12  Alk Phos (alkaline Phosphatase) 85  Albumin 3.8  Bilirubin, Total 0.3  Protein, Total 6.1  A/G Ratio 1.7    Lab Results  Component Value Date   TSH 3.040 05/08/2018    ==================================================  COVID-19 Education: The signs and symptoms of COVID-19 were discussed with the patient and how to seek care for testing (follow up with PCP or arrange E-visit).   The importance of social distancing and COVID-19 vaccination was  discussed today. The patient is practicing social distancing & Masking.   I spent a total of 56mnutes with the patient spent in direct patient consultation.  Additional time spent with chart review  / charting (studies, outside notes, etc): 15 min Total Time: 51 min  Current medicines are reviewed at length with the patient today.  (+/- concerns) none  This visit occurred during the SARS-CoV-2 public health emergency.  Safety protocols were in place, including screening questions prior to the visit, additional usage of staff PPE, and extensive cleaning of exam room while observing appropriate contact time as indicated for disinfecting solutions.  Notice: This dictation was prepared with Dragon dictation along with smaller phrase technology. Any transcriptional errors that result from this process are unintentional and may not be corrected upon review.  Patient Instructions / Medication Changes &  Studies & Tests Ordered   Patient Instructions  Medication Instructions:  NO CHANGES  *If you need a refill on your cardiac medications before your next appointment, please call your pharmacy*   Lab Work: NOT NEEDED    Testing/Procedures:  NOT NEEDED  Follow-Up: At [facility], you and your health needs are our priority.  As part of our continuing mission to provide you with exceptional heart care, we have created designated Provider Care Teams.  These Care Teams include your primary Cardiologist (physician) and Advanced Practice Providers (APPs -  Physician Assistants and Nurse Practitioners) who all work together to provide you with the care you need, when you need it.     Your next appointment:   9 month(s) NOV 2022  The format for your next appointment:   In Person  Provider:   Glenetta Hew, MD   Other Instructions   KEEP WALKING    Studies Ordered:   No orders of the defined types were placed in this encounter.    Glenetta Hew, M.D., M.S. Interventional  Cardiologist   Pager # [phone] Phone # [phone] [address] [place], [place] [address]   Thank you for choosing Heartcare at [facility]!!

## 2020-06-13 DIAGNOSIS — I25119 Atherosclerotic heart disease of native coronary artery with unspecified angina pectoris: Secondary | ICD-10-CM | POA: Diagnosis not present

## 2020-06-13 DIAGNOSIS — I129 Hypertensive chronic kidney disease with stage 1 through stage 4 chronic kidney disease, or unspecified chronic kidney disease: Secondary | ICD-10-CM | POA: Diagnosis not present

## 2020-06-13 DIAGNOSIS — E785 Hyperlipidemia, unspecified: Secondary | ICD-10-CM | POA: Diagnosis not present

## 2020-06-13 DIAGNOSIS — N183 Chronic kidney disease, stage 3 unspecified: Secondary | ICD-10-CM | POA: Diagnosis not present

## 2020-06-15 ENCOUNTER — Telehealth: Payer: Self-pay | Admitting: Cardiology

## 2020-06-15 DIAGNOSIS — I2510 Atherosclerotic heart disease of native coronary artery without angina pectoris: Secondary | ICD-10-CM

## 2020-06-15 DIAGNOSIS — E785 Hyperlipidemia, unspecified: Secondary | ICD-10-CM

## 2020-06-15 MED ORDER — REPATHA SURECLICK 140 MG/ML ~~LOC~~ SOAJ: SUBCUTANEOUS | 11 refills | Status: DC

## 2020-06-15 NOTE — Telephone Encounter (Signed)
Called pt after getting the repatha pa and [employer] grant approved and email confirmation sent to the pt and pt voiced understanding

## 2020-06-15 NOTE — Telephone Encounter (Signed)
New message:     Patient calling to help with Repatha. Please  call patient. Patient was asking [name]

## 2020-06-15 NOTE — Telephone Encounter (Signed)
Patient just moved to Medicare, needs to get PA for continuation of Repatha.  Forward to [employer] to submit

## 2020-06-20 DIAGNOSIS — F325 Major depressive disorder, single episode, in full remission: Secondary | ICD-10-CM | POA: Diagnosis not present

## 2020-06-20 DIAGNOSIS — Z23 Encounter for immunization: Secondary | ICD-10-CM | POA: Diagnosis not present

## 2020-06-20 DIAGNOSIS — Z0000 Encounter for general adult medical examination without abnormal findings: Secondary | ICD-10-CM | POA: Diagnosis not present

## 2020-06-20 DIAGNOSIS — E538 Deficiency of other specified B group vitamins: Secondary | ICD-10-CM | POA: Diagnosis not present

## 2020-06-20 DIAGNOSIS — I129 Hypertensive chronic kidney disease with stage 1 through stage 4 chronic kidney disease, or unspecified chronic kidney disease: Secondary | ICD-10-CM | POA: Diagnosis not present

## 2020-06-20 DIAGNOSIS — N183 Chronic kidney disease, stage 3 unspecified: Secondary | ICD-10-CM | POA: Diagnosis not present

## 2020-06-20 DIAGNOSIS — D649 Anemia, unspecified: Secondary | ICD-10-CM | POA: Diagnosis not present

## 2020-06-20 DIAGNOSIS — I25119 Atherosclerotic heart disease of native coronary artery with unspecified angina pectoris: Secondary | ICD-10-CM | POA: Diagnosis not present

## 2020-06-25 ENCOUNTER — Encounter: Payer: Self-pay | Admitting: Cardiology

## 2020-06-25 NOTE — Assessment & Plan Note (Addendum)
She is somewhat excited about the fact that she is going into almost 10 years from her MI.  No further anginal symptoms.  I encouraged that she stay active.  Plan:   She is now only on aspirin, not on Plavix. ->  If necessary, okay to hold aspirin PRN procedures.  Continue beta-blocker and ACE inhibitor along with aspirin.  Seizure tolerating Repatha well.

## 2020-06-25 NOTE — Assessment & Plan Note (Signed)
Normal carotid Dopplers, echo and monitor.  She denies any further episodes.  At this point we will simply monitor.  If she has another episode, we could consider a loop recorder.

## 2020-06-25 NOTE — Assessment & Plan Note (Signed)
She has slowly started losing weight but very times she loses it comes back.  We talked about staying on the exercise wagon and maintaining her diet.

## 2020-06-25 NOTE — Assessment & Plan Note (Signed)
Her blood pressure is high today which is unusual for her.  Continue to monitor.  We may need to titrate up ACE inhibitor.  She is on metoprolol 50 twice daily and benazepril 5 mg.  She had some daytime fatigue and I suggested she give a trial of reducing morning dose of metoprolol 25 mg.  If this does leave the blood pressure increasing, then will increase benazepril to 10.

## 2020-06-25 NOTE — Assessment & Plan Note (Signed)
9 years and 3 months from her STEMISmall fixed perfusion defect, but normal wall motion and preserved EF of 60 to 65% by echo.

## 2020-06-25 NOTE — Assessment & Plan Note (Addendum)
Clearly, the ABIs as well as aortoiliac Dopplers were suggest that her symptoms are probably not claudication related.  Only abnormal finding was mildly decreased left TBI.  Not enough to explain the redness that she sees on her foot.  Is probably more related to inflammation.

## 2020-06-25 NOTE — Assessment & Plan Note (Signed)
She is due for labs to be rechecked in March.  As of September, LDL was almost to goal-73.  Would like to see it drop below 70.  Discussed dietary modification.  Hopefully with continued weight loss, this will improve.

## 2020-08-17 DIAGNOSIS — K219 Gastro-esophageal reflux disease without esophagitis: Secondary | ICD-10-CM | POA: Diagnosis not present

## 2020-08-17 DIAGNOSIS — F325 Major depressive disorder, single episode, in full remission: Secondary | ICD-10-CM | POA: Diagnosis not present

## 2020-08-17 DIAGNOSIS — D509 Iron deficiency anemia, unspecified: Secondary | ICD-10-CM | POA: Diagnosis not present

## 2020-08-17 DIAGNOSIS — E538 Deficiency of other specified B group vitamins: Secondary | ICD-10-CM | POA: Diagnosis not present

## 2020-12-13 DIAGNOSIS — D649 Anemia, unspecified: Secondary | ICD-10-CM | POA: Diagnosis not present

## 2020-12-13 DIAGNOSIS — N183 Chronic kidney disease, stage 3 unspecified: Secondary | ICD-10-CM | POA: Diagnosis not present

## 2020-12-13 DIAGNOSIS — I25119 Atherosclerotic heart disease of native coronary artery with unspecified angina pectoris: Secondary | ICD-10-CM | POA: Diagnosis not present

## 2020-12-13 DIAGNOSIS — I129 Hypertensive chronic kidney disease with stage 1 through stage 4 chronic kidney disease, or unspecified chronic kidney disease: Secondary | ICD-10-CM | POA: Diagnosis not present

## 2020-12-13 DIAGNOSIS — E538 Deficiency of other specified B group vitamins: Secondary | ICD-10-CM | POA: Diagnosis not present

## 2020-12-19 DIAGNOSIS — I25119 Atherosclerotic heart disease of native coronary artery with unspecified angina pectoris: Secondary | ICD-10-CM | POA: Diagnosis not present

## 2020-12-19 DIAGNOSIS — F325 Major depressive disorder, single episode, in full remission: Secondary | ICD-10-CM | POA: Diagnosis not present

## 2020-12-19 DIAGNOSIS — I129 Hypertensive chronic kidney disease with stage 1 through stage 4 chronic kidney disease, or unspecified chronic kidney disease: Secondary | ICD-10-CM | POA: Diagnosis not present

## 2020-12-19 DIAGNOSIS — N183 Chronic kidney disease, stage 3 unspecified: Secondary | ICD-10-CM | POA: Diagnosis not present

## 2020-12-19 DIAGNOSIS — H532 Diplopia: Secondary | ICD-10-CM | POA: Diagnosis not present

## 2020-12-19 DIAGNOSIS — G250 Essential tremor: Secondary | ICD-10-CM | POA: Diagnosis not present

## 2021-02-27 ENCOUNTER — Ambulatory Visit: Payer: Medicare HMO | Admitting: Cardiology

## 2021-02-27 ENCOUNTER — Other Ambulatory Visit: Payer: Self-pay

## 2021-02-27 ENCOUNTER — Encounter: Payer: Self-pay | Admitting: Cardiology

## 2021-02-27 VITALS — BP 140/80 | HR 54 | Ht 66.0 in | Wt 169.2 lb

## 2021-02-27 DIAGNOSIS — T466X5A Adverse effect of antihyperlipidemic and antiarteriosclerotic drugs, initial encounter: Secondary | ICD-10-CM

## 2021-02-27 DIAGNOSIS — I10 Essential (primary) hypertension: Secondary | ICD-10-CM | POA: Diagnosis not present

## 2021-02-27 DIAGNOSIS — I2510 Atherosclerotic heart disease of native coronary artery without angina pectoris: Secondary | ICD-10-CM

## 2021-02-27 DIAGNOSIS — Z955 Presence of coronary angioplasty implant and graft: Secondary | ICD-10-CM

## 2021-02-27 DIAGNOSIS — E663 Overweight: Secondary | ICD-10-CM | POA: Diagnosis not present

## 2021-02-27 DIAGNOSIS — R002 Palpitations: Secondary | ICD-10-CM

## 2021-02-27 DIAGNOSIS — I2119 ST elevation (STEMI) myocardial infarction involving other coronary artery of inferior wall: Secondary | ICD-10-CM

## 2021-02-27 DIAGNOSIS — M791 Myalgia, unspecified site: Secondary | ICD-10-CM | POA: Diagnosis not present

## 2021-02-27 DIAGNOSIS — E785 Hyperlipidemia, unspecified: Secondary | ICD-10-CM

## 2021-02-27 DIAGNOSIS — Z9861 Coronary angioplasty status: Secondary | ICD-10-CM

## 2021-02-27 MED ORDER — METOPROLOL TARTRATE 50 MG PO TABS: ORAL_TABLET | ORAL | 3 refills | Status: DC

## 2021-02-27 MED ORDER — BENAZEPRIL HCL 10 MG PO TABS: 10.0000 mg | ORAL_TABLET | Freq: Every morning | ORAL | 3 refills | Status: DC

## 2021-02-27 NOTE — Progress Notes (Signed)
Primary Care Provider: Kirk Ruths, MD Cardiologist: Glenetta Hew, MD Electrophysiologist: None  Clinic Note: Chief Complaint  Patient presents with   Follow-up    1-month  Feels better.  Energy level better.   Coronary Artery Disease    No chest pain or pressure.    ===================================  ASSESSMENT/PLAN   Problem List Items Addressed This Visit       Cardiology Problems   ST elevation myocardial infarction (STEMI) of inferior wall, subsequent episode of care ([facility] (Chronic)    10 years out from her MI.  She is doing remarkably well.  Try to get back into being active, but is a little bit fatigued.  Much better with B12.  EF well-preserved with no significant wall motion normality.  Stable regimen of 8 beta-blocker, aspirin And.  Intolerant of statins.      Relevant Medications   benazepril (LOTENSIN) 10 MG tablet   metoprolol tartrate (LOPRESSOR) 50 MG tablet   CAD S/P PCI mRCA: Promus Element DES 3.5 mm x 28 mm (4.2-37 mm)) (Chronic)   Relevant Medications   benazepril (LOTENSIN) 10 MG tablet   metoprolol tartrate (LOPRESSOR) 50 MG tablet   Other Relevant Orders   EKG 12-Lead (Completed)   Hyperlipidemia with target LDL less than 70; Statin Intolerant - Primary (Chronic)    Lipids took a jump this time with LDL increasing.  She is on Repatha but she thinks she missed a dose or 2.  Continue with Repatha, also discussed dietary modifications.  Hopefully with increased exercise level as well..      Relevant Medications   benazepril (LOTENSIN) 10 MG tablet   metoprolol tartrate (LOPRESSOR) 50 MG tablet   Essential hypertension (Chronic)    Borderline blood pressure.  With bradycardia, I cannot push the beta-blocker, titrate like to reduce the dose.  Plan:  Increase benazepril to 10 mg, and reduce daytime dose of metoprolol to 25 mg with continue to take 50. Okay to use additional metoprolol for tachycardia or hypotension.       Relevant Medications   benazepril (LOTENSIN) 10 MG tablet   metoprolol tartrate (LOPRESSOR) 50 MG tablet     Other   Presence of stent in right coronary artery (Chronic)    DES PCI to the RCA in the setting of inferior STEMI.  Now 10 years.  On aspirin beta-blocker and ACE inhibitor.  Okay to hold aspirin 5 days preop for surgeries or procedures.        Relevant Orders   EKG 12-Lead (Completed)   Overweight (BMI 25.0-29.9) (Chronic)    Discussed dietary modification.  Diet changes and exercise.      Palpitations    This individual pretty well previous intermittent palpitation.  30 monitor did not show any significant.  Were to have a tachycardic spell, I recommended she take an additional dose of 25 mg metoprolol.      Myalgia due to statin (Chronic)    Chronic issue.  Started on Repatha years ago.  Still having a difficult time with lipids getting up [place] to LDL less than 55, but has been stable.  Somewhat labile if she misses a dose.  Had tried multiple different statins prior to starting Repatha.       ===================================  HPI:    SREONA ZENDEJASis a 65y.o. female with a PMH of CAD-PCI (inferior STEMI with RCA PCI (02/2011) below who presents today for 158-monthollow-up.  SaHAYLYN HALBERGas last seen on June 02, 2020: She is feeling much better overall from cardiac standpoint.  Happy to see the results of her tests.  Anxious and nervous.  Had some foot discoloration.  Still having palpitations but better.  Recent Hospitalizations: none  Reviewed  CV studies:    The following studies were reviewed today: (if available, images/films reviewed: From Epic Chart or Care Everywhere) none:  Interval History:   DEYONA SOZA returns here today accompanied by her husband.  They are very happy and excited today in great spirits.  She is feeling well.  Energy level is better having started B12.  She still has a little bit of lack of get up and go  during the day but has improved dramatically.  She denies any active sensation of palpitations or irregular heartbeats.  No chest pain or pressure with rest or exertion.  She still has some mild mitral ankle swelling but no more the red discoloration of her feet.  CV Review of Symptoms (Summary) Cardiovascular ROS: no chest pain or dyspnea on exertion positive for - -mild end of day swelling.  Notably improved energy level.  Still somewhat deconditioned.  Not as active as she would like to be.. negative for - chest pain, dyspnea on exertion, edema, orthopnea, palpitations, paroxysmal nocturnal dyspnea, rapid heart rate, shortness of breath, or   syncope/near syncope - although she may get a little lightheaded or tired.  No TIA or amaurosis fugax, claudication.  REVIEWED OF SYSTEMS   Review of Systems  Constitutional:  Positive for weight loss (More active now.). Negative for malaise/fatigue (Energy level is definitely improved especially since starting beta-blocker.).  HENT:  Negative for congestion and nosebleeds.   Respiratory:  Negative for cough and shortness of breath.   Cardiovascular:  Positive for palpitations (Rare) and leg swelling (Still has end of day swelling, but stable.  No longer having the red discoloration.).  Gastrointestinal:  Negative for blood in stool and melena.  Genitourinary:  Negative for hematuria.  Musculoskeletal:  Positive for joint pain (Mild aches and pains). Negative for myalgias.  Neurological:  Positive for dizziness (Occasional dizziness.  Mostly poor balance.) and tremors (Rare). Negative for focal weakness and weakness.  Psychiatric/Behavioral:  Negative for depression and memory loss. The patient is nervous/anxious (Doing better.  More stable). The patient does not have insomnia.    I have reviewed and (if needed) personally updated the patient's problem list, medications, allergies, past medical and surgical history, social and family history.   PAST  MEDICAL HISTORY   Past Medical History:  Diagnosis Date   Anxiety    Arthritis    knees & back    CAD S/P percutaneous coronary angioplasty 03/07/2011   a) PCI of mRCA with Promus Element DES 3.5 mm x 28 mm (4.2 -- 3.7 mm); b) Myoview 04/2011: EF 81% (small LV Cavity), fixed basal-mid Inferior Infarct, No Ischemia, 10 METS   Complication of anesthesia    Depression 03/08/2011   Dr. Milus BanisterLorina Rabon, [facility]   Essential hypertension 03/07/2011   Family history of adverse reaction to anesthesia    GERD (gastroesophageal reflux disease)    Headache    chocolate & red wine triggers /w bad headaches    History of back surgery    Hyperlipidemia with target LDL less than 70 2012    Statin Intolerance (tried Lipitor & Crestor)   Insomnia    without significant sleeping disorders   Iron deficiency anemia 03/09/2011   Neuropathy    PONV (postoperative nausea  and vomiting)    ST-segment elevation myocardial infarction (STEMI) of inferior wall (HCC) 03/07/2011   RCA Occlusion --> PCI of mRCA; ECHO 03/2011: EF > 55%, MIld Inferior HK, Mild AoV Sclerosis.   Vocal cord polyp 2005    PAST SURGICAL HISTORY   Past Surgical History:  Procedure Laterality Date   ABDOMINAL HYSTERECTOMY     APPENDECTOMY  07/04/2000   Normal appendix, adhesions noted.   BACK SURGERY     cyst on spinal cord - lumbar   BREAST BIOPSY Right 01/31/1999   fibrocystic changes, ductal adenosis, focal atypical ductal epithelium.   BREAST EXCISIONAL BIOPSY     CHOLECYSTECTOMY  03/25/1998   Dr. Bary Castilla, chronic cholecystitis.   COLONOSCOPY WITH PROPOFOL N/A 09/28/2014   Procedure: COLONOSCOPY WITH PROPOFOL;  Surgeon: Robert Bellow, MD;  Location[facility] END[facility]COPY;  Service: Endoscopy;  Laterality: N/A;   CORONARY ANGIOPLASTY WITH STENT PLACEMENT  03/07/2011   inferior STEMI - RCA PROMUS 3.5 mm x 28 mm DES  (post Dil 3.7 distal to 4.2 prox)   DENTAL SURGERY  2016   ESOPHAGOGASTRODUODENOSCOPY N/A 09/28/2014   Procedure:  ESOPHAGOGASTRODUODENOSCOPY (EGD);  Surgeon: Robert Bellow, MD;  Location: [facility] ENDOSCOPY;  Service: Endoscopy;  Laterality: N/A;   EYE SURGERY     blepheroplasty- bilateral   JOINT REPLACEMENT     KNEE SURGERY Left 2015   KNEE SURGERY Right 2010   Baker cyst   LEFT HEART CATH AND CORONARY ANGIOGRAPHY  03/08/2011   improved flow from the PCI   LEFT HEART CATHETERIZATION WITH CORONARY ANGIOGRAM N/A 03/07/2011   Procedure: LEFT HEART CATHETERIZATION WITH CORONARY ANGIOGRAM;  Surgeon: Leonie Man, MD;  Location: [facility] CATH LAB;  Service: Cardiovascular;  Laterality: N/A;   LEFT HEART CATHETERIZATION WITH CORONARY ANGIOGRAM N/A 03/08/2011   Procedure: LEFT HEART CATHETERIZATION WITH CORONARY ANGIOGRAM;  Surgeon: Leonie Man, MD;  Location: [facility] CATH LAB;  Service: Cardiovascular;  Laterality: N/A;   NM MYOCAR PERF WALL MOTION  08/2016   Lexiscan: LOW RISK.  No ischemia or infarction.  Hyperdynamic LV function .  EF> 65%   skin nodule Left 09/11/1999   dermatofibroma left lower leg, positive lateral margin.   TOTAL KNEE ARTHROPLASTY Bilateral 07/20/2015   Procedure: TOTAL KNEE BILATERAL;  Surgeon: Ninetta Lights, MD;  Location: [place];  Service: Orthopedics;  Laterality: Bilateral;   TRANSTHORACIC ECHOCARDIOGRAM  03/27/2011   LV cavity is small,EF =>55%,MILD INFERIOR HYPOKINESIS; Mild Aortic Sclerosis   TRANSTHORACIC ECHOCARDIOGRAM  04/22/2020    EF 60-65%.  GRII DD.  No RWM A.  Normal RV.  Mild LA dilation.  (This is not consistent with GRII D), mild ascending aorta dilation of 38 mm.  Otherwise normal.   UPPER GI ENDOSCOPY  02/22/2003   Dr. Bary Castilla, normal    Immunization History  Administered Date(s) Administered   Influenza Split 03/08/2011   Pneumococcal Polysaccharide-23 03/08/2011   Tdap 03/11/2017    MEDICATIONS/ALLERGIES   Current Meds  Medication Sig   aspirin EC 81 MG tablet Take 1 tablet (81 mg total) by mouth daily.   benazepril (LOTENSIN) 5 MG tablet Take 1  tablet (5 mg total) by mouth daily before breakfast.   buPROPion (WELLBUTRIN XL) 300 MG 24 hr tablet TAKE 1 TABLET EVERY MORNING   diazepam (VALIUM) 5 MG tablet Only takes when going to dentist.   Evolocumab (REPATHA SURECLICK) 701 MG/ML SOAJ INJECT 140MG SUBCUTANEOUSLY EVERY TWO WEEKS   FLUoxetine (PROZAC) 10 MG capsule TAKE 1 CAPSULE EVERY DAY  WITH  40MG   FLUoxetine (PROZAC) 40 MG capsule TAKE 1 CAPSULE EVERY DAY BEFORE BREAKFAST  WITH  10MG  CAP   gabapentin (NEURONTIN) 300 MG capsule Take 2 capsules (600 mg total) by mouth at bedtime.   metoprolol tartrate (LOPRESSOR) 50 MG tablet TAKE 1 TABLET TWICE DAILY   nitroGLYCERIN (NITROSTAT) 0.4 MG SL tablet Place 1 tablet (0.4 mg total) under the tongue every 5 (five) minutes as needed for chest pain (up to 3 tabs in 15 mins and then call 911).   omeprazole (PRILOSEC) 20 MG capsule TAKE 1 CAPSULE(20 MG) BY MOUTH DAILY    Allergies  Allergen Reactions   Bee Venom Anaphylaxis   Statins Other (See Comments)    Myalgias & fatigue   Etodolac    Prednisone Palpitations and Other (See Comments)    Can take small amounts. HR increase; increased energy    Sulfa Antibiotics Other (See Comments)    Elevated B/P, skin turns red    SOCIAL HISTORY/FAMILY HISTORY   Reviewed in Epic:  Pertinent findings:  Social History   Tobacco Use   Smoking status: Former    Packs/day: 0.25    Years: 40.00    Pack years: 10.00    Types: Cigarettes    Quit date: 03/07/2011    Years since quitting: 10.0   Smokeless tobacco: Never  Vaping Use   Vaping Use: Never used  Substance Use Topics   Alcohol use: No    Alcohol/week: 0.0 standard drinks   Drug use: No   Social History   Social History Narrative   Divorced woman -- Now Remarried to her long-term partner.   Exercises routinely on a daily basis roughly 45 minutes a day walking.  Quit smoking at the time of her MI. Does not drink.    OBJCTIVE -PE, EKG, labs   Wt Readings from Last 3  Encounters:  02/27/21 169 lb 3.2 oz (76.7 kg)  06/02/20 174 lb (78.9 kg)  03/23/20 171 lb (77.6 kg)    Physical Exam: BP 140/80 (BP Location: Right Arm)   Pulse (!) 54   Ht '5\' 6"'  (1.676 m)   Wt 169 lb 3.2 oz (76.7 kg)   SpO2 98%   BMI 27.31 kg/m  Physical Exam Vitals reviewed.  Constitutional:      General: She is not in acute distress.    Appearance: Normal appearance. She is normal weight. She is not ill-appearing or toxic-appearing.  HENT:     Head: Normocephalic and atraumatic.  Neck:     Vascular: No carotid bruit.  Cardiovascular:     Rate and Rhythm: Normal rate and regular rhythm.     Pulses: Normal pulses.     Heart sounds: Murmur (1/6 SEM at RUSB) heard.    No friction rub.  Pulmonary:     Effort: Pulmonary effort is normal. No respiratory distress.     Breath sounds: Normal breath sounds.  Musculoskeletal:        General: No swelling. Normal range of motion.     Cervical back: Normal range of motion and neck supple.  Lymphadenopathy:     Cervical: No cervical adenopathy.  Neurological:     General: No focal deficit present.     Mental Status: She is alert and oriented to person, place, and time.     Gait: Gait normal.  Psychiatric:        Mood and Affect: Mood normal.        Behavior: Behavior normal.  Thought Content: Thought content normal.        Judgment: Judgment normal.    Adult ECG Report  Rate: 54 ;  Rhythm: sinus bradycardia and normal axis intervals & durations ;   Narrative Interpretation: stable  Recent Labs:   [place] Related to Lipid Panel w/calc LDL Component 12/13/20 06/13/20 12/10/19  Cholesterol, Total 173 158 167  Triglyceride 49 66 72  HDL (High Density Lipoprotein) Cholesterol 83.7 78.7 79.7  LDL Calculated 80 66 73   Comprehensive Metabolic Panel (CMP) Component 12/13/20 06/13/20 12/10/19  Glucose 82 94 87  Sodium 143 142 139  Potassium 4.7 4.5 4.5  Chloride 105 105 103  Carbon Dioxide (CO2)  30.5 31.7 31.7  Urea Nitrogen (BUN) '15 13 15  ' Creatinine 0.9 0.9 1.1  Glomerular Filtration Rate (eGFR), MDRD Estimate 63 63 50 Low   Calcium 9.5 8.9 8.9  AST  '16 15 15  ' ALT  '14 12 12  ' Alk Phos (alkaline Phosphatase) 87 80 85  Albumin 4.0 3.9 3.8  Bilirubin, Total 0.5 0.3 0.3  Protein, Total 6.2 6.0 Low  6.1   CBC w/auto Differential (5 Part) Component 12/13/20 08/17/20  06/13/20  WBC (White Blood Cell Count) 4.7 5.3 4.6  RBC (Red Blood Cell Count) 3.94 Low  4.09 3.84 Low   Hemoglobin 12.0 11.6 Low  10.2 Low   Hematocrit 37.4 37.3 34.0 Low   MCV (Mean Corpuscular Volume) 94.9 91.2 88.5  MCH (Mean Corpuscular Hemoglobin) 30.5 28.4 26.6 Low   MCHC (Mean Corpuscular Hemoglobin Concentration) 32.1 31.1 Low  30.0 Low   Platelet Count 213 235 221    Lab Results  Component Value Date   HGBA1C 4.9 08/13/2016    ==================================================  COVID-19 Education: The signs and symptoms of COVID-19 were discussed with the patient and how to seek care for testing (follow up with PCP or arrange E-visit).    I spent a total of 25 minutes with the patient spent in direct patient consultation.  Additional time spent with chart review  / charting (studies, outside notes, etc): 12 min Total Time: 30 min  Current medicines are reviewed at length with the patient today.  (+/- concerns) N/A  This visit occurred during the SARS-CoV-2 public health emergency.  Safety protocols were in place, including screening questions prior to the visit, additional usage of staff PPE, and extensive cleaning of exam room while observing appropriate contact time as indicated for disinfecting solutions.  Notice: This dictation was prepared with Dragon dictation along with smart phrase technology. Any transcriptional errors that result from this process are unintentional and may not be corrected upon review.  Studies Ordered:   Orders Placed This Encounter  Procedures   EKG 12-Lead      Patient Instructions / Medication Changes & Studies & Tests Ordered   Patient Instructions  Medication Instructions:    Lopresor/Metoprolol tartrate 25 mg ( 1/2 tablet of 50 mg)  in the morning  and 50 mg at bedtime    Increase Benazepril to 10 mg   in the morning    *If you need a refill on your cardiac medications before your next appointment, please call your pharmacy*   Lab Work:  Not needed   Testing/Procedures:  Not needed  Follow-Up: At [facility], you and your health needs are our priority.  As part of our continuing mission to provide you with exceptional heart care, we have created designated Provider Care Teams.  These Care Teams include your primary  Cardiologist (physician) and Advanced Practice Providers (APPs -  Physician Assistants and Nurse Practitioners) who all work together to provide you with the care you need, when you need it.     Your next appointment:   12 month(s)  The format for your next appointment:   In Person  Provider:   Glenetta Hew, MD        Glenetta Hew, M.D., M.S. Interventional Cardiologist   Pager # [phone] Phone # [phone] [address] [place], [place] [address]   Thank you for choosing Heartcare at [facility]!!

## 2021-02-27 NOTE — Patient Instructions (Signed)
Medication Instructions:    Lopresor/Metoprolol tartrate 25 mg ( 1/2 tablet of 50 mg)  in the morning  and 50 mg at bedtime    Increase Benazepril to 10 mg   in the morning    *If you need a refill on your cardiac medications before your next appointment, please call your pharmacy*   Lab Work:  Not needed   Testing/Procedures:  Not needed  Follow-Up: At [facility], you and your health needs are our priority.  As part of our continuing mission to provide you with exceptional heart care, we have created designated Provider Care Teams.  These Care Teams include your primary Cardiologist (physician) and Advanced Practice Providers (APPs -  Physician Assistants and Nurse Practitioners) who all work together to provide you with the care you need, when you need it.     Your next appointment:   12 month(s)  The format for your next appointment:   In Person  Provider:   Bryan Lemma, MD

## 2021-03-15 ENCOUNTER — Telehealth: Payer: Self-pay | Admitting: Cardiology

## 2021-03-15 NOTE — Telephone Encounter (Signed)
  Financial planner with [facility]. She said they faxed some paperwork to Dr. Herbie Baltimore suggesting, if he wants the pt be on moderate to high intensity statin drug. She would like to know if Dr. Herbie Baltimore receive it

## 2021-03-15 NOTE — Telephone Encounter (Signed)
Spoke to Pharmacist from [facility] - RN informed Dr Herbie Baltimore had received information. Patient  has an intolerance to statins-  myalgias and fatigue.patient is now on Repatha.  Pharmacist stated he would update patient records. Pharmacist also indicated to  place dx on her problem list

## 2021-03-18 ENCOUNTER — Encounter: Payer: Self-pay | Admitting: Cardiology

## 2021-03-18 DIAGNOSIS — T466X5A Adverse effect of antihyperlipidemic and antiarteriosclerotic drugs, initial encounter: Secondary | ICD-10-CM | POA: Insufficient documentation

## 2021-03-18 DIAGNOSIS — M791 Myalgia, unspecified site: Secondary | ICD-10-CM | POA: Insufficient documentation

## 2021-03-18 NOTE — Assessment & Plan Note (Signed)
Borderline blood pressure.  With bradycardia, I cannot push the beta-blocker, titrate like to reduce the dose.  Plan:   Increase benazepril to 10 mg, and reduce daytime dose of metoprolol to 25 mg with continue to take 50.  Okay to use additional metoprolol for tachycardia or hypotension.

## 2021-03-18 NOTE — Assessment & Plan Note (Signed)
Lipids took a jump this time with LDL increasing.  She is on Repatha but she thinks she missed a dose or 2.  Continue with Repatha, also discussed dietary modifications.  Hopefully with increased exercise level as well.Marland Kitchen

## 2021-03-18 NOTE — Assessment & Plan Note (Signed)
Chronic issue.  Started on Repatha years ago.  Still having a difficult time with lipids getting up [place] to LDL less than 55, but has been stable.  Somewhat labile if she misses a dose.  Had tried multiple different statins prior to starting Repatha.

## 2021-03-18 NOTE — Assessment & Plan Note (Signed)
This individual pretty well previous intermittent palpitation.  30 monitor did not show any significant.  Were to have a tachycardic spell, I recommended she take an additional dose of 25 mg metoprolol.

## 2021-03-18 NOTE — Assessment & Plan Note (Signed)
DES PCI to the RCA in the setting of inferior STEMI.  Now 10 years.  On aspirin beta-blocker and ACE inhibitor.   Okay to hold aspirin 5 days preop for surgeries or procedures.

## 2021-03-18 NOTE — Assessment & Plan Note (Signed)
10 years out from her MI.  She is doing remarkably well.  Try to get back into being active, but is a little bit fatigued.  Much better with B12.  EF well-preserved with no significant wall motion normality.  Stable regimen of 8 beta-blocker, aspirin And.  Intolerant of statins.

## 2021-03-18 NOTE — Assessment & Plan Note (Signed)
Discussed dietary modification.  Diet changes and exercise.

## 2021-05-09 ENCOUNTER — Other Ambulatory Visit: Payer: Self-pay | Admitting: Cardiology

## 2021-05-31 ENCOUNTER — Other Ambulatory Visit: Payer: Self-pay | Admitting: Cardiology

## 2021-05-31 ENCOUNTER — Telehealth: Payer: Self-pay | Admitting: Cardiology

## 2021-05-31 DIAGNOSIS — E785 Hyperlipidemia, unspecified: Secondary | ICD-10-CM

## 2021-05-31 DIAGNOSIS — I2510 Atherosclerotic heart disease of native coronary artery without angina pectoris: Secondary | ICD-10-CM

## 2021-05-31 NOTE — Telephone Encounter (Signed)
Patient called about her [place].  She thinks the grant might have ran out as the pharmacy is asking for money for the repatha.

## 2021-06-01 NOTE — Telephone Encounter (Signed)
Called and spoke w/pt and hwf approved and given to pt

## 2021-06-01 NOTE — Telephone Encounter (Signed)
Grant reapproved will call pt after 8 am  PATIENT Jessica Landry   STATUS  Active   START DATE 05/16/2021   END DATE 05/15/2022   ASSISTANCE TYPE Co-pay   PAID $0.00   PENDING $0.00   BALANCE $2500.00 Pharmacy Card CARD NO. [id number]   CARD STATUS Active   BIN [id number]   PCN [id number]   PC GROUP [id number]   HELP DESK [id number]   PROVIDER PDMI   PROCESSOR PDMI

## 2021-06-28 ENCOUNTER — Other Ambulatory Visit: Payer: Self-pay | Admitting: Internal Medicine

## 2021-06-28 DIAGNOSIS — Z1231 Encounter for screening mammogram for malignant neoplasm of breast: Secondary | ICD-10-CM

## 2021-07-14 ENCOUNTER — Other Ambulatory Visit: Payer: Self-pay

## 2021-07-14 MED ORDER — BENAZEPRIL HCL 10 MG PO TABS: 10.0000 mg | ORAL_TABLET | Freq: Every morning | ORAL | 3 refills | Status: DC

## 2021-08-04 ENCOUNTER — Ambulatory Visit
Admission: RE | Admit: 2021-08-04 | Discharge: 2021-08-04 | Disposition: A | Discharge status: Home / Self Care | Payer: Medicare PPO | Source: Ambulatory Visit | Attending: Internal Medicine | Admitting: Internal Medicine

## 2021-08-04 DIAGNOSIS — Z1231 Encounter for screening mammogram for malignant neoplasm of breast: Secondary | ICD-10-CM | POA: Diagnosis present

## 2022-01-18 ENCOUNTER — Encounter: Payer: Self-pay | Admitting: Cardiology

## 2022-01-18 NOTE — Telephone Encounter (Signed)
Error

## 2022-04-25 ENCOUNTER — Other Ambulatory Visit: Payer: Self-pay | Admitting: Orthopedic Surgery

## 2022-04-27 ENCOUNTER — Telehealth: Payer: Self-pay | Admitting: Cardiology

## 2022-04-27 DIAGNOSIS — E785 Hyperlipidemia, unspecified: Secondary | ICD-10-CM

## 2022-04-27 DIAGNOSIS — I2510 Atherosclerotic heart disease of native coronary artery without angina pectoris: Secondary | ICD-10-CM

## 2022-04-27 NOTE — Telephone Encounter (Signed)
Patient stated she needs assistance again for repatha.

## 2022-04-27 NOTE — Telephone Encounter (Signed)
Patient called to say she would like her Repatha script sent to the [facility] at [address]. [employer] in [place].

## 2022-04-27 NOTE — Telephone Encounter (Signed)
Pt c/o medication issue:  1. Name of Medication:   Evolocumab (REPATHA SURECLICK) 388 MG/ML SOAJ    2. How are you currently taking this medication (dosage and times per day)?  As written   3. Are you having a reaction (difficulty breathing--STAT)?   4. What is your medication issue? Pt called in stating she usually has a grant for this medication and needs it renewed.

## 2022-04-30 ENCOUNTER — Other Ambulatory Visit (HOSPITAL_COMMUNITY): Payer: Self-pay

## 2022-05-01 MED ORDER — REPATHA SURECLICK 140 MG/ML ~~LOC~~ SOAJ: SUBCUTANEOUS | 3 refills | Status: DC

## 2022-05-01 NOTE — Telephone Encounter (Signed)
Spoke with patient, [employer] grant updated to 05/16/2023.  New ID [id number].  Rx sent to [facility] in [place].

## 2022-05-02 ENCOUNTER — Telehealth: Payer: Self-pay | Admitting: *Deleted

## 2022-05-02 NOTE — Telephone Encounter (Signed)
   Name: Jessica Landry  DOB: [birth year]  MRN: [id number]  Primary Cardiologist: Glenetta Hew, MD  Chart reviewed as part of pre-operative protocol coverage. Because of Tian Davison Fouty's past medical history and time since last visit, she will require a follow-up in-office visit in order to better assess preoperative cardiovascular risk.  Pre-op covering staff: - Please schedule appointment and call patient to inform them. If patient already had an upcoming appointment within acceptable timeframe, please add "pre-op clearance" to the appointment notes so provider is aware. - Please contact requesting surgeon's office via preferred method (i.e, phone, fax) to inform them of need for appointment prior to surgery.   Lenna Sciara, NP  05/02/2022, 10:55 AM

## 2022-05-02 NOTE — Telephone Encounter (Signed)
   Pre-operative Risk Assessment    Patient Name: Jessica Landry  DOB: [birth year] MRN: [id number]      Request for Surgical Clearance    Procedure:   LEFT SIDED L3-4 TRANSFORAMINAL LUMBAR  INTERBODY FUSION AND DECOMPRESSION WITH INSTRUMENTATION AND ALLOGRAFT   Date of Surgery:  Clearance 05/10/22                                 Surgeon:  DR. MARK DUMONSKI Surgeon's Group or Practice Name:  [facility] Phone number:  [phone] Fax number:  [phone] ATTN: Neita Garnet   Type of Clearance Requested:   - Medical ; ASA   Type of Anesthesia:  General    Additional requests/questions:    Jiles Prows   05/02/2022, 10:38 AM

## 2022-05-02 NOTE — Telephone Encounter (Signed)
Patient scheduled for 05-07-22 with Nicholes Rough 245p

## 2022-05-03 NOTE — Progress Notes (Signed)
Surgical Instructions    Your procedure is scheduled on Thursday, 05/10/22.  Report to [facility] "A" at 5:30 A.M., then check in with the Admitting office.  Call this number if you have problems the morning of surgery:  [phone]   If you have any questions prior to your surgery date call [phone]: Open Monday-Friday 8am-4pm If you experience any cold or flu symptoms such as cough, fever, chills, shortness of breath, etc. between now and your scheduled surgery, please notify [facility] at the above number     Remember:  Do not eat after midnight the night before your surgery  You may drink clear liquids until 4:30am the morning of your surgery.   Clear liquids allowed are: Water, Non-Citrus Juices (without pulp), Carbonated Beverages, Clear Tea, Black Coffee ONLY (NO MILK, CREAM OR POWDERED CREAMER of any kind), and Gatorade  Patient Instructions  The night before surgery:  No food after midnight. ONLY clear liquids after midnight  The day of surgery (if you do NOT have diabetes):  Drink ONE (1) Pre-Surgery Clear Ensure by 4:30am the morning of surgery. Drink in one sitting. Do not sip.  This drink was given to you during your [facility]  pre-op appointment visit. Nothing else to drink after completing the  Pre-Surgery Clear Ensure.           If you have questions, please contact your surgeon's office.     Take these medicines the morning of surgery with A SIP OF WATER:  buPROPion (WELLBUTRIN XL)  FLUoxetine (PROZAC)  metoprolol tartrate (LOPRESSOR)  omeprazole (PRILOSEC)   IF NEEDED: acetaminophen (TYLENOL)  fluticasone (FLONASE)  tiZANidine (ZANAFLEX)   As of today, STOP taking any Aspirin (unless otherwise instructed by your surgeon) Aleve, Naproxen, Ibuprofen, Motrin, Advil, Goody's, BC's, all herbal medications, fish oil, and all vitamins.           Do not wear jewelry or makeup. Do not wear lotions, powders, perfumes or deodorant. Do not shave 48  hours prior to surgery.   Do not bring valuables to the [facility]. Do not wear nail polish, gel polish, artificial nails, or any other type of covering on natural nails (fingers and toes) If you have artificial nails or gel coating that need to be removed by a nail salon, please have this removed prior to surgery. Artificial nails or gel coating may interfere with anesthesia's ability to adequately monitor your vital signs.  [facility] is not responsible for any belongings or valuables.    Do NOT Smoke (Tobacco/Vaping)  24 hours prior to your procedure  If you use a CPAP at night, you may bring your mask for your overnight stay.   Contacts, glasses, hearing aids, dentures or partials may not be worn into surgery, please bring cases for these belongings   For patients admitted to the [facility], discharge time will be determined by your treatment team.   Patients discharged the day of surgery will not be allowed to drive home, and someone needs to stay with them for 24 hours.   SURGICAL WAITING ROOM VISITATION Patients having surgery or a procedure may have no more than 2 support people in the waiting area - these visitors may rotate.   Children under the age of 85 must have an adult with them who is not the patient. If the patient needs to stay at the [facility] during part of their recovery, the visitor guidelines for inpatient rooms apply. Pre-op nurse will coordinate an appropriate time for 1  support person to accompany patient in pre-op.  This support person may not rotate.   Please refer to [email or web address] for the visitor guidelines for Inpatients (after your surgery is over and you are in a regular room).    Special instructions:    Oral Hygiene is also important to reduce your risk of infection.  Remember - BRUSH YOUR TEETH THE MORNING OF SURGERY WITH YOUR REGULAR TOOTHPASTE   [facility]- Preparing For Surgery  Before  surgery, you can play an important role. Because skin is not sterile, your skin needs to be as free of germs as possible. You can reduce the number of germs on your skin by washing with CHG (chlorahexidine gluconate) Soap before surgery.  CHG is an antiseptic cleaner which kills germs and bonds with the skin to continue killing germs even after washing.     Please do not use if you have an allergy to CHG or antibacterial soaps. If your skin becomes reddened/irritated stop using the CHG.  Do not shave (including legs and underarms) for at least 48 hours prior to first CHG shower. It is OK to shave your face.  Please follow these instructions carefully.     Shower the NIGHT BEFORE SURGERY and the MORNING OF SURGERY with CHG Soap.   If you chose to wash your hair, wash your hair first as usual with your normal shampoo. After you shampoo, rinse your hair and body thoroughly to remove the shampoo.  Then [employer] and genitals (private parts) with your normal soap and rinse thoroughly to remove soap.  After that Use CHG Soap as you would any other liquid soap. You can apply CHG directly to the skin and wash gently with a scrungie or a clean washcloth.   Apply the CHG Soap to your body ONLY FROM THE NECK DOWN.  Do not use on open wounds or open sores. Avoid contact with your eyes, ears, mouth and genitals (private parts). Wash Face and genitals (private parts)  with your normal soap.   Wash thoroughly, paying special attention to the area where your surgery will be performed.  Thoroughly rinse your body with warm water from the neck down.  DO NOT shower/wash with your normal soap after using and rinsing off the CHG Soap.  Pat yourself dry with a CLEAN TOWEL.  Wear CLEAN PAJAMAS to bed the night before surgery  Place CLEAN SHEETS on your bed the night before your surgery  DO NOT SLEEP WITH PETS.   Day of Surgery: Take a shower with CHG soap. Wear Clean/Comfortable clothing the morning of  surgery Do not apply any deodorants/lotions.   Remember to brush your teeth WITH YOUR REGULAR TOOTHPASTE.    If you received a COVID test during your pre-op visit, it is requested that you wear a mask when out in public, stay away from anyone that may not be feeling well, and notify your surgeon if you develop symptoms. If you have been in contact with anyone that has tested positive in the last 10 days, please notify your surgeon.    Please read over the following fact sheets that you were given.

## 2022-05-04 ENCOUNTER — Encounter (HOSPITAL_COMMUNITY)
Admission: RE | Admit: 2022-05-04 | Discharge: 2022-05-04 | Disposition: A | Discharge status: Home / Self Care | Payer: Medicare PPO | Source: Ambulatory Visit | Attending: Orthopedic Surgery | Admitting: Orthopedic Surgery

## 2022-05-04 ENCOUNTER — Other Ambulatory Visit: Payer: Self-pay

## 2022-05-04 VITALS — BP 141/75 | HR 64 | Temp 97.6°F | Resp 18 | Ht 67.0 in | Wt 162.0 lb

## 2022-05-04 DIAGNOSIS — I10 Essential (primary) hypertension: Secondary | ICD-10-CM | POA: Diagnosis not present

## 2022-05-04 DIAGNOSIS — I2510 Atherosclerotic heart disease of native coronary artery without angina pectoris: Secondary | ICD-10-CM | POA: Diagnosis not present

## 2022-05-04 DIAGNOSIS — M79604 Pain in right leg: Secondary | ICD-10-CM | POA: Insufficient documentation

## 2022-05-04 DIAGNOSIS — I252 Old myocardial infarction: Secondary | ICD-10-CM | POA: Diagnosis not present

## 2022-05-04 DIAGNOSIS — Z96653 Presence of artificial knee joint, bilateral: Secondary | ICD-10-CM | POA: Insufficient documentation

## 2022-05-04 DIAGNOSIS — E785 Hyperlipidemia, unspecified: Secondary | ICD-10-CM | POA: Diagnosis not present

## 2022-05-04 DIAGNOSIS — Z01818 Encounter for other preprocedural examination: Secondary | ICD-10-CM | POA: Diagnosis present

## 2022-05-04 DIAGNOSIS — K219 Gastro-esophageal reflux disease without esophagitis: Secondary | ICD-10-CM | POA: Insufficient documentation

## 2022-05-04 DIAGNOSIS — Z87891 Personal history of nicotine dependence: Secondary | ICD-10-CM | POA: Insufficient documentation

## 2022-05-04 DIAGNOSIS — M48062 Spinal stenosis, lumbar region with neurogenic claudication: Secondary | ICD-10-CM | POA: Insufficient documentation

## 2022-05-04 DIAGNOSIS — M79605 Pain in left leg: Secondary | ICD-10-CM | POA: Diagnosis not present

## 2022-05-04 LAB — BASIC METABOLIC PANEL
Anion gap: 5 (ref 5–15)
BUN: 10 mg/dL (ref 8–23)
CO2: 30 mmol/L (ref 22–32)
Calcium: 9.1 mg/dL (ref 8.9–10.3)
Chloride: 102 mmol/L (ref 98–111)
Creatinine, Ser: 1.13 mg/dL — ABNORMAL HIGH (ref 0.44–1.00)
GFR, Estimated: 54 mL/min — ABNORMAL LOW (ref >60)
Glucose, Bld: 84 mg/dL (ref 70–99)
Potassium: 3.8 mmol/L (ref 3.5–5.1)
Sodium: 137 mmol/L (ref 135–145)

## 2022-05-04 LAB — TYPE AND SCREEN
ABO/RH(D): A POS
Antibody Screen: NEGATIVE

## 2022-05-04 LAB — CBC
HCT: 36.1 % (ref 36.0–46.0)
Hemoglobin: 11.7 g/dL — ABNORMAL LOW (ref 12.0–15.0)
MCH: 30.9 pg (ref 26.0–34.0)
MCHC: 32.4 g/dL (ref 30.0–36.0)
MCV: 95.3 fL (ref 80.0–100.0)
Platelets: 285 10*3/uL (ref 150–400)
RBC: 3.79 MIL/uL — ABNORMAL LOW (ref 3.87–5.11)
RDW: 13.4 % (ref 11.5–15.5)
WBC: 5.6 10*3/uL (ref 4.0–10.5)
nRBC: 0 % (ref 0.0–0.2)

## 2022-05-04 LAB — SURGICAL PCR SCREEN
MRSA, PCR: NEGATIVE
Staphylococcus aureus: NEGATIVE

## 2022-05-04 NOTE — Progress Notes (Signed)
PCP - Frazier Richards, [facility] Cardiologist - Dr. Glenetta Hew  Chest x-ray - n/a EKG - pending today Stress Test - 2018 ECHO - 04/22/2020 Cardiac Cath - 2012  ERAS Protcol - PRE-SURGERY Ensure or G2- Ensure   Anesthesia review: Cardiac clearance appt scheduled for 05/07/22.  Patient denies shortness of breath, fever, cough and chest pain at PAT appointment   All instructions explained to the patient, with a verbal understanding of the material. Patient agrees to go over the instructions while at home for a better understanding. Patient also instructed to self quarantine after being tested for COVID-19. The opportunity to ask questions was provided.

## 2022-05-04 NOTE — Progress Notes (Signed)
PCP - Frazier Richards, [facility] Cardiologist - Dr. Glenetta Hew  Chest x-ray - n/a EKG - pending today Stress Test - 2018 ECHO - 04/22/2020 Cardiac Cath - 2012  ERAS Protcol - PRE-SURGERY Ensure or G2- Ensure   Anesthesia review: Cardiac clearance 05/02/2022  Patient denies shortness of breath, fever, cough and chest pain at PAT appointment   All instructions explained to the patient, with a verbal understanding of the material. Patient agrees to go over the instructions while at home for a better understanding. Patient also instructed to self quarantine after being tested for COVID-19. The opportunity to ask questions was provided.

## 2022-05-07 ENCOUNTER — Ambulatory Visit: Payer: Medicare PPO | Admitting: Physician Assistant

## 2022-05-07 NOTE — Progress Notes (Signed)
Anesthesia Chart Review:  Case: [id number] Date/Time: 05/10/22 0715   Procedure: LEFT-SIDED LUMBAR 3 - LUMBAR 4 TRANSFORAMINAL LUMBAR INTERBODY FUSION AND DECOMPRESSION WITH INSTRUMENTATION AND ALLOGRAFT (Left)   Anesthesia type: General   Pre-op diagnosis: Bilateral leg pain with a history very consistent with neurogenic claudication and lumbar radiculopathy.  The patient's MRI is notable for instability and stenosis at L3-L4.   [facility]: [facility] 05 / [facility]   Surgeons: Estill Bamberg, MD       DISCUSSION: Patient is a 67 year old female scheduled for the above procedures.  History includes former smoker (quit 03/07/11), post-operative N/V, CAD (STEMI, s/p DES mRCA 03/07/11), HLD (statin intolerance), GERD, anemia, vocal cord polyp, hysterectomy, spinal surgery, osteoarthritis (bilateral TKA 07/19/25). S/p excision of right axillary inclusion cyst on 04/26/22 by Dr. Lavonia Drafts Diaz--she had required treatment with antibiotic earlier in the month (FYI message left for Lupita Leash at Dr. Marshell Levan office regarding this).    Lasts office visit with cardiologist Dr. Herbie Baltimore was on 02/27/21. She was doing well at that time. She is scheduled for a virtual preoperative cardiology evaluation on 05/08/22 with Jari Favre, PA-C. Chart will be left for follow-up. Non-ischemic stress test in 2018. Overall benign event monitor in 04/2020. 04/22/20 echo showed LVEF 60-65%, no regional wall motion abnormalities, grade II diastolic dysfunction, normal RVSF, normal PASP, trivial MR, mild AI, ascending aorta 38 mm.   ASA on hold for surgery.   VS: BP (!) 141/75   Pulse 64   Temp 36.4 C (Oral)   Resp 18   Ht 5\' 7"  (1.702 m)   Wt 73.5 kg   SpO2 100%   BMI 25.37 kg/m    PROVIDERS: , MD is PCP  Lauro Regulus, MD is cardiologist   LABS: Labs reviewed: Acceptable for surgery. (all labs ordered are listed, but only abnormal results are displayed)  Labs Reviewed  BASIC METABOLIC PANEL -  Abnormal; Notable for the following components:      Result Value   Creatinine, Ser 1.13 (*)    GFR, Estimated 54 (*)    All other components within normal limits  CBC - Abnormal; Notable for the following components:   RBC 3.79 (*)    Hemoglobin 11.7 (*)    All other components within normal limits  SURGICAL PCR SCREEN  TYPE AND SCREEN     EKG: 05/04/22: Sinus bradycardia at 59 bpm Nonspecific ST abnormality Abnormal ECG When compared with ECG of 13-Aug-2016 08:43, PREVIOUS ECG IS PRESENT No significant change since last tracing Confirmed by 15-Aug-2016 [phone]) on 05/04/2022 1:13:48 PM    CV: Long term event monitor 04/07/20 - 05/06/20: Predominant rhythm was sinus rhythm-sinus bradycardia: Minimum heart rate 47 bpm, maximum heart rate 93 bpm. Overall average 59 bpm. No evidence of atrial fibrillation, or any other significant arrhythmia such as atrial flutter, SVT, VT. Rare PVCs and PACs. On occasions, the computer read 2 AVB-type II, likely just sinus bradycardia with monitor QT interval. 48 triggers noted. 16 were patient triggered. 32 auto trigger. Patient triggered events mostly with sinus bradycardia, occasionally with interpolated PVCs. Majority of symptoms were with sinus rhythm/artifact, no true arrhythmias noted. "Critical findings "; not were actually simply brought sinus bradycardia; not significant bradycardia.   - Monitor duration 04/07/2020-05/06/2020  - Overall relatively benign study despite multiple computer reads.  Symptoms noted mostly with sinus rhythm/sinus bradycardia, occasional PVCs. - Would not change management.   Echo 04/22/20:  1. Left ventricular ejection fraction, by  estimation, is 60 to 65%. The  left ventricle has normal function. The left ventricle has no regional  wall motion abnormalities. Left ventricular diastolic parameters are  consistent with Grade II diastolic  dysfunction (pseudonormalization). The average left ventricular  global  longitudinal strain is -25.2 %. The global longitudinal strain is normal.   2. Right ventricular systolic function is normal. The right ventricular  size is normal. There is normal pulmonary artery systolic pressure.   3. Left atrial size was mildly dilated.   4. The mitral valve is normal in structure. Trivial mitral valve  regurgitation. No evidence of mitral stenosis.   5. The aortic valve is tricuspid. Aortic valve regurgitation is mild. No  aortic stenosis is present.   6. Aortic dilatation noted. There is mild dilatation of the ascending  aorta, measuring 38 mm.   7. The inferior vena cava is normal in size with greater than 50%  respiratory variability, suggesting right atrial pressure of 3 mmHg.  - Comparison(s): Report only 03/27/11 EF >55%.    [name] Abd Aorta/Iliac 04/15/20: Summary:  Abdominal Aorta: No evidence of an abdominal aortic aneurysm was visualized. The largest aortic measurement is 2.3 cm.  Stenosis:  Mild aorta-iliac atherosclerosis, without evidence of significant  stenosis.  IVC/Iliac: There is no evidence of thrombus involving the IVC.    US Carotid 04/11/20: Summary:  - Right Carotid: Velocities in the right ICA are consistent with a 1-39% stenosis.  - Left Carotid: Velocities in the left ICA are consistent with a 1-39% stenosis.  - Vertebrals: Bilateral vertebral arteries demonstrate antegrade flow.  - Subclavians: Normal flow hemodynamics were seen in bilateral subclavian arteries.    Nuclear stress test 08/14/16: Low risk study without evidence of ischemia or infarction. The left ventricular ejection fraction is hyperdynamic (>65%).     Cardiac cath 03/07/11:  1.  Inferior ST elevation MI, with a 100% mid RCA occlusion as the culprit lesion -reduced to 90% with wire passage. 2.  Successful percutaneous coronary intervention of the mid RCA with A Promus DES 2.5 mm x 28 mm (postdilated proximally to 4.1 2 mm, and distal to 3.75 mm). 3.  Preserved  Left Ventricular Ejection Fraction - 55% with mild inferior hypokinesis.   Past Medical History:  Diagnosis Date   Anxiety    Arthritis    knees & back    CAD S/P percutaneous coronary angioplasty 03/07/2011   a) PCI of mRCA with Promus Element DES 3.5 mm x 28 mm (4.2 -- 3.7 mm); b) Myoview 04/2011: EF 81% (small LV Cavity), fixed basal-mid Inferior Infarct, No Ischemia, 10 METS   Complication of anesthesia    Depression 03/08/2011   Dr. Milus Banister- [place], [facility]   Essential hypertension 03/07/2011   Family history of adverse reaction to anesthesia    GERD (gastroesophageal reflux disease)    Headache    chocolate & red wine triggers /w bad headaches    History of back surgery    Hyperlipidemia with target LDL less than 70 2012    Statin Intolerance (tried Lipitor & Crestor)   Insomnia    without significant sleeping disorders   Iron deficiency anemia 03/09/2011   Neuropathy    PONV (postoperative nausea and vomiting)    ST-segment elevation myocardial infarction (STEMI) of inferior wall ([place]) 03/07/2011   RCA Occlusion --> PCI of mRCA; ECHO 03/2011: EF > 55%, MIld Inferior HK, Mild AoV Sclerosis.   Vocal cord polyp 2005    Past Surgical History:  Procedure Laterality  Date   ABDOMINAL HYSTERECTOMY     APPENDECTOMY  07/04/2000   Normal appendix, adhesions noted.   BACK SURGERY     cyst on spinal cord - lumbar   BREAST BIOPSY Right 01/31/1999   fibrocystic changes, ductal adenosis, focal atypical ductal epithelium.   BREAST EXCISIONAL BIOPSY     CHOLECYSTECTOMY  03/25/1998   Dr. Bary Castilla, chronic cholecystitis.   COLONOSCOPY WITH PROPOFOL N/A 09/28/2014   Procedure: COLONOSCOPY WITH PROPOFOL;  Surgeon: Robert Bellow, MD;  Location: [facility] ENDOSCOPY;  Service: Endoscopy;  Laterality: N/A;   CORONARY ANGIOPLASTY WITH STENT PLACEMENT  03/07/2011   inferior STEMI - RCA PROMUS 3.5 mm x 28 mm DES  (post Dil 3.7 distal to 4.2 prox)   DENTAL SURGERY  2016    ESOPHAGOGASTRODUODENOSCOPY N/A 09/28/2014   Procedure: ESOPHAGOGASTRODUODENOSCOPY (EGD);  Surgeon: Robert Bellow, MD;  Location[facility] ENDOSCOPY;  Service: Endoscopy;  Laterality: N/A;   EYE SURGERY     blepheroplasty- bilateral   JOINT REPLACEMENT     KNEE SURGERY Left 2015   KNEE SURGERY Right 2010   Baker cyst   LEFT HEART CATH AND CORONARY ANGIOGRAPHY  03/08/2011   improved flow from the PCI   LEFT HEART CATHETERIZATION WITH CORONARY ANGIOGRAM N/A 03/07/2011   Procedure: LEFT HEART CATHETERIZATION WITH CORONARY ANGIOGRAM;  Surgeon: Leonie Man, MD;  Location: [facility] CATH LAB;  Service: Cardiovascular;  Laterality: N/A;   LEFT HEART CATHETERIZATION WITH CORONARY ANGIOGRAM N/A 03/08/2011   Procedure: LEFT HEART CATHETERIZATION WITH CORONARY ANGIOGRAM;  Surgeon: Leonie Man, MD;  Location: [facility] CATH LAB;  Service: Cardiovascular;  Laterality: N/A;   NM MYOCAR PERF WALL MOTION  08/2016   Lexiscan: LOW RISK.  No ischemia or infarction.  Hyperdynamic LV function .  EF> 65%   skin nodule Left 09/11/1999   dermatofibroma left lower leg, positive lateral margin.   TOTAL KNEE ARTHROPLASTY Bilateral 07/20/2015   Procedure: TOTAL KNEE BILATERAL;  Surgeon: Ninetta Lights, MD;  Location: [facility];  Service: Orthopedics;  Laterality: Bilateral;   TRANSTHORACIC ECHOCARDIOGRAM  03/27/2011   LV cavity is small,EF =>55%,MILD INFERIOR HYPOKINESIS; Mild Aortic Sclerosis   TRANSTHORACIC ECHOCARDIOGRAM  04/22/2020    EF 60-65%.  GRII DD.  No RWM A.  Normal RV.  Mild LA dilation.  (This is not consistent with GRII D), mild ascending aorta dilation of 38 mm.  Otherwise normal.   UPPER GI ENDOSCOPY  02/22/2003   Dr. Bary Castilla, normal    MEDICATIONS:  acetaminophen (TYLENOL) 500 MG tablet   aspirin EC 81 MG tablet   benazepril (LOTENSIN) 10 MG tablet   buPROPion (WELLBUTRIN XL) 300 MG 24 hr tablet   CYANOCOBALAMIN PO   Evolocumab (REPATHA SURECLICK) 403 MG/ML SOAJ   FLUoxetine (PROZAC) 10 MG capsule    FLUoxetine (PROZAC) 40 MG capsule   fluticasone (FLONASE) 50 MCG/ACT nasal spray   gabapentin (NEURONTIN) 300 MG capsule   metoprolol tartrate (LOPRESSOR) 50 MG tablet   mupirocin ointment (BACTROBAN) 2 %   nitroGLYCERIN (NITROSTAT) 0.4 MG SL tablet   omeprazole (PRILOSEC) 20 MG capsule   tiZANidine (ZANAFLEX) 4 MG tablet   No current facility-administered medications for this encounter.    Myra Gianotti, PA-C Surgical Short Stay/Anesthesiology [facility] Phone [phone] [facility] Phone [phone] 05/07/2022 1:11 PM

## 2022-05-07 NOTE — Progress Notes (Unsigned)
Cardiology Clinic Note   Patient Name: Jessica Landry Date of Encounter: 05/08/2022  Primary Care Provider:  Lauro Regulus, MD Primary Cardiologist:  Bryan Lemma, MD  Patient Profile    Jessica Landry 67 year old female presents to the clinic today for follow-up evaluation of her coronary artery disease, preoperative cardiac evaluation, and essential hypertension.  Past Medical History    Past Medical History:  Diagnosis Date   Anxiety    Arthritis    knees & back    CAD S/P percutaneous coronary angioplasty 03/07/2011   a) PCI of mRCA with Promus Element DES 3.5 mm x 28 mm (4.2 -- 3.7 mm); b) Myoview 04/2011: EF 81% (small LV Cavity), fixed basal-mid Inferior Infarct, No Ischemia, 10 METS   Complication of anesthesia    Depression 03/08/2011   Dr. Leory Plowman- [place] [place] [place]   Essential hypertension 03/07/2011   Family history of adverse reaction to anesthesia    GERD (gastroesophageal reflux disease)    Headache    chocolate & red wine triggers /w bad headaches    History of back surgery    Hyperlipidemia with target LDL less than 70 2012    Statin Intolerance (tried Lipitor & Crestor)   Insomnia    without significant sleeping disorders   Iron deficiency anemia 03/09/2011   Neuropathy    PONV (postoperative nausea and vomiting)    ST-segment elevation myocardial infarction (STEMI) of inferior wall (HCC) 03/07/2011   RCA Occlusion --> PCI of mRCA; ECHO 03/2011: EF > 55%, MIld Inferior HK, Mild AoV Sclerosis.   Vocal cord polyp 2005   Past Surgical History:  Procedure Laterality Date   ABDOMINAL HYSTERECTOMY     APPENDECTOMY  07/04/2000   Normal appendix, adhesions noted.   BACK SURGERY     cyst on spinal cord - lumbar   BREAST BIOPSY Right 01/31/1999   fibrocystic changes, ductal adenosis, focal atypical ductal epithelium.   BREAST EXCISIONAL BIOPSY     CHOLECYSTECTOMY  03/25/1998   Dr. Lemar Livings, chronic cholecystitis.   COLONOSCOPY WITH PROPOFOL N/A  09/28/2014   Procedure: COLONOSCOPY WITH PROPOFOL;  Surgeon: Earline Mayotte, MD;  Location: [facility] ENDOSCOPY;  Service: Endoscopy;  Laterality: N/A;   CORONARY ANGIOPLASTY WITH STENT PLACEMENT  03/07/2011   inferior STEMI - RCA PROMUS 3.5 mm x 28 mm DES  (post Dil 3.7 distal to 4.2 prox)   DENTAL SURGERY  2016   ESOPHAGOGASTRODUODENOSCOPY N/A 09/28/2014   Procedure: ESOPHAGOGASTRODUODENOSCOPY (EGD);  Surgeon: Earline Mayotte, MD;  Location: [facility] ENDOSCOPY;  Service: Endoscopy;  Laterality: N/A;   EYE SURGERY     blepheroplasty- bilateral   JOINT REPLACEMENT     KNEE SURGERY Left 2015   KNEE SURGERY Right 2010   Baker cyst   LEFT HEART CATH AND CORONARY ANGIOGRAPHY  03/08/2011   improved flow from the PCI   LEFT HEART CATHETERIZATION WITH CORONARY ANGIOGRAM N/A 03/07/2011   Procedure: LEFT HEART CATHETERIZATION WITH CORONARY ANGIOGRAM;  Surgeon: Marykay Lex, MD;  Location: [facility] CATH LAB;  Service: Cardiovascular;  Laterality: N/A;   LEFT HEART CATHETERIZATION WITH CORONARY ANGIOGRAM N/A 03/08/2011   Procedure: LEFT HEART CATHETERIZATION WITH CORONARY ANGIOGRAM;  Surgeon: Marykay Lex, MD;  Location: [facility] CATH LAB;  Service: Cardiovascular;  Laterality: N/A;   NM MYOCAR PERF WALL MOTION  08/2016   Lexiscan: LOW RISK.  No ischemia or infarction.  Hyperdynamic LV function .  EF> 65%   skin nodule Left 09/11/1999   dermatofibroma left lower  leg, positive lateral margin.   TOTAL KNEE ARTHROPLASTY Bilateral 07/20/2015   Procedure: TOTAL KNEE BILATERAL;  Surgeon: Loreta Aveaniel F Murphy, MD;  [facility];  Service: Orthopedics;  Laterality: Bilateral;   TRANSTHORACIC ECHOCARDIOGRAM  03/27/2011   LV cavity is small,EF =>55%,MILD INFERIOR HYPOKINESIS; Mild Aortic Sclerosis   TRANSTHORACIC ECHOCARDIOGRAM  04/22/2020    EF 60-65%.  GRII DD.  No RWM A.  Normal RV.  Mild LA dilation.  (This is not consistent with GRII D), mild ascending aorta dilation of 38 mm.  Otherwise normal.   UPPER GI  ENDOSCOPY  02/22/2003   Dr. Lemar LivingsByrnett, normal    Allergies  Allergies  Allergen Reactions   Bee Venom Anaphylaxis   Statins Other (See Comments)    Myalgias & fatigue   Etodolac    Prednisone Palpitations and Other (See Comments)    Can take small amounts. HR increase; increased energy    Sulfa Antibiotics Other (See Comments)    Elevated B/P, skin turns red    History of Present Illness    Jessica BrookingSarah A Landry has a PMH of palpitations, depression, obesity, hyperlipidemia, and coronary artery disease.  She underwent cardiac catheterization with PCI to her RCA 11/12.  She was seen in follow-up by Dr. Herbie BaltimoreHarding on 02/27/2021.  During that time she was stable from a cardiac standpoint.  She was somewhat anxious and nervous.  She had some foot discoloration.  She reported still having palpitations however they were better.  She was noted to have mild ankle swelling.  She denied chest pain and pressure at rest and with exertion.  She presents to the clinic today for follow-up evaluation and preoperative cardiac evaluation.  She states she feels well.  Her main complaint today is with her back and needing her upcoming back surgery.  We reviewed her previous cardiac catheterization and medications.  She and her husband expressed understanding.  She is limited in her physical activity due to her back pain.  She is able to complete greater than 4 METS of physical activity.  Her blood pressure is well-controlled at 126/70.  We reviewed recommendations for aspirin.  She would like to keep her follow-up appointment next month with Dr. Herbie BaltimoreHarding.  Today she denies chest pain, shortness of breath, lower extremity edema, fatigue, palpitations, melena, hematuria, hemoptysis, diaphoresis, weakness, presyncope, syncope, orthopnea, and PND.   Home Medications    Prior to Admission medications   Medication Sig Start Date End Date Taking? Authorizing Provider  acetaminophen (TYLENOL) 500 MG tablet Take 500-1,000  mg by mouth every 6 (six) hours as needed (pain.).    [provider]  aspirin EC 81 MG tablet Take 1 tablet (81 mg total) by mouth daily. 03/21/18   Marykay LexHarding, David W, MD  benazepril (LOTENSIN) 10 MG tablet Take 1 tablet (10 mg total) by mouth every morning. 07/14/21   Marykay LexHarding, David W, MD  buPROPion (WELLBUTRIN XL) 300 MG 24 hr tablet TAKE 1 TABLET EVERY MORNING 03/12/19   Jomarie LongsEappen, Saramma, MD  CYANOCOBALAMIN PO Take 1 tablet by mouth daily.    [provider]  Evolocumab (REPATHA SURECLICK) 140 MG/ML SOAJ ADMINISTER 140MG (1 ML) UNDER THE SKIN EVERY 2 WEEKS 05/01/22   Marykay LexHarding, David W, MD  FLUoxetine (PROZAC) 10 MG capsule TAKE 1 CAPSULE EVERY DAY  WITH  40MG  03/12/19   Jomarie LongsEappen, Saramma, MD  FLUoxetine (PROZAC) 40 MG capsule TAKE 1 CAPSULE EVERY DAY BEFORE BREAKFAST  WITH  10MG   CAP 03/12/19   Jomarie LongsEappen, Saramma, MD  fluticasone (FLONASE) 50 MCG/ACT nasal spray Place into the nose. 12/17/18 12/17/19  [provider]  gabapentin (NEURONTIN) 300 MG capsule Take 2 capsules (600 mg total) by mouth at bedtime. 09/05/18   Virginia Crews, MD  metoprolol tartrate (LOPRESSOR) 50 MG tablet TAKE 1 TABLET TWICE DAILY 05/09/21   Leonie Man, MD  mupirocin ointment (BACTROBAN) 2 % Apply topically 3 (three) times daily. 03/16/22   [provider]  nitroGLYCERIN (NITROSTAT) 0.4 MG SL tablet Place 1 tablet (0.4 mg total) under the tongue every 5 (five) minutes as needed for chest pain (up to 3 tabs in 15 mins and then call 911). 07/25/16   Leonie Man, MD  omeprazole (PRILOSEC) 20 MG capsule TAKE 1 CAPSULE(20 MG) BY MOUTH DAILY 03/10/18   Bacigalupo, Dionne Bucy, MD  tiZANidine (ZANAFLEX) 4 MG tablet Take 2 mg by mouth 3 (three) times daily as needed for muscle spasms. 03/14/22   [provider]    Family History    Family History  Problem Relation Age of Onset   Heart attack Mother    Coronary artery disease Mother    Stroke Mother    Depression Mother    Anxiety  disorder Mother    Alzheimer's disease Father    Stroke Father    Breast cancer Sister 15   Depression Brother    Drug abuse Brother    Healthy Sister    Bipolar disorder Sister    She indicated that her mother is deceased. She indicated that her father is deceased. She indicated that all of her three sisters are alive. She indicated that her brother is deceased.  Social History    Social History   Socioeconomic History   Marital status: Married    Spouse name: Richardson Landry   Number of children: 0   Years of education: 16   Highest education level: Bachelor's degree (e.g., BA, AB, BS)  Occupational History   Occupation: unemployed  Tobacco Use   Smoking status: Former    Packs/day: 0.25    Years: 40.00    Total pack years: 10.00    Types: Cigarettes    Quit date: 03/07/2011    Years since quitting: 11.1   Smokeless tobacco: Never  Vaping Use   Vaping Use: Never used  Substance and Sexual Activity   Alcohol use: No    Alcohol/week: 0.0 standard drinks of alcohol   Drug use: No   Sexual activity: Yes  Other Topics Concern   Not on file  Social History Narrative   Divorced woman -- Now Remarried to her long-term partner.   Exercises routinely on a daily basis roughly 45 minutes a day walking.  Quit smoking at the time of her MI. Does not drink.   Social Determinants of Health   Financial Resource Strain: Low Risk  (07/23/2018)   Overall Financial Resource Strain (CARDIA)    Difficulty of Paying Living Expenses: Not hard at all  Food Insecurity: No Food Insecurity (07/23/2018)   Hunger Vital Sign    Worried About Running Out of Food in the Last Year: Never true    Ran Out of Food in the Last Year: Never true  Transportation Needs: No Transportation Needs (07/23/2018)   PRAPARE - Hydrologist (Medical): No    Lack of Transportation (Non-Medical): No  Physical Activity: Inactive (07/23/2018)   Exercise Vital Sign    Days of Exercise per Week: 0  days    Minutes of  Exercise per Session: 0 min  Stress: No Stress Concern Present (07/23/2018)   [employer] of Occupational Health - Occupational Stress Questionnaire    Feeling of Stress : Not at all  Social Connections: Unknown (07/23/2018)   Social Connection and Isolation Panel [NHANES]    Frequency of Communication with Friends and Family: Not on file    Frequency of Social Gatherings with Friends and Family: Not on file    Attends Religious Services: Never    Database administrator or Organizations: No    Attends Banker Meetings: Never    Marital Status: Married  Catering manager Violence: Not At Risk (07/23/2018)   Humiliation, Afraid, Rape, and Kick questionnaire    Fear of Current or Ex-Partner: No    Emotionally Abused: No    Physically Abused: No    Sexually Abused: No     Review of Systems    General:  No chills, fever, night sweats or weight changes.  Cardiovascular:  No chest pain, dyspnea on exertion, edema, orthopnea, palpitations, paroxysmal nocturnal dyspnea. Dermatological: No rash, lesions/masses Respiratory: No cough, dyspnea Urologic: No hematuria, dysuria Abdominal:   No nausea, vomiting, diarrhea, bright red blood per rectum, melena, or hematemesis Neurologic:  No visual changes, wkns, changes in mental status. All other systems reviewed and are otherwise negative except as noted above.  Physical Exam    VS:  BP 126/70 (BP Location: Left Arm, Patient Position: Sitting, Cuff Size: Normal)   Pulse (!) 59   Ht 5\' 6"  (1.676 m)   Wt 164 lb 6.4 oz (74.6 kg)   BMI 26.53 kg/m  , BMI Body mass index is 26.53 kg/m. GEN: Well nourished, well developed, in no acute distress. HEENT: normal. Neck: Supple, no JVD, carotid bruits, or masses. Cardiac: RRR, no murmurs, rubs, or gallops. No clubbing, cyanosis, edema.  Radials/DP/PT 2+ and equal bilaterally.  Respiratory:  Respirations regular and unlabored, clear to auscultation bilaterally. GI:  Soft, nontender, nondistended, BS + x 4. MS: no deformity or atrophy. Skin: warm and dry, no rash.  Bruising left upper arm Neuro:  Strength and sensation are intact. Psych: Normal affect.  Accessory Clinical Findings    Recent Labs: 05/04/2022: BUN 10; Creatinine, Ser 1.13; Hemoglobin 11.7; Platelets 285; Potassium 3.8; Sodium 137   Recent Lipid Panel    Component Value Date/Time   CHOL 170 03/23/2019 1218   TRIG 63 03/23/2019 1218   HDL 88 03/23/2019 1218   CHOLHDL 1.9 03/23/2019 1218   CHOLHDL 2.2 08/14/2016 0454   VLDL 14 08/14/2016 0454   LDLCALC 70 03/23/2019 1218         ECG personally reviewed by me today-sinus bradycardia 59 bpm no ectopy.- No acute changes  Echocardiogram 04/22/2020  IMPRESSIONS     1. Left ventricular ejection fraction, by estimation, is 60 to 65%. The  left ventricle has normal function. The left ventricle has no regional  wall motion abnormalities. Left ventricular diastolic parameters are  consistent with Grade II diastolic  dysfunction (pseudonormalization). The average left ventricular global  longitudinal strain is -25.2 %. The global longitudinal strain is normal.   2. Right ventricular systolic function is normal. The right ventricular  size is normal. There is normal pulmonary artery systolic pressure.   3. Left atrial size was mildly dilated.   4. The mitral valve is normal in structure. Trivial mitral valve  regurgitation. No evidence of mitral stenosis.   5. The aortic valve is tricuspid. Aortic valve regurgitation is mild.  No  aortic stenosis is present.   6. Aortic dilatation noted. There is mild dilatation of the ascending  aorta, measuring 38 mm.   7. The inferior vena cava is normal in size with greater than 50%  respiratory variability, suggesting right atrial pressure of 3 mmHg.   Comparison(s): Report only 03/27/11 EF >55%.   FINDINGS   Left Ventricle: Left ventricular ejection fraction, by estimation, is 60  to 65%.  The left ventricle has normal function. The left ventricle has no  regional wall motion abnormalities. The average left ventricular global  longitudinal strain is -25.2 %.  The global longitudinal strain is normal. The left ventricular internal  cavity size was normal in size. There is no left ventricular hypertrophy.  Left ventricular diastolic parameters are consistent with Grade II  diastolic dysfunction  (pseudonormalization).   Right Ventricle: The right ventricular size is normal. No increase in  right ventricular wall thickness. Right ventricular systolic function is  normal. There is normal pulmonary artery systolic pressure. The tricuspid  regurgitant velocity is 2.38 m/s, and   with an assumed right atrial pressure of 3 mmHg, the estimated right  ventricular systolic pressure is 25.7 mmHg.   Left Atrium: Left atrial size was mildly dilated.   Right Atrium: Right atrial size was normal in size.   Pericardium: There is no evidence of pericardial effusion.   Mitral Valve: The mitral valve is normal in structure. Trivial mitral  valve regurgitation. No evidence of mitral valve stenosis.   Tricuspid Valve: The tricuspid valve is normal in structure. Tricuspid  valve regurgitation is mild . No evidence of tricuspid stenosis.   Aortic Valve: The aortic valve is tricuspid. Aortic valve regurgitation is  mild. Aortic regurgitation PHT measures 543 msec. No aortic stenosis is  present.   Pulmonic Valve: The pulmonic valve was normal in structure. Pulmonic valve  regurgitation is trivial. No evidence of pulmonic stenosis.   Aorta: Aortic dilatation noted. There is mild dilatation of the ascending  aorta, measuring 38 mm.   Venous: The inferior vena cava is normal in size with greater than 50%  respiratory variability, suggesting right atrial pressure of 3 mmHg.   IAS/Shunts: No atrial level shunt detected by color flow Doppler.    Assessment & Plan   1.  Coronary artery  disease-no chest pain today.  Denies episodes of exertional chest discomfort.  Underwent cardiac catheterization with PCI to her RCA in 2012. Continue aspirin, Repatha, metoprolol, nitroglycerin as needed Heart healthy low-sodium diet Increase physical activity as tolerated  Hyperlipidemia-LDL 74 06/20/21 Continue Repatha, aspirin Heart healthy low-sodium high-fiber diet Increase physical activity as tolerated  Palpitations-EKG today shows sinus bradycardia 59 bpm.  Continues to notice occasional episodes of irregular heartbeat.  Chronic and stable. Continue current medical therapy   Preoperative cardiac evaluation-LEFT SIDED L3-4 TRANSFORAMINAL LUMBAR  INTERBODY FUSION AND DECOMPRESSION WITH INSTRUMENTATION AND ALLOGRAFT, Dr. Yevette Edwards, 05/10/2022    Primary Cardiologist: Bryan Lemma, MD  Chart reviewed as part of pre-operative protocol coverage. Given past medical history and time since last visit, based on ACC/AHA guidelines, Jessica Landry would be at acceptable risk for the planned procedure without further cardiovascular testing.   Patient was advised that if she develops new symptoms prior to surgery to contact our office to arrange a follow-up appointment.  She verbalized understanding.  Her RCRI is a class 3 risk, 6.6% risk of major cardiac event.  She is able to complete greater than 4 METS of physical activity.  Her  aspirin may be held for 5 days prior to her surgery.  Please resume as soon as hemostasis is achieved.  I will route this recommendation to the requesting party via Epic fax function and remove from pre-op pool.  Please call with questions.   Disposition: Follow-up with Dr. Ellyn Hack as scheduled.    Jossie Ng. Jaleigh Mccroskey NP-C     05/08/2022, 9:19 AM [place] [address] Office [phone] Fax [phone]    I spent 14 minutes examining this patient, reviewing medications, and using patient centered shared  decision making involving her cardiac care.  Prior to her visit I spent greater than 20 minutes reviewing her past medical history,  medications, and prior cardiac tests.

## 2022-05-08 ENCOUNTER — Encounter: Payer: Self-pay | Admitting: General Practice

## 2022-05-08 ENCOUNTER — Ambulatory Visit: Payer: Medicare PPO | Attending: Physician Assistant | Admitting: General Practice

## 2022-05-08 VITALS — BP 126/70 | HR 59 | Ht 66.0 in | Wt 164.4 lb

## 2022-05-08 DIAGNOSIS — Z9861 Coronary angioplasty status: Secondary | ICD-10-CM

## 2022-05-08 DIAGNOSIS — R002 Palpitations: Secondary | ICD-10-CM

## 2022-05-08 DIAGNOSIS — I2510 Atherosclerotic heart disease of native coronary artery without angina pectoris: Secondary | ICD-10-CM | POA: Diagnosis not present

## 2022-05-08 DIAGNOSIS — Z01810 Encounter for preprocedural cardiovascular examination: Secondary | ICD-10-CM

## 2022-05-08 DIAGNOSIS — E782 Mixed hyperlipidemia: Secondary | ICD-10-CM

## 2022-05-08 MED ORDER — EZETIMIBE 10 MG PO TABS: 10.0000 mg | ORAL_TABLET | Freq: Every day | ORAL | 3 refills | Status: DC

## 2022-05-08 NOTE — Patient Instructions (Signed)
Medication Instructions:  The current medical regimen is effective;  continue present plan and medications as directed. Please refer to the Current Medication list given to you today.  *If you need a refill on your cardiac medications before your next appointment, please call your pharmacy*  Lab Work: NONE If you have labs (blood work) drawn today and your tests are completely normal, you will receive your results only by: [place] (if you have MyChart) OR A paper copy in the mail If you have any lab test that is abnormal or we need to change your treatment, we will call you to review the results.  Testing/Procedures: NONE  Other Instructions LOW RISK FOR UPCOMING SURGERY  Follow-Up: At [facility], you and your health needs are our priority.  As part of our continuing mission to provide you with exceptional heart care, we have created designated Provider Care Teams.  These Care Teams include your primary Cardiologist (physician) and Advanced Practice Providers (APPs -  Physician Assistants and Nurse Practitioners) who all work together to provide you with the care you need, when you need it.  Your next appointment:   KEEP SCHEDULED APPOINTMENT   Provider:   Glenetta Hew, MD

## 2022-05-08 NOTE — Addendum Note (Signed)
Addended by: Orma Render on: 05/08/2022 11:31 AM   Modules accepted: Orders

## 2022-05-08 NOTE — Anesthesia Preprocedure Evaluation (Signed)
Anesthesia Evaluation  Patient identified by MRN, date of birth, ID band Patient awake    Reviewed: Allergy & Precautions, NPO status , Patient's Chart, lab work & pertinent test results, reviewed documented beta blocker date and time   History of Anesthesia Complications (+) PONV and history of anesthetic complications  Airway Mallampati: II  TM Distance: >3 FB Neck ROM: Full    Dental  (+) Missing,    Pulmonary former smoker   Pulmonary exam normal        Cardiovascular hypertension, Pt. on medications and Pt. on home beta blockers + CAD, + Past MI and + Cardiac Stents (2012)  Normal cardiovascular exam     Neuro/Psych  Headaches  Anxiety Depression       GI/Hepatic Neg liver ROS,GERD  Medicated and Controlled,,  Endo/Other  negative endocrine ROS    Renal/GU negative Renal ROS     Musculoskeletal  (+) Arthritis ,    Abdominal   Peds  Hematology  (+) Blood dyscrasia (Hgb 11.7), anemia   Anesthesia Other Findings   Reproductive/Obstetrics                              Anesthesia Physical Anesthesia Plan  ASA: 3  Anesthesia Plan: General   Post-op Pain Management: Tylenol PO (pre-op)* and Ketamine IV*   Induction: Intravenous  PONV Risk Score and Plan: 4 or greater and Dexamethasone, Ondansetron, Midazolam, Propofol infusion and Treatment may vary due to age or medical condition  Airway Management Planned: Oral ETT  Additional Equipment: None  Intra-op Plan:   Post-operative Plan: Extubation in OR  Informed Consent: I have reviewed the patients History and Physical, chart, labs and discussed the procedure including the risks, benefits and alternatives for the proposed anesthesia with the patient or authorized representative who has indicated his/her understanding and acceptance.     Dental advisory given  Plan Discussed with: CRNA  Anesthesia Plan Comments: (PAT note  written 05/08/2022 by Myra Gianotti, PA-C.  )        Anesthesia Quick Evaluation

## 2022-05-10 ENCOUNTER — Ambulatory Visit (HOSPITAL_COMMUNITY): Payer: Medicare PPO

## 2022-05-10 ENCOUNTER — Ambulatory Visit (HOSPITAL_BASED_OUTPATIENT_CLINIC_OR_DEPARTMENT_OTHER): Payer: Medicare PPO | Admitting: Anesthesiology

## 2022-05-10 ENCOUNTER — Ambulatory Visit (HOSPITAL_COMMUNITY): Payer: Medicare PPO | Admitting: Vascular Surgery

## 2022-05-10 ENCOUNTER — Encounter (HOSPITAL_COMMUNITY): Payer: Self-pay | Admitting: Orthopedic Surgery

## 2022-05-10 ENCOUNTER — Other Ambulatory Visit: Payer: Self-pay

## 2022-05-10 ENCOUNTER — Ambulatory Visit (HOSPITAL_COMMUNITY)
Admission: RE | Disposition: A | Discharge status: Home Health | Payer: Self-pay | Source: Home / Self Care | Attending: Orthopedic Surgery

## 2022-05-10 ENCOUNTER — Observation Stay (HOSPITAL_COMMUNITY)
Admission: RE | Admit: 2022-05-10 | Discharge: 2022-05-11 | Disposition: A | Discharge status: Home Health | Payer: Medicare PPO | Attending: Orthopedic Surgery | Admitting: Orthopedic Surgery

## 2022-05-10 DIAGNOSIS — I2510 Atherosclerotic heart disease of native coronary artery without angina pectoris: Secondary | ICD-10-CM | POA: Diagnosis not present

## 2022-05-10 DIAGNOSIS — Z79899 Other long term (current) drug therapy: Secondary | ICD-10-CM | POA: Insufficient documentation

## 2022-05-10 DIAGNOSIS — Z87891 Personal history of nicotine dependence: Secondary | ICD-10-CM | POA: Diagnosis not present

## 2022-05-10 DIAGNOSIS — Z7952 Long term (current) use of systemic steroids: Secondary | ICD-10-CM | POA: Insufficient documentation

## 2022-05-10 DIAGNOSIS — M48061 Spinal stenosis, lumbar region without neurogenic claudication: Secondary | ICD-10-CM | POA: Insufficient documentation

## 2022-05-10 DIAGNOSIS — Z9861 Coronary angioplasty status: Secondary | ICD-10-CM | POA: Diagnosis not present

## 2022-05-10 DIAGNOSIS — Z96653 Presence of artificial knee joint, bilateral: Secondary | ICD-10-CM | POA: Diagnosis not present

## 2022-05-10 DIAGNOSIS — Z7982 Long term (current) use of aspirin: Secondary | ICD-10-CM | POA: Diagnosis not present

## 2022-05-10 DIAGNOSIS — M4316 Spondylolisthesis, lumbar region: Secondary | ICD-10-CM | POA: Diagnosis present

## 2022-05-10 DIAGNOSIS — M5416 Radiculopathy, lumbar region: Secondary | ICD-10-CM | POA: Diagnosis present

## 2022-05-10 DIAGNOSIS — F418 Other specified anxiety disorders: Secondary | ICD-10-CM

## 2022-05-10 DIAGNOSIS — I10 Essential (primary) hypertension: Secondary | ICD-10-CM | POA: Diagnosis not present

## 2022-05-10 DIAGNOSIS — M48062 Spinal stenosis, lumbar region with neurogenic claudication: Secondary | ICD-10-CM

## 2022-05-10 HISTORY — PX: TRANSFORAMINAL LUMBAR INTERBODY FUSION (TLIF) WITH PEDICLE SCREW FIXATION 1 LEVEL: SHX6141

## 2022-05-10 SURGERY — TRANSFORAMINAL LUMBAR INTERBODY FUSION (TLIF) WITH PEDICLE SCREW FIXATION 1 LEVEL
Anesthesia: General | Laterality: Left

## 2022-05-10 MED ORDER — FLUOXETINE HCL 10 MG PO CAPS
50.0000 mg | ORAL_CAPSULE | Freq: Every day | ORAL | Status: DC
  Administered 2022-05-11: 50 mg via ORAL
  Filled 2022-05-10 (×2): qty 5

## 2022-05-10 MED ORDER — ONDANSETRON HCL 4 MG/2ML IJ SOLN: 4.0000 mg | Freq: Four times a day (QID) | INTRAMUSCULAR | Status: DC | PRN

## 2022-05-10 MED ORDER — NITROGLYCERIN 0.4 MG SL SUBL: 0.4000 mg | SUBLINGUAL_TABLET | SUBLINGUAL | Status: DC | PRN

## 2022-05-10 MED ORDER — ACETAMINOPHEN 500 MG PO TABS
1000.0000 mg | ORAL_TABLET | Freq: Once | ORAL | Status: AC
  Administered 2022-05-10: 1000 mg via ORAL
  Filled 2022-05-10: qty 2

## 2022-05-10 MED ORDER — PANTOPRAZOLE SODIUM 40 MG PO TBEC
40.0000 mg | DELAYED_RELEASE_TABLET | Freq: Every day | ORAL | Status: DC
  Administered 2022-05-11: 40 mg via ORAL
  Filled 2022-05-10: qty 1

## 2022-05-10 MED ORDER — FENTANYL CITRATE (PF) 250 MCG/5ML IJ SOLN
INTRAMUSCULAR | Status: AC
  Filled 2022-05-10: qty 5

## 2022-05-10 MED ORDER — BUPIVACAINE-EPINEPHRINE 0.5% -1:200000 IJ SOLN
INTRAMUSCULAR | Status: DC | PRN
  Administered 2022-05-10: 27 mL

## 2022-05-10 MED ORDER — DROPERIDOL 2.5 MG/ML IJ SOLN: 0.6250 mg | Freq: Once | INTRAMUSCULAR | Status: DC | PRN

## 2022-05-10 MED ORDER — SODIUM CHLORIDE 0.9 % IV SOLN
250.0000 mL | INTRAVENOUS | Status: DC
  Administered 2022-05-10: 250 mL via INTRAVENOUS

## 2022-05-10 MED ORDER — MIDAZOLAM HCL 2 MG/2ML IJ SOLN
INTRAMUSCULAR | Status: DC | PRN
  Administered 2022-05-10: 2 mg via INTRAVENOUS

## 2022-05-10 MED ORDER — FENTANYL CITRATE (PF) 250 MCG/5ML IJ SOLN
INTRAMUSCULAR | Status: DC | PRN
  Administered 2022-05-10: 25 ug via INTRAVENOUS
  Administered 2022-05-10: 100 ug via INTRAVENOUS
  Administered 2022-05-10: 50 ug via INTRAVENOUS
  Administered 2022-05-10: 25 ug via INTRAVENOUS

## 2022-05-10 MED ORDER — DEXAMETHASONE SODIUM PHOSPHATE 10 MG/ML IJ SOLN
INTRAMUSCULAR | Status: DC | PRN
  Administered 2022-05-10: 5 mg via INTRAVENOUS

## 2022-05-10 MED ORDER — PROPOFOL 10 MG/ML IV BOLUS
INTRAVENOUS | Status: DC | PRN
  Administered 2022-05-10: 140 mg via INTRAVENOUS

## 2022-05-10 MED ORDER — METHOCARBAMOL 500 MG PO TABS
500.0000 mg | ORAL_TABLET | Freq: Four times a day (QID) | ORAL | Status: DC | PRN
  Administered 2022-05-10 (×2): 500 mg via ORAL
  Administered 2022-05-11: 1000 mg via ORAL
  Filled 2022-05-10 (×2): qty 1
  Filled 2022-05-10: qty 2

## 2022-05-10 MED ORDER — SODIUM CHLORIDE 0.9% FLUSH: 3.0000 mL | INTRAVENOUS | Status: DC | PRN

## 2022-05-10 MED ORDER — MORPHINE SULFATE (PF) 2 MG/ML IV SOLN
1.0000 mg | INTRAVENOUS | Status: DC | PRN
  Administered 2022-05-10: 2 mg via INTRAVENOUS
  Filled 2022-05-10: qty 1

## 2022-05-10 MED ORDER — VITAMIN B-12 100 MCG PO TABS
100.0000 ug | ORAL_TABLET | Freq: Every day | ORAL | Status: DC
  Administered 2022-05-10: 100 ug via ORAL
  Filled 2022-05-10 (×2): qty 1

## 2022-05-10 MED ORDER — HYDROMORPHONE HCL 1 MG/ML IJ SOLN
INTRAMUSCULAR | Status: AC
  Filled 2022-05-10: qty 1

## 2022-05-10 MED ORDER — THROMBIN 20000 UNITS EX KIT
PACK | CUTANEOUS | Status: DC | PRN
  Administered 2022-05-10: 20 mL via TOPICAL

## 2022-05-10 MED ORDER — PHENYLEPHRINE HCL-NACL 20-0.9 MG/250ML-% IV SOLN
INTRAVENOUS | Status: DC | PRN
  Administered 2022-05-10: 40 ug/min via INTRAVENOUS

## 2022-05-10 MED ORDER — PHENOL 1.4 % MT LIQD: 1.0000 | OROMUCOSAL | Status: DC | PRN

## 2022-05-10 MED ORDER — HYDROMORPHONE HCL 1 MG/ML IJ SOLN
0.2500 mg | INTRAMUSCULAR | Status: DC | PRN
  Administered 2022-05-10 (×4): 0.5 mg via INTRAVENOUS

## 2022-05-10 MED ORDER — CEFAZOLIN SODIUM-DEXTROSE 2-4 GM/100ML-% IV SOLN
2.0000 g | Freq: Three times a day (TID) | INTRAVENOUS | Status: AC
  Administered 2022-05-10 (×2): 2 g via INTRAVENOUS
  Filled 2022-05-10 (×2): qty 100

## 2022-05-10 MED ORDER — CEFAZOLIN SODIUM-DEXTROSE 2-4 GM/100ML-% IV SOLN
2.0000 g | INTRAVENOUS | Status: AC
  Administered 2022-05-10: 2 g via INTRAVENOUS
  Filled 2022-05-10: qty 100

## 2022-05-10 MED ORDER — LIDOCAINE 2% (20 MG/ML) 5 ML SYRINGE
INTRAMUSCULAR | Status: AC
  Filled 2022-05-10: qty 5

## 2022-05-10 MED ORDER — BENAZEPRIL HCL 5 MG PO TABS
10.0000 mg | ORAL_TABLET | Freq: Every morning | ORAL | Status: DC
  Administered 2022-05-10: 10 mg via ORAL
  Filled 2022-05-10 (×2): qty 2

## 2022-05-10 MED ORDER — PHENYLEPHRINE 80 MCG/ML (10ML) SYRINGE FOR IV PUSH (FOR BLOOD PRESSURE SUPPORT)
PREFILLED_SYRINGE | INTRAVENOUS | Status: DC | PRN
  Administered 2022-05-10: 160 ug via INTRAVENOUS

## 2022-05-10 MED ORDER — LIDOCAINE 2% (20 MG/ML) 5 ML SYRINGE
INTRAMUSCULAR | Status: DC | PRN
  Administered 2022-05-10: 100 mg via INTRAVENOUS

## 2022-05-10 MED ORDER — MENTHOL 3 MG MT LOZG: 1.0000 | LOZENGE | OROMUCOSAL | Status: DC | PRN

## 2022-05-10 MED ORDER — ALUM & MAG HYDROXIDE-SIMETH 200-200-20 MG/5ML PO SUSP: 30.0000 mL | Freq: Four times a day (QID) | ORAL | Status: DC | PRN

## 2022-05-10 MED ORDER — SODIUM CHLORIDE 0.9% FLUSH: 3.0000 mL | Freq: Two times a day (BID) | INTRAVENOUS | Status: DC

## 2022-05-10 MED ORDER — ONDANSETRON HCL 4 MG/2ML IJ SOLN
INTRAMUSCULAR | Status: AC
  Filled 2022-05-10: qty 2

## 2022-05-10 MED ORDER — METHOCARBAMOL 1000 MG/10ML IJ SOLN
500.0000 mg | Freq: Four times a day (QID) | INTRAVENOUS | Status: DC | PRN
  Filled 2022-05-10: qty 5

## 2022-05-10 MED ORDER — ACETAMINOPHEN 500 MG PO TABS: 500.0000 mg | ORAL_TABLET | Freq: Four times a day (QID) | ORAL | Status: DC | PRN

## 2022-05-10 MED ORDER — PHENYLEPHRINE 80 MCG/ML (10ML) SYRINGE FOR IV PUSH (FOR BLOOD PRESSURE SUPPORT)
PREFILLED_SYRINGE | INTRAVENOUS | Status: AC
  Filled 2022-05-10: qty 10

## 2022-05-10 MED ORDER — ROCURONIUM BROMIDE 10 MG/ML (PF) SYRINGE
PREFILLED_SYRINGE | INTRAVENOUS | Status: AC
  Filled 2022-05-10: qty 10

## 2022-05-10 MED ORDER — OXYCODONE-ACETAMINOPHEN 5-325 MG PO TABS
1.0000 | ORAL_TABLET | ORAL | Status: DC | PRN
  Administered 2022-05-11: 2 via ORAL
  Administered 2022-05-11: 1 via ORAL
  Filled 2022-05-10: qty 2
  Filled 2022-05-10: qty 1

## 2022-05-10 MED ORDER — ACETAMINOPHEN 650 MG RE SUPP: 650.0000 mg | RECTAL | Status: DC | PRN

## 2022-05-10 MED ORDER — ACETAMINOPHEN 325 MG PO TABS: 650.0000 mg | ORAL_TABLET | ORAL | Status: DC | PRN

## 2022-05-10 MED ORDER — POTASSIUM CHLORIDE IN NACL 20-0.9 MEQ/L-% IV SOLN: INTRAVENOUS | Status: DC

## 2022-05-10 MED ORDER — FLUOXETINE HCL 10 MG PO CAPS: 10.0000 mg | ORAL_CAPSULE | Freq: Every day | ORAL | Status: DC

## 2022-05-10 MED ORDER — SENNOSIDES-DOCUSATE SODIUM 8.6-50 MG PO TABS: 1.0000 | ORAL_TABLET | Freq: Every evening | ORAL | Status: DC | PRN

## 2022-05-10 MED ORDER — POVIDONE-IODINE 7.5 % EX SOLN
Freq: Once | CUTANEOUS | Status: DC
  Filled 2022-05-10: qty 118

## 2022-05-10 MED ORDER — BUPIVACAINE LIPOSOME 1.3 % IJ SUSP
INTRAMUSCULAR | Status: AC
  Filled 2022-05-10: qty 20

## 2022-05-10 MED ORDER — ZOLPIDEM TARTRATE 5 MG PO TABS: 5.0000 mg | ORAL_TABLET | Freq: Every evening | ORAL | Status: DC | PRN

## 2022-05-10 MED ORDER — BUPROPION HCL ER (XL) 300 MG PO TB24
300.0000 mg | ORAL_TABLET | Freq: Every morning | ORAL | Status: DC
  Administered 2022-05-11: 300 mg via ORAL
  Filled 2022-05-10: qty 1

## 2022-05-10 MED ORDER — SUGAMMADEX SODIUM 200 MG/2ML IV SOLN
INTRAVENOUS | Status: DC | PRN
  Administered 2022-05-10: 200 mg via INTRAVENOUS

## 2022-05-10 MED ORDER — MIDAZOLAM HCL 2 MG/2ML IJ SOLN
INTRAMUSCULAR | Status: AC
  Filled 2022-05-10: qty 2

## 2022-05-10 MED ORDER — DEXAMETHASONE SODIUM PHOSPHATE 10 MG/ML IJ SOLN
INTRAMUSCULAR | Status: AC
  Filled 2022-05-10: qty 1

## 2022-05-10 MED ORDER — ONDANSETRON HCL 4 MG/2ML IJ SOLN
INTRAMUSCULAR | Status: DC | PRN
  Administered 2022-05-10: 4 mg via INTRAVENOUS

## 2022-05-10 MED ORDER — ALBUMIN HUMAN 25 % IV SOLN: INTRAVENOUS | Status: DC | PRN

## 2022-05-10 MED ORDER — THROMBIN 20000 UNITS EX SOLR
CUTANEOUS | Status: AC
  Filled 2022-05-10: qty 20000

## 2022-05-10 MED ORDER — 0.9 % SODIUM CHLORIDE (POUR BTL) OPTIME
TOPICAL | Status: DC | PRN
  Administered 2022-05-10: 1000 mL

## 2022-05-10 MED ORDER — LACTATED RINGERS IV SOLN: INTRAVENOUS | Status: DC

## 2022-05-10 MED ORDER — BUPIVACAINE-EPINEPHRINE (PF) 0.5% -1:200000 IJ SOLN
INTRAMUSCULAR | Status: AC
  Filled 2022-05-10: qty 30

## 2022-05-10 MED ORDER — CHLORHEXIDINE GLUCONATE 0.12 % MT SOLN
15.0000 mL | Freq: Once | OROMUCOSAL | Status: AC
  Administered 2022-05-10: 15 mL via OROMUCOSAL
  Filled 2022-05-10: qty 15

## 2022-05-10 MED ORDER — BISACODYL 5 MG PO TBEC: 5.0000 mg | DELAYED_RELEASE_TABLET | Freq: Every day | ORAL | Status: DC | PRN

## 2022-05-10 MED ORDER — GABAPENTIN 300 MG PO CAPS
600.0000 mg | ORAL_CAPSULE | Freq: Every day | ORAL | Status: DC
  Administered 2022-05-10: 600 mg via ORAL
  Filled 2022-05-10: qty 2

## 2022-05-10 MED ORDER — PROPOFOL 10 MG/ML IV BOLUS
INTRAVENOUS | Status: AC
  Filled 2022-05-10: qty 20

## 2022-05-10 MED ORDER — DOCUSATE SODIUM 100 MG PO CAPS
100.0000 mg | ORAL_CAPSULE | Freq: Two times a day (BID) | ORAL | Status: DC
  Administered 2022-05-10 – 2022-05-11 (×3): 100 mg via ORAL
  Filled 2022-05-10 (×3): qty 1

## 2022-05-10 MED ORDER — HYDROCODONE-ACETAMINOPHEN 5-325 MG PO TABS
1.0000 | ORAL_TABLET | ORAL | Status: DC | PRN
  Administered 2022-05-10: 2 via ORAL
  Filled 2022-05-10: qty 2

## 2022-05-10 MED ORDER — ORAL CARE MOUTH RINSE: 15.0000 mL | Freq: Once | OROMUCOSAL | Status: AC

## 2022-05-10 MED ORDER — METOPROLOL TARTRATE 50 MG PO TABS
50.0000 mg | ORAL_TABLET | Freq: Two times a day (BID) | ORAL | Status: DC
  Filled 2022-05-10 (×3): qty 1

## 2022-05-10 MED ORDER — BUPIVACAINE LIPOSOME 1.3 % IJ SUSP
INTRAMUSCULAR | Status: DC | PRN
  Administered 2022-05-10: 20 mL

## 2022-05-10 MED ORDER — OXYCODONE-ACETAMINOPHEN 5-325 MG PO TABS
ORAL_TABLET | ORAL | Status: AC
  Filled 2022-05-10: qty 1

## 2022-05-10 MED ORDER — ONDANSETRON HCL 4 MG PO TABS: 4.0000 mg | ORAL_TABLET | Freq: Four times a day (QID) | ORAL | Status: DC | PRN

## 2022-05-10 MED ORDER — ROCURONIUM BROMIDE 10 MG/ML (PF) SYRINGE
PREFILLED_SYRINGE | INTRAVENOUS | Status: DC | PRN
  Administered 2022-05-10: 80 mg via INTRAVENOUS
  Administered 2022-05-10: 20 mg via INTRAVENOUS

## 2022-05-10 MED ORDER — FLEET ENEMA 7-19 GM/118ML RE ENEM: 1.0000 | ENEMA | Freq: Once | RECTAL | Status: DC | PRN

## 2022-05-10 SURGICAL SUPPLY — 84 items
BAG COUNTER SPONGE SURGICOUNT (BAG) ×1 IMPLANT
BENZOIN TINCTURE PRP APPL 2/3 (GAUZE/BANDAGES/DRESSINGS) ×1 IMPLANT
BLADE CLIPPER SURG (BLADE) IMPLANT
BUR PRESCISION 1.7 ELITE (BURR) ×1 IMPLANT
BUR ROUND FLUTED 5 RND (BURR) ×1 IMPLANT
BUR ROUND PRECISION 4.0 (BURR) IMPLANT
BUR SABER RD CUTTING 3.0 (BURR) IMPLANT
CAGE SABLE 10X30 6-12 8D (Cage) IMPLANT
CANNULA GRAFT BNE VG PRE-FILL (Bone Implant) IMPLANT
CNTNR URN SCR LID CUP LEK RST (MISCELLANEOUS) ×1 IMPLANT
CONT SPEC 4OZ STRL OR WHT (MISCELLANEOUS) ×1
COVER BACK TABLE 60X90IN (DRAPES) ×1 IMPLANT
COVER MAYO STAND STRL (DRAPES) ×2 IMPLANT
COVER SURGICAL LIGHT HANDLE (MISCELLANEOUS) ×1 IMPLANT
DISPENSER GRAFT BNE VG (MISCELLANEOUS) IMPLANT
DISPENSER VIVIGEN BONE GRAFT (MISCELLANEOUS) ×1 IMPLANT
DRAIN CHANNEL 15F RND FF W/TCR (WOUND CARE) IMPLANT
DRAPE C-ARM 42X72 X-RAY (DRAPES) ×1 IMPLANT
DRAPE C-ARMOR (DRAPES) IMPLANT
DRAPE POUCH INSTRU U-SHP 10X18 (DRAPES) ×1 IMPLANT
DRAPE SURG 17X23 STRL (DRAPES) ×4 IMPLANT
DURAPREP 26ML APPLICATOR (WOUND CARE) ×1 IMPLANT
ELECT BLADE 4.0 EZ CLEAN MEGAD (MISCELLANEOUS) ×1
ELECT CAUTERY BLADE 6.4 (BLADE) ×1 IMPLANT
ELECT REM PT RETURN 9FT ADLT (ELECTROSURGICAL) ×1
ELECTRODE BLDE 4.0 EZ CLN MEGD (MISCELLANEOUS) ×1 IMPLANT
ELECTRODE REM PT RTRN 9FT ADLT (ELECTROSURGICAL) ×1 IMPLANT
EVACUATOR SILICONE 100CC (DRAIN) IMPLANT
FILTER STRAW FLUID ASPIR (MISCELLANEOUS) ×1 IMPLANT
GAUZE 4X4 16PLY ~~LOC~~+RFID DBL (SPONGE) ×1 IMPLANT
GAUZE SPONGE 4X4 12PLY STRL (GAUZE/BANDAGES/DRESSINGS) ×1 IMPLANT
GLOVE BIO SURGEON STRL SZ7 (GLOVE) ×1 IMPLANT
GLOVE BIO SURGEON STRL SZ8 (GLOVE) ×1 IMPLANT
GLOVE BIOGEL PI IND STRL 7.0 (GLOVE) ×1 IMPLANT
GLOVE BIOGEL PI IND STRL 8 (GLOVE) ×1 IMPLANT
GLOVE SURG ENC MOIS LTX SZ6.5 (GLOVE) ×1 IMPLANT
GOWN STRL REUS W/ TWL LRG LVL3 (GOWN DISPOSABLE) ×2 IMPLANT
GOWN STRL REUS W/ TWL XL LVL3 (GOWN DISPOSABLE) ×1 IMPLANT
GOWN STRL REUS W/TWL LRG LVL3 (GOWN DISPOSABLE) ×2
GOWN STRL REUS W/TWL XL LVL3 (GOWN DISPOSABLE) ×1
GRAFT BONE CANNULA VIVIGEN 3 (Bone Implant) ×3 IMPLANT
IV CATH 14GX2 1/4 (CATHETERS) ×1 IMPLANT
KIT BASIN OR (CUSTOM PROCEDURE TRAY) ×1 IMPLANT
KIT POSITION SURG JACKSON T1 (MISCELLANEOUS) ×1 IMPLANT
KIT TURNOVER KIT B (KITS) ×1 IMPLANT
MARKER SKIN DUAL TIP RULER LAB (MISCELLANEOUS) ×2 IMPLANT
NDL 18GX1X1/2 (RX/OR ONLY) (NEEDLE) ×1 IMPLANT
NDL 22X1.5 STRL (OR ONLY) (MISCELLANEOUS) ×2 IMPLANT
NDL HYPO 25GX1X1/2 BEV (NEEDLE) ×1 IMPLANT
NDL SPNL 18GX3.5 QUINCKE PK (NEEDLE) ×2 IMPLANT
NEEDLE 18GX1X1/2 (RX/OR ONLY) (NEEDLE) ×1 IMPLANT
NEEDLE 22X1.5 STRL (OR ONLY) (MISCELLANEOUS) ×2 IMPLANT
NEEDLE HYPO 25GX1X1/2 BEV (NEEDLE) ×1 IMPLANT
NEEDLE SPNL 18GX3.5 QUINCKE PK (NEEDLE) ×2 IMPLANT
NS IRRIG 1000ML POUR BTL (IV SOLUTION) ×1 IMPLANT
PACK LAMINECTOMY ORTHO (CUSTOM PROCEDURE TRAY) ×1 IMPLANT
PACK UNIVERSAL I (CUSTOM PROCEDURE TRAY) ×1 IMPLANT
PAD ARMBOARD 7.5X6 YLW CONV (MISCELLANEOUS) ×2 IMPLANT
PATTIES SURGICAL .5 X1 (DISPOSABLE) ×1 IMPLANT
PATTIES SURGICAL .5X1.5 (GAUZE/BANDAGES/DRESSINGS) ×1 IMPLANT
ROD PRE BENT EXP 40MM (Rod) IMPLANT
SCREW SET SINGLE INNER (Screw) IMPLANT
SCREW VIPER CORT FIX 6.00X30 (Screw) IMPLANT
SPONGE INTESTINAL PEANUT (DISPOSABLE) ×1 IMPLANT
SPONGE SURGIFOAM ABS GEL 100 (HEMOSTASIS) ×1 IMPLANT
STRIP CLOSURE SKIN 1/2X4 (GAUZE/BANDAGES/DRESSINGS) ×2 IMPLANT
SURGIFLO W/THROMBIN 8M KIT (HEMOSTASIS) IMPLANT
SUT MNCRL AB 4-0 PS2 18 (SUTURE) ×1 IMPLANT
SUT VIC AB 0 CT1 18XCR BRD 8 (SUTURE) ×1 IMPLANT
SUT VIC AB 0 CT1 8-18 (SUTURE) ×1
SUT VIC AB 1 CT1 18XCR BRD 8 (SUTURE) ×1 IMPLANT
SUT VIC AB 1 CT1 8-18 (SUTURE) ×1
SUT VIC AB 2-0 CT2 18 VCP726D (SUTURE) ×1 IMPLANT
SYR 20ML LL LF (SYRINGE) ×2 IMPLANT
SYR BULB IRRIG 60ML STRL (SYRINGE) ×1 IMPLANT
SYR CONTROL 10ML LL (SYRINGE) ×2 IMPLANT
SYR TB 1ML LUER SLIP (SYRINGE) ×1 IMPLANT
TAP EXPEDIUM DL 4.35 (INSTRUMENTS) IMPLANT
TAP EXPEDIUM DL 5.0 (INSTRUMENTS) IMPLANT
TAP EXPEDIUM DL 6.0 (INSTRUMENTS) IMPLANT
TRAY FOLEY MTR SLVR 16FR STAT (SET/KITS/TRAYS/PACK) ×1 IMPLANT
TUBE FUNNEL GL DISP (ORTHOPEDIC DISPOSABLE SUPPLIES) IMPLANT
WATER STERILE IRR 1000ML POUR (IV SOLUTION) ×1 IMPLANT
YANKAUER SUCT BULB TIP NO VENT (SUCTIONS) ×1 IMPLANT

## 2022-05-10 NOTE — Transfer of Care (Signed)
Immediate Anesthesia Transfer of Care Note  Patient: Jessica Landry  Procedure(s) Performed: LEFT-SIDED LUMBAR THREE - LUMBAR FOUR TRANSFORAMINAL LUMBAR INTERBODY FUSION AND DECOMPRESSION WITH INSTRUMENTATION AND ALLOGRAFT (Left)  Patient Location: PACU  Anesthesia Type:General  Level of Consciousness: drowsy  Airway & Oxygen Therapy: Patient Spontanous Breathing and Patient connected to face mask oxygen  Post-op Assessment: Report given to RN and Post -op Vital signs reviewed and stable  Post vital signs: Reviewed and stable  Last Vitals:  Vitals Value Taken Time  BP 157/78 05/10/22 1037  Temp    Pulse 69 05/10/22 1039  Resp 12 05/10/22 1039  SpO2 100 % 05/10/22 1039  Vitals shown include unvalidated device data.  Last Pain:  Vitals:   05/10/22 0656  TempSrc:   PainSc: 0-No pain         Complications: No notable events documented.

## 2022-05-10 NOTE — Op Note (Signed)
PATIENT NAME: Jessica Landry   MEDICAL RECORD NO.:   [id number]    DATE OF BIRTH: 1955-08-31   DATE OF PROCEDURE: 05/10/2022                               OPERATIVE REPORT     PREOPERATIVE DIAGNOSES: 1. Bilateral lumbar radiculopathy 2. L3-4 stenosis 3. L3-4 spondylolisthesis   POSTOPERATIVE DIAGNOSES: 1. Bilateral lumbar radiculopathy 2. L3-4 stenosis 3. L3-4 spondylolisthesis   PROCEDURES: 1. L3-4 decompression. 2. Left-sided L3-4 transforaminal lumbar interbody fusion. 3. Right-sided L3-4 posterolateral fusion. 4. Insertion of interbody device x1 (Globus expandable intervertebral spacer). 5. Placement of posterior instrumentation at L3, L4 bilaterally. 6. Use of local autograft. 7. Use of morselized allograft - ViviGen. 8. Intraoperative use of fluoroscopy.   SURGEON:  Phylliss Bob, MD.   ASSISTANTPricilla Holm, PA-C.   ANESTHESIA:  General endotracheal anesthesia.   COMPLICATIONS:  None.   DISPOSITION:  Stable.   ESTIMATED BLOOD LOSS:   Minimal   INDICATIONS FOR SURGERY:  Briefly, [name] is a pleasant 67 y.o. -year-old female who did present to me with severe and ongoing pain in the bilateral legs. I did feel that the symptoms were secondary to the findings noted above.   She did undergo extensive conservative treatment, but did continue to have ongoing symptoms. Given her ongoing symptoms, and failure of conservative care, she did wish to proceed with the procedure  noted above.   OPERATIVE DETAILS:  On 05/10/2022, the patient was brought to surgery and general endotracheal anesthesia was administered.  The patient was placed prone on a well-padded flat Jackson bed with a spinal frame.  Antibiotics were given and a time-out procedure was performed. The back was prepped and draped in the usual fashion.  A midline incision was made overlying the L3-4 intervertebral space.  The fascia was incised at the midline.  The paraspinal musculature was  bluntly swept laterally.  Anatomic landmarks for the pedicles were exposed. Using fluoroscopy, I did cannulate the L3 and L4 pedicles bilaterally, using a medial to lateral cortical trajectory technique.  On the right side, the posterolateral gutter and right facet joint at L3-4 was decorticated and 6 x 35 mm screws were placed and a 40-mm rod was placed and distraction was applied across the rod on the right side.  On the left side, the cannulated pedicle holes were filled with bone wax.  I then proceeded with the decompressive aspect of the procedure.  On the left side, I did perform a thorough and complete neuroforaminal decompression, with near-complete removal of the left L3-4 facet joint. A partial facetectomy was performed on the right.  The L3-4 level was entirely decompressed. At this point, with an assistant holding medial retraction of the traversing left L4 nerve, I did perform an annulotomy at the posterolateral aspect of the L3-4 intervertebral space.  I then used a series of curettes and pituitary rongeurs to perform a thorough and complete intervertebral diskectomy.  The intervertebral space was then liberally packed with autograft as well as allograft in the form of ViviGen, as was the appropriate-sized intervertebral spacer.  The spacer was then tamped into position in the usual fashion, and expanded to approximately 10.5 mm in height. I was very pleased with the press-fit of the spacer.  I then placed 6 x 30 mm screws on the left at L4 and L5.  A 40-mm rod was then placed and  caps were placed. The distraction was then released on the contralateral right side.  All 4 caps were then locked.  The wound was copiously irrigated with a total of approximately 3 L prior to placing the bone graft.  Additional autograft and allograft were then packed into the posterolateral gutter on the right side to help aid in the L3-4 fusion.  The wound was explored for any undue bleeding  and there was no substantial bleeding encountered.  Gel-Foam was placed over the laminectomy site.  The wound was then closed in layers using #1 Vicryl followed by 2-0 Vicryl, followed by 4-0 Monocryl.  Benzoin and Steri-Strips were applied followed by sterile dressing.    Of note, Pricilla Holm was my assistant throughout surgery, and did aid in retraction, the decompression, suctioning, and closure.     Phylliss Bob, MD

## 2022-05-10 NOTE — Anesthesia Procedure Notes (Signed)
Procedure Name: Intubation Date/Time: 05/10/2022 8:04 AM  Performed by: Lorie Phenix, CRNAPre-anesthesia Checklist: Patient identified, Emergency Drugs available, Suction available and Patient being monitored Patient Re-evaluated:Patient Re-evaluated prior to induction Oxygen Delivery Method: Circle system utilized Preoxygenation: Pre-oxygenation with 100% oxygen Induction Type: IV induction Ventilation: Mask ventilation without difficulty Laryngoscope Size: Mac and 3 Grade View: Grade I Tube type: Oral Tube size: 7.0 mm Number of attempts: 1 Airway Equipment and Method: Stylet Placement Confirmation: ETT inserted through vocal cords under direct vision, positive ETCO2 and breath sounds checked- equal and bilateral Secured at: 21 cm Tube secured with: Tape Dental Injury: Teeth and Oropharynx as per pre-operative assessment

## 2022-05-10 NOTE — Anesthesia Postprocedure Evaluation (Signed)
Anesthesia Post Note  Patient: Jessica Landry  Procedure(s) Performed: LEFT-SIDED LUMBAR THREE - LUMBAR FOUR TRANSFORAMINAL LUMBAR INTERBODY FUSION AND DECOMPRESSION WITH INSTRUMENTATION AND ALLOGRAFT (Left)     Patient location during evaluation: PACU Anesthesia Type: General Level of consciousness: awake and alert Pain management: pain level controlled Vital Signs Assessment: post-procedure vital signs reviewed and stable Respiratory status: spontaneous breathing, nonlabored ventilation and respiratory function stable Cardiovascular status: blood pressure returned to baseline Postop Assessment: no apparent nausea or vomiting Anesthetic complications: no   No notable events documented.  Last Vitals:  Vitals:   05/10/22 1245 05/10/22 1311  BP: 114/70 121/72  Pulse: 63 63  Resp: 12 17  Temp:  37 C  SpO2: 97% 99%    Last Pain:  Vitals:   05/10/22 1311  TempSrc: Oral  PainSc: 0-No pain                 Marthenia Rolling

## 2022-05-10 NOTE — H&P (Signed)
PREOPERATIVE H&P  Chief Complaint: Bilateral leg pain  HPI: Jessica Landry is a 67 y.o. female who presents with ongoing pain in the bilateral legs  MRI reveals stenosis and instability at L3/4   Patient has failed multiple forms of conservative care and continues to have pain (see office notes for additional details regarding the patient's full course of treatment)  Past Medical History:  Diagnosis Date   Anxiety    Arthritis    knees & back    CAD S/P percutaneous coronary angioplasty 03/07/2011   a) PCI of mRCA with Promus Element DES 3.5 mm x 28 mm (4.2 -- 3.7 mm); b) Myoview 04/2011: EF 81% (small LV Cavity), fixed basal-mid Inferior Infarct, No Ischemia, 10 METS   Complication of anesthesia    Depression 03/08/2011   Dr. Milus Banister- [place], [facility]   Essential hypertension 03/07/2011   Family history of adverse reaction to anesthesia    GERD (gastroesophageal reflux disease)    Headache    chocolate & red wine triggers /w bad headaches    History of back surgery    Hyperlipidemia with target LDL less than 70 2012    Statin Intolerance (tried Lipitor & Crestor)   Insomnia    without significant sleeping disorders   Iron deficiency anemia 03/09/2011   Neuropathy    PONV (postoperative nausea and vomiting)    ST-segment elevation myocardial infarction (STEMI) of inferior wall ([place]) 03/07/2011   RCA Occlusion --> PCI of mRCA; ECHO 03/2011: EF > 55%, MIld Inferior HK, Mild AoV Sclerosis.   Vocal cord polyp 2005   Past Surgical History:  Procedure Laterality Date   ABDOMINAL HYSTERECTOMY     APPENDECTOMY  07/04/2000   Normal appendix, adhesions noted.   BACK SURGERY     cyst on spinal cord - lumbar   BREAST BIOPSY Right 01/31/1999   fibrocystic changes, ductal adenosis, focal atypical ductal epithelium.   BREAST EXCISIONAL BIOPSY     CHOLECYSTECTOMY  03/25/1998   Dr. Bary Castilla, chronic cholecystitis.   COLONOSCOPY WITH PROPOFOL N/A 09/28/2014   Procedure:  COLONOSCOPY WITH PROPOFOL;  Surgeon: Robert Bellow, MD;  Location: [facility] ENDOSCOPY;  Service: Endoscopy;  Laterality: N/A;   CORONARY ANGIOPLASTY WITH STENT PLACEMENT  03/07/2011   inferior STEMI - RCA PROMUS 3.5 mm x 28 mm DES  (post Dil 3.7 distal to 4.2 prox)   DENTAL SURGERY  2016   ESOPHAGOGASTRODUODENOSCOPY N/A 09/28/2014   Procedure: ESOPHAGOGASTRODUODENOSCOPY (EGD);  Surgeon: Robert Bellow, MD;  Location: [facility] ENDOSCOPY;  Service: Endoscopy;  Laterality: N/A;   EYE SURGERY     blepheroplasty- bilateral   JOINT REPLACEMENT     KNEE SURGERY Left 2015   KNEE SURGERY Right 2010   Baker cyst   LEFT HEART CATH AND CORONARY ANGIOGRAPHY  03/08/2011   improved flow from the PCI   LEFT HEART CATHETERIZATION WITH CORONARY ANGIOGRAM N/A 03/07/2011   Procedure: LEFT HEART CATHETERIZATION WITH CORONARY ANGIOGRAM;  Surgeon: Leonie Man, MD;  Location: [facility] CATH LAB;  Service: Cardiovascular;  Laterality: N/A;   LEFT HEART CATHETERIZATION WITH CORONARY ANGIOGRAM N/A 03/08/2011   Procedure: LEFT HEART CATHETERIZATION WITH CORONARY ANGIOGRAM;  Surgeon: Leonie Man, MD;  Location: [facility] CATH LAB;  Service: Cardiovascular;  Laterality: N/A;   NM MYOCAR PERF WALL MOTION  08/2016   Lexiscan: LOW RISK.  No ischemia or infarction.  Hyperdynamic LV function .  EF> 65%   skin nodule Left 09/11/1999   dermatofibroma left lower leg,  positive lateral margin.   TOTAL KNEE ARTHROPLASTY Bilateral 07/20/2015   Procedure: TOTAL KNEE BILATERAL;  Surgeon: Ninetta Lights, MD;  Location: [facility];  Service: Orthopedics;  Laterality: Bilateral;   TRANSTHORACIC ECHOCARDIOGRAM  03/27/2011   LV cavity is small,EF =>55%,MILD INFERIOR HYPOKINESIS; Mild Aortic Sclerosis   TRANSTHORACIC ECHOCARDIOGRAM  04/22/2020    EF 60-65%.  GRII DD.  No RWM A.  Normal RV.  Mild LA dilation.  (This is not consistent with GRII D), mild ascending aorta dilation of 38 mm.  Otherwise normal.   UPPER GI ENDOSCOPY  02/22/2003   Dr.  Bary Castilla, normal   Social History   Socioeconomic History   Marital status: Married    Spouse name: Richardson Landry   Number of children: 0   Years of education: 16   Highest education level: Bachelor's degree (e.g., BA, AB, BS)  Occupational History   Occupation: unemployed  Tobacco Use   Smoking status: Former    Packs/day: 0.25    Years: 40.00    Total pack years: 10.00    Types: Cigarettes    Quit date: 03/07/2011    Years since quitting: 11.1   Smokeless tobacco: Never  Vaping Use   Vaping Use: Never used  Substance and Sexual Activity   Alcohol use: No    Alcohol/week: 0.0 standard drinks of alcohol   Drug use: No   Sexual activity: Yes  Other Topics Concern   Not on file  Social History Narrative   Divorced woman -- Now Remarried to her long-term partner.   Exercises routinely on a daily basis roughly 45 minutes a day walking.  Quit smoking at the time of her MI. Does not drink.   Social Determinants of Health   Financial Resource Strain: Low Risk  (07/23/2018)   Overall Financial Resource Strain (CARDIA)    Difficulty of Paying Living Expenses: Not hard at all  Food Insecurity: No Food Insecurity (07/23/2018)   Hunger Vital Sign    Worried About Running Out of Food in the Last Year: Never true    Ran Out of Food in the Last Year: Never true  Transportation Needs: No Transportation Needs (07/23/2018)   PRAPARE - Hydrologist (Medical): No    Lack of Transportation (Non-Medical): No  Physical Activity: Inactive (07/23/2018)   Exercise Vital Sign    Days of Exercise per Week: 0 days    Minutes of Exercise per Session: 0 min  Stress: No Stress Concern Present (07/23/2018)   [place]    Feeling of Stress : Not at all  Social Connections: Unknown (07/23/2018)   Social Connection and Isolation Panel [NHANES]    Frequency of Communication with Friends and Family: Not on file     Frequency of Social Gatherings with Friends and Family: Not on file    Attends Religious Services: Never    Marine scientist or Organizations: No    Attends Archivist Meetings: Never    Marital Status: Married   Family History  Problem Relation Age of Onset   Heart attack Mother    Coronary artery disease Mother    Stroke Mother    Depression Mother    Anxiety disorder Mother    Alzheimer's disease Father    Stroke Father    Breast cancer Sister 50   Depression Brother    Drug abuse Brother    Healthy Sister    Bipolar disorder  Sister    Allergies  Allergen Reactions   Bee Venom Anaphylaxis   Statins Other (See Comments)    Myalgias & fatigue   Etodolac    Prednisone Palpitations and Other (See Comments)    Can take small amounts. HR increase; increased energy    Sulfa Antibiotics Other (See Comments)    Elevated B/P, skin turns red   Prior to Admission medications   Medication Sig Start Date End Date Taking? Authorizing Provider  acetaminophen (TYLENOL) 500 MG tablet Take 500-1,000 mg by mouth every 6 (six) hours as needed (pain.).   Yes [provider]  benazepril (LOTENSIN) 10 MG tablet Take 1 tablet (10 mg total) by mouth every morning. 07/14/21  Yes Marykay Lex, MD  buPROPion (WELLBUTRIN XL) 300 MG 24 hr tablet TAKE 1 TABLET EVERY MORNING 03/12/19  Yes Eappen, Levin Bacon, MD  FLUoxetine (PROZAC) 10 MG capsule TAKE 1 CAPSULE EVERY DAY  WITH  40MG  03/12/19  Yes Eappen, 14/3/20, MD  FLUoxetine (PROZAC) 40 MG capsule TAKE 1 CAPSULE EVERY DAY BEFORE BREAKFAST  WITH  10MG   CAP 03/12/19  Yes Eappen, , MD  gabapentin (NEURONTIN) 300 MG capsule Take 2 capsules (600 mg total) by mouth at bedtime. 09/05/18  Yes Bacigalupo, Levin Bacon, MD  metoprolol tartrate (LOPRESSOR) 50 MG tablet TAKE 1 TABLET TWICE DAILY 05/09/21  Yes Marzella Schlein, MD  nitroGLYCERIN (NITROSTAT) 0.4 MG SL tablet Place 1 tablet (0.4 mg total) under the tongue every 5 (five) minutes  as needed for chest pain (up to 3 tabs in 15 mins and then call 911). Patient not taking: Reported on 05/08/2022 07/25/16  Yes 05/10/2022, MD  omeprazole (PRILOSEC) 20 MG capsule TAKE 1 CAPSULE(20 MG) BY MOUTH DAILY 03/10/18  Yes Bacigalupo, Marykay Lex, MD  tiZANidine (ZANAFLEX) 4 MG tablet Take 2 mg by mouth 3 (three) times daily as needed for muscle spasms. 03/14/22  Yes [provider]  aspirin EC 81 MG tablet Take 1 tablet (81 mg total) by mouth daily. Patient not taking: Reported on 05/08/2022 03/21/18   05/10/2022, MD  CYANOCOBALAMIN PO Take 1 tablet by mouth daily.    [provider]  Evolocumab (REPATHA SURECLICK) 140 MG/ML SOAJ ADMINISTER 140MG (1 ML) UNDER THE SKIN EVERY 2 WEEKS 05/01/22   Marykay Lex, MD  fluticasone [facility]) 50 MCG/ACT nasal spray Place into the nose. Patient not taking: Reported on 05/08/2022 12/17/18 12/17/19  [provider]  mupirocin ointment (BACTROBAN) 2 % Apply topically 3 (three) times daily. Patient not taking: Reported on 05/08/2022 03/16/22   [provider]     All other systems have been reviewed and were otherwise negative with the exception of those mentioned in the HPI and as above.  Physical Exam: Vitals:   05/10/22 0554  BP: 139/80  Pulse: 61  Resp: 20  Temp: 97.6 F (36.4 C)  SpO2: 98%    Body mass index is 25.82 kg/m.  General: Alert, no acute distress Cardiovascular: No pedal edema Respiratory: No cyanosis, no use of accessory musculature Skin: No lesions in the area of chief complaint Neurologic: Sensation intact distally Psychiatric: Patient is competent for consent with normal mood and affect Lymphatic: No axillary or cervical lymphadenopathy   Assessment/Plan: Bilateral leg pain with a history very consistent with neurogenic claudication and lumbar radiculopathy.  The patient's MRI is notable for instability and stenosis at L3-L4. Plan for Procedure(s): LEFT-SIDED LUMBAR 3 - LUMBAR  4 TRANSFORAMINAL LUMBAR INTERBODY FUSION AND DECOMPRESSION WITH  INSTRUMENTATION AND ALLOGRAFT   Norva Karvonen, MD 05/10/2022 6:54 AM

## 2022-05-11 DIAGNOSIS — M4316 Spondylolisthesis, lumbar region: Secondary | ICD-10-CM | POA: Diagnosis not present

## 2022-05-11 MED ORDER — METHOCARBAMOL 500 MG PO TABS: 500.0000 mg | ORAL_TABLET | Freq: Four times a day (QID) | ORAL | 1 refills | Status: DC | PRN

## 2022-05-11 MED ORDER — OXYCODONE-ACETAMINOPHEN 5-325 MG PO TABS: 1.0000 | ORAL_TABLET | ORAL | 0 refills | Status: DC | PRN

## 2022-05-11 MED FILL — Thrombin For Soln 20000 Unit: CUTANEOUS | Qty: 1 | Status: AC

## 2022-05-11 NOTE — TOC Transition Note (Signed)
Transition of Care [facility] - CM/SW Discharge Note   Patient Details  Name: Jessica Landry MRN: [id number] Date of Birth: 08-18-1955  Transition of Care [facility]) CM/SW Contact:  Pollie Friar, RN Phone Number: 05/11/2022, 12:58 PM   Clinical Narrative:    Pt is discharging home with home health services through [facility]. Information on the AVS.  Pt will be provided needed DME from bedside RN.  Pt has transportation home.    Final next level of care: Home w Home Health Services Barriers to Discharge: No Barriers Identified   Patient Goals and CMS Choice CMS Medicare.gov Compare Post Acute Care list provided to:: Patient    Discharge Placement                         Discharge Plan and Services Additional resources added to the After Visit Summary for                            [facility] Arranged: PT, OT [facility] Agency: [place] Date [place]: 05/11/22   Representative spoke with at [place]: [place] Determinants of Health ([place]) Interventions SDOH Screenings   Food Insecurity: No Food Insecurity (07/23/2018)  Transportation Needs: No Transportation Needs (07/23/2018)  Financial Resource Strain: Low Risk  (07/23/2018)  Physical Activity: Inactive (07/23/2018)  Social Connections: Unknown (07/23/2018)  Stress: No Stress Concern Present (07/23/2018)  Tobacco Use: Medium Risk (05/10/2022)     Readmission Risk Interventions     No data to display

## 2022-05-11 NOTE — Plan of Care (Incomplete)
  Problem: Education: Goal: Ability to verbalize activity precautions or restrictions will improve Outcome: Completed/Met Goal: Knowledge of the prescribed therapeutic regimen will improve Outcome: Completed/Met Goal: Understanding of discharge needs will improve Outcome: Completed/Met   Problem: Activity: Goal: Ability to avoid complications of mobility impairment will improve Outcome: Completed/Met Goal: Ability to tolerate increased activity will improve Outcome: Completed/Met Goal: Will remain free from falls Outcome: Completed/Met   Problem: Bowel/Gastric: Goal: Gastrointestinal status for postoperative course will improve Outcome: Completed/Met   Problem: Clinical Measurements: Goal: Ability to maintain clinical measurements within normal limits will improve Outcome: Completed/Met Goal: Postoperative complications will be avoided or minimized Outcome: Completed/Met Goal: Diagnostic test results will improve Outcome: Completed/Met   Problem: Pain Management: Goal: Pain level will decrease Outcome: Completed/Met   Problem: Health Behavior/Discharge Planning: Goal: Identification of resources available to assist in meeting health care needs will improve Outcome: Completed/Met   Problem: Bladder/Genitourinary: Goal: Urinary functional status for postoperative course will improve Outcome: Completed/Met  Patient alert and oriented, ambulate, void, VSS, surgical site clean and dry. D/c instructions explain and given. Pt. D/c home per order.

## 2022-05-11 NOTE — Evaluation (Signed)
Occupational Therapy Evaluation Patient Details Name: Jessica Landry MRN: [id number] DOB: 26-May-1955 Today's Date: 05/11/2022   History of Present Illness Pt is a 67 y.o. female s/p L sided L3-4 TLIF 05/10/22 and decompression with instrumentation and allograft. PMH significant for CAD s/p percutaneous coronary angioplasty, essential HTN, GERD, HLD, STEMI, back surgery, L and R knee surgeries.   Clinical Impression   PTA, pt performing ADL independently. Upon eval, pt requires up to min guard for LB ADL and mod A for UB ADL for brace application., Pt and husband educated and demonstrating use of compensatory techniques for UB ADL, LB ADL, shower transfers, brace application, and grooming. Recommending discharge home with [place] for continued carryover of precautions and safety during transfers.      Recommendations for follow up therapy are one component of a multi-disciplinary discharge planning process, led by the attending physician.  Recommendations may be updated based on patient status, additional functional criteria and insurance authorization.   Follow Up Recommendations  Home health OT     Assistance Recommended at Discharge Intermittent Supervision/Assistance  Patient can return home with the following A little help with walking and/or transfers;A little help with bathing/dressing/bathroom;Assistance with cooking/housework;Assist for transportation;Help with stairs or ramp for entrance    Functional Status Assessment  Patient has had a recent decline in their functional status and demonstrates the ability to make significant improvements in function in a reasonable and predictable amount of time.  Equipment Recommendations  None recommended by OT    Recommendations for Other Services       Precautions / Restrictions Precautions Precautions: Back Precaution Booklet Issued: Yes (comment) Precaution Comments: Reviewed Required Braces or Orthoses: Spinal Brace Spinal Brace:  Thoracolumbosacral orthotic;Applied in sitting position Restrictions Weight Bearing Restrictions: No      Mobility Bed Mobility Overal bed mobility: Modified Independent             General bed mobility comments: Demonstrates safe log roll technique. States she has been practicing    Transfers Overall transfer level: Needs assistance Equipment used: Rolling walker (2 wheels) Transfers: Sit to/from Stand Sit to Stand: Min guard           General transfer comment: Min guard for safety. Slow and effortful rise. Cues for technique. Practiced a couple of times with slight posterior instability noted, using back of legs to brace. Educated on forward pressure through RW for support upon standing.      Balance Overall balance assessment: Needs assistance Sitting-balance support: No upper extremity supported, Feet supported Sitting balance-Leahy Scale: Fair     Standing balance support: Single extremity supported, During functional activity Standing balance-Leahy Scale: Poor Standing balance comment: Intermittent LOB when pt attempts to stand without RW support.                           ADL either performed or assessed with clinical judgement   ADL Overall ADL's : Needs assistance/impaired Eating/Feeding: Independent   Grooming: Supervision/safety;Standing   Upper Body Bathing: Set up;Sitting   Lower Body Bathing: Min guard;Sit to/from stand   Upper Body Dressing : Moderate assistance Upper Body Dressing Details (indicate cue type and reason): for brace application Lower Body Dressing: Min guard;Sit to/from stand Lower Body Dressing Details (indicate cue type and reason): can do with min guard A; however, husband observed to indep assist Toilet Transfer: Min guard;Ambulation;Rolling walker (2 wheels) Toilet Transfer Details (indicate cue type and reason): Min guard A fopr rise  and increased time     Tub/ Shower Transfer: Min  guard;Ambulation;BSC/3in1;Rolling walker (2 wheels);Walk-in Lobbyist Details (indicate cue type and reason): min guard A and cues for safety Functional mobility during ADLs: Min guard       Vision         Perception     Praxis      Pertinent Vitals/Pain Pain Assessment Faces Pain Scale: Hurts even more Pain Location: back Pain Descriptors / Indicators: Aching, Guarding     Hand Dominance Right   Extremity/Trunk Assessment Upper Extremity Assessment Upper Extremity Assessment: Generalized weakness   Lower Extremity Assessment Lower Extremity Assessment: Defer to PT evaluation   Cervical / Trunk Assessment Cervical / Trunk Assessment: Back Surgery   Communication Communication Communication: No difficulties   Cognition Arousal/Alertness: Awake/alert Behavior During Therapy: WFL for tasks assessed/performed Overall Cognitive Status: Within Functional Limits for tasks assessed                                 General Comments: cues for precautions but likely due to medication     General Comments  Reviewed precautions    Exercises Exercises: General Lower Extremity General Exercises - Lower Extremity Ankle Circles/Pumps: AROM, Both, 10 reps, Supine Quad Sets: Strengthening, Both, 10 reps, Seated Gluteal Sets: Strengthening, Both, 10 reps, Seated   Shoulder Instructions      Home Living Family/patient expects to be discharged to:: Private residence Living Arrangements: Spouse/significant other Available Help at Discharge: Family;Available 24 hours/day Type of Home: House Home Access: Stairs to enter [employer] of Steps: 4 Entrance Stairs-Rails: Right Home Layout: One level     Bathroom Shower/Tub: Occupational psychologist: Handicapped height     Home Equipment: Conservation officer, nature (2 wheels);Cane - single point;BSC/3in1;Crutches          Prior Functioning/Environment Prior Level of Function :  Independent/Modified Independent;History of Falls (last six months)             Mobility Comments: Reports about 3 falls in the past 6 months. ADLs Comments: States she can perform ADLs herself but sometimes asks her husband for help with tasks like buttoning her shirt.        OT Problem List: Decreased strength;Decreased activity tolerance;Impaired balance (sitting and/or standing);Decreased safety awareness;Decreased knowledge of use of DME or AE;Decreased knowledge of precautions      OT Treatment/Interventions: Self-care/ADL training;Therapeutic exercise;DME and/or AE instruction;Therapeutic activities;Patient/family education;Balance training    OT Goals(Current goals can be found in the care plan section) Acute Rehab OT Goals Patient Stated Goal: go home OT Goal Formulation: With patient Time For Goal Achievement: 05/25/22 Potential to Achieve Goals: Good  OT Frequency: Min 2X/week    Co-evaluation              AM-PAC OT "6 Clicks" Daily Activity     Outcome Measure Help from another person eating meals?: None Help from another person taking care of personal grooming?: A Little Help from another person toileting, which includes using toliet, bedpan, or urinal?: A Little Help from another person bathing (including washing, rinsing, drying)?: A Little Help from another person to put on and taking off regular upper body clothing?: A Little Help from another person to put on and taking off regular lower body clothing?: A Little 6 Click Score: 19   End of Session Equipment Utilized During Treatment: Gait belt;Rolling walker (2 wheels);Back brace Nurse Communication: Mobility  status  Activity Tolerance: Patient tolerated treatment well Patient left: in bed;with call bell/phone within reach;with family/visitor present  OT Visit Diagnosis: Unsteadiness on feet (R26.81);Muscle weakness (generalized) (M62.81);Other abnormalities of gait and mobility (R26.89);Pain Pain -  part of body:  (back)                Time: 0737-1062 OT Time Calculation (min): 36 min Charges:  OT General Charges $OT Visit: 1 Visit OT Evaluation $OT Eval Low Complexity: 1 Low OT Treatments $Self Care/Home Management : 8-22 mins  Elder Cyphers, OTR/L [facility] Acute Rehabilitation Office: [phone]   Magnus Ivan 05/11/2022, 12:26 PM

## 2022-05-11 NOTE — Evaluation (Signed)
Physical Therapy Evaluation Patient Details Name: Jessica Landry MRN: [id number] DOB: 1955/10/29 Today's Date: 05/11/2022  History of Present Illness  Pt is a 67 y.o. female s/p L sided L3-4 TLIF 05/10/22 and decompression with instrumentation and allograft. PMH significant for CAD s/p percutaneous coronary angioplasty, essential HTN, GERD, HLD, STEMI, back surgery, L and R knee surgeries.  Clinical Impression  Patient is s/p above surgery resulting in functional limitations due to the deficits listed below (see PT Problem List). Educated on precautions and safe mobility techniques including transfers, ambulation with a RW, and stair navigation. Requires min guard and intermittent cues for safety awareness. Becomes distracted and releases RW resulting in loss of balance. Able to self correct but will greatly benefit from HHPT for follow-up to reduce fall risk and optimize recovery post-op. Patient will benefit from skilled PT to increase their independence and safety with mobility to allow discharge to the venue listed below.          Recommendations for follow up therapy are one component of a multi-disciplinary discharge planning process, led by the attending physician.  Recommendations may be updated based on patient status, additional functional criteria and insurance authorization.  Follow Up Recommendations Home health PT      Assistance Recommended at Discharge Intermittent Supervision/Assistance  Patient can return home with the following  A little help with walking and/or transfers;A little help with bathing/dressing/bathroom;Assistance with cooking/housework;Assist for transportation    Equipment Recommendations None recommended by PT  Recommendations for Other Services       Functional Status Assessment Patient has had a recent decline in their functional status and demonstrates the ability to make significant improvements in function in a reasonable and predictable amount of time.      Precautions / Restrictions Precautions Precautions: Back Precaution Booklet Issued: Yes (comment) Precaution Comments: Reviewed Required Braces or Orthoses: Spinal Brace Spinal Brace: Thoracolumbosacral orthotic;Applied in sitting position Restrictions Weight Bearing Restrictions: No      Mobility  Bed Mobility Overal bed mobility: Modified Independent             General bed mobility comments: Demonstrates safe log roll technique. States she has been practicing    Transfers Overall transfer level: Needs assistance Equipment used: Rolling walker (2 wheels) Transfers: Sit to/from Stand Sit to Stand: Min guard           General transfer comment: Min guard for safety. Slow and effortful rise. Cues for technique. Practiced a couple of times with slight posterior instability noted, using back of legs to brace. Educated on forward pressure through RW for support upon standing.    Ambulation/Gait Ambulation/Gait assistance: Min guard Gait Distance (Feet): 100 Feet Assistive device: Rolling walker (2 wheels) Gait Pattern/deviations: Step-through pattern, Decreased stride length, Staggering left, Staggering right, Narrow base of support, Leaning posteriorly Gait velocity: decr Gait velocity interpretation: <1.8 ft/sec, indicate of risk for recurrent falls   General Gait Details: Educated on safe AD use with RW. Demonstrates intermittent staggering when she releases RW or gets distracted. Required cues several times during session to keep hands on RW due to instability.  No buckling noted.  Stairs Stairs: Yes Stairs assistance: Min guard Stair Management: One rail Right, Step to pattern, Sideways, Forwards Number of Stairs: 4 General stair comments: Practiced forward and lateral approach with education for technique and sequencing. Performed safely and prefers lateral approach. Will have husband guard at d/c. No LOB during task.  Wheelchair Mobility    Modified  [place] (Stroke Patients  Only)       Balance Overall balance assessment: Needs assistance Sitting-balance support: No upper extremity supported, Feet supported Sitting balance-Leahy Scale: Fair     Standing balance support: Single extremity supported, During functional activity Standing balance-Leahy Scale: Poor Standing balance comment: Intermittent LOB when pt attempts to stand without RW support.                             Pertinent Vitals/Pain Pain Assessment Pain Assessment: Faces Faces Pain Scale: Hurts even more Pain Location: back Pain Descriptors / Indicators: Aching, Guarding Pain Intervention(s): Limited activity within patient's tolerance, Premedicated before session, Repositioned, Monitored during session    [place] expects to be discharged to:: Private residence Living Arrangements: Spouse/significant other Available Help at Discharge: Family;Available 24 hours/day Type of Home: House Home Access: Stairs to enter Entrance Stairs-Rails: Right Entrance Stairs-Number of Steps: 4   Home Layout: One level Home Equipment: Conservation officer, nature (2 wheels);Cane - single point;BSC/3in1;Crutches      Prior Function Prior Level of Function : Independent/Modified Independent;History of Falls (last six months)             Mobility Comments: Reports about 3 falls in the past 6 months. ADLs Comments: States she can perform ADLs herself but sometimes asks her husband for help with tasks like buttoning her shirt.     Hand Dominance   Dominant Hand: Right    Extremity/Trunk Assessment   Upper Extremity Assessment Upper Extremity Assessment: Defer to OT evaluation    Lower Extremity Assessment Lower Extremity Assessment: Generalized weakness (Grossly 4+/5 throughout LEs, no significant focal weakness)    Cervical / Trunk Assessment Cervical / Trunk Assessment: Back Surgery  Communication   Communication: No difficulties  Cognition  Arousal/Alertness: Awake/alert Behavior During Therapy: WFL for tasks assessed/performed Overall Cognitive Status: Within Functional Limits for tasks assessed                                          General Comments General comments (skin integrity, edema, etc.): Reviewed precautions    Exercises General Exercises - Lower Extremity Ankle Circles/Pumps: AROM, Both, 10 reps, Supine Quad Sets: Strengthening, Both, 10 reps, Seated Gluteal Sets: Strengthening, Both, 10 reps, Seated   Assessment/Plan    PT Assessment Patient needs continued PT services  PT Problem List Decreased strength;Decreased activity tolerance;Decreased balance;Decreased mobility;Decreased coordination;Decreased knowledge of use of DME;Decreased knowledge of precautions;Decreased safety awareness;Pain       PT Treatment Interventions DME instruction;Gait training;Stair training;Functional mobility training;Therapeutic activities;Therapeutic exercise;Balance training;Neuromuscular re-education;Patient/family education    PT Goals (Current goals can be found in the Care Plan section)  Acute Rehab PT Goals Patient Stated Goal: go home, get my strength back PT Goal Formulation: With patient Time For Goal Achievement: 05/18/22 Potential to Achieve Goals: Good    Frequency Min 4X/week     Co-evaluation               AM-PAC PT "6 Clicks" Mobility  Outcome Measure Help needed turning from your back to your side while in a flat bed without using bedrails?: None Help needed moving from lying on your back to sitting on the side of a flat bed without using bedrails?: None Help needed moving to and from a bed to a chair (including a wheelchair)?: A Little Help needed standing up from a chair  using your arms (e.g., wheelchair or bedside chair)?: A Little Help needed to walk in hospital room?: A Little Help needed climbing 3-5 steps with a railing? : A Little 6 Click Score: 20    End of  Session Equipment Utilized During Treatment: Gait belt Activity Tolerance: Patient tolerated treatment well Patient left: in chair;with call bell/phone within reach Nurse Communication: Mobility status PT Visit Diagnosis: Unsteadiness on feet (R26.81);Other abnormalities of gait and mobility (R26.89);Repeated falls (R29.6);Muscle weakness (generalized) (M62.81);History of falling (Z91.81);Difficulty in walking, not elsewhere classified (R26.2);Pain Pain - part of body:  (back)    Time: 6283-6629 PT Time Calculation (min) (ACUTE ONLY): 27 min   Charges:   PT Evaluation $PT Eval Low Complexity: 1 Low PT Treatments $Gait Training: 8-22 mins        Candie Mile, PT, DPT Physical Therapist Acute Rehabilitation Services [place]   Ellouise Newer 05/11/2022, 10:10 AM

## 2022-05-11 NOTE — Progress Notes (Signed)
    Patient doing well PO day 1 S/P L3-4 TLIF for BIL leg pain. She reports some LBP but has not had her px medication yet this morning. She has been up and walking last night and this morning and is awaiting OT. She has had breakfast and denies N/V/D, NL B/B function.   Physical Exam: Vitals:   05/11/22 0640 05/11/22 0926  BP: (!) 95/50 (!) 103/56  Pulse: 71 74  Resp: 18 16  Temp: 98.6 F (37 C) 99.9 F (37.7 C)  SpO2: 97% 99%    Dressing in place, CDI, laying comfortably in bed, TLSO at bedside  NVI  POD #1 s/p L3-4 TLIF with post instrumentation for BIL leg pain  - Doing well, resolved leg pain, expected PO LBP - up with PT/OT, encourage ambulation - Percocet for pain, Robaxin for muscle spasms - likely d/c home today with f/u in 2 weeks

## 2022-05-15 ENCOUNTER — Encounter (HOSPITAL_COMMUNITY): Payer: Self-pay | Admitting: Orthopedic Surgery

## 2022-05-17 NOTE — Progress Notes (Signed)
Primary Care Provider: Kirk Ruths, MD [place] Cardiologist: Glenetta Hew, MD Electrophysiologist: None  Referring Provider: Kirk Ruths, MD  Clinic Note: Chief Complaint  Patient presents with   Hospitalization Follow-up    Postop cardiology evaluation after back surgery.  This was actually her routine follow-up visit but wanted to be seen to discuss meds.   Coronary Artery Disease    No active angina.    ===================================  ASSESSMENT/PLAN   Problem List Items Addressed This Visit       Cardiology Problems   ST elevation myocardial infarction (STEMI) of inferior wall, subsequent episode of care [facility]) (Chronic)    Just over 11 years out from inferior MI with occluded RCA.  After initially having recurrent pain following initial PCI, has not had any further symptoms.  Stable EF with no active CHF symptoms.      Hyperlipidemia with target LDL less than 70; Statin Intolerant (Chronic)    Has not tolerated statins in the past because of leg pain.  Is now on Repatha with borderline lipid control.  Currently waiting for follow-up labs to see if she should start Zetia or not.  If not at goal of LDL less than 70, would start 10 mg Zetia.  She has prescription.  Labs due in March.  Continue healthy diet.      Essential hypertension (Chronic)    Stable BP on current meds.  No change to current doses of benazepril 10 mg daily and Lopressor 25 mg twice daily.      CAD S/P PCI mRCA: Promus Element DES 3.5 mm x 28 mm (4.2-37 mm)) - Primary (Chronic)    No recurrent anginal symptoms since her MI.  Doing well.  Open to get back to her routine level of exercise and activity once she recovers from her surgery.  BP is stable.  Lipids are borderline.  Plan:  Continue aspirin without Plavix, okay to hold 5 days preop On moderate dose Lopressor and benazepril with well-controlled blood pressures. Tolerating Repatha, last lipids were not  quite at goal.  She does have a prescription for Zetia which we will have her hold onto until her labs are rechecked.  If LDL not at goal, she will then start the 10 mg.        Other   Myalgia due to statin (Chronic)    Did not do well with statins.  Was chronic issue.  Started on Repatha years ago.  Initially LDL was really well-controlled and she has had some setbacks.  May require the addition of Zetia.      ===================================  HPI:    Jessica Landry is a 67 y.o. female with a PMH Notable for CAD and PCI in Setting of Inferior STEMI (RCA PCI Nov 2012) who presents today for posthospital follow-up.  I last saw Jessica Landry on February 27, 2021: This was 10 years out from her MI.  She was doing very well.  And is overall doing well.  Started on vitamin B12 and improved energy level significantly.  Maybe a little lack of get up and go during the day but improved dramatically.  No active cardiac symptoms.  Just mild end of day swelling.  Somewhat deconditioned.  Had noted weight loss.  Rare palpitations.  She was last seen on May 08, 2022 by Coletta Memos, NP for preop evaluation for back surgery.  He close activity limited by back pain but easily able to do > 4 METS.  Stable BP.  No active cardiac symptoms. => Given the okay for surgery.  Recent Hospitalizations:  05/10/2022: Left-sided L3-L4 lumbar interbody fusion and decompression.  Reviewed  CV studies:    The following studies were reviewed today: (if available, images/films reviewed: From Epic Chart or Care Everywhere) None:  Interval History:   Jessica Landry returns here today for follow-up doing quite well.  She feels much better after having back surgery.  She now does not have the pain which was limiting her activity.  She is just had some rare fleeting palpitations off-and-on but no irregular heart rhythms or dizziness, lightheadedness, wooziness, syncope/near syncope or TIA/amaurosis fugax.  No  chest pain or pressure at rest or exertion. Overall remains asymptomatic regarding standpoint.  CV Review of Symptoms (Summary): no chest pain or dyspnea on exertion negative for - edema, irregular heartbeat, orthopnea, palpitations, paroxysmal nocturnal dyspnea, rapid heart rate, shortness of breath, or lightheadedness, dizziness or wooziness, syncope/near syncope or TIA/amaurosis fugax, claudication.  REVIEWED OF SYSTEMS   Review of Systems  Constitutional:  Negative for malaise/fatigue (Little deconditioned with the back pain but now excited to get back and exercising.) and weight loss.  HENT:  Negative for congestion and nosebleeds.   Respiratory:  Negative for cough, shortness of breath and wheezing.   Gastrointestinal:  Negative for abdominal pain, blood in stool and melena.  Genitourinary:  Negative for hematuria.  Musculoskeletal:  Positive for back pain (Back pain is notably improved, to somewhat stiff) and joint pain.  Neurological:  Negative for dizziness and focal weakness.       Underlying poor balance, hoping that with the back surgery this will improve.  Psychiatric/Behavioral:  Negative for depression and memory loss. The patient is nervous/anxious (At baseline.  Stable.). The patient does not have insomnia.    I have reviewed and (if needed) personally updated the patient's problem list, medications, allergies, past medical and surgical history, social and family history.   PAST MEDICAL HISTORY   Past Medical History:  Diagnosis Date   Anxiety    Arthritis    knees & back    CAD S/P percutaneous coronary angioplasty 03/07/2011   a) PCI of mRCA with Promus Element DES 3.5 mm x 28 mm (4.2 -- 3.7 mm); b) Myoview 04/2011: EF 81% (small LV Cavity), fixed basal-mid Inferior Infarct, No Ischemia, 10 METS   Complication of anesthesia    Depression 03/08/2011   Dr. Milus Banister- [place], [facility]   Essential hypertension 03/07/2011   Family history of adverse reaction to anesthesia     GERD (gastroesophageal reflux disease)    Headache    chocolate & red wine triggers /w bad headaches    History of back surgery    Hyperlipidemia with target LDL less than 70 2012    Statin Intolerance (tried Lipitor & Crestor)   Insomnia    without significant sleeping disorders   Iron deficiency anemia 03/09/2011   Neuropathy    PONV (postoperative nausea and vomiting)    ST-segment elevation myocardial infarction (STEMI) of inferior wall ([place]) 03/07/2011   RCA Occlusion --> PCI of mRCA; ECHO 03/2011: EF > 55%, MIld Inferior HK, Mild AoV Sclerosis.   Vocal cord polyp 2005    PAST SURGICAL HISTORY   Past Surgical History:  Procedure Laterality Date   ABDOMINAL HYSTERECTOMY     APPENDECTOMY  07/04/2000   Normal appendix, adhesions noted.   BACK SURGERY     cyst on spinal cord - lumbar   BREAST BIOPSY Right  01/31/1999   fibrocystic changes, ductal adenosis, focal atypical ductal epithelium.   BREAST EXCISIONAL BIOPSY     CHOLECYSTECTOMY  03/25/1998   Dr. Bary Castilla, chronic cholecystitis.   COLONOSCOPY WITH PROPOFOL N/A 09/28/2014   Procedure: COLONOSCOPY WITH PROPOFOL;  Surgeon: Robert Bellow, MD;  Location: [facility] ENDOSCOPY;  Service: Endoscopy;  Laterality: N/A;   CORONARY ANGIOPLASTY WITH STENT PLACEMENT  03/07/2011   inferior STEMI - RCA PROMUS 3.5 mm x 28 mm DES  (post Dil 3.7 distal to 4.2 prox)   DENTAL SURGERY  2016   ESOPHAGOGASTRODUODENOSCOPY N/A 09/28/2014   Procedure: ESOPHAGOGASTRODUODENOSCOPY (EGD);  Surgeon: Robert Bellow, MD;  Location: [facility] ENDOSCOPY;  Service: Endoscopy;  Laterality: N/A;   EYE SURGERY     blepheroplasty- bilateral   JOINT REPLACEMENT     KNEE SURGERY Left 2015   KNEE SURGERY Right 2010   Baker cyst   LEFT HEART CATH AND CORONARY ANGIOGRAPHY  03/08/2011   improved flow from the PCI   LEFT HEART CATHETERIZATION WITH CORONARY ANGIOGRAM N/A 03/07/2011   Procedure: LEFT HEART CATHETERIZATION WITH CORONARY ANGIOGRAM;  Surgeon: Leonie Man, MD;  Location: [facility] CATH LAB;  Service: Cardiovascular;  Laterality: N/A;   LEFT HEART CATHETERIZATION WITH CORONARY ANGIOGRAM N/A 03/08/2011   Procedure: LEFT HEART CATHETERIZATION WITH CORONARY ANGIOGRAM;  Surgeon: Leonie Man, MD;  Location: [facility] CATH LAB;  Service: Cardiovascular;  Laterality: N/A;   NM MYOCAR PERF WALL MOTION  08/2016   Lexiscan: LOW RISK.  No ischemia or infarction.  Hyperdynamic LV function .  EF> 65%   skin nodule Left 09/11/1999   dermatofibroma left lower leg, positive lateral margin.   TOTAL KNEE ARTHROPLASTY Bilateral 07/20/2015   Procedure: TOTAL KNEE BILATERAL;  Surgeon: Ninetta Lights, MD;  Location: [place];  Service: Orthopedics;  Laterality: Bilateral;   TRANSFORAMINAL LUMBAR INTERBODY FUSION (TLIF) WITH PEDICLE SCREW FIXATION 1 LEVEL Left 05/10/2022   Procedure: LEFT-SIDED LUMBAR THREE - LUMBAR FOUR TRANSFORAMINAL LUMBAR INTERBODY FUSION AND DECOMPRESSION WITH INSTRUMENTATION AND ALLOGRAFT;  Surgeon: Phylliss Bob, MD;  Location: [place];  Service: Orthopedics;  Laterality: Left;   TRANSTHORACIC ECHOCARDIOGRAM  03/27/2011   LV cavity is small,EF =>55%,MILD INFERIOR HYPOKINESIS; Mild Aortic Sclerosis   TRANSTHORACIC ECHOCARDIOGRAM  04/22/2020    EF 60-65%.  GRII DD.  No RWM A.  Normal RV.  Mild LA dilation.  (This is not consistent with GRII D), mild ascending aorta dilation of 38 mm.  Otherwise normal.   UPPER GI ENDOSCOPY  02/22/2003   Dr. Bary Castilla, normal   Zio patch monitor March 2022: Brabham sinus rhythm with a rate range 47-93 bpm, average 59 bpm.  No A-fib or other arrhythmias.  Rare PACs and PVCs.  Suggested Mobitz 2 block, not correct.  Occasional interpolated PVCs.  Most probably sinus bradycardia.  MEDICATIONS/ALLERGIES   Current Meds  Medication Sig   acetaminophen (TYLENOL) 500 MG tablet Take 500-1,000 mg by mouth every 6 (six) hours as needed (pain.).   benazepril (LOTENSIN) 10 MG tablet Take 1 tablet (10 mg total) by mouth every morning.    buPROPion (WELLBUTRIN XL) 300 MG 24 hr tablet TAKE 1 TABLET EVERY MORNING   CYANOCOBALAMIN PO Take 1 tablet by mouth daily.   Evolocumab (REPATHA SURECLICK) XX123456 MG/ML SOAJ ADMINISTER 140MG(1 ML) UNDER THE SKIN EVERY 2 WEEKS   FLUoxetine (PROZAC) 10 MG capsule TAKE 1 CAPSULE EVERY DAY  WITH  40MG   FLUoxetine (PROZAC) 40 MG capsule TAKE 1 CAPSULE EVERY DAY BEFORE BREAKFAST  WITH  10MG  CAP   gabapentin (NEURONTIN) 300 MG capsule Take 2 capsules (600 mg total) by mouth at bedtime.   methocarbamol (ROBAXIN) 500 MG tablet Take 1-2 tablets (500-1,000 mg total) by mouth every 6 (six) hours as needed for muscle spasms.   metoprolol tartrate (LOPRESSOR) 50 MG tablet TAKE 1 TABLET TWICE DAILY   mupirocin ointment (BACTROBAN) 2 % Apply topically 3 (three) times daily.   nitroGLYCERIN (NITROSTAT) 0.4 MG SL tablet Place 1 tablet (0.4 mg total) under the tongue every 5 (five) minutes as needed for chest pain (up to 3 tabs in 15 mins and then call 911).   omeprazole (PRILOSEC) 20 MG capsule TAKE 1 CAPSULE(20 MG) BY MOUTH DAILY   oxyCODONE-acetaminophen (PERCOCET/ROXICET) 5-325 MG tablet Take 1-2 tablets by mouth every 4 (four) hours as needed for severe pain.    Allergies  Allergen Reactions   Bee Venom Anaphylaxis   Statins Other (See Comments)    Myalgias & fatigue   Etodolac    Prednisone Palpitations and Other (See Comments)    Can take small amounts. HR increase; increased energy    Sulfa Antibiotics Other (See Comments)    Elevated B/P, skin turns red    SOCIAL HISTORY/FAMILY HISTORY   Reviewed in Epic:  Pertinent findings:  Social History   Tobacco Use   Smoking status: Former    Packs/day: 0.25    Years: 40.00    Total pack years: 10.00    Types: Cigarettes    Quit date: 03/07/2011    Years since quitting: 11.2   Smokeless tobacco: Never  Vaping Use   Vaping Use: Never used  Substance Use Topics   Alcohol use: No    Alcohol/week: 0.0 standard drinks of alcohol   Drug use:  No   Social History   Social History Narrative   Divorced woman -- Now Remarried to her long-term partner.   Exercises routinely on a daily basis roughly 45 minutes a day walking.  Quit smoking at the time of her MI. Does not drink.    OBJCTIVE -PE, EKG, labs   Wt Readings from Last 3 Encounters:  05/18/22 160 lb (72.6 kg)  05/10/22 160 lb (72.6 kg)  05/08/22 164 lb 6.4 oz (74.6 kg)    Physical Exam: BP 132/72   Pulse 61   Ht 5' 7"$  (1.702 m)   Wt 160 lb (72.6 kg)   SpO2 98%   BMI 25.06 kg/m  Physical Exam Vitals reviewed.  Constitutional:      General: She is not in acute distress.    Appearance: Normal appearance. She is normal weight. She is not ill-appearing or toxic-appearing.  HENT:     Head: Normocephalic and atraumatic.  Neck:     Vascular: No carotid bruit (Soft radiated aortic murmur.).  Cardiovascular:     Rate and Rhythm: Normal rate and regular rhythm.     Pulses: Normal pulses.     Heart sounds: Murmur (Soft 1/6 SEM at RUSB) heard.     No friction rub. No gallop.  Pulmonary:     Effort: Pulmonary effort is normal. No respiratory distress.     Breath sounds: Normal breath sounds. No wheezing, rhonchi or rales.  Chest:     Chest wall: No tenderness.  Musculoskeletal:        General: Swelling (Trivial) present. Normal range of motion.     Cervical back: Normal range of motion and neck supple.  Skin:    General: Skin is warm  and dry.  Neurological:     General: No focal deficit present.     Mental Status: She is alert and oriented to person, place, and time.     Gait: Gait abnormal (Recovering from back surgery).  Psychiatric:        Mood and Affect: Mood normal.        Behavior: Behavior normal.        Thought Content: Thought content normal.        Judgment: Judgment normal.     Adult ECG Report  N/a  Recent Labs: Reviewed.  Due for labs to be checked by PCP soon.  Plan is for March. Complete labs sent from March 2023-LDL was 74. Lab Results   Component Value Date   CHOL 170 03/23/2019   HDL 88 03/23/2019   LDLCALC 70 03/23/2019   TRIG 63 03/23/2019   CHOLHDL 1.9 03/23/2019   Lab Results  Component Value Date   CREATININE 1.13 (H) 05/04/2022   BUN 10 05/04/2022   NA 137 05/04/2022   K 3.8 05/04/2022   CL 102 05/04/2022   CO2 30 05/04/2022      Latest Ref Rng & Units 05/04/2022    8:38 AM 12/31/2016   12:01 PM 08/14/2016    4:54 AM  CBC  WBC 4.0 - 10.5 K/uL 5.6  7.7  4.8   Hemoglobin 12.0 - 15.0 g/dL 11.7  13.0  12.5   Hematocrit 36.0 - 46.0 % 36.1  40.0  36.4   Platelets 150 - 400 K/uL 285  306  210     Lab Results  Component Value Date   HGBA1C 4.9 08/13/2016   Lab Results  Component Value Date   TSH 3.040 05/08/2018    ================================================== I spent a total of 31 minutes with the patient spent in direct patient consultation.  Additional time spent with chart review  / charting (studies, outside notes, etc): 14 min Total Time: 45 none min  Current medicines are reviewed at length with the patient today.  (+/- concerns) N/A  Notice: This dictation was prepared with Dragon dictation along with smart phrase technology. Any transcriptional errors that result from this process are unintentional and may not be corrected upon review.  Studies Ordered:   No orders of the defined types were placed in this encounter.  No orders of the defined types were placed in this encounter.   Patient Instructions / Medication Changes & Studies & Tests Ordered   Patient Instructions  Medication Instructions:     Do Not take the Zetia ( Ezetimibe )now , depending on your upcoming labs in March , if LDL is still around the 70 mark ,may want you to start taking medication the goal is to have LDL @ 50   *If you need a refill on your cardiac medications before your next appointment, please call your pharmacy*   Lab Work: Not needed   Testing/Procedures: Not needed   Follow-Up: At [facility], you and your health needs are our priority.  As part of our continuing mission to provide you with exceptional heart care, we have created designated Provider Care Teams.  These Care Teams include your primary Cardiologist (physician) and Advanced Practice Providers (APPs -  Physician Assistants and Nurse Practitioners) who all work together to provide you with the care you need, when you need it.     Your next appointment:   12 month(s)  The format for your next appointment:   In Person  Provider:   Glenetta Hew, MD         Leonie Man, MD, MS Glenetta Hew, M.D., M.S. Interventional Cardiologist  [place]  Pager # [phone] Phone # [phone] [address]. [place], [place] [address]   Thank you for choosing [place] at [place]!!

## 2022-05-18 ENCOUNTER — Encounter: Payer: Self-pay | Admitting: Cardiology

## 2022-05-18 ENCOUNTER — Ambulatory Visit: Payer: Medicare PPO | Attending: Cardiology | Admitting: Cardiology

## 2022-05-18 VITALS — BP 132/72 | HR 61 | Ht 67.0 in | Wt 160.0 lb

## 2022-05-18 DIAGNOSIS — I10 Essential (primary) hypertension: Secondary | ICD-10-CM

## 2022-05-18 DIAGNOSIS — I2119 ST elevation (STEMI) myocardial infarction involving other coronary artery of inferior wall: Secondary | ICD-10-CM | POA: Diagnosis not present

## 2022-05-18 DIAGNOSIS — M791 Myalgia, unspecified site: Secondary | ICD-10-CM | POA: Diagnosis not present

## 2022-05-18 DIAGNOSIS — T466X5A Adverse effect of antihyperlipidemic and antiarteriosclerotic drugs, initial encounter: Secondary | ICD-10-CM

## 2022-05-18 DIAGNOSIS — E785 Hyperlipidemia, unspecified: Secondary | ICD-10-CM

## 2022-05-18 DIAGNOSIS — I2510 Atherosclerotic heart disease of native coronary artery without angina pectoris: Secondary | ICD-10-CM | POA: Diagnosis not present

## 2022-05-18 DIAGNOSIS — Z9861 Coronary angioplasty status: Secondary | ICD-10-CM

## 2022-05-18 NOTE — Patient Instructions (Addendum)
Medication Instructions:     Do Not take the Zetia ( Ezetimibe )now , depending on your upcoming labs in March , if LDL is still around the 70 mark ,may want you to start taking medication the goal is to have LDL @ 50   *If you need a refill on your cardiac medications before your next appointment, please call your pharmacy*   Lab Work: Not needed   Testing/Procedures: Not needed   Follow-Up: At [facility], you and your health needs are our priority.  As part of our continuing mission to provide you with exceptional heart care, we have created designated Provider Care Teams.  These Care Teams include your primary Cardiologist (physician) and Advanced Practice Providers (APPs -  Physician Assistants and Nurse Practitioners) who all work together to provide you with the care you need, when you need it.     Your next appointment:   12 month(s)  The format for your next appointment:   In Person  Provider:   Glenetta Hew, MD

## 2022-05-28 ENCOUNTER — Other Ambulatory Visit (HOSPITAL_COMMUNITY): Payer: Self-pay

## 2022-05-28 ENCOUNTER — Telehealth: Payer: Self-pay | Admitting: Cardiology

## 2022-05-28 ENCOUNTER — Telehealth: Payer: Self-pay

## 2022-05-28 ENCOUNTER — Encounter: Payer: Self-pay | Admitting: Cardiology

## 2022-05-28 NOTE — Assessment & Plan Note (Signed)
Stable BP on current meds.  No change to current doses of benazepril 10 mg daily and Lopressor 25 mg twice daily.

## 2022-05-28 NOTE — Telephone Encounter (Signed)
called the patients insurance plan and they have verified the authorization for this good from dates 1.1.22 through 12.31.24. AUTH [id number]. Case # being [id number]   Contact number for this plan [phone]

## 2022-05-28 NOTE — Assessment & Plan Note (Signed)
No recurrent anginal symptoms since her MI.  Doing well.  Open to get back to her routine level of exercise and activity once she recovers from her surgery.  BP is stable.  Lipids are borderline.  Plan:  Continue aspirin without Plavix, okay to hold 5 days preop On moderate dose Lopressor and benazepril with well-controlled blood pressures. Tolerating Repatha, last lipids were not quite at goal.  She does have a prescription for Zetia which we will have her hold onto until her labs are rechecked.  If LDL not at goal, she will then start the 10 mg.

## 2022-05-28 NOTE — Assessment & Plan Note (Signed)
Did not do well with statins.  Was chronic issue.  Started on Repatha years ago.  Initially LDL was really well-controlled and she has had some setbacks.  May require the addition of Zetia.

## 2022-05-28 NOTE — Telephone Encounter (Signed)
Pt c/o medication issue:  1. Name of Medication:   Evolocumab (REPATHA SURECLICK) [id number] MG/ML SOAJ    2. How are you currently taking this medication (dosage and times per day)?   3. Are you having a reaction (difficulty breathing--STAT)?   4. What is your medication issue? Pt is needing prior auth called in for this medication.

## 2022-05-28 NOTE — Assessment & Plan Note (Signed)
Has not tolerated statins in the past because of leg pain.  Is now on Repatha with borderline lipid control.  Currently waiting for follow-up labs to see if she should start Zetia or not.  If not at goal of LDL less than 70, would start 10 mg Zetia.  She has prescription.  Labs due in March.  Continue healthy diet.

## 2022-05-28 NOTE — Assessment & Plan Note (Signed)
Just over 11 years out from inferior MI with occluded RCA.  After initially having recurrent pain following initial PCI, has not had any further symptoms.  Stable EF with no active CHF symptoms.

## 2022-05-28 NOTE — Telephone Encounter (Signed)
Patient informed about PA end date.

## 2022-05-31 NOTE — Discharge Summary (Signed)
Patient ID: Jessica Landry MRN: [id number] DOB/AGE: 1955/04/25 67 y.o.  Admit date: 05/10/2022 Discharge date: 05/11/2022  Admission Diagnoses:  Principal Problem:   Radiculopathy, lumbar region   Discharge Diagnoses:  Same  Past Medical History:  Diagnosis Date   Anxiety    Arthritis    knees & back    CAD S/P percutaneous coronary angioplasty 03/07/2011   a) PCI of mRCA with Promus Element DES 3.5 mm x 28 mm (4.2 -- 3.7 mm); b) Myoview 04/2011: EF 81% (small LV Cavity), fixed basal-mid Inferior Infarct, No Ischemia, 10 METS   Complication of anesthesia    Depression 03/08/2011   Dr. Milus Banister- [place], [facility]   Essential hypertension 03/07/2011   Family history of adverse reaction to anesthesia    GERD (gastroesophageal reflux disease)    Headache    chocolate & red wine triggers /w bad headaches    History of back surgery    Hyperlipidemia with target LDL less than 70 2012    Statin Intolerance (tried Lipitor & Crestor)   Insomnia    without significant sleeping disorders   Iron deficiency anemia 03/09/2011   Neuropathy    PONV (postoperative nausea and vomiting)    ST-segment elevation myocardial infarction (STEMI) of inferior wall ([place]) 03/07/2011   RCA Occlusion --> PCI of mRCA; ECHO 03/2011: EF > 55%, MIld Inferior HK, Mild AoV Sclerosis.   Vocal cord polyp 2005    Surgeries: Procedure(s): LEFT-SIDED LUMBAR THREE - LUMBAR FOUR TRANSFORAMINAL LUMBAR INTERBODY FUSION AND DECOMPRESSION WITH INSTRUMENTATION AND ALLOGRAFT on 05/10/2022   Consultants: None  Discharged Condition: Improved  Hospital Course: Jessica Landry is an 67 y.o. female who was admitted 05/10/2022 for operative treatment of Radiculopathy, lumbar region. Patient has severe unremitting pain that affects sleep, daily activities, and work/hobbies. After pre-op clearance the patient was taken to the operating room on 05/10/2022 and underwent  Procedure(s): LEFT-SIDED LUMBAR THREE - LUMBAR FOUR  TRANSFORAMINAL LUMBAR INTERBODY FUSION AND DECOMPRESSION WITH INSTRUMENTATION AND ALLOGRAFT.    Patient was given perioperative antibiotics:  Anti-infectives (From admission, onward)    Start     Dose/Rate Route Frequency Ordered Stop   05/10/22 1300  ceFAZolin (ANCEF) IVPB 2g/100 mL premix        2 g 200 mL/hr over 30 Minutes Intravenous Every 8 hours 05/10/22 1248 05/11/22 0500   05/10/22 0615  ceFAZolin (ANCEF) IVPB 2g/100 mL premix        2 g 200 mL/hr over 30 Minutes Intravenous On call to O.R. 05/10/22 0602 05/10/22 [id number]        Patient was given sequential compression devices, early ambulation to prevent DVT.  Patient benefited maximally from hospital stay and there were no complications.    Recent vital signs: BP (!) 103/56 (BP Location: Right Arm)   Pulse 74   Temp 99.9 F (37.7 C) (Oral)   Resp 16   Ht 5' 6"$  (1.676 m)   Wt 72.6 kg   SpO2 99%   BMI 25.82 kg/m    Discharge Medications:   Allergies as of 05/11/2022       Reactions   Bee Venom Anaphylaxis   Statins Other (See Comments)   Myalgias & fatigue   Etodolac    Prednisone Palpitations, Other (See Comments)   Can take small amounts. HR increase; increased energy   Sulfa Antibiotics Other (See Comments)   Elevated B/P, skin turns red        Medication List     STOP  taking these medications    aspirin EC 81 MG tablet   tiZANidine 4 MG tablet Commonly known as: ZANAFLEX       TAKE these medications    acetaminophen 500 MG tablet Commonly known as: TYLENOL Take 500-1,000 mg by mouth every 6 (six) hours as needed (pain.).   benazepril 10 MG tablet Commonly known as: LOTENSIN Take 1 tablet (10 mg total) by mouth every morning.   buPROPion 300 MG 24 hr tablet Commonly known as: WELLBUTRIN XL TAKE 1 TABLET EVERY MORNING   CYANOCOBALAMIN PO Take 1 tablet by mouth daily.   FLUoxetine 40 MG capsule Commonly known as: PROZAC TAKE 1 CAPSULE EVERY DAY BEFORE BREAKFAST  WITH  10MG  CAP    FLUoxetine 10 MG capsule Commonly known as: PROZAC TAKE 1 CAPSULE EVERY DAY  WITH  40MG   fluticasone 50 MCG/ACT nasal spray Commonly known as: FLONASE Place into the nose.   gabapentin 300 MG capsule Commonly known as: NEURONTIN Take 2 capsules (600 mg total) by mouth at bedtime.   methocarbamol 500 MG tablet Commonly known as: ROBAXIN Take 1-2 tablets (500-1,000 mg total) by mouth every 6 (six) hours as needed for muscle spasms.   metoprolol tartrate 50 MG tablet Commonly known as: LOPRESSOR TAKE 1 TABLET TWICE DAILY   mupirocin ointment 2 % Commonly known as: BACTROBAN Apply topically 3 (three) times daily.   nitroGLYCERIN 0.4 MG SL tablet Commonly known as: NITROSTAT Place 1 tablet (0.4 mg total) under the tongue every 5 (five) minutes as needed for chest pain (up to 3 tabs in 15 mins and then call 911).   omeprazole 20 MG capsule Commonly known as: PRILOSEC TAKE 1 CAPSULE(20 MG) BY MOUTH DAILY   oxyCODONE-acetaminophen 5-325 MG tablet Commonly known as: PERCOCET/ROXICET Take 1-2 tablets by mouth every 4 (four) hours as needed for severe pain.   Repatha SureClick [id number] MG/ML Soaj Generic drug: Evolocumab ADMINISTER 140MG(1 ML) UNDER THE SKIN EVERY 2 WEEKS        Diagnostic Studies: DG Lumbar Spine 2-3 Views  Result Date: 05/10/2022 CLINICAL DATA:  67 year old female undergoing lumbar surgery. EXAM: LUMBAR SPINE - 2-3 VIEW COMPARISON:  Intraoperative cross-table lateral view at 0814 hours today. FINDINGS: Two intraoperative fluoroscopic AP and lateral spot views of the lumbar spine. Segmentation appeared normal on the comparison cross-table lateral this morning. These images demonstrate L3-L4 posterior and interbody fusion hardware in place. No adverse features identified. Some underlying lumbar scoliosis. Fluoroscopy time: 0 minutes 33 seconds.  21.12 mGy IMPRESSION: L3-L4 posterior and interbody fusion hardware in place. Electronically Signed   By: Genevie Ann M.D.   On:  05/10/2022 10:19   DG C-Arm 1-60 Min-No Report  Result Date: 05/10/2022 Fluoroscopy was utilized by the requesting physician.  No radiographic interpretation.   DG C-Arm 1-60 Min-No Report  Result Date: 05/10/2022 Fluoroscopy was utilized by the requesting physician.  No radiographic interpretation.   DG Lumbar Spine 1 View  Result Date: 05/10/2022 CLINICAL DATA:  [id number] Elective surgery [id number] EXAM: LUMBAR SPINE - 1 VIEW COMPARISON:  May 01, 2021 FINDINGS: Single cross-table radiograph is obtained. This demonstrates straightening of the lumbar lordosis. Skin markers overlie the levels of L2-3 and L1-2. Multilevel vertebral disc space height loss. Severe atherosclerotic calcifications. IMPRESSION: Single cross-table radiograph obtained. Skin markers overlie the levels of L2-3 and L1-2. Electronically Signed   By: Valentino Saxon M.D.   On: 05/10/2022 09:29    Disposition: Discharge disposition: 01-Home or Self Care  Discharge Instructions     Discharge patient   Complete by: As directed    Discharge disposition: 01-Home or Self Care   Discharge patient date: 05/11/2022      POD #1 s/p L3-4 TLIF with post instrumentation for BIL leg pain   - Doing well, resolved leg pain, expected PO LBP - up with PT/OT, encourage ambulation - Percocet for pain, Robaxin for muscle spasms -Scripts for pain sent to pharmacy electronically  -D/C instructions sheet printed and in chart -D/C today  -F/U in office 2 weeks   Signed: Lennie Muckle Askia Hazelip 05/31/2022, 11:23 AM

## 2022-06-11 ENCOUNTER — Other Ambulatory Visit: Payer: Self-pay | Admitting: Cardiology

## 2022-07-04 ENCOUNTER — Other Ambulatory Visit: Payer: Self-pay | Admitting: Internal Medicine

## 2022-07-04 DIAGNOSIS — Z1231 Encounter for screening mammogram for malignant neoplasm of breast: Secondary | ICD-10-CM

## 2022-11-07 ENCOUNTER — Emergency Department
Admission: EM | Admit: 2022-11-07 | Discharge: 2022-11-07 | Disposition: A | Discharge status: Home / Self Care | Payer: Medicare PPO | Attending: Emergency Medicine | Admitting: Emergency Medicine

## 2022-11-07 ENCOUNTER — Other Ambulatory Visit: Payer: Self-pay

## 2022-11-07 ENCOUNTER — Encounter: Payer: Self-pay | Admitting: Emergency Medicine

## 2022-11-07 ENCOUNTER — Emergency Department: Payer: Medicare PPO

## 2022-11-07 DIAGNOSIS — R0789 Other chest pain: Secondary | ICD-10-CM | POA: Diagnosis present

## 2022-11-07 DIAGNOSIS — M79622 Pain in left upper arm: Secondary | ICD-10-CM | POA: Diagnosis not present

## 2022-11-07 LAB — COMPREHENSIVE METABOLIC PANEL
ALT: 13 U/L (ref 0–44)
AST: 16 U/L (ref 15–41)
Albumin: 3.5 g/dL (ref 3.5–5.0)
Alkaline Phosphatase: 63 U/L (ref 38–126)
Anion gap: 8 (ref 5–15)
BUN: 20 mg/dL (ref 8–23)
CO2: 26 mmol/L (ref 22–32)
Calcium: 8.9 mg/dL (ref 8.9–10.3)
Chloride: 102 mmol/L (ref 98–111)
Creatinine, Ser: 0.96 mg/dL (ref 0.44–1.00)
GFR, Estimated: >60 mL/min (ref >60)
Glucose, Bld: 106 mg/dL — ABNORMAL HIGH (ref 70–99)
Potassium: 3.5 mmol/L (ref 3.5–5.1)
Sodium: 136 mmol/L (ref 135–145)
Total Bilirubin: 0.5 mg/dL (ref 0.3–1.2)
Total Protein: 6.6 g/dL (ref 6.5–8.1)

## 2022-11-07 LAB — CBC WITH DIFFERENTIAL/PLATELET
Abs Immature Granulocytes: 0.03 10*3/uL (ref 0.00–0.07)
Basophils Absolute: 0.1 10*3/uL (ref 0.0–0.1)
Basophils Relative: 1 %
Eosinophils Absolute: 0.2 10*3/uL (ref 0.0–0.5)
Eosinophils Relative: 3 %
HCT: 34.2 % — ABNORMAL LOW (ref 36.0–46.0)
Hemoglobin: 10.8 g/dL — ABNORMAL LOW (ref 12.0–15.0)
Immature Granulocytes: 1 %
Lymphocytes Relative: 29 %
Lymphs Abs: 1.7 10*3/uL (ref 0.7–4.0)
MCH: 30.2 pg (ref 26.0–34.0)
MCHC: 31.6 g/dL (ref 30.0–36.0)
MCV: 95.5 fL (ref 80.0–100.0)
Monocytes Absolute: 0.5 10*3/uL (ref 0.1–1.0)
Monocytes Relative: 9 %
Neutro Abs: 3.4 10*3/uL (ref 1.7–7.7)
Neutrophils Relative %: 57 %
Platelets: 254 10*3/uL (ref 150–400)
RBC: 3.58 MIL/uL — ABNORMAL LOW (ref 3.87–5.11)
RDW: 12.9 % (ref 11.5–15.5)
WBC: 5.9 10*3/uL (ref 4.0–10.5)
nRBC: 0 % (ref 0.0–0.2)

## 2022-11-07 LAB — TROPONIN I (HIGH SENSITIVITY)
Troponin I (High Sensitivity): 4 ng/L (ref <18)
Troponin I (High Sensitivity): 4 ng/L (ref <18)

## 2022-11-07 MED ORDER — NITROGLYCERIN 0.4 MG SL SUBL: 0.4000 mg | SUBLINGUAL_TABLET | SUBLINGUAL | 1 refills | Status: AC | PRN

## 2022-11-07 NOTE — Discharge Instructions (Signed)
You have been seen in the Emergency Department (ED) today for chest pain.  As we have discussed today's test results are normal, and we believe your pain is due to pain/strain and/or inflammation of the muscles and/or cartilage of your chest wall.  We recommend you take ibuprofen 600 mg three times a day with meals for the next 5 days (unless you have been told previously not to take ibuprofen or NSAIDs in general).  You may also take Tylenol according to the label instructions.  Read through the included information for additional treatment recommendations and precautions.  Continue to take your regular medications.   Return to the Emergency Department (ED) if you experience any further chest pain/pressure/tightness, difficulty breathing, or sudden sweating, or other symptoms that concern you.  

## 2022-11-07 NOTE — ED Provider Notes (Signed)
[facility] Provider Note    Event Date/Time   First MD Initiated Contact with Patient 11/07/22 0330     (approximate)   History   Chest Pain   HPI Jessica Landry is a 67 y.o. female with a prior cardiac history including a prior MI about 12 years ago.  She presents for evaluation of acute onset pain in her left axilla.  She said that she had a normal day with no known injuries and was getting ready for bed when she developed some pain first in the left posterior part of her breast but then it was in her left armpit.  She was concerned because this is not normal for her and she cannot think of a reason that she would be having pain.  She wanted to get checked out to make sure she was not having a heart attack.  She has had no shortness of breath, nausea, vomiting, abdominal pain, nor recent fever or other illness.  She said that she could have pulled a muscle or injured herself but she cannot remember doing so.  The pain is little bit worse if she moves her arm and shoulder around in certain positions.  She had a exercise stress test with Dr. Herbie Baltimore her cardiologist about 2 years ago which was reassuring.  She has been compliant with her medication regimen.     Physical Exam   Triage Vital Signs: ED Triage Vitals  Encounter Vitals Group     BP 11/07/22 0238 122/63     Systolic BP Percentile --      Diastolic BP Percentile --      Pulse Rate 11/07/22 0238 63     Resp 11/07/22 0238 14     Temp 11/07/22 0238 97.7 F (36.5 C)     Temp Source 11/07/22 0238 Oral     SpO2 11/07/22 0238 97 %     Weight 11/07/22 0229 68 kg (150 lb)     Height 11/07/22 0229 1.676 m (5\' 6" )     Head Circumference --      Peak Flow --      Pain Score 11/07/22 0229 7     Pain Loc --      Pain Education --      Exclude from Growth Chart --     Most recent vital signs: Vitals:   11/07/22 0238  BP: 122/63  Pulse: 63  Resp: 14  Temp: 97.7 F (36.5 C)  SpO2: 97%     General: Awake, no distress.  CV:  Good peripheral perfusion.  Normal heart sounds, regular rate and rhythm.  Normal heart sounds. Resp:  Normal effort. Speaking easily and comfortably, no accessory muscle usage nor intercostal retractions.  Lungs clear to auscultation bilaterally. Abd:  No distention.  No tenderness to palpation. MSK:  Patient has full range of motion of left shoulder but she has reproducible tenderness to palpation just inferior to the left axilla and with some amount of abduction to the left shoulder.  No palpable or visible deformities of the joint. Other:  Appropriate mood and affect, pleasant and conversant, no obvious distress.   ED Results / Procedures / Treatments   Labs (all labs ordered are listed, but only abnormal results are displayed) Labs Reviewed  CBC WITH DIFFERENTIAL/PLATELET - Abnormal; Notable for the following components:      Result Value   RBC 3.58 (*)    Hemoglobin 10.8 (*)    HCT 34.2 (*)  All other components within normal limits  COMPREHENSIVE METABOLIC PANEL - Abnormal; Notable for the following components:   Glucose, Bld 106 (*)    All other components within normal limits  TROPONIN I (HIGH SENSITIVITY)  TROPONIN I (HIGH SENSITIVITY)     EKG  ED ECG REPORT I, Loleta Rose, the attending physician, personally viewed and interpreted this ECG.  Date: 11/07/2022 EKG Time: 2:33 AM Rate: 63 Rhythm: normal sinus rhythm QRS Axis: normal Intervals: normal ST/T Wave abnormalities: normal Narrative Interpretation: no evidence of acute ischemia   RADIOLOGY I viewed and interpreted the patient's two-view chest x-ray.  I see no evidence of pneumonia, pneumothorax, nor other acute or emergent abnormality.  I also read the radiologist's report, which confirmed no acute findings.   PROCEDURES:  Critical Care performed: No  Procedures    IMPRESSION / MDM / ASSESSMENT AND PLAN / ED COURSE  I reviewed the triage vital signs and  the nursing notes.                              Differential diagnosis includes, but is not limited to, musculoskeletal strain, ACS, pneumonia, PE.  Patient's presentation is most consistent with acute presentation with potential threat to life or bodily function.  Labs/studies ordered: High-sensitivity troponin x 2, EKG, CMP, CBC with differential, two-view chest x-ray  Interventions/Medications given:  Medications - No data to display  (Note:  hospital course my include additional interventions and/or labs/studies not listed above.)   Vital signs stable and within normal limits, workup is all reassuring including no EKG changes suggestive of ischemia and 2 negative high-sensitivity troponins.  The rest of her labs are also reassuring and chest x-ray is clear.  Given the reproducible nature of her discomfort, I think this is most likely musculoskeletal.  She feels better knowing about the reassuring workup and is comfortable following up with Dr. Herbie Baltimore.  I gave my usual and customary return precautions.  Initially I considered hospitalization given her cardiac history, but given the 2 negative troponins and normal EKG and reproducible nature of the pain, I think it is very reasonable to follow-up as an outpatient.  She agrees.        FINAL CLINICAL IMPRESSION(S) / ED DIAGNOSES   Final diagnoses:  Atypical chest pain  Left axillary pain     Rx / DC Orders   ED Discharge Orders          Ordered    Ambulatory referral to Cardiology       Comments: If you have not heard from the Cardiology office within the next 72 hours please call [phone].   11/07/22 0452    nitroGLYCERIN (NITROSTAT) 0.4 MG SL tablet  Every 5 min PRN        11/07/22 0518             Note:  This document was prepared using Dragon voice recognition software and may include unintentional dictation errors.   Loleta Rose, MD 11/07/22 [phone]

## 2022-11-07 NOTE — ED Notes (Signed)
Pt brought to rm 40 in flex. Call light within reach.

## 2022-11-07 NOTE — ED Triage Notes (Signed)
Patient ambulatory to triage with steady gait, without difficulty or distress noted; pt reports since 11pm having left sided CP radiating into axillae; no accomp symptoms; 4-81mg  ASA and 1 NTG PTA

## 2023-03-14 ENCOUNTER — Other Ambulatory Visit: Payer: Self-pay | Admitting: Orthopedic Surgery

## 2023-03-14 DIAGNOSIS — M5416 Radiculopathy, lumbar region: Secondary | ICD-10-CM

## 2023-03-19 ENCOUNTER — Ambulatory Visit
Admission: RE | Admit: 2023-03-19 | Discharge: 2023-03-19 | Disposition: A | Discharge status: Home / Self Care | Payer: Medicare PPO | Source: Ambulatory Visit | Attending: Orthopedic Surgery | Admitting: Orthopedic Surgery

## 2023-03-19 DIAGNOSIS — M5416 Radiculopathy, lumbar region: Secondary | ICD-10-CM

## 2023-03-27 ENCOUNTER — Other Ambulatory Visit: Payer: Self-pay | Admitting: Orthopedic Surgery

## 2023-03-28 NOTE — Progress Notes (Signed)
Surgical Instructions   Your procedure is scheduled on April 11, 2023. Report to [facility] Main Entrance "A" at 5:30 A.M., then check in with the Admitting office. Any questions or running late day of surgery: call [phone]  Questions prior to your surgery date: call [phone], Monday-Friday, 8am-4pm. If you experience any cold or flu symptoms such as cough, fever, chills, shortness of breath, etc. between now and your scheduled surgery, please notify us at the above number.     Remember:  Do not eat after midnight the night before your surgery  You may drink clear liquids until 4:30 the morning of your surgery.   Clear liquids allowed are: Water, Non-Citrus Juices (without pulp), Carbonated Beverages, Clear Tea (no milk, honey, etc.), Black Coffee Only (NO MILK, CREAM OR POWDERED CREAMER of any kind), and Gatorade.    Take these medicines the morning of surgery with A SIP OF WATER  buPROPion (WELLBUTRIN XL)  doxycycline (VIBRA-TABS)  FLUoxetine (PROZAC)  gabapentin (NEURONTIN)  metoprolol tartrate (LOPRESSOR)  omeprazole (PRILOSEC)    May take these medicines IF NEEDED: acetaminophen (TYLENOL)  methocarbamol (ROBAXIN)  nitroGLYCERIN (NITROSTAT)     One week prior to surgery, STOP taking any Aspirin (unless otherwise instructed by your surgeon) Aleve, Naproxen, Ibuprofen, Motrin, Advil, Goody's, BC's, all herbal medications, fish oil, and non-prescription vitamins.                     Do NOT Smoke (Tobacco/Vaping) for 24 hours prior to your procedure.  If you use a CPAP at night, you may bring your mask/headgear for your overnight stay.   You will be asked to remove any contacts, glasses, piercing's, hearing aid's, dentures/partials prior to surgery. Please bring cases for these items if needed.    Patients discharged the day of surgery will not be allowed to drive home, and someone needs to stay with them for 24 hours.  SURGICAL WAITING ROOM VISITATION Patients  may have no more than 2 support people in the waiting area - these visitors may rotate.   Pre-op nurse will coordinate an appropriate time for 1 ADULT support person, who may not rotate, to accompany patient in pre-op.  Children under the age of 74 must have an adult with them who is not the patient and must remain in the main waiting area with an adult.  If the patient needs to stay at the hospital during part of their recovery, the visitor guidelines for inpatient rooms apply.  Please refer to the [facility] website for the visitor guidelines for any additional information.   If you received a COVID test during your pre-op visit  it is requested that you wear a mask when out in public, stay away from anyone that may not be feeling well and notify your surgeon if you develop symptoms. If you have been in contact with anyone that has tested positive in the last 10 days please notify you surgeon.      Pre-operative 5 CHG Bathing Instructions   You can play a key role in reducing the risk of infection after surgery. Your skin needs to be as free of germs as possible. You can reduce the number of germs on your skin by washing with CHG (chlorhexidine gluconate) soap before surgery. CHG is an antiseptic soap that kills germs and continues to kill germs even after washing.   DO NOT use if you have an allergy to chlorhexidine/CHG or antibacterial soaps. If your skin becomes reddened or irritated,  stop using the CHG and notify one of our RNs at [phone].   Please shower with the CHG soap starting 4 days before surgery using the following schedule:     Please keep in mind the following:  DO NOT shave, including legs and underarms, starting the day of your first shower.   You may shave your face at any point before/day of surgery.  Place clean sheets on your bed the day you start using CHG soap. Use a clean washcloth (not used since being washed) for each shower. DO NOT sleep with pets once  you start using the CHG.   CHG Shower Instructions:  Wash your face and private area with normal soap. If you choose to wash your hair, wash first with your normal shampoo.  After you use shampoo/soap, rinse your hair and body thoroughly to remove shampoo/soap residue.  Turn the water OFF and apply about 3 tablespoons (45 ml) of CHG soap to a CLEAN washcloth.  Apply CHG soap ONLY FROM YOUR NECK DOWN TO YOUR TOES (washing for 3-5 minutes)  DO NOT use CHG soap on face, private areas, open wounds, or sores.  Pay special attention to the area where your surgery is being performed.  If you are having back surgery, having someone wash your back for you may be helpful. Wait 2 minutes after CHG soap is applied, then you may rinse off the CHG soap.  Pat dry with a clean towel  Put on clean clothes/pajamas   If you choose to wear lotion, please use ONLY the CHG-compatible lotions on the back of this paper.   Additional instructions for the day of surgery: DO NOT APPLY any lotions, deodorants, cologne, or perfumes.   Do not bring valuables to the hospital. [facility] is not responsible for any belongings/valuables. Do not wear nail polish, gel polish, artificial nails, or any other type of covering on natural nails (fingers and toes) Do not wear jewelry or makeup Put on clean/comfortable clothes.  Please brush your teeth.  Ask your nurse before applying any prescription medications to the skin.     CHG Compatible Lotions   Aveeno Moisturizing lotion  Cetaphil Moisturizing Cream  Cetaphil Moisturizing Lotion  Clairol Herbal Essence Moisturizing Lotion, Dry Skin  Clairol Herbal Essence Moisturizing Lotion, Extra Dry Skin  Clairol Herbal Essence Moisturizing Lotion, Normal Skin  Curel Age Defying Therapeutic Moisturizing Lotion with Alpha Hydroxy  Curel Extreme Care Body Lotion  Curel Soothing Hands Moisturizing Hand Lotion  Curel Therapeutic Moisturizing Cream, Fragrance-Free  Curel  Therapeutic Moisturizing Lotion, Fragrance-Free  Curel Therapeutic Moisturizing Lotion, Original Formula  Eucerin Daily Replenishing Lotion  Eucerin Dry Skin Therapy Plus Alpha Hydroxy Crme  Eucerin Dry Skin Therapy Plus Alpha Hydroxy Lotion  Eucerin Original Crme  Eucerin Original Lotion  Eucerin Plus Crme Eucerin Plus Lotion  Eucerin TriLipid Replenishing Lotion  Keri Anti-Bacterial Hand Lotion  Keri Deep Conditioning Original Lotion Dry Skin Formula Softly Scented  Keri Deep Conditioning Original Lotion, Fragrance Free Sensitive Skin Formula  Keri Lotion Fast Absorbing Fragrance Free Sensitive Skin Formula  Keri Lotion Fast Absorbing Softly Scented Dry Skin Formula  Keri Original Lotion  Keri Skin Renewal Lotion Keri Silky Smooth Lotion  Keri Silky Smooth Sensitive Skin Lotion  Nivea Body Creamy Conditioning Oil  Nivea Body Extra Enriched Teacher, adult education Moisturizing Lotion Nivea Crme  Nivea Skin Firming Lotion  NutraDerm 30 Skin Lotion  NutraDerm Skin Lotion  NutraDerm Therapeutic Skin Cream  NutraDerm Therapeutic Skin Lotion  ProShield Protective Hand Cream  Provon moisturizing lotion  Please read over the following fact sheets that you were given.

## 2023-03-29 ENCOUNTER — Encounter (HOSPITAL_COMMUNITY)
Admission: RE | Admit: 2023-03-29 | Discharge: 2023-03-29 | Disposition: A | Discharge status: Home / Self Care | Payer: Medicare PPO | Source: Ambulatory Visit | Attending: Orthopedic Surgery | Admitting: Orthopedic Surgery

## 2023-03-29 ENCOUNTER — Encounter (HOSPITAL_COMMUNITY): Payer: Self-pay

## 2023-03-29 ENCOUNTER — Other Ambulatory Visit: Payer: Self-pay

## 2023-03-29 VITALS — BP 127/74 | HR 57 | Temp 98.0°F | Resp 18 | Ht 67.0 in | Wt 154.0 lb

## 2023-03-29 DIAGNOSIS — K219 Gastro-esophageal reflux disease without esophagitis: Secondary | ICD-10-CM | POA: Diagnosis not present

## 2023-03-29 DIAGNOSIS — I10 Essential (primary) hypertension: Secondary | ICD-10-CM | POA: Diagnosis not present

## 2023-03-29 DIAGNOSIS — Z87891 Personal history of nicotine dependence: Secondary | ICD-10-CM | POA: Insufficient documentation

## 2023-03-29 DIAGNOSIS — I2510 Atherosclerotic heart disease of native coronary artery without angina pectoris: Secondary | ICD-10-CM | POA: Insufficient documentation

## 2023-03-29 DIAGNOSIS — E785 Hyperlipidemia, unspecified: Secondary | ICD-10-CM | POA: Insufficient documentation

## 2023-03-29 DIAGNOSIS — Z01818 Encounter for other preprocedural examination: Secondary | ICD-10-CM | POA: Diagnosis present

## 2023-03-29 DIAGNOSIS — I252 Old myocardial infarction: Secondary | ICD-10-CM | POA: Diagnosis not present

## 2023-03-29 DIAGNOSIS — M5416 Radiculopathy, lumbar region: Secondary | ICD-10-CM | POA: Diagnosis not present

## 2023-03-29 LAB — SURGICAL PCR SCREEN
MRSA, PCR: NEGATIVE
Staphylococcus aureus: NEGATIVE

## 2023-03-29 LAB — TYPE AND SCREEN
ABO/RH(D): A POS
Antibody Screen: NEGATIVE

## 2023-03-29 LAB — CBC
HCT: 36.3 % (ref 36.0–46.0)
Hemoglobin: 11.4 g/dL — ABNORMAL LOW (ref 12.0–15.0)
MCH: 29.2 pg (ref 26.0–34.0)
MCHC: 31.4 g/dL (ref 30.0–36.0)
MCV: 93.1 fL (ref 80.0–100.0)
Platelets: 294 10*3/uL (ref 150–400)
RBC: 3.9 MIL/uL (ref 3.87–5.11)
RDW: 14.1 % (ref 11.5–15.5)
WBC: 6.1 10*3/uL (ref 4.0–10.5)
nRBC: 0 % (ref 0.0–0.2)

## 2023-03-29 LAB — BASIC METABOLIC PANEL
Anion gap: 13 (ref 5–15)
BUN: 14 mg/dL (ref 8–23)
CO2: 26 mmol/L (ref 22–32)
Calcium: 9.4 mg/dL (ref 8.9–10.3)
Chloride: 100 mmol/L (ref 98–111)
Creatinine, Ser: 0.95 mg/dL (ref 0.44–1.00)
GFR, Estimated: >60 mL/min (ref >60)
Glucose, Bld: 91 mg/dL (ref 70–99)
Potassium: 4.2 mmol/L (ref 3.5–5.1)
Sodium: 139 mmol/L (ref 135–145)

## 2023-03-29 NOTE — Progress Notes (Signed)
PCP - Dr. Einar Crow Cardiologist - Dr. Bryan Lemma  PPM/ICD - Denies Device Orders - n/a Rep Notified - n/a  Chest x-ray - n/a EKG - 11-07-22 Stress Test - 08-14-16 ECHO - 04-22-20 Cardiac Cath - 2012  Sleep Study - denies CPAP - n  Fasting Blood Sugar - denies Checks Blood Sugar _____ times a day n/a  Last dose of GLP1 agonist-  denies GLP1 instructions: n/a  Blood Thinner Instructions:denies taking Aspirin Instructions: denies taking  ERAS Protcol - ERAS PRE-SURGERY Ensure or G2- N  COVID TEST- N   Anesthesia review: Y cardiac hx and patient states she has a difficulty waking up after anesthesia.  Patient denies shortness of breath, fever, cough and chest pain at PAT appointment. Patient denies any respiratory issues at this time.    All instructions explained to the patient, with a verbal understanding of the material. Patient agrees to go over the instructions while at home for a better understanding. Patient also instructed to self quarantine after being tested for COVID-19. The opportunity to ask questions was provided.

## 2023-04-01 ENCOUNTER — Encounter (HOSPITAL_COMMUNITY): Payer: Self-pay

## 2023-04-01 NOTE — Anesthesia Preprocedure Evaluation (Signed)
Anesthesia Evaluation    Airway        Dental   Pulmonary former smoker          Cardiovascular hypertension,      Neuro/Psych    GI/Hepatic   Endo/Other    Renal/GU      Musculoskeletal   Abdominal   Peds  Hematology   Anesthesia Other Findings   Reproductive/Obstetrics                             Anesthesia Physical Anesthesia Plan  ASA:   Anesthesia Plan:    Post-op Pain Management:    Induction:   PONV Risk Score and Plan:   Airway Management Planned:   Additional Equipment:   Intra-op Plan:   Post-operative Plan:   Informed Consent:   Plan Discussed with:   Anesthesia Plan Comments: (PAT note written 04/01/2023 by Shonna Chock, PA-C.  )       Anesthesia Quick Evaluation

## 2023-04-01 NOTE — Progress Notes (Signed)
Anesthesia Chart Review:  Case: [id number] Date/Time: 04/11/23 0715   Procedure: RIGHT-SIDED LUMBAR 4 - LUMBAR 5 TRANSFORAMINAL LUMBAR INTERBODY FUSION AND DECOMPRESSION WITH INSTRUMENTATION AND ALLOGRAFT (Right)   Anesthesia type: General   Pre-op diagnosis: LUMBAR RADICULOPATHY   [facility] / [facility]   Surgeons: Estill Bamberg, MD       DISCUSSION: Patient is a 67 year old female scheduled for the above procedure. She is s/p L3-4 fusion on 05/10/22.   History includes former smoker (quit 03/07/11), CAD (STEMI, s/p DES mRCA 03/07/11), HLD (statin intolerance), GERD, anemia, vocal cord polyp, hysterectomy, spinal surgery (L3-4 fusion 05/10/22), osteoarthritis (bilateral TKA 07/20/15), right axillary inclusion cyst (s/p excision 04/26/22). For anesthesia history, she reported prolonged emergence and post-operative N/V. She denied family history of malignant hyperthermia.   Last office visit with cardiologist Dr. Herbie Baltimore was on 05/18/22. She was doing well after back surgery and increasing activity. Rare palpitations. No chest pain, dizziness, syncope, or TIA symptoms. BP stable. She did not tolerate statin therapy, was was tolerating Repatha. Consider starting Zeta based on next LDL results. Continue medical therapy for CAD. Continue ASA without Plavix, "okay to hold 5 days preop." 12 month follow-up planned.  She says she is not currently taking ASA due to easily bruising.   She had a non-ischemic stress test in 2018. Overall benign event monitor in 04/2020. 04/22/20 echo showed LVEF 60-65%, no regional wall motion abnormalities, grade II diastolic dysfunction, normal RVSF, normal PASP, trivial MR, mild AI, ascending aorta 38 mm.   She had primary care evaluation on 01/04/23 with Lauro Regulus, MD for follow-up on chronic medical issues including CAD and also for preoperative evaluation for shoulder surgery. He wrote, "Exercise tolerance is fine and ok to proceed with shoulder surgery without  further testing."   I called and spoke with patient. She denied chest pain. She feels she is doing well from a cardiac standpoint. She denied any recent Nitroglycerin use. She said her back pain had resolved after her back surgery in February, but about 4 weeks ago noticed "sciatic" pain. She has since had follow-up with Dr. Yevette Edwards with the above surgery recommended.   I did ask her about why she is taking doxycyline. She reported multiple teeth implants (2 upper left, 2 lower left, 1 lower right). A few weeks ago she was having some pain along her left upper implants. She was evaluated by oral surgeon Lenon Curt, DDS, MD, FACS with [facility]. She reported he did imaging and was concerned there was an "air pocket." Two weeks ago she had a procedure to further evaluation the area. She says he did not note any infection and did no need to do bone grafting. He did advise 30 days of doxycycline as a "precaution."  She denied fever, drainage, or further tooth pain. I did advise that she let Dr. Marshell Levan office know if any concern of infection between now and surgery and to also contact Dr. Elissa Hefty office if any new symptoms between now and surgery. Currently, she denied any CV symptoms or signs of infection. I did leave Lupita Leash at Dr. Marshell Levan office a message regarding patient's course of doxycycline.   Anesthesia team to evaluate on the day of surgery.    VS: BP 127/74   Pulse (!) 57   Temp 36.7 C (Oral)   Resp 18   Ht 5\' 7"  (1.702 m)   Wt 69.9 kg   SpO2 100%   BMI 24.12  kg/m    PROVIDERS: Lauro Regulus, MD is PCP  Bryan Lemma, MD is cardiologist    LABS: Labs reviewed: Acceptable for surgery. (all labs ordered are listed, but only abnormal results are displayed)  Labs Reviewed  CBC - Abnormal; Notable for the following components:      Result Value   Hemoglobin 11.4 (*)    All other components within normal limits  SURGICAL  PCR SCREEN  BASIC METABOLIC PANEL  TYPE AND SCREEN    IMAGES: CT L-spine 03/19/23: Report in process.  CXR 11/07/22: FINDINGS: - The heart and mediastinal contours are unchanged. - No focal consolidation. No pulmonary edema. No pleural effusion. No pneumothorax. - No acute osseous abnormality. - Right upper quadrant surgical clips. IMPRESSION: No active cardiopulmonary disease.   EKG: 05/04/22: Sinus bradycardia at 59 bpm Nonspecific ST abnormality Abnormal ECG When compared with ECG of 13-Aug-2016 08:43, PREVIOUS ECG IS PRESENT No significant change since last tracing Confirmed by Nanetta Batty [phone]) on 05/04/2022 1:13:48 PM    CV: Echo 04/22/20:  1. Left ventricular ejection fraction, by estimation, is 60 to 65%. The  left ventricle has normal function. The left ventricle has no regional  wall motion abnormalities. Left ventricular diastolic parameters are  consistent with Grade II diastolic  dysfunction (pseudonormalization). The average left ventricular global  longitudinal strain is -25.2 %. The global longitudinal strain is normal.   2. Right ventricular systolic function is normal. The right ventricular  size is normal. There is normal pulmonary artery systolic pressure.   3. Left atrial size was mildly dilated.   4. The mitral valve is normal in structure. Trivial mitral valve  regurgitation. No evidence of mitral stenosis.   5. The aortic valve is tricuspid. Aortic valve regurgitation is mild. No  aortic stenosis is present.   6. Aortic dilatation noted. There is mild dilatation of the ascending  aorta, measuring 38 mm.   7. The inferior vena cava is normal in size with greater than 50%  respiratory variability, suggesting right atrial pressure of 3 mmHg.  - Comparison(s): Report only 03/27/11 EF >55%.      [name] Abd Aorta/Iliac 04/15/20: Summary:  Abdominal Aorta: No evidence of an abdominal aortic aneurysm was visualized. The largest aortic measurement is  2.3 cm.  Stenosis:  Mild aorta-iliac atherosclerosis, without evidence of significant  stenosis.  IVC/Iliac: There is no evidence of thrombus involving the IVC.      US Carotid 04/11/20: Summary:  - Right Carotid: Velocities in the right ICA are consistent with a 1-39% stenosis.  - Left Carotid: Velocities in the left ICA are consistent with a 1-39% stenosis.  - Vertebrals: Bilateral vertebral arteries demonstrate antegrade flow.  - Subclavians: Normal flow hemodynamics were seen in bilateral subclavian arteries.      Long term event monitor 04/07/20 - 05/06/20: Predominant rhythm was sinus rhythm-sinus bradycardia: Minimum heart rate 47 bpm, maximum heart rate 93 bpm. Overall average 59 bpm. No evidence of atrial fibrillation, or any other significant arrhythmia such as atrial flutter, SVT, VT. Rare PVCs and PACs. On occasions, the computer read 2 AVB-type II, likely just sinus bradycardia with monitor QT interval. 48 triggers noted. 16 were patient triggered. 32 auto trigger. Patient triggered events mostly with sinus bradycardia, occasionally with interpolated PVCs. Majority of symptoms were with sinus rhythm/artifact, no true arrhythmias noted. "Critical findings "; not were actually simply brought sinus bradycardia; not significant bradycardia.   - Overall relatively benign study despite multiple computer reads. Symptoms noted  mostly with sinus rhythm/sinus bradycardia, occasional PVCs. - Would not change management.    Nuclear stress test 08/14/16: Low risk study without evidence of ischemia or infarction. The left ventricular ejection fraction is hyperdynamic (>65%).     Cardiac cath 03/07/11:  1.  Inferior ST elevation MI, with a 100% mid RCA occlusion as the culprit lesion -reduced to 90% with wire passage. 2.  Successful percutaneous coronary intervention of the mid RCA with A Promus DES 2.5 mm x 28 mm (postdilated proximally to 4.1 2 mm, and distal to 3.75 mm). 3.   Preserved Left Ventricular Ejection Fraction - 55% with mild inferior hypokinesis.   Past Medical History:  Diagnosis Date   Anxiety    Arthritis    knees & back    CAD S/P percutaneous coronary angioplasty 03/07/2011   a) PCI of mRCA with Promus Element DES 3.5 mm x 28 mm (4.2 -- 3.7 mm); b) Myoview 04/2011: EF 81% (small LV Cavity), fixed basal-mid Inferior Infarct, No Ischemia, 10 METS   Complication of anesthesia    reported prolonged emergence   Depression 03/08/2011   Dr. Leory Plowman- [place], [facility]   Essential hypertension 03/07/2011   Family history of adverse reaction to anesthesia    GERD (gastroesophageal reflux disease)    Headache    chocolate & red wine triggers /w bad headaches    History of back surgery    Hyperlipidemia with target LDL less than 70 04/09/2010   Statin Intolerance (tried Lipitor & Crestor)   Insomnia    without significant sleeping disorders   Iron deficiency anemia 03/09/2011   Neuropathy    PONV (postoperative nausea and vomiting)    ST-segment elevation myocardial infarction (STEMI) of inferior wall (HCC) 03/07/2011   RCA Occlusion --> PCI of mRCA; ECHO 03/2011: EF > 55%, MIld Inferior HK, Mild AoV Sclerosis.   Vocal cord polyp 04/10/2003    Past Surgical History:  Procedure Laterality Date   ABDOMINAL HYSTERECTOMY     APPENDECTOMY  07/04/2000   Normal appendix, adhesions noted.   BACK SURGERY     cyst on spinal cord - lumbar   BREAST BIOPSY Right 01/31/1999   fibrocystic changes, ductal adenosis, focal atypical ductal epithelium.   BREAST EXCISIONAL BIOPSY     CHOLECYSTECTOMY  03/25/1998   Dr. Lemar Livings, chronic cholecystitis.   COLONOSCOPY WITH PROPOFOL N/A 09/28/2014   Procedure: COLONOSCOPY WITH PROPOFOL;  Surgeon: Earline Mayotte, MD;  Location: [facility] ENDOSCOPY;  Service: Endoscopy;  Laterality: N/A;   CORONARY ANGIOPLASTY WITH STENT PLACEMENT  03/07/2011   inferior STEMI - RCA PROMUS 3.5 mm x 28 mm DES  (post Dil 3.7 distal to 4.2  prox)   DENTAL SURGERY  2016   ESOPHAGOGASTRODUODENOSCOPY N/A 09/28/2014   Procedure: ESOPHAGOGASTRODUODENOSCOPY (EGD);  Surgeon: Earline Mayotte, MD;  Location: [facility] ENDOSCOPY;  Service: Endoscopy;  Laterality: N/A;   EYE SURGERY     blepheroplasty- bilateral   JOINT REPLACEMENT     KNEE SURGERY Left 2015   KNEE SURGERY Right 2010   Baker cyst   LEFT HEART CATH AND CORONARY ANGIOGRAPHY  03/08/2011   improved flow from the PCI   LEFT HEART CATHETERIZATION WITH CORONARY ANGIOGRAM N/A 03/07/2011   Procedure: LEFT HEART CATHETERIZATION WITH CORONARY ANGIOGRAM;  Surgeon: Marykay Lex, MD;  Location[facility] C[facility] LAB;  Service: Cardiovascular;  Laterality: N/A;   LEFT HEART CATHETERIZATION WITH CORONARY ANGIOGRAM N/A 03/08/2011   Procedure: LEFT HEART CATHETERIZATION WITH CORONARY ANGIOGRAM;  Surgeon: Marykay Lex, MD;  Location: [facility] CATH LAB;  Service: Cardiovascular;  Laterality: N/A;   NM MYOCAR PERF WALL MOTION  08/2016   Lexiscan: LOW RISK.  No ischemia or infarction.  Hyperdynamic LV function .  EF> 65%   skin nodule Left 09/11/1999   dermatofibroma left lower leg, positive lateral margin.   TOTAL KNEE ARTHROPLASTY Bilateral 07/20/2015   Procedure: TOTAL KNEE BILATERAL;  Surgeon: Loreta Ave, MD;  Location: [facility] OR;  Service: Orthopedics;  Laterality: Bilateral;   TRANSFORAMINAL LUMBAR INTERBODY FUSION (TLIF) WITH PEDICLE SCREW FIXATION 1 LEVEL Left 05/10/2022   Procedure: LEFT-SIDED LUMBAR THREE - LUMBAR FOUR TRANSFORAMINAL LUMBAR INTERBODY FUSION AND DECOMPRESSION WITH INSTRUMENTATION AND ALLOGRAFT;  Surgeon: Estill Bamberg, MD;  Location: [facility] OR;  Service: Orthopedics;  Laterality: Left;   TRANSTHORACIC ECHOCARDIOGRAM  03/27/2011   LV cavity is small,EF =>55%,MILD INFERIOR HYPOKINESIS; Mild Aortic Sclerosis   TRANSTHORACIC ECHOCARDIOGRAM  04/22/2020    EF 60-65%.  GRII DD.  No RWM A.  Normal RV.  Mild LA dilation.  (This is not consistent with GRII D), mild ascending aorta dilation of  38 mm.  Otherwise normal.   UPPER GI ENDOSCOPY  02/22/2003   Dr. Lemar Livings, normal    MEDICATIONS:  acetaminophen-codeine (TYLENOL #3) 300-30 MG tablet   benazepril (LOTENSIN) 10 MG tablet   buPROPion (WELLBUTRIN XL) 300 MG 24 hr tablet   doxycycline (VIBRA-TABS) 100 MG tablet   Evolocumab (REPATHA SURECLICK) 140 MG/ML SOAJ   FLUoxetine (PROZAC) 10 MG capsule   FLUoxetine (PROZAC) 40 MG capsule   gabapentin (NEURONTIN) 300 MG capsule   methocarbamol (ROBAXIN) 500 MG tablet   metoprolol tartrate (LOPRESSOR) 50 MG tablet   omeprazole (PRILOSEC) 20 MG capsule   acetaminophen (TYLENOL) 500 MG tablet   nitroGLYCERIN (NITROSTAT) 0.4 MG SL tablet   oxyCODONE-acetaminophen (PERCOCET/ROXICET) 5-325 MG tablet   No current facility-administered medications for this encounter.    Shonna Chock, PA-C Surgical Short Stay/Anesthesiology [employer] Phone [phone] [facility] Phone [phone] 04/01/2023 5:19 PM

## 2023-04-09 ENCOUNTER — Telehealth: Payer: Self-pay | Admitting: Cardiology

## 2023-04-09 ENCOUNTER — Other Ambulatory Visit: Payer: Self-pay | Admitting: Cardiology

## 2023-04-09 DIAGNOSIS — I2510 Atherosclerotic heart disease of native coronary artery without angina pectoris: Secondary | ICD-10-CM

## 2023-04-09 DIAGNOSIS — E785 Hyperlipidemia, unspecified: Secondary | ICD-10-CM

## 2023-04-09 DIAGNOSIS — E782 Mixed hyperlipidemia: Secondary | ICD-10-CM

## 2023-04-09 DIAGNOSIS — I2119 ST elevation (STEMI) myocardial infarction involving other coronary artery of inferior wall: Secondary | ICD-10-CM

## 2023-04-09 NOTE — Telephone Encounter (Signed)
 Pt c/o medication issue:  1. Name of Medication:  Evolocumab  (REPATHA  SURECLICK) 140 MG/ML SOAJ  2. How are you currently taking this medication (dosage and times per day)?   3. Are you having a reaction (difficulty breathing--STAT)?   4. What is your medication issue?   Patient states she has 1 more dose of Repatha  and she would like to discuss re-enrolling in grant. Please advise.

## 2023-04-11 ENCOUNTER — Ambulatory Visit (HOSPITAL_COMMUNITY)
Admission: RE | Disposition: A | Discharge status: Home / Self Care | Payer: Self-pay | Source: Ambulatory Visit | Attending: Orthopedic Surgery

## 2023-04-11 ENCOUNTER — Ambulatory Visit (HOSPITAL_COMMUNITY): Payer: Medicare HMO

## 2023-04-11 ENCOUNTER — Observation Stay (HOSPITAL_COMMUNITY)
Admission: RE | Admit: 2023-04-11 | Discharge: 2023-04-13 | Disposition: A | Discharge status: Home / Self Care | Payer: Medicare HMO | Source: Ambulatory Visit | Attending: Orthopedic Surgery | Admitting: Orthopedic Surgery

## 2023-04-11 ENCOUNTER — Ambulatory Visit (HOSPITAL_COMMUNITY): Payer: Medicare HMO | Admitting: Vascular Surgery

## 2023-04-11 ENCOUNTER — Other Ambulatory Visit: Payer: Self-pay

## 2023-04-11 ENCOUNTER — Encounter (HOSPITAL_COMMUNITY): Payer: Self-pay | Admitting: Orthopedic Surgery

## 2023-04-11 ENCOUNTER — Ambulatory Visit (HOSPITAL_BASED_OUTPATIENT_CLINIC_OR_DEPARTMENT_OTHER): Payer: Medicare HMO | Admitting: Anesthesiology

## 2023-04-11 DIAGNOSIS — Z955 Presence of coronary angioplasty implant and graft: Secondary | ICD-10-CM | POA: Insufficient documentation

## 2023-04-11 DIAGNOSIS — I2510 Atherosclerotic heart disease of native coronary artery without angina pectoris: Secondary | ICD-10-CM | POA: Diagnosis not present

## 2023-04-11 DIAGNOSIS — I10 Essential (primary) hypertension: Secondary | ICD-10-CM | POA: Insufficient documentation

## 2023-04-11 DIAGNOSIS — M48061 Spinal stenosis, lumbar region without neurogenic claudication: Secondary | ICD-10-CM

## 2023-04-11 DIAGNOSIS — M5416 Radiculopathy, lumbar region: Principal | ICD-10-CM | POA: Insufficient documentation

## 2023-04-11 DIAGNOSIS — Z87891 Personal history of nicotine dependence: Secondary | ICD-10-CM | POA: Diagnosis not present

## 2023-04-11 DIAGNOSIS — M5116 Intervertebral disc disorders with radiculopathy, lumbar region: Secondary | ICD-10-CM | POA: Diagnosis not present

## 2023-04-11 DIAGNOSIS — Z79899 Other long term (current) drug therapy: Secondary | ICD-10-CM | POA: Insufficient documentation

## 2023-04-11 DIAGNOSIS — M5410 Radiculopathy, site unspecified: Principal | ICD-10-CM | POA: Diagnosis present

## 2023-04-11 DIAGNOSIS — M960 Pseudarthrosis after fusion or arthrodesis: Secondary | ICD-10-CM | POA: Diagnosis not present

## 2023-04-11 DIAGNOSIS — Z96653 Presence of artificial knee joint, bilateral: Secondary | ICD-10-CM | POA: Diagnosis not present

## 2023-04-11 DIAGNOSIS — Z981 Arthrodesis status: Secondary | ICD-10-CM | POA: Diagnosis not present

## 2023-04-11 HISTORY — PX: TRANSFORAMINAL LUMBAR INTERBODY FUSION (TLIF) WITH PEDICLE SCREW FIXATION 1 LEVEL: SHX6141

## 2023-04-11 SURGERY — TRANSFORAMINAL LUMBAR INTERBODY FUSION (TLIF) WITH PEDICLE SCREW FIXATION 1 LEVEL
Anesthesia: General | Site: Spine Lumbar | Laterality: Right

## 2023-04-11 MED ORDER — HYDROMORPHONE HCL 1 MG/ML IJ SOLN: 0.2500 mg | INTRAMUSCULAR | Status: DC | PRN

## 2023-04-11 MED ORDER — ALUM & MAG HYDROXIDE-SIMETH 200-200-20 MG/5ML PO SUSP: 30.0000 mL | Freq: Four times a day (QID) | ORAL | Status: DC | PRN

## 2023-04-11 MED ORDER — BUPIVACAINE-EPINEPHRINE 0.25% -1:200000 IJ SOLN
INTRAMUSCULAR | Status: DC | PRN
  Administered 2023-04-11: 10 mL

## 2023-04-11 MED ORDER — SODIUM CHLORIDE 0.9 % IV SOLN: 250.0000 mL | INTRAVENOUS | Status: AC

## 2023-04-11 MED ORDER — HYDROMORPHONE HCL 1 MG/ML IJ SOLN
INTRAMUSCULAR | Status: AC
  Filled 2023-04-11: qty 1

## 2023-04-11 MED ORDER — SENNOSIDES-DOCUSATE SODIUM 8.6-50 MG PO TABS: 1.0000 | ORAL_TABLET | Freq: Every evening | ORAL | Status: DC | PRN

## 2023-04-11 MED ORDER — SODIUM CHLORIDE 0.9% FLUSH
3.0000 mL | Freq: Two times a day (BID) | INTRAVENOUS | Status: DC
  Administered 2023-04-11 – 2023-04-13 (×4): 3 mL via INTRAVENOUS

## 2023-04-11 MED ORDER — BUPROPION HCL ER (XL) 150 MG PO TB24
300.0000 mg | ORAL_TABLET | Freq: Every morning | ORAL | Status: DC
  Administered 2023-04-12 – 2023-04-13 (×2): 300 mg via ORAL
  Filled 2023-04-11: qty 2
  Filled 2023-04-11: qty 1
  Filled 2023-04-11: qty 2

## 2023-04-11 MED ORDER — PHENOL 1.4 % MT LIQD: 1.0000 | OROMUCOSAL | Status: DC | PRN

## 2023-04-11 MED ORDER — LACTATED RINGERS IV SOLN: INTRAVENOUS | Status: DC

## 2023-04-11 MED ORDER — BISACODYL 5 MG PO TBEC: 5.0000 mg | DELAYED_RELEASE_TABLET | Freq: Every day | ORAL | Status: DC | PRN

## 2023-04-11 MED ORDER — HYDROMORPHONE HCL 1 MG/ML IJ SOLN
0.2500 mg | INTRAMUSCULAR | Status: DC | PRN
  Administered 2023-04-11 (×2): 0.5 mg via INTRAVENOUS

## 2023-04-11 MED ORDER — METOPROLOL TARTRATE 50 MG PO TABS
50.0000 mg | ORAL_TABLET | Freq: Two times a day (BID) | ORAL | Status: DC
  Administered 2023-04-11 – 2023-04-13 (×2): 50 mg via ORAL
  Filled 2023-04-11 (×5): qty 1

## 2023-04-11 MED ORDER — FENTANYL CITRATE (PF) 250 MCG/5ML IJ SOLN
INTRAMUSCULAR | Status: AC
  Filled 2023-04-11: qty 5

## 2023-04-11 MED ORDER — MIDAZOLAM HCL 5 MG/5ML IJ SOLN
INTRAMUSCULAR | Status: DC | PRN
  Administered 2023-04-11: 1 mg via INTRAVENOUS

## 2023-04-11 MED ORDER — POVIDONE-IODINE 7.5 % EX SOLN
Freq: Once | CUTANEOUS | Status: DC
  Filled 2023-04-11: qty 118

## 2023-04-11 MED ORDER — FLUOXETINE HCL 20 MG PO CAPS
40.0000 mg | ORAL_CAPSULE | Freq: Every day | ORAL | Status: DC
  Administered 2023-04-12 – 2023-04-13 (×2): 40 mg via ORAL
  Filled 2023-04-11 (×3): qty 2

## 2023-04-11 MED ORDER — SODIUM CHLORIDE 0.9% FLUSH
3.0000 mL | Freq: Two times a day (BID) | INTRAVENOUS | Status: DC
  Administered 2023-04-11: 3 mL via INTRAVENOUS
  Administered 2023-04-12 – 2023-04-13 (×2): 10 mL via INTRAVENOUS

## 2023-04-11 MED ORDER — ONDANSETRON HCL 4 MG/2ML IJ SOLN: 4.0000 mg | Freq: Four times a day (QID) | INTRAMUSCULAR | Status: DC | PRN

## 2023-04-11 MED ORDER — 0.9 % SODIUM CHLORIDE (POUR BTL) OPTIME
TOPICAL | Status: DC | PRN
  Administered 2023-04-11: 1000 mL

## 2023-04-11 MED ORDER — GABAPENTIN 300 MG PO CAPS
600.0000 mg | ORAL_CAPSULE | Freq: Every day | ORAL | Status: DC
  Administered 2023-04-11 – 2023-04-12 (×2): 600 mg via ORAL
  Filled 2023-04-11 (×3): qty 2

## 2023-04-11 MED ORDER — ONDANSETRON HCL 4 MG/2ML IJ SOLN
4.0000 mg | Freq: Once | INTRAMUSCULAR | Status: DC | PRN
Start: 2023-04-11 — End: 2023-04-11

## 2023-04-11 MED ORDER — GLYCOPYRROLATE PF 0.2 MG/ML IJ SOSY
PREFILLED_SYRINGE | INTRAMUSCULAR | Status: DC | PRN
  Administered 2023-04-11: .2 mg via INTRAVENOUS

## 2023-04-11 MED ORDER — DOXYCYCLINE HYCLATE 100 MG PO TABS
100.0000 mg | ORAL_TABLET | Freq: Every day | ORAL | Status: DC
  Administered 2023-04-12 – 2023-04-13 (×2): 100 mg via ORAL
  Filled 2023-04-11 (×3): qty 1

## 2023-04-11 MED ORDER — BUPIVACAINE LIPOSOME 1.3 % IJ SUSP
INTRAMUSCULAR | Status: AC
  Filled 2023-04-11: qty 20

## 2023-04-11 MED ORDER — BUPIVACAINE-EPINEPHRINE (PF) 0.25% -1:200000 IJ SOLN
INTRAMUSCULAR | Status: DC | PRN
  Administered 2023-04-11: 40 mL

## 2023-04-11 MED ORDER — OXYCODONE HCL 5 MG/5ML PO SOLN: 5.0000 mg | Freq: Once | ORAL | Status: DC | PRN

## 2023-04-11 MED ORDER — SUGAMMADEX SODIUM 200 MG/2ML IV SOLN
INTRAVENOUS | Status: DC | PRN
  Administered 2023-04-11: 200 mg via INTRAVENOUS

## 2023-04-11 MED ORDER — BENAZEPRIL HCL 5 MG PO TABS
10.0000 mg | ORAL_TABLET | Freq: Every morning | ORAL | Status: DC
  Administered 2023-04-12 – 2023-04-13 (×2): 10 mg via ORAL
  Filled 2023-04-11 (×2): qty 2

## 2023-04-11 MED ORDER — PROPOFOL 10 MG/ML IV BOLUS
INTRAVENOUS | Status: AC
Start: 2023-04-11 — End: ?
  Filled 2023-04-11: qty 20

## 2023-04-11 MED ORDER — ONDANSETRON HCL 4 MG PO TABS: 4.0000 mg | ORAL_TABLET | Freq: Four times a day (QID) | ORAL | Status: DC | PRN

## 2023-04-11 MED ORDER — FLUOXETINE HCL 10 MG PO CAPS
10.0000 mg | ORAL_CAPSULE | Freq: Every day | ORAL | Status: DC
  Administered 2023-04-12 – 2023-04-13 (×2): 10 mg via ORAL
  Filled 2023-04-11 (×2): qty 1

## 2023-04-11 MED ORDER — CEFAZOLIN SODIUM-DEXTROSE 2-4 GM/100ML-% IV SOLN
2.0000 g | Freq: Three times a day (TID) | INTRAVENOUS | Status: AC
  Administered 2023-04-11 (×2): 2 g via INTRAVENOUS
  Filled 2023-04-11 (×3): qty 100

## 2023-04-11 MED ORDER — SODIUM CHLORIDE 0.9% FLUSH: 3.0000 mL | INTRAVENOUS | Status: DC | PRN

## 2023-04-11 MED ORDER — KETAMINE HCL 50 MG/5ML IJ SOSY
PREFILLED_SYRINGE | INTRAMUSCULAR | Status: AC
  Filled 2023-04-11: qty 5

## 2023-04-11 MED ORDER — BUPIVACAINE-EPINEPHRINE (PF) 0.5% -1:200000 IJ SOLN
INTRAMUSCULAR | Status: AC
  Filled 2023-04-11: qty 30

## 2023-04-11 MED ORDER — ROCURONIUM BROMIDE 100 MG/10ML IV SOLN
INTRAVENOUS | Status: DC | PRN
  Administered 2023-04-11: 10 mg via INTRAVENOUS
  Administered 2023-04-11: 100 mg via INTRAVENOUS
  Administered 2023-04-11: 10 mg via INTRAVENOUS

## 2023-04-11 MED ORDER — ACETAMINOPHEN 325 MG PO TABS
650.0000 mg | ORAL_TABLET | ORAL | Status: DC | PRN
  Administered 2023-04-11: 650 mg via ORAL
  Filled 2023-04-11: qty 2

## 2023-04-11 MED ORDER — THROMBIN 20000 UNITS EX SOLR
CUTANEOUS | Status: DC | PRN
  Administered 2023-04-11: 20 mL via TOPICAL

## 2023-04-11 MED ORDER — PHENYLEPHRINE HCL (PRESSORS) 10 MG/ML IV SOLN
INTRAVENOUS | Status: DC | PRN
  Administered 2023-04-11: 80 ug via INTRAVENOUS
  Administered 2023-04-11: 160 ug via INTRAVENOUS
  Administered 2023-04-11: 40 ug via INTRAVENOUS

## 2023-04-11 MED ORDER — CEFAZOLIN SODIUM-DEXTROSE 2-4 GM/100ML-% IV SOLN
2.0000 g | INTRAVENOUS | Status: AC
  Administered 2023-04-11: 2 g via INTRAVENOUS
  Filled 2023-04-11: qty 100

## 2023-04-11 MED ORDER — DOCUSATE SODIUM 100 MG PO CAPS
100.0000 mg | ORAL_CAPSULE | Freq: Two times a day (BID) | ORAL | Status: DC
  Administered 2023-04-11 – 2023-04-13 (×4): 100 mg via ORAL
  Filled 2023-04-11 (×5): qty 1

## 2023-04-11 MED ORDER — ACETAMINOPHEN 500 MG PO TABS
1000.0000 mg | ORAL_TABLET | Freq: Once | ORAL | Status: AC
  Administered 2023-04-11: 1000 mg via ORAL
  Filled 2023-04-11: qty 2

## 2023-04-11 MED ORDER — ONDANSETRON HCL 4 MG/2ML IJ SOLN
INTRAMUSCULAR | Status: DC | PRN
  Administered 2023-04-11: 4 mg via INTRAVENOUS

## 2023-04-11 MED ORDER — ACETAMINOPHEN 10 MG/ML IV SOLN: 1000.0000 mg | Freq: Once | INTRAVENOUS | Status: DC | PRN

## 2023-04-11 MED ORDER — KETAMINE HCL 10 MG/ML IJ SOLN
INTRAMUSCULAR | Status: DC | PRN
  Administered 2023-04-11 (×2): 10 mg via INTRAVENOUS

## 2023-04-11 MED ORDER — DEXAMETHASONE SODIUM PHOSPHATE 10 MG/ML IJ SOLN
INTRAMUSCULAR | Status: DC | PRN
  Administered 2023-04-11: 5 mg via INTRAVENOUS

## 2023-04-11 MED ORDER — AMISULPRIDE (ANTIEMETIC) 5 MG/2ML IV SOLN
10.0000 mg | Freq: Once | INTRAVENOUS | Status: DC | PRN
Start: 2023-04-11 — End: 2023-04-11

## 2023-04-11 MED ORDER — METHOCARBAMOL 500 MG PO TABS
500.0000 mg | ORAL_TABLET | Freq: Four times a day (QID) | ORAL | Status: DC | PRN
  Administered 2023-04-11: 500 mg via ORAL
  Administered 2023-04-12: 1000 mg via ORAL
  Administered 2023-04-13: 500 mg via ORAL
  Filled 2023-04-11 (×2): qty 1
  Filled 2023-04-11: qty 2

## 2023-04-11 MED ORDER — PANTOPRAZOLE SODIUM 40 MG PO TBEC
40.0000 mg | DELAYED_RELEASE_TABLET | Freq: Every day | ORAL | Status: DC
  Administered 2023-04-12 – 2023-04-13 (×2): 40 mg via ORAL
  Filled 2023-04-11 (×3): qty 1

## 2023-04-11 MED ORDER — CHLORHEXIDINE GLUCONATE 0.12 % MT SOLN
15.0000 mL | Freq: Once | OROMUCOSAL | Status: AC
  Administered 2023-04-11: 15 mL via OROMUCOSAL
  Filled 2023-04-11: qty 15

## 2023-04-11 MED ORDER — MENTHOL 3 MG MT LOZG: 1.0000 | LOZENGE | OROMUCOSAL | Status: DC | PRN

## 2023-04-11 MED ORDER — PROPOFOL 10 MG/ML IV BOLUS
INTRAVENOUS | Status: DC | PRN
  Administered 2023-04-11: 130 mg via INTRAVENOUS

## 2023-04-11 MED ORDER — THROMBIN 20000 UNITS EX KIT
PACK | CUTANEOUS | Status: AC
  Filled 2023-04-11: qty 1

## 2023-04-11 MED ORDER — PHENYLEPHRINE HCL-NACL 20-0.9 MG/250ML-% IV SOLN
INTRAVENOUS | Status: DC | PRN
  Administered 2023-04-11: 30 ug/min via INTRAVENOUS
  Administered 2023-04-11: 40 ug via INTRAVENOUS

## 2023-04-11 MED ORDER — FENTANYL CITRATE (PF) 250 MCG/5ML IJ SOLN
INTRAMUSCULAR | Status: DC | PRN
  Administered 2023-04-11: 50 ug via INTRAVENOUS
  Administered 2023-04-11: 25 ug via INTRAVENOUS

## 2023-04-11 MED ORDER — ZOLPIDEM TARTRATE 5 MG PO TABS: 5.0000 mg | ORAL_TABLET | Freq: Every evening | ORAL | Status: DC | PRN

## 2023-04-11 MED ORDER — HYDROCODONE-ACETAMINOPHEN 5-325 MG PO TABS
1.0000 | ORAL_TABLET | ORAL | Status: DC | PRN
  Administered 2023-04-13: 2 via ORAL
  Filled 2023-04-11: qty 2

## 2023-04-11 MED ORDER — MIDAZOLAM HCL 2 MG/2ML IJ SOLN
INTRAMUSCULAR | Status: AC
  Filled 2023-04-11: qty 2

## 2023-04-11 MED ORDER — ACETAMINOPHEN 650 MG RE SUPP: 650.0000 mg | RECTAL | Status: DC | PRN

## 2023-04-11 MED ORDER — OXYCODONE-ACETAMINOPHEN 5-325 MG PO TABS
1.0000 | ORAL_TABLET | ORAL | Status: DC | PRN
  Administered 2023-04-11 – 2023-04-13 (×5): 2 via ORAL
  Filled 2023-04-11 (×5): qty 2

## 2023-04-11 MED ORDER — BUPIVACAINE-EPINEPHRINE (PF) 0.25% -1:200000 IJ SOLN
INTRAMUSCULAR | Status: AC
  Filled 2023-04-11: qty 30

## 2023-04-11 MED ORDER — ORAL CARE MOUTH RINSE: 15.0000 mL | Freq: Once | OROMUCOSAL | Status: AC

## 2023-04-11 MED ORDER — NITROGLYCERIN 0.4 MG SL SUBL: 0.4000 mg | SUBLINGUAL_TABLET | SUBLINGUAL | Status: DC | PRN

## 2023-04-11 MED ORDER — FLEET ENEMA RE ENEM: 1.0000 | ENEMA | Freq: Once | RECTAL | Status: DC | PRN

## 2023-04-11 MED ORDER — OXYCODONE HCL 5 MG PO TABS: 5.0000 mg | ORAL_TABLET | Freq: Once | ORAL | Status: DC | PRN

## 2023-04-11 SURGICAL SUPPLY — 89 items
BAG COUNTER SPONGE SURGICOUNT (BAG) ×1 IMPLANT
BENZOIN TINCTURE PRP APPL 2/3 (GAUZE/BANDAGES/DRESSINGS) ×1 IMPLANT
BLADE CLIPPER SURG (BLADE) IMPLANT
BUR PRESCISION 1.7 ELITE (BURR) ×1 IMPLANT
BUR ROUND FLUTED 5 RND (BURR) ×1 IMPLANT
BUR ROUND PRECISION 4.0 (BURR) IMPLANT
BUR SABER RD CUTTING 3.0 (BURR) IMPLANT
CAGE SABLE 10X22 6-12 0D (Cage) IMPLANT
CANNULA GRAFT BNE VG PRE-FILL (Bone Implant) IMPLANT
CNTNR URN SCR LID CUP LEK RST (MISCELLANEOUS) ×1 IMPLANT
COVER BACK TABLE 60X90IN (DRAPES) ×1 IMPLANT
COVER MAYO STAND STRL (DRAPES) ×2 IMPLANT
COVER SURGICAL LIGHT HANDLE (MISCELLANEOUS) ×1 IMPLANT
DISPENSER GRAFT BNE VG (MISCELLANEOUS) IMPLANT
DISPENSER VIVIGEN BONE GRAFT (MISCELLANEOUS) ×1
DRAIN CHANNEL 15F RND FF W/TCR (WOUND CARE) IMPLANT
DRAPE C-ARM 42X72 X-RAY (DRAPES) ×1 IMPLANT
DRAPE C-ARMOR (DRAPES) IMPLANT
DRAPE POUCH INSTRU U-SHP 10X18 (DRAPES) ×1 IMPLANT
DRAPE SURG 17X23 STRL (DRAPES) ×4 IMPLANT
DURAPREP 26ML APPLICATOR (WOUND CARE) ×1 IMPLANT
ELECT BLADE 4.0 EZ CLEAN MEGAD (MISCELLANEOUS)
ELECT CAUTERY BLADE 6.4 (BLADE) ×1 IMPLANT
ELECT REM PT RETURN 9FT ADLT (ELECTROSURGICAL) ×1
ELECTRODE BLDE 4.0 EZ CLN MEGD (MISCELLANEOUS) ×1 IMPLANT
ELECTRODE REM PT RTRN 9FT ADLT (ELECTROSURGICAL) ×1 IMPLANT
EVACUATOR SILICONE 100CC (DRAIN) IMPLANT
FILTER STRAW FLUID ASPIR (MISCELLANEOUS) ×1 IMPLANT
GAUZE 4X4 16PLY ~~LOC~~+RFID DBL (SPONGE) ×1 IMPLANT
GAUZE SPONGE 4X4 12PLY STRL (GAUZE/BANDAGES/DRESSINGS) ×1 IMPLANT
GLOVE BIO SURGEON STRL SZ 6.5 (GLOVE) ×1 IMPLANT
GLOVE BIO SURGEON STRL SZ8 (GLOVE) ×1 IMPLANT
GLOVE BIOGEL PI IND STRL 7.0 (GLOVE) ×1 IMPLANT
GLOVE BIOGEL PI IND STRL 8 (GLOVE) ×1 IMPLANT
GLOVE SURG ENC MOIS LTX SZ6.5 (GLOVE) ×1 IMPLANT
GOWN STRL REUS W/ TWL LRG LVL3 (GOWN DISPOSABLE) ×2 IMPLANT
GOWN STRL REUS W/ TWL XL LVL3 (GOWN DISPOSABLE) ×1 IMPLANT
GRAFT BONE CANNULA VIVIGEN 3 (Bone Implant) ×2 IMPLANT
IV CATH 14GX2 1/4 (CATHETERS) ×1 IMPLANT
KIT BASIN OR (CUSTOM PROCEDURE TRAY) ×1 IMPLANT
KIT POSITION SURG JACKSON T1 (MISCELLANEOUS) ×1 IMPLANT
KIT TURNOVER KIT B (KITS) ×1 IMPLANT
MARKER SKIN DUAL TIP RULER LAB (MISCELLANEOUS) ×2 IMPLANT
NDL 18GX1X1/2 (RX/OR ONLY) (NEEDLE) ×1 IMPLANT
NDL 22X1.5 STRL (OR ONLY) (MISCELLANEOUS) ×2 IMPLANT
NDL HYPO 22X1.5 SAFETY MO (MISCELLANEOUS) IMPLANT
NDL HYPO 25GX1X1/2 BEV (NEEDLE) ×1 IMPLANT
NDL SPNL 18GX3.5 QUINCKE PK (NEEDLE) ×2 IMPLANT
NEEDLE 18GX1X1/2 (RX/OR ONLY) (NEEDLE) ×1
NEEDLE 22X1.5 STRL (OR ONLY) (MISCELLANEOUS)
NEEDLE HYPO 22X1.5 SAFETY MO (MISCELLANEOUS) ×1
NEEDLE HYPO 25GX1X1/2 BEV (NEEDLE) ×1
NEEDLE SPNL 18GX3.5 QUINCKE PK (NEEDLE) ×2
NS IRRIG 1000ML POUR BTL (IV SOLUTION) ×1 IMPLANT
PACK LAMINECTOMY ORTHO (CUSTOM PROCEDURE TRAY) ×1 IMPLANT
PACK UNIVERSAL I (CUSTOM PROCEDURE TRAY) ×1 IMPLANT
PAD ARMBOARD 7.5X6 YLW CONV (MISCELLANEOUS) ×2 IMPLANT
PATTIES SURGICAL .5 X1 (DISPOSABLE) ×1 IMPLANT
PATTIES SURGICAL .5X1.5 (GAUZE/BANDAGES/DRESSINGS) ×1 IMPLANT
PUTTY DBX 2.5CC (Putty) ×1 IMPLANT
PUTTY DBX 2.5CC DEPUY (Putty) IMPLANT
ROD EXPEDIUM PRE BENT 5.5X75 (Rod) IMPLANT
SCREW CORTICAL VIPER 7X35 (Screw) IMPLANT
SCREW PED FA VIPER 8X35 (Screw) IMPLANT
SCREW SET SINGLE INNER (Screw) IMPLANT
SCREW VIPER CORT FIX 6X35 (Screw) IMPLANT
SPONGE INTESTINAL PEANUT (DISPOSABLE) ×1 IMPLANT
SPONGE SURGIFOAM ABS GEL 100 (HEMOSTASIS) ×1 IMPLANT
STRIP CLOSURE SKIN 1/2X4 (GAUZE/BANDAGES/DRESSINGS) ×2 IMPLANT
STRIP CLOSURE SKIN 1/4X3 (GAUZE/BANDAGES/DRESSINGS) IMPLANT
SURGIFLO W/THROMBIN 8M KIT (HEMOSTASIS) IMPLANT
SUT MNCRL AB 4-0 PS2 18 (SUTURE) ×1 IMPLANT
SUT VIC AB 0 CT1 18XCR BRD 8 (SUTURE) ×1 IMPLANT
SUT VIC AB 1 CT1 18XCR BRD 8 (SUTURE) ×1 IMPLANT
SUT VIC AB 2-0 CT2 18 VCP726D (SUTURE) ×1 IMPLANT
SYR 20ML LL LF (SYRINGE) ×2 IMPLANT
SYR BULB IRRIG 60ML STRL (SYRINGE) ×1 IMPLANT
SYR CONTROL 10ML LL (SYRINGE) ×2 IMPLANT
SYR TB 1ML LUER SLIP (SYRINGE) ×1 IMPLANT
TAP EXPEDIUM DL 4.35 (INSTRUMENTS) IMPLANT
TAP EXPEDIUM DL 5.0 (INSTRUMENTS) IMPLANT
TAP EXPEDIUM DL 6.0 (INSTRUMENTS) IMPLANT
TAP EXPEDIUM DL 7X2 (INSTRUMENTS) IMPLANT
TAP EXPEDIUM DL 8.0 (INSTRUMENTS) IMPLANT
TAPE CLOTH SOFT 2X10 (GAUZE/BANDAGES/DRESSINGS) IMPLANT
TRAY FOLEY MTR SLVR 16FR STAT (SET/KITS/TRAYS/PACK) ×1 IMPLANT
TUBE FUNNEL GL DISP (ORTHOPEDIC DISPOSABLE SUPPLIES) IMPLANT
WATER STERILE IRR 1000ML POUR (IV SOLUTION) ×1 IMPLANT
YANKAUER SUCT BULB TIP NO VENT (SUCTIONS) ×1 IMPLANT

## 2023-04-11 NOTE — H&P (Signed)
 PREOPERATIVE H&P  Chief Complaint: Right leg pain  HPI: Jessica Landry is a 68 y.o. female who presents with ongoing pain in the right leg  MRI reveals severe stenosis at L4/5, the level above the patient's fusion  Patient has failed multiple forms of conservative care and continues to have pain (see office notes for additional details regarding the patient's full course of treatment)  Past Medical History:  Diagnosis Date   Anxiety    Arthritis    knees & back    CAD S/P percutaneous coronary angioplasty 03/07/2011   a) PCI of mRCA with Promus Element DES 3.5 mm x 28 mm (4.2 -- 3.7 mm); b) Myoview  04/2011: EF 81% (small LV Cavity), fixed basal-mid Inferior Infarct, No Ischemia, 10 METS   Complication of anesthesia    reported prolonged emergence   Depression 03/08/2011   Dr. Corinthia- [place], [facility]   Essential hypertension 03/07/2011   Family history of adverse reaction to anesthesia    GERD (gastroesophageal reflux disease)    Headache    chocolate & red wine triggers /w bad headaches    History of back surgery    Hyperlipidemia with target LDL less than 70 04/09/2010   Statin Intolerance (tried Lipitor & Crestor )   Insomnia    without significant sleeping disorders   Iron  deficiency anemia 03/09/2011   Neuropathy    PONV (postoperative nausea and vomiting)    ST-segment elevation myocardial infarction (STEMI) of inferior wall (HCC) 03/07/2011   RCA Occlusion --> PCI of mRCA; ECHO 03/2011: EF > 55%, MIld Inferior HK, Mild AoV Sclerosis.   Vocal cord polyp 04/10/2003   Past Surgical History:  Procedure Laterality Date   ABDOMINAL HYSTERECTOMY     APPENDECTOMY  07/04/2000   Normal appendix, adhesions noted.   BACK SURGERY     cyst on spinal cord - lumbar   BREAST BIOPSY Right 01/31/1999   fibrocystic changes, ductal adenosis, focal atypical ductal epithelium.   BREAST EXCISIONAL BIOPSY     CHOLECYSTECTOMY  03/25/1998   Dr. Dessa, chronic cholecystitis.    COLONOSCOPY WITH PROPOFOL  N/A 09/28/2014   Procedure: COLONOSCOPY WITH PROPOFOL ;  Surgeon: Reyes LELON Dessa, MD;  Location: [facility] ENDOSCOPY;  Service: Endoscopy;  Laterality: N/A;   CORONARY ANGIOPLASTY WITH STENT PLACEMENT  03/07/2011   inferior STEMI - RCA PROMUS 3.5 mm x 28 mm DES  (post Dil 3.7 distal to 4.2 prox)   DENTAL SURGERY  2016   ESOPHAGOGASTRODUODENOSCOPY N/A 09/28/2014   Procedure: ESOPHAGOGASTRODUODENOSCOPY (EGD);  Surgeon: Reyes LELON Dessa, MD;  Location: [facility] ENDOSCOPY;  Service: Endoscopy;  Laterality: N/A;   EYE SURGERY     blepheroplasty- bilateral   JOINT REPLACEMENT     KNEE SURGERY Left 2015   KNEE SURGERY Right 2010   Baker cyst   LEFT HEART CATH AND CORONARY ANGIOGRAPHY  03/08/2011   improved flow from the PCI   LEFT HEART CATHETERIZATION WITH CORONARY ANGIOGRAM N/A 03/07/2011   Procedure: LEFT HEART CATHETERIZATION WITH CORONARY ANGIOGRAM;  Surgeon: Alm LELON Clay, MD;  Location: [facility] CATH LAB;  Service: Cardiovascular;  Laterality: N/A;   LEFT HEART CATHETERIZATION WITH CORONARY ANGIOGRAM N/A 03/08/2011   Procedure: LEFT HEART CATHETERIZATION WITH CORONARY ANGIOGRAM;  Surgeon: Alm LELON Clay, MD;  Location: [facility] CATH LAB;  Service: Cardiovascular;  Laterality: N/A;   NM MYOCAR PERF WALL MOTION  08/2016   Lexiscan : LOW RISK.  No ischemia or infarction.  Hyperdynamic LV function .  EF> 65%   skin nodule  Left 09/11/1999   dermatofibroma left lower leg, positive lateral margin.   TOTAL KNEE ARTHROPLASTY Bilateral 07/20/2015   Procedure: TOTAL KNEE BILATERAL;  Surgeon: Toribio JULIANNA Chancy, MD;  Location: [facility] OR;  Service: Orthopedics;  Laterality: Bilateral;   TRANSFORAMINAL LUMBAR INTERBODY FUSION (TLIF) WITH PEDICLE SCREW FIXATION 1 LEVEL Left 05/10/2022   Procedure: LEFT-SIDED LUMBAR THREE - LUMBAR FOUR TRANSFORAMINAL LUMBAR INTERBODY FUSION AND DECOMPRESSION WITH INSTRUMENTATION AND ALLOGRAFT;  Surgeon: Beuford Anes, MD;  Location: [facility] OR;  Service: Orthopedics;   Laterality: Left;   TRANSTHORACIC ECHOCARDIOGRAM  03/27/2011   LV cavity is small,EF =>55%,MILD INFERIOR HYPOKINESIS; Mild Aortic Sclerosis   TRANSTHORACIC ECHOCARDIOGRAM  04/22/2020    EF 60-65%.  GRII DD.  No RWM A.  Normal RV.  Mild LA dilation.  (This is not consistent with GRII D), mild ascending aorta dilation of 38 mm.  Otherwise normal.   UPPER GI ENDOSCOPY  02/22/2003   Dr. Dessa, normal   Social History   Socioeconomic History   Marital status: Married    Spouse name: Marcey   Number of children: 0   Years of education: 16   Highest education level: Bachelor's degree (e.g., BA, AB, BS)  Occupational History   Occupation: unemployed  Tobacco Use   Smoking status: Former    Current packs/day: 0.00    Average packs/day: 0.3 packs/day for 40.0 years (10.0 ttl pk-yrs)    Types: Cigarettes    Start date: 03/07/1971    Quit date: 03/07/2011    Years since quitting: 12.1   Smokeless tobacco: Never  Vaping Use   Vaping status: Never Used  Substance and Sexual Activity   Alcohol use: No    Alcohol/week: 0.0 standard drinks of alcohol   Drug use: No   Sexual activity: Yes  Other Topics Concern   Not on file  Social History Narrative   Divorced woman -- Now Remarried to her long-term partner.   Exercises routinely on a daily basis roughly 45 minutes a day walking.  Quit smoking at the time of her MI. Does not drink.   Social Drivers of Corporate Investment Banker Strain: Low Risk  (01/04/2023)   Received from [facility]   Overall Financial Resource Strain (CARDIA)    Difficulty of Paying Living Expenses: Not very hard  Food Insecurity: No Food Insecurity (01/04/2023)   Received from [facility]   Hunger Vital Sign    Worried About Running Out of Food in the Last Year: Never true    Ran Out of Food in the Last Year: Never true  Transportation Needs: No Transportation Needs (01/04/2023)   Received from [facility] - Transportation    In the past 12 months, has lack of transportation kept you from medical appointments or from getting medications?: No    Lack of Transportation (Non-Medical): No  Physical Activity: Inactive (07/23/2018)   Exercise Vital Sign    Days of Exercise per Week: 0 days    Minutes of Exercise per Session: 0 min  Stress: No Stress Concern Present (07/23/2018)   [employer] of Occupational Health - Occupational Stress Questionnaire    Feeling of Stress : Not at all  Social Connections: Unknown (07/23/2018)   Social Connection and Isolation Panel [NHANES]    Frequency of Communication with Friends and Family: Not on file    Frequency of Social Gatherings with Friends and Family: Not on file    Attends Religious Services:  Never    Active Member of Clubs or Organizations: No    Attends Banker Meetings: Never    Marital Status: Married   Family History  Problem Relation Age of Onset   Heart attack Mother    Coronary artery disease Mother    Stroke Mother    Depression Mother    Anxiety disorder Mother    Alzheimer's disease Father    Stroke Father    Breast cancer Sister 7   Depression Brother    Drug abuse Brother    Healthy Sister    Bipolar disorder Sister    Allergies  Allergen Reactions   Bee Venom Anaphylaxis   Statins Other (See Comments)    Myalgias & fatigue   Etodolac    Prednisone Palpitations and Other (See Comments)    Can take small amounts. HR increase; increased energy    Sulfa Antibiotics Other (See Comments)    Elevated B/P, skin turns red   Prior to Admission medications   Medication Sig Start Date End Date Taking? Authorizing Provider  acetaminophen  (TYLENOL ) 500 MG tablet Take 500-1,000 mg by mouth every 6 (six) hours as needed for moderate pain (pain score 4-6).   Yes [provider]  acetaminophen -codeine (TYLENOL  #3) 300-30 MG tablet Take 1-2 tablets by mouth every 6 (six) hours as needed. 03/01/23  Yes  [provider]  benazepril  (LOTENSIN ) 10 MG tablet TAKE 1 TABLET EVERY MORNING 06/12/22  Yes Anner Alm ORN, MD  buPROPion  (WELLBUTRIN  XL) 300 MG 24 hr tablet TAKE 1 TABLET EVERY MORNING 03/12/19  Yes Eappen, Saramma, MD  doxycycline  (VIBRA -TABS) 100 MG tablet Take 100 mg by mouth daily. 03/15/23  Yes [provider]  Evolocumab  (REPATHA  SURECLICK) 140 MG/ML SOAJ ADMINISTER 1 ML(140 MG) UNDER THE SKIN EVERY 2 WEEKS 04/09/23  Yes Anner Alm ORN, MD  FLUoxetine  (PROZAC ) 10 MG capsule TAKE 1 CAPSULE EVERY DAY  WITH  40MG  03/12/19  Yes Eappen, Saramma, MD  FLUoxetine  (PROZAC ) 40 MG capsule TAKE 1 CAPSULE EVERY DAY BEFORE BREAKFAST  WITH  10MG   CAP 03/12/19  Yes Eappen, Saramma, MD  gabapentin  (NEURONTIN ) 300 MG capsule Take 2 capsules (600 mg total) by mouth at bedtime. 09/05/18  Yes Bacigalupo, Angela M, MD  methocarbamol  (ROBAXIN ) 500 MG tablet Take 1-2 tablets (500-1,000 mg total) by mouth every 6 (six) hours as needed for muscle spasms. 05/11/22  Yes McKenzie, Kayla J, PA-C  metoprolol  tartrate (LOPRESSOR ) 50 MG tablet TAKE 1 TABLET TWICE DAILY 06/12/22  Yes Anner Alm ORN, MD  omeprazole  (PRILOSEC ) 20 MG capsule TAKE 1 CAPSULE(20 MG) BY MOUTH DAILY 03/10/18  Yes Bacigalupo, Jon HERO, MD  nitroGLYCERIN  (NITROSTAT ) 0.4 MG SL tablet Place 1 tablet (0.4 mg total) under the tongue every 5 (five) minutes as needed for chest pain (up to 3 tabs in 15 mins and then call 911). 11/07/22   Gordan Huxley, MD  oxyCODONE -acetaminophen  (PERCOCET/ROXICET) 5-325 MG tablet Take 1-2 tablets by mouth every 4 (four) hours as needed for severe pain. Patient not taking: Reported on 03/27/2023 05/11/22   McKenzie, Kayla J, PA-C     All other systems have been reviewed and were otherwise negative with the exception of those mentioned in the HPI and as above.  Physical Exam: Vitals:   04/11/23 0558  BP: 135/78  Pulse: (!) 59  Resp: 18  Temp: 98.4 F (36.9 C)  SpO2: 100%    Body mass index is 24.1  kg/m.  General: Alert, no acute distress Cardiovascular:  No pedal edema Respiratory: No cyanosis, no use of accessory musculature Skin: No lesions in the area of chief complaint Neurologic: Sensation intact distally Psychiatric: Patient is competent for consent with normal mood and affect Lymphatic: No axillary or cervical lymphadenopathy   Assessment/Plan: LUMBAR RADICULOPATHY, INSTABILITY AT L4/5, POSSIBEL NONUNION AT L3/4  Plan for Procedure(s): RIGHT-SIDED LUMBAR 4 - LUMBAR 5 TRANSFORAMINAL LUMBAR INTERBODY FUSION AND DECOMPRESSION WITH INSTRUMENTATION AND ALLOGRAFT, POSSIBLE L3/4 FUSION WITH REVISION INSTRUMENTATION   Oneil LITTIE Priestly, MD 04/11/2023 7:23 AM

## 2023-04-11 NOTE — Plan of Care (Signed)
  Problem: Education: Goal: Ability to verbalize activity precautions or restrictions will improve Outcome: Progressing   Problem: Activity: Goal: Ability to avoid complications of mobility impairment will improve Outcome: Progressing Goal: Ability to tolerate increased activity will improve Outcome: Progressing   Problem: Bladder/Genitourinary: Goal: Urinary functional status for postoperative course will improve Outcome: Progressing   Problem: Education: Goal: Knowledge of General Education information will improve Description: Including pain rating scale, medication(s)/side effects and non-pharmacologic comfort measures Outcome: Progressing

## 2023-04-11 NOTE — Anesthesia Postprocedure Evaluation (Signed)
 Anesthesia Post Note  Patient: Jessica Landry  Procedure(s) Performed: RIGHT-SIDED LUMBAR 4 - LUMBAR 5 TRANSFORAMINAL LUMBAR INTERBODY FUSION AND DECOMPRESSION WITH INSTRUMENTATION AND ALLOGRAFT (Right: Spine Lumbar)     Patient location during evaluation: PACU Anesthesia Type: General Level of consciousness: awake and alert Pain management: pain level controlled Vital Signs Assessment: post-procedure vital signs reviewed and stable Respiratory status: spontaneous breathing, nonlabored ventilation, respiratory function stable and patient connected to nasal cannula oxygen Cardiovascular status: blood pressure returned to baseline and stable Postop Assessment: no apparent nausea or vomiting Anesthetic complications: no   No notable events documented.  Last Vitals:  Vitals:   04/11/23 1230 04/11/23 1300  BP: (!) 105/54 (!) 105/53  Pulse: 72 75  Resp: 18 13  Temp:    SpO2: 93% 93%    Last Pain:  Vitals:   04/11/23 1300  TempSrc:   PainSc: Asleep                 Garnette DELENA Gab

## 2023-04-11 NOTE — Anesthesia Procedure Notes (Signed)
 Procedure Name: Intubation Date/Time: 04/11/2023 7:49 AM  Performed by: Bess Josette ORN, CRNAPre-anesthesia Checklist: Patient identified, Emergency Drugs available, Suction available, Patient being monitored and Timeout performed Patient Re-evaluated:Patient Re-evaluated prior to induction Oxygen Delivery Method: Circle system utilized Preoxygenation: Pre-oxygenation with 100% oxygen Induction Type: IV induction Ventilation: Mask ventilation without difficulty Laryngoscope Size: McGrath and 3 Grade View: Grade I Tube type: Oral Tube size: 7.0 mm Number of attempts: 1 Airway Equipment and Method: Stylet Placement Confirmation: ETT inserted through vocal cords under direct vision, positive ETCO2, breath sounds checked- equal and bilateral and CO2 detector Secured at: 23 cm Tube secured with: Tape Dental Injury: Teeth and Oropharynx as per pre-operative assessment

## 2023-04-11 NOTE — Op Note (Signed)
 PATIENT NAME: Jessica Landry   MEDICAL RECORD NO.:   [id number]   DATE OF BIRTH: 01-12-1956   DATE OF PROCEDURE: 04/11/2023                               OPERATIVE REPORT     PREOPERATIVE DIAGNOSES: 1. Right-sided lumbar radiculopathy. 2. L4-5 spinal stenosis. 3. Severe L4-5 degenerative disc disease 4.  Status post previous instrumented L3-4 fusion, with a possible nonunion   POSTOPERATIVE DIAGNOSES: 1. Right-sided lumbar radiculopathy. 2. L4-5 spinal stenosis. 3. Severe L4-5 degenerative disc disease 4.  Status post previous instrumented L3-4 fusion, with a nonunion at L3/4   PROCEDURES: 1. L4/5 decompression 2. Right-sided L4-5 transforaminal lumbar interbody fusion. 3. Left-sided L4-5 posterolateral fusion. 4. L3-4 posterolateral fusion due to nonunion 5. Insertion of interbody device x1 (Globus expandable intervertebral spacer). 6. Placement of segmental posterior instrumentation L5 bilaterally, with revision of right-sided L3 and L4 pedicle screws 7. Use of local autograft. 8. Use of morselized allograft - Vivigen 9. Intraoperative use of fluoroscopy.   SURGEON:  Oneil Priestly, MD.   ASSISTANTBETHA Ileana Clara, PA-C.   ANESTHESIA:  General endotracheal anesthesia.   COMPLICATIONS:  None.   DISPOSITION:  Stable.   ESTIMATED BLOOD LOSS:  100cc   INDICATIONS FOR SURGERY:  Briefly, [name] is a pleasant 68 -year-old female who did present to me with severe and ongoing pain in the right leg. I did feel that the symptoms were secondary to the findings noted above.  Preoperatively, I did obtain a CAT scan of her lumbar spine, as she is approximately 1 year status post an instrumented fusion procedure at L3-4.  The CAT scan was consistent with a possible nonunion at L3-4.  The plan today was to proceed with a decompression and fusion at L4-5, to assess her fusion at L3-4, and to possibly proceed with a revision fusion posteriorly at L3-4.  The nonunion was in fact  encountered, and as noted in this operative report, and L3-4 posterolateral fusion was also performed.  This was thoroughly explained to the patient, and after failing nonoperative measures, she did wish to proceed with surgery.  OPERATIVE DETAILS:  On 04/11/2023, the patient was brought to surgery and general endotracheal anesthesia was administered.  The patient was placed prone on a well-padded flat Jackson bed with a spinal frame.  Antibiotics were given and a time-out procedure was performed. The back was prepped and draped in the usual fashion.  A midline incision was made overlying the L3-4 and L4-5 intervertebral spaces.  The fascia was incised at the midline.  The paraspinal musculature was bluntly swept laterally.  At this point, the patient's previous hardware spanning L3-4 was noted.  Bilaterally, the caps were removed, as were the interconnecting rods.  I did subperiosteally expose the facet joint on the right side at L3-4.  I then firmly grasped the pedicles at L3 and L4 using a Kocher.  I then performed a pushing and pulling maneuver, and there was motion noted across the L3-4 intervertebral space, confirming a nonunion.  At this point, the L3 and L4 pedicle screws were removed on the right, and the right facet joint and posterolateral gutter was subperiosteally exposed.  I then used a high-speed bur to decorticate the right L3-4 facet joint and the right L3 and L4 transverse processes.  I then upsized the right L3 pedicle screw to a 7 x 35 mm screw.  Bone wax was placed into the cannulated right L4 pedicle hole, after tapping up to an 8 mm tap.  At this point, I brought in fluoroscopy, and subperiosteally expose the facet joints bilaterally at L4-5.  The right and left L5 pedicles were cannulated, using a cortical medial to lateral trajectory technique.  I did tap up to a 6 mm tap bilaterally.  On the left, a 6 x 35 mm screw was placed, and a 75 mm rod was secured into the tulip heads on the  left side, caps were placed and the caps were then provisionally tightened.  On the right side, the cannulated right L5 pedicle was filled with bone wax.    I then proceeded with the decompressive aspect of the procedure.  Focusing on the right at L4-5, I did perform a full facetectomy.  In doing so, I was able to entirely decompress the foramen on the right.  The exiting right L4 nerve was noted, and was entirely decompressed.  Subsequent to this, an annulotomy was made at the posterolateral aspect of the L4-5 intervertebral disc space.  I then used a series of paddle scrapers and curettes to perform a thorough and complete L4-5 intervertebral discectomy. The intervertebral space was then liberally packed with autograft as well as allograft in the form of Vivigen, as was the appropriate-sized intervertebral spacer.  The spacer was then tamped into position in the usual fashion, and expanded to 8.25 mm in height. I was very pleased with the press-fit of the spacer.  I then placed a 6 mm screw on the right at L5, and an 8 x 35 mm screw on the right at L4. A 75 mm rod was then placed and caps were placed.  All caps were then locked.  The wound was copiously irrigated with a total of approximately 3 L prior to placing the bone graft.  Additional autograft and allograft was then packed into the posterolateral gutter on the right side at L3-4, and on the left side at L4-5, to help aid in the success of the fusion.  The wound was  explored for any undue bleeding and there was no substantial bleeding encountered.  Gel-Foam was placed over the laminectomy site.  The wound was then closed in layers using #1 Vicryl followed by 2-0 Vicryl, followed by 4-0 Monocryl.  Benzoin and Steri-Strips were applied followed by sterile dressing.     Of note, Ileana Clara was my assistant throughout surgery, and did aid in retraction, suctioning, the decompression, placement of the hardware, and closure.     Oneil Priestly, MD

## 2023-04-11 NOTE — Transfer of Care (Signed)
 Immediate Anesthesia Transfer of Care Note  Patient: Jessica Landry  Procedure(s) Performed: RIGHT-SIDED LUMBAR 4 - LUMBAR 5 TRANSFORAMINAL LUMBAR INTERBODY FUSION AND DECOMPRESSION WITH INSTRUMENTATION AND ALLOGRAFT (Right: Spine Lumbar)  Patient Location: PACU  Anesthesia Type:General  Level of Consciousness: drowsy  Airway & Oxygen Therapy: Patient Spontanous Breathing  Post-op Assessment: Report given to RN  Post vital signs: stable  Last Vitals:  Vitals Value Taken Time  BP 133/68 04/11/23 1045  Temp    Pulse 75 04/11/23 1052  Resp 16 04/11/23 1052  SpO2 98 % 04/11/23 1052  Vitals shown include unfiled device data.  Last Pain:  Vitals:   04/11/23 1053  TempSrc:   PainSc: 8          Complications: No notable events documented.

## 2023-04-12 ENCOUNTER — Encounter (HOSPITAL_COMMUNITY): Payer: Self-pay | Admitting: Orthopedic Surgery

## 2023-04-12 ENCOUNTER — Other Ambulatory Visit: Payer: Self-pay

## 2023-04-12 ENCOUNTER — Other Ambulatory Visit (HOSPITAL_COMMUNITY): Payer: Self-pay

## 2023-04-12 DIAGNOSIS — I2510 Atherosclerotic heart disease of native coronary artery without angina pectoris: Secondary | ICD-10-CM | POA: Diagnosis not present

## 2023-04-12 DIAGNOSIS — Z955 Presence of coronary angioplasty implant and graft: Secondary | ICD-10-CM | POA: Diagnosis not present

## 2023-04-12 DIAGNOSIS — I10 Essential (primary) hypertension: Secondary | ICD-10-CM | POA: Diagnosis not present

## 2023-04-12 DIAGNOSIS — M5416 Radiculopathy, lumbar region: Secondary | ICD-10-CM | POA: Diagnosis not present

## 2023-04-12 DIAGNOSIS — Z79899 Other long term (current) drug therapy: Secondary | ICD-10-CM | POA: Diagnosis not present

## 2023-04-12 DIAGNOSIS — M48061 Spinal stenosis, lumbar region without neurogenic claudication: Secondary | ICD-10-CM | POA: Diagnosis not present

## 2023-04-12 DIAGNOSIS — Z87891 Personal history of nicotine dependence: Secondary | ICD-10-CM | POA: Diagnosis not present

## 2023-04-12 DIAGNOSIS — Z96653 Presence of artificial knee joint, bilateral: Secondary | ICD-10-CM | POA: Diagnosis not present

## 2023-04-12 MED ORDER — REPATHA SURECLICK 140 MG/ML ~~LOC~~ SOAJ: SUBCUTANEOUS | 3 refills | Status: AC

## 2023-04-12 MED ORDER — HYDROMORPHONE HCL 1 MG/ML IJ SOLN
1.0000 mg | INTRAMUSCULAR | Status: DC | PRN
  Administered 2023-04-12 (×2): 1 mg via INTRAVENOUS
  Filled 2023-04-12 (×2): qty 1

## 2023-04-12 MED ORDER — METHOCARBAMOL 500 MG PO TABS
500.0000 mg | ORAL_TABLET | Freq: Four times a day (QID) | ORAL | 1 refills | Status: AC | PRN
  Filled 2023-04-12: qty 60, 8d supply, fill #0

## 2023-04-12 MED ORDER — OXYCODONE-ACETAMINOPHEN 5-325 MG PO TABS
1.0000 | ORAL_TABLET | Freq: Four times a day (QID) | ORAL | 0 refills | Status: AC | PRN
  Filled 2023-04-12: qty 40, 5d supply, fill #0

## 2023-04-12 MED FILL — Thrombin For Soln Kit 20000 Unit: CUTANEOUS | Qty: 1 | Status: AC

## 2023-04-12 NOTE — Progress Notes (Signed)
 Pts husband stating he is uncomfortable with pt being sent home today. Pt reporting having a difficult night with little relief of pain. NP paged and discussed and agreed for pt to stay another night. Discharge order to be discontinued.

## 2023-04-12 NOTE — Progress Notes (Signed)
    Patient doing well postop day 1 from lumbar fusion at adjacent segment.  Her leg pain is resolved and she is quite comfortable at this venture.  She did have some difficult time of back pain overnight and was not obtaining her medications in a timely manner resulting in her calling our office answering service ultimately.  At this venture she is much better and is eager to progress home.  Her TLSO brace is at bedside and her therapist has just arrived.  She has been walking to the bathroom without difficulty.  She has been eating and drinking.  Normal bowel and bladder function.  Physical Exam: Vitals:   04/12/23 0554 04/12/23 0843  BP: (!) 97/56 99/72  Pulse: 66 69  Resp: 19 18  Temp: 98.5 F (36.9 C) 98.7 F (37.1 C)  SpO2: 97%     Dressing in place, TLSO at bedside, patient appears comfortable with normal mood and affect. NVI  POD #1 s/p lumbar fusion with resolution of radicular symptomatology, doing well  - up with PT/OT, encourage ambulation - Percocet for pain, Robaxin  for muscle spasms - likely d/c home today with f/u in 2 weeks

## 2023-04-12 NOTE — Evaluation (Signed)
 Occupational Therapy Evaluation Patient Details Name: Jessica Landry MRN: [id number] DOB: [birth year] Today's Date: 04/12/2023   History of Present Illness 68 y.o. female presents to [facility] w/ R leg pain w/ MRI revealing severe stenosis at L4/5 above prior fusion. S/p R sided transforaminal L4/L5 interbody fusion, L sided L4/L5 posterolateral fusion, L3/L4 posterolateral fusion d/t nonunion, insertion of interbody device on 1/2. PMH significant for CAD s/p percutaneous coronary angioplasty, essential HTN, GERD, HLD, STEMI, back surgery, L and R knee surgeries.   Clinical Impression   PTA patient reports independent and driving. Admitted for above and presents with problem list below.  She is very lethargic today, requires constant cueing to maintain alertness during session and often nods off.  She reports pain is controlled now but she did not sleep well last night because of it.  She is able to follow simple commands and is oriented; able to recall 2/3 back precautions.  She requires up to min assist for ADLs, did not attempt mobilization due to lethargy this session.  Educated pt and spouse on back brace and wear schedule, back precautions and ADL compensatory techniques. At this time recommend 24/7 support and continued OT acutely and at [facility] level to optimize independence, safety and return to PLOF. Will follow.       If plan is discharge home, recommend the following: A little help with bathing/dressing/bathroom;A little help with walking and/or transfers;Assistance with cooking/housework;Assist for transportation;Help with stairs or ramp for entrance;Direct supervision/assist for financial management;Direct supervision/assist for medications management    Functional Status Assessment  Patient has had a recent decline in their functional status and demonstrates the ability to make significant improvements in function in a reasonable and predictable amount of time.  Equipment Recommendations   BSC/3in1    Recommendations for Other Services       Precautions / Restrictions Precautions Precautions: Fall;Back Precaution Booklet Issued: Yes (comment) Precaution Comments: reviewed with pt, able to recall 2/3 Required Braces or Orthoses: Spinal Brace Spinal Brace: Thoracolumbosacral orthotic;Applied in sitting position Restrictions Weight Bearing Restrictions Per Provider Order: No      Mobility Bed Mobility               General bed mobility comments: in recliner upon entry    Transfers                   General transfer comment: deferred due to lethargy (likley from pain meds and fatigue from not sleeping overnight)      Balance Overall balance assessment: Needs assistance, Mild deficits observed, not formally tested Sitting-balance support: No upper extremity supported, Feet supported Sitting balance-Leahy Scale: Fair Sitting balance - Comments: min guard for safety, tends to lean posteriorly                                   ADL either performed or assessed with clinical judgement   ADL Overall ADL's : Needs assistance/impaired     Grooming: Minimal assistance;Sitting           Upper Body Dressing : Minimal assistance;Sitting   Lower Body Dressing: Minimal assistance;Sitting/lateral leans Lower Body Dressing Details (indicate cue type and reason): able to complete figure 4 technique, remains sitting due to lethargy   Toilet Transfer Details (indicate cue type and reason): deferred due to lethargy           General ADL Comments: remained sitting in char due to  lethargy     Vision   Vision Assessment?: No apparent visual deficits Additional Comments: but does requires cueing to keep eyes open this date     Perception         Praxis         Pertinent Vitals/Pain Pain Assessment Pain Assessment: Faces Faces Pain Scale: Hurts a little bit Pain Location: incision site Pain Descriptors / Indicators: Aching,  Discomfort Pain Intervention(s): Limited activity within patient's tolerance, Monitored during session, Repositioned     Extremity/Trunk Assessment Upper Extremity Assessment Upper Extremity Assessment: Generalized weakness (reports hx of chornic L shoulder issues and plan for replacement soon- unable to raise arm at shoulder)   Lower Extremity Assessment Lower Extremity Assessment: Defer to PT evaluation   Cervical / Trunk Assessment Cervical / Trunk Assessment: Back Surgery   Communication Communication Communication: No apparent difficulties   Cognition Arousal: Lethargic, Suspect due to medications Behavior During Therapy: Flat affect Overall Cognitive Status: Impaired/Different from baseline                                 General Comments: pt oriented and following simple commands, but requires cueing to maintain eyes open and tends to fall asleep if not fully engaged     General Comments  spouse at side, educated on TLSO on when OOB and how to don.  Spouse voicing frustration with care overnight, informed RN and publishing copy.    Exercises     Shoulder Instructions      Home Living Family/patient expects to be discharged to:: Private residence Living Arrangements: Spouse/significant other Available Help at Discharge: Family;Available 24 hours/day Type of Home: House Home Access: Stairs to enter [employer] of Steps: 4 Entrance Stairs-Rails: Right Home Layout: One level     Bathroom Shower/Tub: Producer, Television/film/video: Handicapped height Bathroom Accessibility: Yes How Accessible: Accessible via walker Home Equipment: Rolling Walker (2 wheels);Cane - single point;BSC/3in1;Crutches;Grab bars - tub/shower          Prior Functioning/Environment Prior Level of Function : Independent/Modified Independent             Mobility Comments: independent w/ no AD ADLs Comments: States she can perform ADLs herself but  sometimes asks her husband for help with tasks like buttoning her shirt. Reports completes  light IADLs, driving and manages meds using pill box        OT Problem List: Decreased strength;Decreased activity tolerance;Impaired balance (sitting and/or standing);Pain;Decreased knowledge of precautions;Decreased knowledge of use of DME or AE;Decreased safety awareness;Decreased cognition      OT Treatment/Interventions: Self-care/ADL training;Therapeutic exercise;DME and/or AE instruction;Energy conservation;Therapeutic activities;Cognitive remediation/compensation;Patient/family education;Balance training    OT Goals(Current goals can be found in the care plan section) Acute Rehab OT Goals Patient Stated Goal: home when ready OT Goal Formulation: With patient Time For Goal Achievement: 04/26/23 Potential to Achieve Goals: Good  OT Frequency: Min 1X/week    Co-evaluation              AM-PAC OT 6 Clicks Daily Activity     Outcome Measure Help from another person eating meals?: A Little Help from another person taking care of personal grooming?: A Little Help from another person toileting, which includes using toliet, bedpan, or urinal?: A Lot Help from another person bathing (including washing, rinsing, drying)?: A Little Help from another person to put on and taking off regular upper body clothing?: A Little Help  from another person to put on and taking off regular lower body clothing?: A Little 6 Click Score: 17   End of Session Nurse Communication: Mobility status  Activity Tolerance: Patient limited by lethargy Patient left: in chair;with call bell/phone within reach;with chair alarm set;with nursing/sitter in room  OT Visit Diagnosis: Other abnormalities of gait and mobility (R26.89);Muscle weakness (generalized) (M62.81);Pain Pain - part of body:  (back)                Time: 9057-9040 OT Time Calculation (min): 17 min Charges:  OT General Charges $OT Visit: 1 Visit OT  Evaluation $OT Eval Moderate Complexity: 1 Mod  Etta NOVAK, OT Acute Rehabilitation Services Office [phone]   Etta GORMAN Hope 04/12/2023, 12:11 PM

## 2023-04-12 NOTE — Progress Notes (Signed)
 Of note, I did have a telephone communication with the nurse.  When I saw the patient this morning she was doing very well and did great with her physical therapy and was walking to the bathroom independently.  She did have a rough night, but was feeling much better.  Unfortunately she was given a dose of IV Dilaudid  that caused quite a bit of sedation.  She was unable to get through her occupational therapy later and her husband had concerns about taking her home, understandably.  I did discuss with her nurse to discontinue the IV medications and simply stick to the PO medications.  We are going to give her some time and once the medication side effects wear off a bit she can retry her occupational therapy and proceed home later today.  We do feel that she is safe to go home today and we can discuss this additionally with her husband later.  We do feel she will be much more comfortable at home as she did have a rather rough evening yesterday.  We did discuss that there is no rush in getting her home that she can rest up and complete her therapy later and progress home later this evening.  Her nurse is very much on board and will contact us  with any additional issues moving forward.  The plan is still to proceed home today.  Gerhard Rappaport, PA-C [phone]

## 2023-04-12 NOTE — TOC Transition Note (Signed)
 Transition of Care [facility]) - Discharge Note   Patient Details  Name: Jessica Landry MRN: [id number] Date of Birth: 04-23-1955  [facility] CM/SW Contact:  Rosalva Jon Bloch, RN Phone Number: 04/12/2023, 3:58 PM   Clinical Narrative:     - s/p R sided transforaminal L4/L5 interbody fusion, L sided L4/L5 posterolateral fusion, L3/L4 posterolateral fusion d/t nonunion, insertion of interbody device on 1/2.   Patient will DC to: home Anticipated DC date: 04/11/2022 Family notified: yes, husband Transport by: car   Per MD patient ready for DC today after working with therapy. RN, patient, and  patient's husband aware of DC.  Pt without DME needs, already has RW AND BSC @ home. Post hospital f/u noted on AVS. Husband to provide transportation to home. Pt without RX med concerns. Meds to be picked up  from [facility] pharmacy.  RNCM will sign off for now as intervention is no longer needed. Please consult us  again if new needs arise.    Final next level of care: Home/Self Care     Patient Goals and CMS Choice            Discharge Placement                       Discharge Plan and Services Additional resources added to the After Visit Summary for                                       Social Drivers of Health (SDOH) Interventions SDOH Screenings   Food Insecurity: No Food Insecurity (04/11/2023)  Housing: Low Risk  (04/11/2023)  Transportation Needs: No Transportation Needs (04/11/2023)  Utilities: Not At Risk (04/11/2023)  Financial Resource Strain: Low Risk  (01/04/2023)   Received from [facility]  Physical Activity: Inactive (07/23/2018)  Social Connections: Socially Integrated (04/11/2023)  Stress: No Stress Concern Present (07/23/2018)  Tobacco Use: Medium Risk (04/11/2023)     Readmission Risk Interventions     No data to display

## 2023-04-12 NOTE — Progress Notes (Signed)
 Pt found crying and shaking. She stated she was in 12/10 pain and we failed to bring her medicine in. Pt never called and requested any pain medication, but insisted that the MD told her we would bring it in every few hours. Pt educated on PRN meds and reminded her of her last pain assessment score at 1400 was a 0. She requested the IV dilaudid , but I also had to remind her of how sedated she became earlier with that and encouraged her to attempt the PO meds first and see if there was enough relief. Also dicussed the conversation we had with the NP regarding using PO medications to ensure pain would be managed once d/c'd home tomorrow. Pt again began crying saying she was calling her husband to check out, because we were not properly taking care of her. This conversation regarding using PO medications first to avoid sedation was also had with husband earlier in the day. Pt given 10 mg PO Percocet and comforted pt and allowed her to express her feelings. Pain re-assessment will be performed to see if additional medication is needed.

## 2023-04-12 NOTE — Evaluation (Signed)
 Physical Therapy Evaluation Patient Details Name: Jessica Landry MRN: [id number] DOB: 1955-06-12 Today's Date: 04/12/2023  History of Present Illness  68 y.o. female presents to [facility] w/ R leg pain w/ MRI revealing severe stenosis at L4/5 above prior fusion. S/p R sided transforaminal L4/L5 interbody fusion, L sided L4/L5 posterolateral fusion, L3/L4 posterolateral fusion d/t nonunion, insertion of interbody device on 1/2. PMH significant for CAD s/p percutaneous coronary angioplasty, essential HTN, GERD, HLD, STEMI, back surgery, L and R knee surgeries.  Clinical Impression  Pt seated EOB with nursing upon arrival and agreeable to PT eval. Prior to admission, pt was independent w/ no AD for mobility. In today's session, pt was able to ambulate ~200 ft with RW and CGA for safety. Pt was able to ascend/descend 2 steps with step to gait pattern. Pt will have 24/7 physical assist available at home and feels comfortable with d/c home when medically stable. Pt presents to therapy session with decreased strength, balance, and mobility. Pt would benefit from acute skilled PT to address functional impairments. Recommending post-acute OP PT to work towards independence with mobility and to address chronic back pain. Acute PT to follow.          If plan is discharge home, recommend the following: A little help with walking and/or transfers;Help with stairs or ramp for entrance;Assist for transportation   Can travel by private vehicle    Yes    Equipment Recommendations None recommended by PT     Functional Status Assessment Patient has had a recent decline in their functional status and demonstrates the ability to make significant improvements in function in a reasonable and predictable amount of time.     Precautions / Restrictions Precautions Precautions: Fall;Back Precaution Booklet Issued: Yes (comment) Required Braces or Orthoses: Spinal Brace Spinal Brace: Thoracolumbosacral orthotic;Applied in  sitting position Restrictions Weight Bearing Restrictions Per Provider Order: No      Mobility  Bed Mobility  General bed mobility comments: seated EOB upon arrival    Transfers Overall transfer level: Needs assistance Equipment used: Rolling walker (2 wheels) Transfers: Sit to/from Stand Sit to Stand: Min assist        General transfer comment: MinA for boost up    Ambulation/Gait Ambulation/Gait assistance: Contact guard assist Gait Distance (Feet): 200 Feet Assistive device: Rolling walker (2 wheels) Gait Pattern/deviations: Step-through pattern Gait velocity: dec     General Gait Details: short and steady steps    04/12/23 0853  Ambulation/Gait  Stairs Yes  Stairs assistance Contact guard assist  Stair Management One rail Right;Sideways;Step to pattern;Forwards  Number of Stairs 2  General stair comments sideways step to pattern w/ B UE on rail, CGA for safety     Balance Overall balance assessment: Needs assistance, Mild deficits observed, not formally tested Sitting-balance support: No upper extremity supported, Feet supported Sitting balance-Leahy Scale: Good     Standing balance support: Bilateral upper extremity supported, During functional activity, Reliant on assistive device for balance Standing balance-Leahy Scale: Fair Standing balance comment: able to stand statically w/ no UE support, RW for gait       Pertinent Vitals/Pain Pain Assessment Pain Assessment: Faces Faces Pain Scale: Hurts a little bit Pain Location: incision site Pain Descriptors / Indicators: Aching, Discomfort Pain Intervention(s): Limited activity within patient's tolerance, Monitored during session, Repositioned    Home Living Family/patient expects to be discharged to:: Private residence Living Arrangements: Spouse/significant other Available Help at Discharge: Family;Available 24 hours/day Type of Home: House Home Access:  Stairs to enter Entrance Stairs-Rails:  Right Entrance Stairs-Number of Steps: 4   Home Layout: One level Home Equipment: Agricultural Consultant (2 wheels);Cane - single point;BSC/3in1;Crutches;Grab bars - tub/shower      Prior Function Prior Level of Function : Independent/Modified Independent    Mobility Comments: independent w/ no AD ADLs Comments: independent ADLs,     Extremity/Trunk Assessment   Upper Extremity Assessment Upper Extremity Assessment: Defer to OT evaluation    Lower Extremity Assessment Lower Extremity Assessment: Generalized weakness    Cervical / Trunk Assessment Cervical / Trunk Assessment: Back Surgery  Communication   Communication Communication: No apparent difficulties  Cognition Arousal: Alert Behavior During Therapy: WFL for tasks assessed/performed Overall Cognitive Status: Within Functional Limits for tasks assessed       General Comments General comments (skin integrity, edema, etc.): VSS on RA, TLSO donned in sitting     PT Assessment Patient needs continued PT services  PT Problem List Decreased strength;Decreased activity tolerance;Decreased mobility;Decreased balance       PT Treatment Interventions DME instruction;Gait training;Stair training;Therapeutic activities;Functional mobility training;Therapeutic exercise;Balance training;Neuromuscular re-education;Patient/family education    PT Goals (Current goals can be found in the Care Plan section)  Acute Rehab PT Goals Patient Stated Goal: to go home PT Goal Formulation: With patient Time For Goal Achievement: 04/26/23 Potential to Achieve Goals: Good    Frequency Min 1X/week        AM-PAC PT 6 Clicks Mobility  Outcome Measure Help needed turning from your back to your side while in a flat bed without using bedrails?: A Little Help needed moving from lying on your back to sitting on the side of a flat bed without using bedrails?: A Little Help needed moving to and from a bed to a chair (including a wheelchair)?: A  Little Help needed standing up from a chair using your arms (e.g., wheelchair or bedside chair)?: A Little Help needed to walk in hospital room?: A Little Help needed climbing 3-5 steps with a railing? : A Little 6 Click Score: 18    End of Session   Activity Tolerance: Patient tolerated treatment well Patient left: in chair;with call bell/phone within reach;with chair alarm set Nurse Communication: Mobility status PT Visit Diagnosis: Muscle weakness (generalized) (M62.81)    Time: 9193-9166 PT Time Calculation (min) (ACUTE ONLY): 27 min   Charges:   PT Evaluation $PT Eval Low Complexity: 1 Low PT Treatments $Therapeutic Activity: 8-22 mins PT General Charges $$ ACUTE PT VISIT: 1 Visit         Kate ORN, PT, DPT Secure Chat Preferred  Rehab Office [phone]   Kate BRAVO Wendolyn 04/12/2023, 9:45 AM

## 2023-04-13 ENCOUNTER — Other Ambulatory Visit (HOSPITAL_COMMUNITY): Payer: Self-pay

## 2023-04-13 DIAGNOSIS — M5416 Radiculopathy, lumbar region: Secondary | ICD-10-CM | POA: Diagnosis not present

## 2023-04-13 DIAGNOSIS — Z79899 Other long term (current) drug therapy: Secondary | ICD-10-CM | POA: Diagnosis not present

## 2023-04-13 DIAGNOSIS — M48061 Spinal stenosis, lumbar region without neurogenic claudication: Secondary | ICD-10-CM | POA: Diagnosis not present

## 2023-04-13 DIAGNOSIS — I2510 Atherosclerotic heart disease of native coronary artery without angina pectoris: Secondary | ICD-10-CM | POA: Diagnosis not present

## 2023-04-13 DIAGNOSIS — Z955 Presence of coronary angioplasty implant and graft: Secondary | ICD-10-CM | POA: Diagnosis not present

## 2023-04-13 DIAGNOSIS — Z87891 Personal history of nicotine dependence: Secondary | ICD-10-CM | POA: Diagnosis not present

## 2023-04-13 DIAGNOSIS — I10 Essential (primary) hypertension: Secondary | ICD-10-CM | POA: Diagnosis not present

## 2023-04-13 DIAGNOSIS — Z96653 Presence of artificial knee joint, bilateral: Secondary | ICD-10-CM | POA: Diagnosis not present

## 2023-04-13 NOTE — Progress Notes (Signed)
 PT Cancellation Note  Patient Details Name: Jessica Landry MRN: [id number] DOB: Jun 30, 1955   Cancelled Treatment:    Reason Eval/Treat Not Completed: (P) Patient declined, no reason specified, pt declining mobility stating she is leaving soon as is without concerns, reviewed education on all precautions and brace wear with pt verbalizing understanding. Will continue to follow acutely.   Therisa SAUNDERS. PTA Acute Rehabilitation Services Office: [phone]    Therisa CHRISTELLA Boor 04/13/2023, 9:39 AM

## 2023-04-13 NOTE — Progress Notes (Signed)
 Subjective: 2 Days Post-Op Procedure(s) (LRB): RIGHT-SIDED LUMBAR 4 - LUMBAR 5 TRANSFORAMINAL LUMBAR INTERBODY FUSION AND DECOMPRESSION WITH INSTRUMENTATION AND ALLOGRAFT (Right) Patient reports pain as mild.    Objective: Vital signs in last 24 hours: Temp:  [98 F (36.7 C)-99.2 F (37.3 C)] 98.9 F (37.2 C) (01/04 0534) Pulse Rate:  [69-80] 80 (01/04 0534) Resp:  [16-18] 18 (01/04 0534) BP: (96-108)/(47-72) 108/60 (01/04 0534) SpO2:  [96 %-99 %] 99 % (01/04 0534)  Intake/Output from previous day: 01/03 0701 - 01/04 0700 In: 220 [P.O.:220] Out: -  Intake/Output this shift: No intake/output data recorded.  No results for input(s): HGB in the last 72 hours. No results for input(s): WBC, RBC, HCT, PLT in the last 72 hours. No results for input(s): NA, K, CL, CO2, BUN, CREATININE, GLUCOSE, CALCIUM  in the last 72 hours. No results for input(s): LABPT, INR in the last 72 hours.  Neurologically intact ABD soft Neurovascular intact Sensation intact distally Intact pulses distally Dorsiflexion/Plantar flexion intact Incision: dressing C/D/I No cellulitis present   Assessment/Plan: 2 Days Post-Op Procedure(s) (LRB): RIGHT-SIDED LUMBAR 4 - LUMBAR 5 TRANSFORAMINAL LUMBAR INTERBODY FUSION AND DECOMPRESSION WITH INSTRUMENTATION AND ALLOGRAFT (Right)  Jessica Landry did well overnight.  Pain was well-controlled.  She is ANO x 3 today.  She is eating without difficulty.  She is able to ambulate.  She is neurologically intact distal to her surgery site.  Will plan for discharge home today.  Postop meds have been ordered for her to pick up her outpatient pharmacy.  She will see Dr. Beuford as scheduled for postoperative follow-up.    Patient's anticipated LOS is less than 2 midnights, meeting these requirements: - Younger than 80 - Lives within 1 hour of care - Has a competent adult at home to recover with post-op recover - NO history of  - Chronic pain  requiring opiods  - Diabetes  - Coronary Artery Disease  - Heart failure  - Heart attack  - Stroke  - DVT/VTE  - Cardiac arrhythmia  - Respiratory Failure/COPD  - Renal failure  - Anemia  - Advanced Liver disease     Jessica Landry 04/13/2023, 8:36 AM

## 2023-04-13 NOTE — Discharge Summary (Signed)
 Patient ID: Jessica Landry MRN: [id number] DOB/AGE: 1956-02-21 68 y.o.  Admit date: 04/11/2023 Discharge date: 04/13/2023  Admission Diagnoses:  Principal Problem: Lumbar Radiculopathy   Discharge Diagnoses:  Same  Past Medical History:  Diagnosis Date   Anxiety    Arthritis    knees & back    CAD S/P percutaneous coronary angioplasty 03/07/2011   a) PCI of mRCA with Promus Element DES 3.5 mm x 28 mm (4.2 -- 3.7 mm); b) Myoview  04/2011: EF 81% (small LV Cavity), fixed basal-mid Inferior Infarct, No Ischemia, 10 METS   Complication of anesthesia    reported prolonged emergence   Depression 03/08/2011   Dr. Corinthia- [place]   Essential hypertension 03/07/2011   Family history of adverse reaction to anesthesia    GERD (gastroesophageal reflux disease)    Headache    chocolate & red wine triggers /w bad headaches    History of back surgery    Hyperlipidemia with target LDL less than 70 04/09/2010   Statin Intolerance (tried Lipitor & Crestor )   Insomnia    without significant sleeping disorders   Iron  deficiency anemia 03/09/2011   Neuropathy    PONV (postoperative nausea and vomiting)    ST-segment elevation myocardial infarction (STEMI) of inferior wall (HCC) 03/07/2011   RCA Occlusion --> PCI of mRCA; ECHO 03/2011: EF > 55%, MIld Inferior HK, Mild AoV Sclerosis.   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 female who was admitted 04/11/2023 for operative treatment ofRadiculopathy. Patient has severe unremitting pain that affects sleep, daily activities, and work/hobbies. After pre-op clearance the patient was taken to the operating room on 04/11/2023 and underwent  Procedure(s): RIGHT-SIDED LUMBAR 4 - LUMBAR 5 TRANSFORAMINAL  LUMBAR INTERBODY FUSION AND DECOMPRESSION WITH INSTRUMENTATION AND ALLOGRAFT.    Patient was given perioperative antibiotics:  Anti-infectives (From admission, onward)    Start     Dose/Rate Route Frequency Ordered Stop   04/12/23 1000  doxycycline  (VIBRA -TABS) tablet 100 mg        100 mg Oral Daily 04/11/23 1227     04/11/23 1400  ceFAZolin  (ANCEF ) IVPB 2g/100 mL premix        2 g 200 mL/hr over 30 Minutes Intravenous Every 8 hours 04/11/23 1227 04/12/23 0048   04/11/23 0600  ceFAZolin  (ANCEF ) IVPB 2g/100 mL premix        2 g 200 mL/hr over 30 Minutes Intravenous On call to O.R. 04/11/23 9450 04/11/23 9173        Patient was given sequential compression devices, early ambulation, and chemoprophylaxis to prevent DVT.  Patient benefited maximally from hospital stay and there were no complications.    Recent vital signs: Patient Vitals for the past 24 hrs:  BP Temp Temp src Pulse Resp SpO2  04/13/23 0838 (!) 110/58 98.8 F (37.1 C) -- 84 20 97 %  04/13/23 0534 108/60 98.9 F (37.2 C) Oral 80 18 99 %  04/12/23 2205 (!) 97/51 -- -- 73 -- --  04/12/23 2046 (!) 96/50 99.2 F (37.3 C) Oral 77 16 96 %  04/12/23 1613 (!) 99/47 98 F (36.7 C) -- 79 16 98 %     Recent laboratory studies: No results for input(s): WBC, HGB, HCT, PLT, NA, K, CL, CO2, BUN, CREATININE, GLUCOSE, INR, CALCIUM   in the last 72 hours.  Invalid input(s): PT, 2   Discharge Medications:   Allergies as of 04/13/2023       Reactions   Bee Venom Anaphylaxis   Statins Other (See Comments)   Myalgias & fatigue   Etodolac    Prednisone Palpitations, Other (See Comments)   Can take small amounts. HR increase; increased energy   Sulfa Antibiotics Other (See Comments)   Elevated B/P, skin turns red        Medication List     STOP taking these medications    acetaminophen -codeine 300-30 MG tablet Commonly known as: TYLENOL  #3       TAKE these medications    acetaminophen   500 MG tablet Commonly known as: TYLENOL  Take 500-1,000 mg by mouth every 6 (six) hours as needed for moderate pain (pain score 4-6).   benazepril  10 MG tablet Commonly known as: LOTENSIN  TAKE 1 TABLET EVERY MORNING   buPROPion  300 MG 24 hr tablet Commonly known as: WELLBUTRIN  XL TAKE 1 TABLET EVERY MORNING   doxycycline  100 MG tablet Commonly known as: VIBRA -TABS Take 100 mg by mouth daily.   FLUoxetine  40 MG capsule Commonly known as: PROZAC  TAKE 1 CAPSULE EVERY DAY BEFORE BREAKFAST  WITH  10MG   CAP   FLUoxetine  10 MG capsule Commonly known as: PROZAC  TAKE 1 CAPSULE EVERY DAY  WITH  40MG    gabapentin  300 MG capsule Commonly known as: NEURONTIN  Take 2 capsules (600 mg total) by mouth at bedtime.   methocarbamol  500 MG tablet Commonly known as: ROBAXIN  Take 1-2 tablets (500-1,000 mg total) by mouth every 6 (six) hours as needed for muscle spasms.   metoprolol  tartrate 50 MG tablet Commonly known as: LOPRESSOR  TAKE 1 TABLET TWICE DAILY   nitroGLYCERIN  0.4 MG SL tablet Commonly known as: NITROSTAT  Place 1 tablet (0.4 mg total) under the tongue every 5 (five) minutes as needed for chest pain (up to 3 tabs in 15 mins and then call 911).   omeprazole  20 MG capsule Commonly known as: PRILOSEC  TAKE 1 CAPSULE(20 MG) BY MOUTH DAILY   oxyCODONE -acetaminophen  5-325 MG tablet Commonly known as: Percocet Take 1-2 tablets by mouth every 6 (six) hours as needed for up to 7 days for severe pain (pain score 7-10). What changed: when to take this   Repatha  SureClick 140 MG/ML Soaj Generic drug: Evolocumab  ADMINISTER 1 ML(140 MG) UNDER THE SKIN EVERY 2 WEEKS        Diagnostic Studies: DG Lumbar Spine 2-3 Views Result Date: 04/11/2023 CLINICAL DATA:  Elective surgery. EXAM: LUMBAR SPINE - 2-3 VIEW COMPARISON:  Preoperative CT 03/19/2023 FINDINGS: Two fluoroscopic spot views of the lumbar spine obtained in the operating room. The previous L3-L4 fusion hardware now extends to the  L5 level with a new L4-L5 interbody spacer. Fluoroscopy time 32 seconds. Dose 17.9 mGy. IMPRESSION: Intraoperative fluoroscopy during lumbar fusion. Electronically Signed   By: Andrea Gasman M.D.   On: 04/11/2023 11:02   DG Lumbar Spine 1 View Result Date: 04/11/2023 CLINICAL DATA:  [id number] Surgery, elective [id number] EXAM: LUMBAR SPINE - 1 VIEW COMPARISON:  CT 03/19/2023 FINDINGS: Portable cross-table lateral view lumbar spine obtained in the operating room. Posterior rod with intrapedicular screw fusion present at L3-L4. Surgical instruments localize posteriorly at the upper aspect of the hardware and overlying the sacrum. IMPRESSION: Intraoperative localization during lumbar spine surgery. Electronically Signed   By: Andrea Gasman M.D.   On: 04/11/2023 11:01   DG C-Arm 1-60 Min-No Report Result Date: 04/11/2023  Fluoroscopy was utilized by the requesting physician.  No radiographic interpretation.   DG C-Arm 1-60 Min-No Report Result Date: 04/11/2023 Fluoroscopy was utilized by the requesting physician.  No radiographic interpretation.   CT LUMBAR SPINE WO CONTRAST Result Date: 04/02/2023 CLINICAL DATA:  Low back pain with radiculopathy. EXAM: CT LUMBAR SPINE WITHOUT CONTRAST TECHNIQUE: Multidetector CT imaging of the lumbar spine was performed without intravenous contrast administration. Multiplanar CT image reconstructions were also generated. RADIATION DOSE REDUCTION: This exam was performed according to the departmental dose-optimization program which includes automated exposure control, adjustment of the mA and/or kV according to patient size and/or use of iterative reconstruction technique. COMPARISON:  MRI lumbar spine 01/28/2023 at [facility]. FINDINGS: Segmentation: 5 non rib-bearing lumbar type vertebral bodies are present. The lowest fully formed vertebral body is L5. Alignment: 2 mm anterolisthesis is again noted at L2-3. Previously noted listhesis at L4-5 was reduced. Rightward curvature is  centered at L1. Leftward curvature is centered at L4. Vertebrae: Chronic endplate sclerotic changes are present on the right at L4-5 and on the left at L1-2. Vertebral body heights are maintained. No acute or healing fractures are present. No other osseous lesions are present. Paraspinal and other soft tissues: Atherosclerotic calcifications are present within the aorta branch vessels. No solid organ lesions are present. No significant adenopathy is present. Disc levels: T12-L1: Mild facet hypertrophy is present. No focal disc protrusion or stenosis is present. L1-2: A leftward disc protrusion is present. Moderate facet hypertrophy is worse on the left. Mild left subarticular and foraminal stenosis is stable. L2-3: A broad-based disc protrusion present. Moderate facet hypertrophy is present bilaterally. Mild subarticular narrowing is present bilaterally. Moderate left foraminal stenosis is stable. L3-4: Left laminectomy is present. Discectomy and interbody fusion is noted. The pedicle screws are intact. Bridging bone is present across the disc space on the left. L4-5: Bilateral laminectomies are noted. The posterior canal is decompressed. Mild left subarticular narrowing is stable. Moderate right and mild left foraminal stenosis is present. L5-S1: A shallow central disc protrusion is present. Bone spurring extends into the left neural foramen. The central canal is patent. Moderate left and mild right foraminal stenosis is present. IMPRESSION: 1. Left laminectomy and interbody fusion at L3-4 with bridging bone across the disc space on the left. 2. Bilateral laminectomies at L4-5 with decompression of the posterior canal. Mild left subarticular narrowing is stable. 3. Moderate right and mild left foraminal stenosis at L4-5. 4. Moderate left and mild right foraminal stenosis at L5-S1. 5. Mild left subarticular and foraminal stenosis at L1-2. 6. Mild subarticular narrowing bilaterally at L2-3. 7.  Aortic Atherosclerosis  (ICD10-I70.0). Electronically Signed   By: Lonni Necessary M.D.   On: 04/02/2023 08:21    Disposition: Discharge disposition: 01-Home or Self Care       Discharge Instructions     Call MD for:  difficulty breathing, headache or visual disturbances   Complete by: As directed    Call MD for:  persistant nausea and vomiting   Complete by: As directed    Call MD for:  severe uncontrolled pain   Complete by: As directed    Call MD for:  temperature >100.4   Complete by: As directed    Diet - low sodium heart healthy   Complete by: As directed    Discharge instructions   Complete by: As directed    Follow up with [facility] Acute Care Rehab (Rehabilitation); Physical and Occupational therapy. Office will call to follow  up after hospital discharge.   Discharge patient   Complete by: As directed    Post PT   Discharge disposition: 01-Home or Self Care   Discharge patient date: 04/12/2023   Driving Restrictions   Complete by: As directed    No driving while taking narcotic pain medicine   Increase activity slowly   Complete by: As directed    Lifting restrictions   Complete by: As directed    Lifting restriction as discussed with Dr. Beuford and PA Ileana McKinzie        Follow-up Information     [facility] Rehab Follow up.   Specialty: Rehabilitation Why: Physical and Occupational therapy. Office will call to follow up after hospital discharge. Contact information: [address] [place]  [address] [phone]                 Signed: Ozell FORBES [facility] 04/13/2023, 8:51 AM

## 2023-04-13 NOTE — Discharge Planning (Signed)
 Patient alert. IV access removed. Dressing changed per MD request. Discharge teaching given to Lauraine Server and guardian Shaquanta Harkless. Verbalized understanding of teaching and medications. Discharge summary placed in discharge packet. Patient will be transported home via personal vehicle with Marcey Server.

## 2023-04-13 NOTE — Progress Notes (Signed)
 No legal guardian per patient

## 2023-04-13 NOTE — TOC Transition Note (Signed)
 Transition of Care [facility]) - Discharge Note   Patient Details  Name: Jessica Landry MRN: [id number] Date of Birth: 09-18-1955  [facility] CM/SW Contact:  Robynn Eileen Hoose, RN Phone Number: 04/13/2023, 8:07 AM   Clinical Narrative:    Patient being discharged, outpatient PT/OT referral # [id number] for [facility] outpatient rehab created. Contact information on AVS.   Final next level of care: Home/Self Care     Patient Goals and CMS Choice            Discharge Placement                       Discharge Plan and Services Additional resources added to the After Visit Summary for                                       Social Drivers of Health (SDOH) Interventions SDOH Screenings   Food Insecurity: No Food Insecurity (04/11/2023)  Housing: Low Risk  (04/11/2023)  Transportation Needs: No Transportation Needs (04/11/2023)  Utilities: Not At Risk (04/11/2023)  Financial Resource Strain: Low Risk  (01/04/2023)   Received from [facility]  Physical Activity: Inactive (07/23/2018)  Social Connections: Socially Integrated (04/11/2023)  Stress: No Stress Concern Present (07/23/2018)  Tobacco Use: Medium Risk (04/11/2023)     Readmission Risk Interventions     No data to display

## 2023-04-18 ENCOUNTER — Encounter (HOSPITAL_COMMUNITY): Payer: Self-pay | Admitting: Orthopedic Surgery

## 2023-04-18 NOTE — Addendum Note (Signed)
 Addendum  created 04/18/23 0933 by Trevor Iha, MD   Intraprocedure Event edited, Intraprocedure Staff edited

## 2023-04-30 NOTE — Telephone Encounter (Signed)
Patient is following up requesting a call back regarding this matter.

## 2023-04-30 NOTE — Telephone Encounter (Signed)
Re-enrolled in [employer]  Card No. [id number]  BIN [id number]  PCN [id number]  PC Group [id number]

## 2023-05-03 NOTE — Telephone Encounter (Signed)
Spoke to pt, provided old co-pay card info as it is still good until 05/16/2023 and has balance of ~$500.

## 2023-05-21 ENCOUNTER — Other Ambulatory Visit: Payer: Self-pay | Admitting: Cardiology

## 2023-05-24 DIAGNOSIS — M5416 Radiculopathy, lumbar region: Secondary | ICD-10-CM | POA: Diagnosis not present

## 2023-05-25 ENCOUNTER — Other Ambulatory Visit: Payer: Self-pay | Admitting: Cardiology

## 2023-05-31 ENCOUNTER — Encounter: Payer: Self-pay | Admitting: Cardiology

## 2023-05-31 ENCOUNTER — Ambulatory Visit: Payer: Medicare HMO | Attending: Cardiology | Admitting: Cardiology

## 2023-05-31 VITALS — BP 116/84 | HR 59 | Ht 67.0 in | Wt 151.6 lb

## 2023-05-31 DIAGNOSIS — I10 Essential (primary) hypertension: Secondary | ICD-10-CM

## 2023-05-31 DIAGNOSIS — M791 Myalgia, unspecified site: Secondary | ICD-10-CM

## 2023-05-31 DIAGNOSIS — E785 Hyperlipidemia, unspecified: Secondary | ICD-10-CM | POA: Diagnosis not present

## 2023-05-31 DIAGNOSIS — Z9861 Coronary angioplasty status: Secondary | ICD-10-CM

## 2023-05-31 DIAGNOSIS — D692 Other nonthrombocytopenic purpura: Secondary | ICD-10-CM | POA: Diagnosis not present

## 2023-05-31 DIAGNOSIS — Z9889 Other specified postprocedural states: Secondary | ICD-10-CM | POA: Diagnosis not present

## 2023-05-31 DIAGNOSIS — I2119 ST elevation (STEMI) myocardial infarction involving other coronary artery of inferior wall: Secondary | ICD-10-CM

## 2023-05-31 DIAGNOSIS — I700 Atherosclerosis of aorta: Secondary | ICD-10-CM

## 2023-05-31 DIAGNOSIS — T466X5A Adverse effect of antihyperlipidemic and antiarteriosclerotic drugs, initial encounter: Secondary | ICD-10-CM

## 2023-05-31 DIAGNOSIS — T466X5D Adverse effect of antihyperlipidemic and antiarteriosclerotic drugs, subsequent encounter: Secondary | ICD-10-CM

## 2023-05-31 DIAGNOSIS — I2510 Atherosclerotic heart disease of native coronary artery without angina pectoris: Secondary | ICD-10-CM

## 2023-05-31 MED ORDER — ASPIRIN 81 MG PO TBEC: DELAYED_RELEASE_TABLET | ORAL | Status: AC

## 2023-05-31 NOTE — Progress Notes (Signed)
 Cardiology Office Note:  .   Date:  06/02/2023  ID:  Jessica Landry, DOB 12/17/1955, MRN [id number] PCP: Lauro Regulus, MD  [facility] Providers Cardiologist:  Bryan Lemma, MD     Chief Complaint  Patient presents with   Follow-up    1 year   Coronary Artery Disease    No active angina    Patient Profile: .     Jessica Landry is a 68 y.o. female former smoker with a PMH notable for CAD-Inferior STEMI/RCA PCI, HTN and HLD (on Repatha) who presents here for annual follow-up at the request of Lauro Regulus, MD.  CAD: Inferior STEMI with RCA PCI (November 2012)    Jessica Landry was last seen on May 18, 2022-doing quite well.  No major issues had not had her back surgery was feeling much better.  Much less pain.  Negative cardiac review of symptoms.  Still anxious.  No major changes made.  She went to the ER for left axillary pain in July 2024. Underwent L4-L5 transformation surgery on January 2.  Required transfusion.  Subjective  Discussed the use of AI scribe software for clinical note transcription with the patient, who gave verbal consent to proceed.  History of Present Illness   Jessica Landry is a 68 year old female with coronary artery disease who presents for a follow-up visit.  She is 12 years post-myocardial infarction with no recent episodes of chest pain, pressure, or tightness. No shortness of breath, dizziness, or syncope. Previously experienced occasional palpitations but not recently. She is not currently taking aspirin but is on Repatha for cholesterol management. Her last cholesterol check was in September with an LDL of 64 and total cholesterol of 161.  She has a history of aortic atherosclerosis with calcification noted on a CT scan. She reports no claudication symptoms such as pain in the calves or thighs when walking. She quit smoking and maintains good cholesterol levels with medication.  She underwent back surgery on  April 11, 2023, for scoliosis, with vertebrae three, four, five, and six fused. She reports significant improvement in posture and no longer experiences pain, which was previously a major issue.  Her husband recently had a foot amputation due to diabetes-related infection and osteomyelitis. She is currently administering IV antibiotics to him at home three times a day. She was scheduled for shoulder replacement surgery, but it was postponed due to her husband's medical needs.     Cardiovascular ROS: no chest pain or dyspnea on exertion positive for - Slowly getting back into exercise still has some deconditioning but is already feeling much better. negative for - edema, irregular heartbeat, orthopnea, palpitations, paroxysmal nocturnal dyspnea, rapid heart rate, shortness of breath, or Lightheadedness dizziness or weakness, syncope or near skin, TIA or amaurosis fugax, claudication  ROS:  Review of Systems - Negative except recovering from back surgery -- doing well     Objective   Medications - Repatha 140 mg Inj q 2 wk - Aspirin 81 mg (NOT taking) - Benazepril 10 mg daily - Lopressor (metoprolol tartrate) 50 mg BID - Omeprazole 20 mg daily - Bupropion XL 300 mg q am - Fluoxetine 40 mg + 10 mg - total 50 mg daily - Gabapentin 300 mg - 2 cap qhs - PRN methocarbamol 500 mg 1-2-tab q 6 hr PRN  Studies Reviewed: Marland Kitchen   EKG Interpretation Date/Time:  Friday May 31 2023 09:33:28 EST Ventricular Rate:  59 PR Interval:  184  QRS Duration:  82 QT Interval:  428 QTC Calculation: 423 R Axis:   20  Text Interpretation: Sinus bradycardia When compared with ECG of 07-Nov-2022 02:33, No significant change was found Confirmed by Bryan Lemma ([id number]) on 05/31/2023 9:44:03 AM    No recent studies Labs from [facility]where:  12/28/2022: TC 161, TG 57, HDL 86, LDL 64.;  NA 141, X9.1, Cl 103, CO2 31, BUN 14, Cr 0.9, Glu 92,Ca 9.2, AST 15, ALT 10, AP 73  Risk Assessment/Calculations:          Physical Exam:   VS:  BP 116/84 (BP Location: Left Arm, Patient Position: Sitting, Cuff Size: Normal)   Pulse (!) 59   Ht 5\' 7"  (1.702 m)   Wt 151 lb 9.6 oz (68.8 kg)   BMI 23.74 kg/m    Wt Readings from Last 3 Encounters:  05/31/23 151 lb 9.6 oz (68.8 kg)  04/11/23 153 lb 14.1 oz (69.8 kg)  03/29/23 154 lb (69.9 kg)    GEN: Well nourished, well groomed in no acute distress; healthy appearing NECK: No JVD; No carotid bruits-soft radiated aortic murmur. CARDIAC: RRR, Normal S1, S2; soft 1/6 SEM at RUSB, no rubs, gallops RESPIRATORY:  Clear to auscultation without rales, wheezing or rhonchi ; nonlabored, good air movement. ABDOMEN: Soft, non-tender, non-distended EXTREMITIES:  No edema; No deformity ; easy bruising    ASSESSMENT AND PLAN: .    Problem List Items Addressed This Visit       Cardiology Problems   Aortoiliac atherosclerosis (HCC) (Chronic)   Calcification noted on CT scan, asymptomatic with no claudication. -Continue current management with cholesterol control and blood pressure management.      Relevant Medications   aspirin EC 81 MG tablet   CAD S/P PCI mRCA: Promus Element DES 3.5 mm x 28 mm (4.2-37 mm)) - Primary (Chronic)   12 years post inferior STEMI (RCA PCI), she remains asymptomatic with no chest pain, shortness of breath, or dizziness.  Is on stable dose of Lopressor (metoprolol tartrate) 50 mg twice daily and Lotensin benazepril) 10 mg daily along with Repatha 140 mg injection every 2 weeks.  She is not on aspirin however. -Continue current management with Repatha for cholesterol control, along with Lopressor and benazepril for heart/BP control. -Resume Aspirin 81mg  at least 4 days a week for antiplatelet therapy.      Relevant Medications   aspirin EC 81 MG tablet   Essential hypertension (Chronic)   Blood pressure well-controlled on current doses of Lotensin/benazepril 10 mg daily at Lopressor/metoprolol tartrate 50 mg twice daily -Continue  current doses of benazepril and Toprol tartrate      Relevant Medications   aspirin EC 81 MG tablet   Other Relevant Orders   EKG 12-Lead (Completed)   Hyperlipidemia with target low density lipoprotein (LDL) cholesterol less than 55 mg/dL (Chronic)   Most recent LDL as of 10/27/2022 on Repatha was 64.   -Continue Repatha 140 mg injections every 2 weeks As long as it stays <70, would probably not add Zetia because of her tendency to be intolerant of medications.      Relevant Medications   aspirin EC 81 MG tablet   Senile purpura (HCC) (Chronic)   She had significant bruising on antiplatelet agent in the past, however I would like for her to be aspirin if only taking it for 5 days a week. -Restart aspirin 81 mg start with daily okay to reduce to 5 days a week.  Relevant Medications   aspirin EC 81 MG tablet   ST elevation myocardial infarction (STEMI) of inferior wall, subsequent episode of care [facility]) (Chronic)   12 years out from her inferior STEMI with no recurrent angina or heart failure symptoms.  On stable regimen.      Relevant Medications   aspirin EC 81 MG tablet     Other   Myalgia due to statin (Chronic)   Did not tolerate multiple different statins including simvastatin, atorvastatin and rosuvastatin.  Was started on Repatha several years ago and is doing quite well with well-controlled lipids.      Previous back surgery (Chronic)   Has not had yet again another back surgery. Now feeling much better with notably improved pain.  Improve gait and posture. -No further intervention required at this time.        General Health Maintenance -Annual follow-up appointments, consider [place] location for availability.Return in about 1 year (around 05/30/2024) for Routine follow up with me.  I spent 42 minutes in the care of Jessica Landry today including reviewing outside labs from [facility], checked by PCP (1 minutes), reviewing studies (2 minutes review  previous Stress Test Results), face to face time discussing treatment options (29 minutes), reviewing records from previous notes, hospitalizations for back surgery and ER visit   (3 minutes), 7 minutes dictating, and documenting in the encounter.     Signed, Marykay Lex, MD, MS Bryan Lemma, M.D., M.S. Interventional Cardiologist  [facility]  Pager # [phone] Phone # [phone] [address] [place], [place] [address]

## 2023-05-31 NOTE — Patient Instructions (Signed)
Medication Instructions:    Aspirin 81 mg  at least 4 times  a week  *If you need a refill on your cardiac medications before your next appointment, please call your pharmacy*   Lab Work:  Not needed   Testing/Procedures:  Not needed  Follow-Up: At [facility], you and your health needs are our priority.  As part of our continuing mission to provide you with exceptional heart care, we have created designated Provider Care Teams.  These Care Teams include your primary Cardiologist (physician) and Advanced Practice Providers (APPs -  Physician Assistants and Nurse Practitioners) who all work together to provide you with the care you need, when you need it.     Your next appointment:   12 month(s)  The format for your next appointment:   In Person  Provider:   Bryan Lemma, MD

## 2023-06-02 ENCOUNTER — Encounter: Payer: Self-pay | Admitting: Cardiology

## 2023-06-02 DIAGNOSIS — I700 Atherosclerosis of aorta: Secondary | ICD-10-CM | POA: Insufficient documentation

## 2023-06-02 NOTE — Assessment & Plan Note (Signed)
 Did not tolerate multiple different statins including simvastatin, atorvastatin and rosuvastatin.  Was started on Repatha several years ago and is doing quite well with well-controlled lipids.

## 2023-06-02 NOTE — Assessment & Plan Note (Signed)
 Most recent LDL as of 10/27/2022 on Repatha was 64.   -Continue Repatha 140 mg injections every 2 weeks As long as it stays <70, would probably not add Zetia because of her tendency to be intolerant of medications.

## 2023-06-02 NOTE — Assessment & Plan Note (Signed)
 12 years post inferior STEMI (RCA PCI), she remains asymptomatic with no chest pain, shortness of breath, or dizziness.  Is on stable dose of Lopressor (metoprolol tartrate) 50 mg twice daily and Lotensin benazepril) 10 mg daily along with Repatha 140 mg injection every 2 weeks.  She is not on aspirin however. -Continue current management with Repatha for cholesterol control, along with Lopressor and benazepril for heart/BP control. -Resume Aspirin 81mg  at least 4 days a week for antiplatelet therapy.

## 2023-06-02 NOTE — Assessment & Plan Note (Signed)
 12 years out from her inferior STEMI with no recurrent angina or heart failure symptoms.  On stable regimen.

## 2023-06-02 NOTE — Assessment & Plan Note (Signed)
 Has not had yet again another back surgery. Now feeling much better with notably improved pain.  Improve gait and posture. -No further intervention required at this time.

## 2023-06-02 NOTE — Assessment & Plan Note (Signed)
 Blood pressure well-controlled on current doses of Lotensin/benazepril 10 mg daily at Lopressor/metoprolol tartrate 50 mg twice daily -Continue current doses of benazepril and Toprol tartrate

## 2023-06-02 NOTE — Assessment & Plan Note (Signed)
 Calcification noted on CT scan, asymptomatic with no claudication. -Continue current management with cholesterol control and blood pressure management.

## 2023-06-02 NOTE — Assessment & Plan Note (Signed)
 She had significant bruising on antiplatelet agent in the past, however I would like for her to be aspirin if only taking it for 5 days a week. -Restart aspirin 81 mg start with daily okay to reduce to 5 days a week.

## 2023-06-12 DIAGNOSIS — M25512 Pain in left shoulder: Secondary | ICD-10-CM | POA: Diagnosis not present

## 2023-06-18 ENCOUNTER — Other Ambulatory Visit: Payer: Self-pay | Admitting: Cardiology

## 2023-06-19 ENCOUNTER — Telehealth: Payer: Self-pay | Admitting: Cardiology

## 2023-06-19 NOTE — Telephone Encounter (Signed)
   Pre-operative Risk Assessment    Patient Name: Jessica Landry  DOB: Oct 20, 1955 MRN: [id number]   Date of last office visit: 05/31/2023 Date of next office visit: none   Request for Surgical Clearance    Procedure:   left total shoulder replacement  Date of Surgery:  Clearance TBD                                Surgeon:  Dr. Eulas Post Surgeon's Group or Practice Name:  [facility] Phone number:  [phone]  Silvestre Mesi  Fax number:  [phone]   Type of Clearance Requested:   - Medical  - Pharmacy:  Hold Aspirin need instruction   Type of Anesthesia:  General  with interscalene block   Additional requests/questions:    SignedRoyann Shivers   06/19/2023, 9:28 AM

## 2023-06-19 NOTE — Telephone Encounter (Signed)
 Ok to hold 5-7 d  [facility]

## 2023-06-20 NOTE — Telephone Encounter (Signed)
     Primary Cardiologist: Bryan Lemma, MD  Chart reviewed as part of pre-operative protocol coverage. Given past medical history and time since last visit, based on ACC/AHA guidelines, Jessica Landry would be at acceptable risk for the planned procedure without further cardiovascular testing.   Her RCRI is moderate risk, 6.6% risk of major cardiac event.  Her aspirin may be held for 5 to 7 days prior to her surgery.  Please resume as soon as hemostasis is achieved.  I will route this recommendation to the requesting party via Epic fax function and remove from pre-op pool.  Please call with questions.  Thomasene Ripple. Johnthan Axtman NP-C     06/20/2023, 12:37 PM [facility] [address] Office [phone] Fax [phone]

## 2023-06-23 ENCOUNTER — Emergency Department
Admission: EM | Admit: 2023-06-23 | Discharge: 2023-06-23 | Disposition: A | Discharge status: Home / Self Care | Attending: Emergency Medicine | Admitting: Emergency Medicine

## 2023-06-23 ENCOUNTER — Emergency Department

## 2023-06-23 ENCOUNTER — Other Ambulatory Visit: Payer: Self-pay

## 2023-06-23 DIAGNOSIS — W01198A Fall on same level from slipping, tripping and stumbling with subsequent striking against other object, initial encounter: Secondary | ICD-10-CM | POA: Diagnosis not present

## 2023-06-23 DIAGNOSIS — S51812A Laceration without foreign body of left forearm, initial encounter: Secondary | ICD-10-CM | POA: Insufficient documentation

## 2023-06-23 DIAGNOSIS — W19XXXA Unspecified fall, initial encounter: Secondary | ICD-10-CM

## 2023-06-23 DIAGNOSIS — S0083XA Contusion of other part of head, initial encounter: Secondary | ICD-10-CM | POA: Insufficient documentation

## 2023-06-23 DIAGNOSIS — S0990XA Unspecified injury of head, initial encounter: Secondary | ICD-10-CM | POA: Diagnosis not present

## 2023-06-23 DIAGNOSIS — I6782 Cerebral ischemia: Secondary | ICD-10-CM | POA: Diagnosis not present

## 2023-06-23 DIAGNOSIS — S0003XA Contusion of scalp, initial encounter: Secondary | ICD-10-CM | POA: Diagnosis not present

## 2023-06-23 MED ORDER — ACETAMINOPHEN 325 MG PO TABS
650.0000 mg | ORAL_TABLET | Freq: Once | ORAL | Status: AC
  Administered 2023-06-23: 650 mg via ORAL
  Filled 2023-06-23: qty 2

## 2023-06-23 NOTE — ED Provider Notes (Signed)
 [facility] Provider Note    Event Date/Time   First MD Initiated Contact with Patient 06/23/23 1142     (approximate)   History   Fall and skin tear   HPI  Jessica Landry is a 68 y.o. female who presents after a mechanical fall, patient lost her balance and fell hitting the left side of her forehead as well as suffering a skin tear to her left forearm.  No other injuries reported.  No neurodeficits.  No chest pain, no abdominal pain, no back pain     Physical Exam   Triage Vital Signs: ED Triage Vitals  Encounter Vitals Group     BP 06/23/23 1100 133/74     Systolic BP Percentile --      Diastolic BP Percentile --      Pulse Rate 06/23/23 1100 65     Resp 06/23/23 1100 20     Temp 06/23/23 1105 98.4 F (36.9 C)     Temp Source 06/23/23 1105 Oral     SpO2 06/23/23 1100 99 %     Weight 06/23/23 1059 66.2 kg (146 lb)     Height 06/23/23 1059 1.702 m (5\' 7" )     Head Circumference --      Peak Flow --      Pain Score 06/23/23 1059 0     Pain Loc --      Pain Education --      Exclude from Growth Chart --     Most recent vital signs: Vitals:   06/23/23 1100 06/23/23 1105  BP: 133/74   Pulse: 65   Resp: 20   Temp:  98.4 F (36.9 C)  SpO2: 99%      General: Awake, no distress.  CV:  Good peripheral perfusion.  Resp:  Normal effort.  Abd:  No distention.  Other:  Large skin tear to the left forearm   ED Results / Procedures / Treatments   Labs (all labs ordered are listed, but only abnormal results are displayed) Labs Reviewed - No data to display   EKG     RADIOLOGY CT head viewed interpret by me, no acute abnormality, confirmed by radiology    PROCEDURES:  Critical Care performed:   Procedures   MEDICATIONS ORDERED IN ED: Medications  acetaminophen (TYLENOL) tablet 650 mg (650 mg Oral Given 06/23/23 1229)     IMPRESSION / MDM / ASSESSMENT AND PLAN / ED COURSE  I reviewed the triage vital signs and the  nursing notes. Patient's presentation is most consistent with acute presentation with potential threat to life or bodily function.  Patient presents for fall with head injury.  She has hematoma to the left forehead.  She does take aspirin.  She also has a skin tear to the left forearm, otherwise exam is reassuring  Will send to CT head to rule out intracranial hemorrhage, skull fracture  Dressing applied to skin tear and instructions for daily dressing changes  CT scan is negative for ICH or fracture, appropriate for discharge, patient agrees with this plan        FINAL CLINICAL IMPRESSION(S) / ED DIAGNOSES   Final diagnoses:  Fall, initial encounter  Injury of head, initial encounter  Skin tear of left forearm without complication, initial encounter     Rx / DC Orders   ED Discharge Orders     None        Note:  This document was prepared using Dragon voice  recognition software and may include unintentional dictation errors.   Jene Every, MD 06/23/23 [phone]

## 2023-06-23 NOTE — ED Notes (Signed)
 68 yof sitting upright on the side of the bed. The pt is warm, pink, and dry. The pt is alert and oriented x 4. The pt advised this morning she was going to the bathroom when she tripped going into the bathroom. The pt does have a egg size hematoma to the left side of her forehead above her left eyebrow. The pt also has a large dressing to her left forearm. The pt stated she has a large skin tear underneath the guaze. Bleeding controlled at this time.

## 2023-06-23 NOTE — ED Triage Notes (Signed)
 Pt to ED for mechanical fall today and large skin tear/skin avulsion to L arm (upper and lower). Also has hematoma to L forehead. No LOC. Pt takes aspirin 81mg  every other day, no other blood thinners.  Placed vaseline gauze over skin tear.  Pt denies having legal guardian but is listed as having one. Pt lives at home with husband and is mentally competent.   Last Tdap was 5-10 years ago.

## 2023-06-26 DIAGNOSIS — M19012 Primary osteoarthritis, left shoulder: Secondary | ICD-10-CM | POA: Diagnosis not present

## 2023-06-28 DIAGNOSIS — I129 Hypertensive chronic kidney disease with stage 1 through stage 4 chronic kidney disease, or unspecified chronic kidney disease: Secondary | ICD-10-CM | POA: Diagnosis not present

## 2023-06-28 DIAGNOSIS — I25119 Atherosclerotic heart disease of native coronary artery with unspecified angina pectoris: Secondary | ICD-10-CM | POA: Diagnosis not present

## 2023-06-28 DIAGNOSIS — N183 Chronic kidney disease, stage 3 unspecified: Secondary | ICD-10-CM | POA: Diagnosis not present

## 2023-07-05 DIAGNOSIS — M5416 Radiculopathy, lumbar region: Secondary | ICD-10-CM | POA: Diagnosis not present

## 2023-07-09 DIAGNOSIS — E785 Hyperlipidemia, unspecified: Secondary | ICD-10-CM | POA: Diagnosis not present

## 2023-07-09 DIAGNOSIS — I2510 Atherosclerotic heart disease of native coronary artery without angina pectoris: Secondary | ICD-10-CM | POA: Diagnosis not present

## 2023-07-09 DIAGNOSIS — N183 Chronic kidney disease, stage 3 unspecified: Secondary | ICD-10-CM | POA: Diagnosis not present

## 2023-07-09 DIAGNOSIS — Z0000 Encounter for general adult medical examination without abnormal findings: Secondary | ICD-10-CM | POA: Diagnosis not present

## 2023-07-09 DIAGNOSIS — I130 Hypertensive heart and chronic kidney disease with heart failure and stage 1 through stage 4 chronic kidney disease, or unspecified chronic kidney disease: Secondary | ICD-10-CM | POA: Diagnosis not present

## 2023-07-09 DIAGNOSIS — I129 Hypertensive chronic kidney disease with stage 1 through stage 4 chronic kidney disease, or unspecified chronic kidney disease: Secondary | ICD-10-CM | POA: Diagnosis not present

## 2023-07-09 DIAGNOSIS — F325 Major depressive disorder, single episode, in full remission: Secondary | ICD-10-CM | POA: Diagnosis not present

## 2023-07-09 DIAGNOSIS — I509 Heart failure, unspecified: Secondary | ICD-10-CM | POA: Diagnosis not present

## 2023-07-24 DIAGNOSIS — M19012 Primary osteoarthritis, left shoulder: Secondary | ICD-10-CM | POA: Diagnosis not present

## 2023-08-09 DIAGNOSIS — M545 Low back pain, unspecified: Secondary | ICD-10-CM | POA: Diagnosis not present

## 2023-08-09 DIAGNOSIS — M5416 Radiculopathy, lumbar region: Secondary | ICD-10-CM | POA: Diagnosis not present

## 2023-08-15 ENCOUNTER — Other Ambulatory Visit: Payer: Self-pay | Admitting: Orthopedic Surgery

## 2023-08-15 DIAGNOSIS — M545 Low back pain, unspecified: Secondary | ICD-10-CM

## 2023-08-19 ENCOUNTER — Other Ambulatory Visit

## 2023-08-20 ENCOUNTER — Other Ambulatory Visit

## 2023-08-20 ENCOUNTER — Ambulatory Visit
Admission: RE | Admit: 2023-08-20 | Discharge: 2023-08-20 | Disposition: A | Discharge status: Home / Self Care | Source: Ambulatory Visit | Attending: Orthopedic Surgery | Admitting: Orthopedic Surgery

## 2023-08-20 DIAGNOSIS — M545 Low back pain, unspecified: Secondary | ICD-10-CM

## 2023-08-20 DIAGNOSIS — Z981 Arthrodesis status: Secondary | ICD-10-CM | POA: Diagnosis not present

## 2023-08-20 DIAGNOSIS — M5416 Radiculopathy, lumbar region: Secondary | ICD-10-CM | POA: Diagnosis not present

## 2023-08-20 DIAGNOSIS — M48061 Spinal stenosis, lumbar region without neurogenic claudication: Secondary | ICD-10-CM | POA: Diagnosis not present

## 2023-08-23 DIAGNOSIS — M545 Low back pain, unspecified: Secondary | ICD-10-CM | POA: Diagnosis not present

## 2023-08-26 ENCOUNTER — Other Ambulatory Visit: Payer: Self-pay | Admitting: Orthopedic Surgery

## 2023-08-26 DIAGNOSIS — M25512 Pain in left shoulder: Secondary | ICD-10-CM | POA: Diagnosis not present

## 2023-08-26 DIAGNOSIS — M19012 Primary osteoarthritis, left shoulder: Secondary | ICD-10-CM | POA: Diagnosis not present

## 2023-08-26 DIAGNOSIS — M5416 Radiculopathy, lumbar region: Secondary | ICD-10-CM

## 2023-08-27 DIAGNOSIS — I2510 Atherosclerotic heart disease of native coronary artery without angina pectoris: Secondary | ICD-10-CM | POA: Diagnosis not present

## 2023-09-04 ENCOUNTER — Ambulatory Visit
Admission: RE | Admit: 2023-09-04 | Discharge: 2023-09-04 | Disposition: A | Discharge status: Home / Self Care | Source: Ambulatory Visit | Attending: Orthopedic Surgery | Admitting: Orthopedic Surgery

## 2023-09-04 DIAGNOSIS — M5416 Radiculopathy, lumbar region: Secondary | ICD-10-CM

## 2023-09-04 DIAGNOSIS — M533 Sacrococcygeal disorders, not elsewhere classified: Secondary | ICD-10-CM | POA: Diagnosis not present

## 2023-09-04 MED ORDER — METHYLPREDNISOLONE ACETATE 40 MG/ML INJ SUSP (RADIOLOG
80.0000 mg | Freq: Once | INTRAMUSCULAR | Status: AC
  Administered 2023-09-04: 80 mg via INTRA_ARTICULAR

## 2023-09-04 MED ORDER — IOPAMIDOL (ISOVUE-M 200) INJECTION 41%
1.0000 mL | Freq: Once | INTRAMUSCULAR | Status: AC | PRN
Start: 2023-09-04 — End: 2023-09-04
  Administered 2023-09-04: 1 mL

## 2023-09-05 DIAGNOSIS — M19012 Primary osteoarthritis, left shoulder: Secondary | ICD-10-CM | POA: Diagnosis not present

## 2023-09-05 DIAGNOSIS — M6281 Muscle weakness (generalized): Secondary | ICD-10-CM | POA: Diagnosis not present

## 2023-09-05 DIAGNOSIS — M25612 Stiffness of left shoulder, not elsewhere classified: Secondary | ICD-10-CM | POA: Diagnosis not present

## 2023-09-12 DIAGNOSIS — M25712 Osteophyte, left shoulder: Secondary | ICD-10-CM | POA: Diagnosis not present

## 2023-09-12 DIAGNOSIS — G8918 Other acute postprocedural pain: Secondary | ICD-10-CM | POA: Diagnosis not present

## 2023-09-12 DIAGNOSIS — M19012 Primary osteoarthritis, left shoulder: Secondary | ICD-10-CM | POA: Diagnosis not present

## 2023-09-16 DIAGNOSIS — M25612 Stiffness of left shoulder, not elsewhere classified: Secondary | ICD-10-CM | POA: Diagnosis not present

## 2023-09-16 DIAGNOSIS — M6281 Muscle weakness (generalized): Secondary | ICD-10-CM | POA: Diagnosis not present

## 2023-09-16 DIAGNOSIS — M19012 Primary osteoarthritis, left shoulder: Secondary | ICD-10-CM | POA: Diagnosis not present

## 2023-09-19 DIAGNOSIS — M6281 Muscle weakness (generalized): Secondary | ICD-10-CM | POA: Diagnosis not present

## 2023-09-19 DIAGNOSIS — M25612 Stiffness of left shoulder, not elsewhere classified: Secondary | ICD-10-CM | POA: Diagnosis not present

## 2023-09-19 DIAGNOSIS — M19012 Primary osteoarthritis, left shoulder: Secondary | ICD-10-CM | POA: Diagnosis not present

## 2023-09-20 DIAGNOSIS — M545 Low back pain, unspecified: Secondary | ICD-10-CM | POA: Diagnosis not present

## 2023-09-24 DIAGNOSIS — M6281 Muscle weakness (generalized): Secondary | ICD-10-CM | POA: Diagnosis not present

## 2023-09-24 DIAGNOSIS — M19012 Primary osteoarthritis, left shoulder: Secondary | ICD-10-CM | POA: Diagnosis not present

## 2023-09-24 DIAGNOSIS — M25612 Stiffness of left shoulder, not elsewhere classified: Secondary | ICD-10-CM | POA: Diagnosis not present

## 2023-09-25 DIAGNOSIS — M19012 Primary osteoarthritis, left shoulder: Secondary | ICD-10-CM | POA: Diagnosis not present

## 2023-10-03 DIAGNOSIS — M6281 Muscle weakness (generalized): Secondary | ICD-10-CM | POA: Diagnosis not present

## 2023-10-03 DIAGNOSIS — M19012 Primary osteoarthritis, left shoulder: Secondary | ICD-10-CM | POA: Diagnosis not present

## 2023-10-03 DIAGNOSIS — M25612 Stiffness of left shoulder, not elsewhere classified: Secondary | ICD-10-CM | POA: Diagnosis not present

## 2023-10-08 DIAGNOSIS — M6281 Muscle weakness (generalized): Secondary | ICD-10-CM | POA: Diagnosis not present

## 2023-10-08 DIAGNOSIS — M25612 Stiffness of left shoulder, not elsewhere classified: Secondary | ICD-10-CM | POA: Diagnosis not present

## 2023-10-08 DIAGNOSIS — M19012 Primary osteoarthritis, left shoulder: Secondary | ICD-10-CM | POA: Diagnosis not present

## 2023-10-10 DIAGNOSIS — M6281 Muscle weakness (generalized): Secondary | ICD-10-CM | POA: Diagnosis not present

## 2023-10-10 DIAGNOSIS — M25612 Stiffness of left shoulder, not elsewhere classified: Secondary | ICD-10-CM | POA: Diagnosis not present

## 2023-10-10 DIAGNOSIS — M19012 Primary osteoarthritis, left shoulder: Secondary | ICD-10-CM | POA: Diagnosis not present

## 2023-10-12 ENCOUNTER — Other Ambulatory Visit: Payer: Self-pay | Admitting: Cardiology

## 2023-10-23 DIAGNOSIS — M19012 Primary osteoarthritis, left shoulder: Secondary | ICD-10-CM | POA: Diagnosis not present

## 2023-11-04 ENCOUNTER — Other Ambulatory Visit: Payer: Self-pay | Admitting: Cardiology

## 2023-11-04 DIAGNOSIS — M25512 Pain in left shoulder: Secondary | ICD-10-CM | POA: Diagnosis not present

## 2023-11-04 DIAGNOSIS — Z96612 Presence of left artificial shoulder joint: Secondary | ICD-10-CM | POA: Diagnosis not present

## 2023-11-06 DIAGNOSIS — Z96612 Presence of left artificial shoulder joint: Secondary | ICD-10-CM | POA: Diagnosis not present

## 2023-11-06 DIAGNOSIS — M25512 Pain in left shoulder: Secondary | ICD-10-CM | POA: Diagnosis not present

## 2023-11-13 DIAGNOSIS — M25512 Pain in left shoulder: Secondary | ICD-10-CM | POA: Diagnosis not present

## 2023-11-13 DIAGNOSIS — Z96612 Presence of left artificial shoulder joint: Secondary | ICD-10-CM | POA: Diagnosis not present

## 2023-11-15 DIAGNOSIS — Z96612 Presence of left artificial shoulder joint: Secondary | ICD-10-CM | POA: Diagnosis not present

## 2023-11-15 DIAGNOSIS — M25512 Pain in left shoulder: Secondary | ICD-10-CM | POA: Diagnosis not present

## 2023-11-19 DIAGNOSIS — M25512 Pain in left shoulder: Secondary | ICD-10-CM | POA: Diagnosis not present

## 2023-11-19 DIAGNOSIS — Z96612 Presence of left artificial shoulder joint: Secondary | ICD-10-CM | POA: Diagnosis not present

## 2023-11-21 DIAGNOSIS — M25512 Pain in left shoulder: Secondary | ICD-10-CM | POA: Diagnosis not present

## 2023-11-21 DIAGNOSIS — Z96612 Presence of left artificial shoulder joint: Secondary | ICD-10-CM | POA: Diagnosis not present

## 2023-11-27 DIAGNOSIS — Z96612 Presence of left artificial shoulder joint: Secondary | ICD-10-CM | POA: Diagnosis not present

## 2023-11-27 DIAGNOSIS — M25512 Pain in left shoulder: Secondary | ICD-10-CM | POA: Diagnosis not present

## 2023-11-29 DIAGNOSIS — M25512 Pain in left shoulder: Secondary | ICD-10-CM | POA: Diagnosis not present

## 2023-11-29 DIAGNOSIS — Z96612 Presence of left artificial shoulder joint: Secondary | ICD-10-CM | POA: Diagnosis not present

## 2023-12-02 DIAGNOSIS — Z96612 Presence of left artificial shoulder joint: Secondary | ICD-10-CM | POA: Diagnosis not present

## 2023-12-02 DIAGNOSIS — M25512 Pain in left shoulder: Secondary | ICD-10-CM | POA: Diagnosis not present

## 2023-12-04 DIAGNOSIS — Z96612 Presence of left artificial shoulder joint: Secondary | ICD-10-CM | POA: Diagnosis not present

## 2023-12-04 DIAGNOSIS — M25512 Pain in left shoulder: Secondary | ICD-10-CM | POA: Diagnosis not present

## 2023-12-12 DIAGNOSIS — Z96612 Presence of left artificial shoulder joint: Secondary | ICD-10-CM | POA: Diagnosis not present

## 2023-12-12 DIAGNOSIS — M25512 Pain in left shoulder: Secondary | ICD-10-CM | POA: Diagnosis not present

## 2023-12-16 DIAGNOSIS — M25512 Pain in left shoulder: Secondary | ICD-10-CM | POA: Diagnosis not present

## 2023-12-16 DIAGNOSIS — Z96612 Presence of left artificial shoulder joint: Secondary | ICD-10-CM | POA: Diagnosis not present

## 2023-12-18 DIAGNOSIS — M25512 Pain in left shoulder: Secondary | ICD-10-CM | POA: Diagnosis not present

## 2023-12-18 DIAGNOSIS — Z96612 Presence of left artificial shoulder joint: Secondary | ICD-10-CM | POA: Diagnosis not present

## 2023-12-20 DIAGNOSIS — M545 Low back pain, unspecified: Secondary | ICD-10-CM | POA: Diagnosis not present

## 2023-12-20 DIAGNOSIS — M533 Sacrococcygeal disorders, not elsewhere classified: Secondary | ICD-10-CM | POA: Diagnosis not present

## 2023-12-23 DIAGNOSIS — Z96612 Presence of left artificial shoulder joint: Secondary | ICD-10-CM | POA: Diagnosis not present

## 2023-12-23 DIAGNOSIS — M25512 Pain in left shoulder: Secondary | ICD-10-CM | POA: Diagnosis not present

## 2023-12-25 DIAGNOSIS — Z96612 Presence of left artificial shoulder joint: Secondary | ICD-10-CM | POA: Diagnosis not present

## 2023-12-25 DIAGNOSIS — M25512 Pain in left shoulder: Secondary | ICD-10-CM | POA: Diagnosis not present

## 2023-12-26 ENCOUNTER — Other Ambulatory Visit: Payer: Self-pay | Admitting: Orthopedic Surgery

## 2023-12-26 DIAGNOSIS — M5416 Radiculopathy, lumbar region: Secondary | ICD-10-CM

## 2023-12-30 DIAGNOSIS — M25512 Pain in left shoulder: Secondary | ICD-10-CM | POA: Diagnosis not present

## 2023-12-30 DIAGNOSIS — Z96612 Presence of left artificial shoulder joint: Secondary | ICD-10-CM | POA: Diagnosis not present

## 2024-01-01 DIAGNOSIS — N183 Chronic kidney disease, stage 3 unspecified: Secondary | ICD-10-CM | POA: Diagnosis not present

## 2024-01-01 DIAGNOSIS — I25119 Atherosclerotic heart disease of native coronary artery with unspecified angina pectoris: Secondary | ICD-10-CM | POA: Diagnosis not present

## 2024-01-01 DIAGNOSIS — I129 Hypertensive chronic kidney disease with stage 1 through stage 4 chronic kidney disease, or unspecified chronic kidney disease: Secondary | ICD-10-CM | POA: Diagnosis not present

## 2024-01-08 DIAGNOSIS — R799 Abnormal finding of blood chemistry, unspecified: Secondary | ICD-10-CM | POA: Diagnosis not present

## 2024-01-08 DIAGNOSIS — I2510 Atherosclerotic heart disease of native coronary artery without angina pectoris: Secondary | ICD-10-CM | POA: Diagnosis not present

## 2024-01-08 DIAGNOSIS — I129 Hypertensive chronic kidney disease with stage 1 through stage 4 chronic kidney disease, or unspecified chronic kidney disease: Secondary | ICD-10-CM | POA: Diagnosis not present

## 2024-01-08 DIAGNOSIS — E785 Hyperlipidemia, unspecified: Secondary | ICD-10-CM | POA: Diagnosis not present

## 2024-01-08 DIAGNOSIS — N183 Chronic kidney disease, stage 3 unspecified: Secondary | ICD-10-CM | POA: Diagnosis not present

## 2024-01-10 ENCOUNTER — Other Ambulatory Visit

## 2024-01-15 DIAGNOSIS — Z96612 Presence of left artificial shoulder joint: Secondary | ICD-10-CM | POA: Diagnosis not present

## 2024-01-20 DIAGNOSIS — M533 Sacrococcygeal disorders, not elsewhere classified: Secondary | ICD-10-CM | POA: Diagnosis not present

## 2024-01-20 DIAGNOSIS — M5459 Other low back pain: Secondary | ICD-10-CM | POA: Diagnosis not present

## 2024-01-20 DIAGNOSIS — M259 Joint disorder, unspecified: Secondary | ICD-10-CM | POA: Diagnosis not present

## 2024-01-27 ENCOUNTER — Encounter: Payer: Self-pay | Admitting: Neurosurgery

## 2024-02-05 ENCOUNTER — Inpatient Hospital Stay
Admission: RE | Admit: 2024-02-05 | Discharge: 2024-02-05 | Disposition: A | Source: Ambulatory Visit | Attending: Orthopedic Surgery | Admitting: Orthopedic Surgery

## 2024-02-05 DIAGNOSIS — M5416 Radiculopathy, lumbar region: Secondary | ICD-10-CM

## 2024-02-05 DIAGNOSIS — M533 Sacrococcygeal disorders, not elsewhere classified: Secondary | ICD-10-CM | POA: Diagnosis not present

## 2024-02-18 DIAGNOSIS — M533 Sacrococcygeal disorders, not elsewhere classified: Secondary | ICD-10-CM | POA: Diagnosis not present

## 2024-02-26 ENCOUNTER — Other Ambulatory Visit (HOSPITAL_COMMUNITY): Payer: Self-pay

## 2024-02-27 DIAGNOSIS — M5416 Radiculopathy, lumbar region: Secondary | ICD-10-CM | POA: Diagnosis not present

## 2024-03-02 DIAGNOSIS — M5416 Radiculopathy, lumbar region: Secondary | ICD-10-CM | POA: Diagnosis not present

## 2024-03-04 DIAGNOSIS — M5416 Radiculopathy, lumbar region: Secondary | ICD-10-CM | POA: Diagnosis not present

## 2024-03-05 ENCOUNTER — Other Ambulatory Visit: Payer: Self-pay | Admitting: Cardiology

## 2024-03-09 DIAGNOSIS — M5416 Radiculopathy, lumbar region: Secondary | ICD-10-CM | POA: Diagnosis not present

## 2024-03-11 DIAGNOSIS — M5416 Radiculopathy, lumbar region: Secondary | ICD-10-CM | POA: Diagnosis not present

## 2024-03-16 DIAGNOSIS — M5416 Radiculopathy, lumbar region: Secondary | ICD-10-CM | POA: Diagnosis not present

## 2024-03-18 DIAGNOSIS — M5416 Radiculopathy, lumbar region: Secondary | ICD-10-CM | POA: Diagnosis not present

## 2024-06-08 ENCOUNTER — Ambulatory Visit: Admitting: Cardiology
